# Patient Record
Sex: Female | Born: 1970
Health system: Southern US, Community
[De-identification: ages and names within clinical notes are randomized; demographics above are authoritative.]

## PROBLEM LIST (undated history)

## (undated) DIAGNOSIS — J411 Mucopurulent chronic bronchitis: Secondary | ICD-10-CM

## (undated) DIAGNOSIS — E282 Polycystic ovarian syndrome: Secondary | ICD-10-CM

## (undated) DIAGNOSIS — E785 Hyperlipidemia, unspecified: Secondary | ICD-10-CM

## (undated) DIAGNOSIS — K219 Gastro-esophageal reflux disease without esophagitis: Secondary | ICD-10-CM

## (undated) DIAGNOSIS — S82899A Other fracture of unspecified lower leg, initial encounter for closed fracture: Secondary | ICD-10-CM

## (undated) DIAGNOSIS — D649 Anemia, unspecified: Secondary | ICD-10-CM

## (undated) DIAGNOSIS — Z872 Personal history of diseases of the skin and subcutaneous tissue: Secondary | ICD-10-CM

## (undated) DIAGNOSIS — F419 Anxiety disorder, unspecified: Secondary | ICD-10-CM

## (undated) DIAGNOSIS — L299 Pruritus, unspecified: Secondary | ICD-10-CM

## (undated) DIAGNOSIS — T7840XA Allergy, unspecified, initial encounter: Secondary | ICD-10-CM

## (undated) DIAGNOSIS — K589 Irritable bowel syndrome without diarrhea: Secondary | ICD-10-CM

## (undated) DIAGNOSIS — M8580 Other specified disorders of bone density and structure, unspecified site: Secondary | ICD-10-CM

## (undated) DIAGNOSIS — N159 Renal tubulo-interstitial disease, unspecified: Secondary | ICD-10-CM

## (undated) DIAGNOSIS — M545 Low back pain: Secondary | ICD-10-CM

## (undated) DIAGNOSIS — J45909 Unspecified asthma, uncomplicated: Secondary | ICD-10-CM

## (undated) DIAGNOSIS — F32A Depression, unspecified: Secondary | ICD-10-CM

## (undated) DIAGNOSIS — Z124 Encounter for screening for malignant neoplasm of cervix: Principal | ICD-10-CM

## (undated) DIAGNOSIS — K649 Unspecified hemorrhoids: Secondary | ICD-10-CM

## (undated) DIAGNOSIS — F329 Major depressive disorder, single episode, unspecified: Secondary | ICD-10-CM

## (undated) DIAGNOSIS — J189 Pneumonia, unspecified organism: Secondary | ICD-10-CM

## (undated) DIAGNOSIS — L309 Dermatitis, unspecified: Secondary | ICD-10-CM

## (undated) HISTORY — DX: Anemia, unspecified: D64.9

## (undated) HISTORY — DX: Unspecified hemorrhoids: K64.9

## (undated) HISTORY — DX: Hyperlipidemia, unspecified: E78.5

## (undated) HISTORY — DX: Other specified disorders of bone density and structure, unspecified site: M85.80

## (undated) HISTORY — DX: Pruritus, unspecified: L29.9

## (undated) HISTORY — DX: Dermatitis, unspecified: L30.9

## (undated) HISTORY — DX: Irritable bowel syndrome, unspecified: K58.9

## (undated) HISTORY — PX: WISDOM TOOTH EXTRACTION: SHX21

## (undated) HISTORY — DX: Allergy, unspecified, initial encounter: T78.40XA

## (undated) HISTORY — DX: Renal tubulo-interstitial disease, unspecified: N15.9

## (undated) HISTORY — DX: Gastro-esophageal reflux disease without esophagitis: K21.9

## (undated) HISTORY — DX: Anxiety disorder, unspecified: F41.9

## (undated) HISTORY — DX: Other fracture of unspecified lower leg, initial encounter for closed fracture: S82.899A

## (undated) HISTORY — PX: HEMORRHOID SURGERY: SHX153

## (undated) HISTORY — DX: Low back pain: M54.5

## (undated) HISTORY — DX: Major depressive disorder, single episode, unspecified: F32.9

## (undated) HISTORY — PX: ANKLE SURGERY: SHX546

## (undated) HISTORY — DX: Polycystic ovarian syndrome: E28.2

## (undated) HISTORY — DX: Mucopurulent chronic bronchitis: J41.1

## (undated) HISTORY — DX: Unspecified asthma, uncomplicated: J45.909

## (undated) HISTORY — PX: EYE SURGERY: SHX253

## (undated) HISTORY — DX: Personal history of diseases of the skin and subcutaneous tissue: Z87.2

## (undated) HISTORY — DX: Encounter for screening for malignant neoplasm of cervix: Z12.4

## (undated) HISTORY — DX: Depression, unspecified: F32.A

---
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
## 2002-09-16 ENCOUNTER — Other Ambulatory Visit: Admission: RE | Admit: 2002-09-16 | Discharge: 2002-09-16 | Payer: Self-pay | Admitting: Gynecology

## 2003-10-05 ENCOUNTER — Other Ambulatory Visit: Admission: RE | Admit: 2003-10-05 | Discharge: 2003-10-05 | Payer: Self-pay | Admitting: Obstetrics and Gynecology

## 2004-10-10 ENCOUNTER — Other Ambulatory Visit: Admission: RE | Admit: 2004-10-10 | Discharge: 2004-10-10 | Payer: Self-pay | Admitting: Obstetrics and Gynecology

## 2004-12-13 ENCOUNTER — Encounter: Admission: RE | Admit: 2004-12-13 | Discharge: 2004-12-13 | Payer: Self-pay | Admitting: Family Medicine

## 2006-04-03 ENCOUNTER — Other Ambulatory Visit: Admission: RE | Admit: 2006-04-03 | Discharge: 2006-04-03 | Payer: Self-pay | Admitting: Obstetrics and Gynecology

## 2007-03-01 LAB — HM MAMMOGRAPHY: HM Mammogram: NORMAL

## 2007-04-25 ENCOUNTER — Other Ambulatory Visit: Admission: RE | Admit: 2007-04-25 | Discharge: 2007-04-25 | Payer: Self-pay | Admitting: Gynecology

## 2008-05-07 ENCOUNTER — Other Ambulatory Visit: Admission: RE | Admit: 2008-05-07 | Discharge: 2008-05-07 | Payer: Self-pay | Admitting: Obstetrics and Gynecology

## 2008-05-21 LAB — CONVERTED CEMR LAB: Pap Smear: NORMAL

## 2009-02-17 ENCOUNTER — Ambulatory Visit: Payer: Self-pay | Admitting: *Deleted

## 2009-02-17 DIAGNOSIS — K649 Unspecified hemorrhoids: Secondary | ICD-10-CM | POA: Insufficient documentation

## 2009-02-17 DIAGNOSIS — L732 Hidradenitis suppurativa: Secondary | ICD-10-CM

## 2009-02-17 DIAGNOSIS — J309 Allergic rhinitis, unspecified: Secondary | ICD-10-CM

## 2009-02-17 DIAGNOSIS — F419 Anxiety disorder, unspecified: Secondary | ICD-10-CM | POA: Insufficient documentation

## 2009-02-17 DIAGNOSIS — H1045 Other chronic allergic conjunctivitis: Secondary | ICD-10-CM

## 2009-02-17 DIAGNOSIS — E782 Mixed hyperlipidemia: Secondary | ICD-10-CM

## 2009-02-17 DIAGNOSIS — J3089 Other allergic rhinitis: Secondary | ICD-10-CM | POA: Insufficient documentation

## 2009-02-17 DIAGNOSIS — F329 Major depressive disorder, single episode, unspecified: Secondary | ICD-10-CM

## 2009-02-17 DIAGNOSIS — H1010 Acute atopic conjunctivitis, unspecified eye: Secondary | ICD-10-CM | POA: Insufficient documentation

## 2009-02-17 DIAGNOSIS — K589 Irritable bowel syndrome without diarrhea: Secondary | ICD-10-CM

## 2009-02-17 DIAGNOSIS — J40 Bronchitis, not specified as acute or chronic: Secondary | ICD-10-CM

## 2009-02-17 DIAGNOSIS — F32A Depression, unspecified: Secondary | ICD-10-CM

## 2009-02-17 HISTORY — DX: Anxiety disorder, unspecified: F41.9

## 2009-02-17 HISTORY — DX: Depression, unspecified: F32.A

## 2009-03-18 ENCOUNTER — Ambulatory Visit: Payer: Self-pay | Admitting: *Deleted

## 2009-03-18 LAB — CONVERTED CEMR LAB
AST: 24 units/L (ref 0–37)
Albumin: 3.9 g/dL (ref 3.5–5.2)
Alkaline Phosphatase: 55 units/L (ref 39–117)
BUN: 13 mg/dL (ref 6–23)
Basophils Relative: 0.3 % (ref 0.0–3.0)
Chloride: 107 meq/L (ref 96–112)
Cholesterol: 182 mg/dL (ref 0–200)
Creatinine, Ser: 0.6 mg/dL (ref 0.4–1.2)
Eosinophils Absolute: 0.1 10*3/uL (ref 0.0–0.7)
Eosinophils Relative: 2.6 % (ref 0.0–5.0)
GFR calc non Af Amer: 117.65 mL/min (ref >60)
Glucose, Bld: 78 mg/dL (ref 70–99)
HCT: 37.1 % (ref 36.0–46.0)
HDL: 63.8 mg/dL (ref >39.00)
Hemoglobin: 12.6 g/dL (ref 12.0–15.0)
LDL Cholesterol: 109 mg/dL — ABNORMAL HIGH (ref 0–99)
Lymphs Abs: 1.2 10*3/uL (ref 0.7–4.0)
MCHC: 33.9 g/dL (ref 30.0–36.0)
MCV: 88.7 fL (ref 78.0–100.0)
Monocytes Absolute: 0.3 10*3/uL (ref 0.1–1.0)
Neutro Abs: 3.5 10*3/uL (ref 1.4–7.7)
Neutrophils Relative %: 68.1 % (ref 43.0–77.0)
Platelets: 261 10*3/uL (ref 150.0–400.0)
Potassium: 4.2 meq/L (ref 3.5–5.1)
RBC: 4.18 M/uL (ref 3.87–5.11)
RDW: 12.7 % (ref 11.5–14.6)
Sodium: 139 meq/L (ref 135–145)
TSH: 1.77 microintl units/mL (ref 0.35–5.50)
Total Bilirubin: 0.7 mg/dL (ref 0.3–1.2)
Total CHOL/HDL Ratio: 3
Total Protein: 7.3 g/dL (ref 6.0–8.3)
Triglycerides: 47 mg/dL (ref 0.0–149.0)
VLDL: 9.4 mg/dL (ref 0.0–40.0)
WBC: 5.1 10*3/uL (ref 4.5–10.5)

## 2009-03-30 ENCOUNTER — Telehealth (INDEPENDENT_AMBULATORY_CARE_PROVIDER_SITE_OTHER): Payer: Self-pay | Admitting: *Deleted

## 2009-04-15 ENCOUNTER — Telehealth: Payer: Self-pay | Admitting: Internal Medicine

## 2009-04-15 ENCOUNTER — Ambulatory Visit: Payer: Self-pay | Admitting: Internal Medicine

## 2009-04-15 DIAGNOSIS — H1010 Acute atopic conjunctivitis, unspecified eye: Secondary | ICD-10-CM | POA: Insufficient documentation

## 2009-04-15 DIAGNOSIS — H109 Unspecified conjunctivitis: Secondary | ICD-10-CM

## 2009-04-15 DIAGNOSIS — H6590 Unspecified nonsuppurative otitis media, unspecified ear: Secondary | ICD-10-CM | POA: Insufficient documentation

## 2009-04-25 ENCOUNTER — Telehealth: Payer: Self-pay | Admitting: Internal Medicine

## 2009-04-28 ENCOUNTER — Encounter: Payer: Self-pay | Admitting: Internal Medicine

## 2009-05-20 LAB — CONVERTED CEMR LAB: Pap Smear: NORMAL

## 2009-09-19 ENCOUNTER — Ambulatory Visit: Payer: Self-pay | Admitting: Internal Medicine

## 2009-12-24 DIAGNOSIS — S82899A Other fracture of unspecified lower leg, initial encounter for closed fracture: Secondary | ICD-10-CM

## 2009-12-24 HISTORY — DX: Other fracture of unspecified lower leg, initial encounter for closed fracture: S82.899A

## 2010-03-07 ENCOUNTER — Ambulatory Visit: Payer: Self-pay | Admitting: Internal Medicine

## 2010-03-29 ENCOUNTER — Telehealth: Payer: Self-pay | Admitting: Internal Medicine

## 2010-04-03 ENCOUNTER — Telehealth: Payer: Self-pay | Admitting: Internal Medicine

## 2010-04-07 ENCOUNTER — Ambulatory Visit: Payer: Self-pay | Admitting: Internal Medicine

## 2010-04-07 DIAGNOSIS — S82899A Other fracture of unspecified lower leg, initial encounter for closed fracture: Secondary | ICD-10-CM | POA: Insufficient documentation

## 2010-04-10 ENCOUNTER — Telehealth: Payer: Self-pay | Admitting: Internal Medicine

## 2010-04-11 ENCOUNTER — Encounter: Payer: Self-pay | Admitting: Internal Medicine

## 2010-04-11 ENCOUNTER — Ambulatory Visit: Payer: Self-pay | Admitting: Internal Medicine

## 2010-04-20 ENCOUNTER — Telehealth: Payer: Self-pay | Admitting: Internal Medicine

## 2010-05-23 LAB — CONVERTED CEMR LAB: Pap Smear: NORMAL

## 2010-11-20 ENCOUNTER — Ambulatory Visit (HOSPITAL_BASED_OUTPATIENT_CLINIC_OR_DEPARTMENT_OTHER): Admission: RE | Admit: 2010-11-20 | Discharge: 2010-11-20 | Payer: Self-pay | Admitting: Internal Medicine

## 2010-11-20 ENCOUNTER — Ambulatory Visit: Payer: Self-pay | Admitting: Internal Medicine

## 2010-11-20 ENCOUNTER — Ambulatory Visit: Payer: Self-pay | Admitting: Radiology

## 2010-12-05 ENCOUNTER — Telehealth (INDEPENDENT_AMBULATORY_CARE_PROVIDER_SITE_OTHER): Payer: Self-pay | Admitting: *Deleted

## 2010-12-05 ENCOUNTER — Telehealth: Payer: Self-pay | Admitting: Internal Medicine

## 2010-12-07 ENCOUNTER — Ambulatory Visit: Payer: Self-pay | Admitting: Internal Medicine

## 2011-01-19 ENCOUNTER — Encounter: Payer: Self-pay | Admitting: Internal Medicine

## 2011-01-21 LAB — CONVERTED CEMR LAB
CO2: 24 meq/L (ref 19–32)
Calcium, Total (PTH): 9.7 mg/dL (ref 8.4–10.5)
Calcium: 9.7 mg/dL (ref 8.4–10.5)
Creatinine, Ser: 0.67 mg/dL (ref 0.40–1.20)
Glucose, Bld: 73 mg/dL (ref 70–99)
PTH: 29.6 pg/mL (ref 14.0–72.0)
Potassium: 4.2 meq/L (ref 3.5–5.3)
Sodium: 139 meq/L (ref 135–145)
TSH: 2.117 microintl units/mL (ref 0.350–4.500)
Vit D, 1,25-Dihydroxy: 28 — ABNORMAL LOW (ref 30–89)

## 2011-01-22 ENCOUNTER — Ambulatory Visit (INDEPENDENT_AMBULATORY_CARE_PROVIDER_SITE_OTHER)
Admission: RE | Admit: 2011-01-22 | Discharge: 2011-01-22 | Payer: Self-pay | Source: Home / Self Care | Attending: Internal Medicine | Admitting: Internal Medicine

## 2011-01-22 ENCOUNTER — Encounter: Payer: Self-pay | Admitting: Internal Medicine

## 2011-01-22 DIAGNOSIS — J45909 Unspecified asthma, uncomplicated: Secondary | ICD-10-CM

## 2011-01-23 NOTE — Assessment & Plan Note (Signed)
Summary: discuss broken ankle/dt   Vital Signs:  Patient profile:   40 year old female Menstrual status:  regular Height:      60.5 inches O2 Sat:      99 % on Room air Temp:     98.0 degrees F oral Pulse rate:   63 / minute Pulse rhythm:   regular Resp:     16 per minute BP sitting:   120 / 68  (left arm) Cuff size:   regular  Vitals Entered By: Mervin Kung CMA (April 07, 2010 2:41 PM)  O2 Flow:  Room air CC: room2   Follow up to discuss broken left ankle. Is Patient Diabetic? No   Primary Care Provider:  Dondra Spry DO  CC:  room2   Follow up to discuss broken left ankle.Marland Kitchen  History of Present Illness: 40 y/o white female for hosp f/u she was seen for L ankle fracture b day party - roller skating, she severely twisted her ankle she was seen by ortho.  she has surgical repair - plate and 8 screws  pt sent to PCP for w/u to rule out osteopenia / osteoporosis   Allergies (verified): No Known Drug Allergies  Past History:  Risk Factors: Exercise: no (02/17/2009)  Past Medical History: Allergic rhinitis allergic conjunctivitis IBS - fiber / diet controlled   sees gyn  hemorrhoids  hx of Hidradenitis suppurativa    recurrent bronchitis  anxiety Hyperlipidemia - diet controlled Left ankle fracture - 03/2010  Past Surgical History: hemorrhoidectomy  2 supernumerary nipples  removed  surgery for Hidradenitis suppurativa   Left ankle surgery - plate and 8 screws  Family History: Grandmother with ovarian cancer Family History of Arthritis Fam hx of Osteoporosis - no  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   Lungs:  normal respiratory effort and normal breath sounds.   Heart:  normal rate, regular rhythm, and no gallop.   Msk:  left ankle in cast   Impression &  Recommendations:  Problem # 1:  FRACTURE, ANKLE (ICD-824.8) 40 y/o with left ankle fx.  non high impact.   arrange dexa scan.  check vit d and intact parathyroid level take calcium and vit d supplement Orders: T-Basic Metabolic Panel [id number]) T-TSH [phone]) T- * Misc. Laboratory test [id number])  Complete Medication List: 1)  Flonase 50 Mcg/act Susp (Fluticasone propionate) .... One spray each nostril every other day. 2)  Multivitamins Caps (Multiple vitamin) .... Take 1 tablet by mouth once a day 3)  Calcium-magnesium 300-300 Mg Tabs (Calcium-magnesium) .... Take two tablets once daily 4)  Paroxetine Hcl 10 Mg Tabs (Paroxetine hcl) .... One tab by mouth once daily 5)  Quercetin 250 Mg Tabs (Quercetin) .... 2 tablets by mouth once daily 6)  Bee Pollen 500 Mg Chew (Bee pollen) .Marland Kitchen.. 18 granules once daily by mouth 7)  Ecotrin 325 Mg Tbec (Aspirin) .... Take 1 tablet by mouth once a day 8)  Percocet 5-325 Mg Tabs (Oxycodone-acetaminophen) .Marland Kitchen.. 1 every six hours as needed 9)  Ibuprofen 200 Mg Tabs (Ibuprofen) .... As needed 10)  Vitamin D 1000 Unit Tabs (cholecalciferol)  .... Take 1 tablet by mouth once a day  Patient Instructions: 1)  Schedule screening DEXA scan at [facility].  Current Allergies (reviewed today): No known allergies

## 2011-01-23 NOTE — Progress Notes (Signed)
Summary: Broken ankle [facility] 4/2/11ER visit  Phone Note Call from Patient Call back at Home Phone [phone]   Caller: Patient Call For: D. Thomos Lemons DO Reason for Call: Referral Summary of Call: Pt had surgery at [facility] for broken left ankle, the surgeon Dr Alben Spittle recommended she have an evaluation by Dr Kendra Opitz for osteoporsis. The pt is inquiring as to whether she needs to be seen by you first since you are her primary care physician. Pls advise if pt needs an appt with you. Initial call taken by: Lannette Donath,  March 29, 2010 3:29 PM  Follow-up for Phone Call        is Dr. Kendra Opitz rheumatologist? Follow-up by: D. Thomos Lemons DO,  March 29, 2010 10:02 PM  Additional Follow-up for Phone Call Additional follow up Details #1::        [facility]'s [facility]. She is sports medicine doc, pt has a follow up appt with Dr Charlotta Newton on Tues 04/04/10 Diane Tomerlin  March 30, 2010 8:39 AM Scheduled appt for 04/07/10 Diane Tomerlin  April 03, 2010 10:29 AM    Additional Follow-up for Phone Call Additional follow up Details #2::    yes, I suggest ov with me Follow-up by: D. Thomos Lemons DO,  April 02, 2010 8:21 PM

## 2011-01-23 NOTE — Miscellaneous (Signed)
Summary: BONE DENSITY  Clinical Lists Changes  Orders: Added new Test order of T-Bone Densitometry (77080) - Signed Added new Test order of T-Lumbar Vertebral Assessment (77082) - Signed 

## 2011-01-23 NOTE — Assessment & Plan Note (Signed)
Summary: 6 month follow up/mmhf   Vital Signs:  Patient profile:   40 year old female Menstrual status:  regular Height:      60.5 inches Weight:      127.50 pounds BMI:     24.58 O2 Sat:      99 % on Room air Temp:     97.8 degrees F oral Pulse rate:   60 / minute Pulse rhythm:   regular Resp:     16 per minute BP sitting:   98 / 60  (right arm) Cuff size:   regular  Vitals Entered By: Glendell Docker CMA (March 07, 2010 2:23 PM)  O2 Flow:  Room air CC: Rm 2- 6 Month Follow up   Primary Care Provider:  Dondra Spry DO  CC:  Rm 2- 6 Month Follow up.  History of Present Illness: 40 y/o white female for f/u since prev visit - seen by ent right ear better.  she thinks minimal hearing loss - not significant  hx of anxiety - stable.  reviewed hx of when and why meds started.  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   Lungs:  normal respiratory effort and normal breath sounds.   Heart:  normal rate, regular rhythm, and no gallop.   Psych:  normally interactive, good eye contact, not anxious appearing, and not depressed appearing.     Impression & Recommendations:  Problem # 1:  OTITIS MEDIA, SEROUS (ICD-381.4) Assessment Improved seen by ent.  received second course of abx.   no tubes needed.  minimal residual hearing loss.  Problem # 2:  ANXIETY (ICD-300.00) her symptoms started after birth of twins.  she exp irritability.  symptoms much better since restarting paxil.  no wt gain.  no change in libido.  continue meds.  Her updated medication list for this problem includes:    Paroxetine Hcl 10 Mg Tabs (Paroxetine hcl) ..... One tab by mouth once daily  Problem # 3:  ALLERGIC RHINITIS (ICD-477.9) ENT recommended allergy drops but they are cost prohibitive.  she is going to try bee pollen supplement.  she uses claritin and flonase regularly.  The following medications were removed from the medication list:    Claritin 10 Mg Tabs (Loratadine) ..... One tab by mouth once daily as needed Her updated medication list for this problem includes:    Flonase 50 Mcg/act Susp (Fluticasone propionate) ..... One spray each nostril every other day.  Complete Medication List: 1)  Flonase 50 Mcg/act Susp (Fluticasone propionate) .... One spray each nostril every other day. 2)  Multivitamins Caps (Multiple vitamin) .... Take 1 tablet by mouth once a day 3)  Calcium-magnesium 300-300 Mg Tabs (Calcium-magnesium) .... Take two tablets once daily 4)  Paroxetine Hcl 10 Mg Tabs (Paroxetine hcl) .... One tab by mouth once daily 5)  Quercetin  250 Mg Tabs (Quercetin) .... 2 tablets by mouth once daily 6)  Bee Pollen 500 Mg Chew (Bee pollen) .Marland Kitchen.. 18 granules once daily by mouth  Other Orders: Tdap => 98yrs IM (14782) Admin 1st Vaccine (95621)  Patient Instructions: 1)  Please schedule a follow-up appointment in 1 year. Prescriptions: FLONASE 50 MCG/ACT SUSP (FLUTICASONE PROPIONATE) one spray each nostril every other day.  #1 x 5   Entered and Authorized by:   D. Thomos Lemons DO   Signed by:   D. Thomos Lemons DO on 03/07/2010   Method used:   Electronically to        [facility] [phone]* (retail)       [address]       [place], [place]  [address]       Ph: [phone]       Fax: [phone]   RxID:   [id number] PAROXETINE HCL 10 MG TABS (PAROXETINE HCL) one tab by mouth once daily  #90 x 3   Entered and Authorized by:   D. Thomos Lemons DO   Signed by:   D. Thomos Lemons DO on 03/07/2010   Method used:   Electronically to        [facility] #[id number]* (retail)       [address]       [place], [place]  [address]       Ph: [phone]       Fax: [phone]   RxID:   [id number]   Current Allergies (reviewed today): No known allergies    Immunizations Administered:  Tetanus Vaccine:    Vaccine Type: Tdap    Site: left deltoid    Mfr: GlaxoSmithKline    Dose: 0.5 ml    Route: IM    Given by: Glendell Docker CMA    Exp. Date: 02/18/2012    Lot #: [id number]    VIS given: 11/11/07 version given March 07, 2010.

## 2011-01-23 NOTE — Progress Notes (Signed)
Summary: Lab Results  Phone Note Outgoing Call   Summary of Call: call pt - vit D level slightly low.  I suggest she take vit d 3 2000 units once daily (otc).  othe blood work normal - TSH,  intact parathyroid hormone Initial call taken by: D. Thomos Lemons DO,  April 10, 2010 2:12 PM  Follow-up for Phone Call        patient has been advised per Dr Artist Pais instructions Follow-up by: Glendell Docker CMA,  April 10, 2010 3:43 PM

## 2011-01-23 NOTE — Progress Notes (Signed)
Summary: BD results  Phone Note Outgoing Call   Summary of Call: call pt - bone density shows osteopenia.  continue taking calcium and vit d supplement as directed Initial call taken by: D. Thomos Lemons DO,  April 20, 2010 8:34 AM  Follow-up for Phone Call        LM w/ family member for Pt to CB. Follow-up by: Payton Spark CMA,  April 20, 2010 8:49 AM  Additional Follow-up for Phone Call Additional follow up Details #1::        patient notified. Additional Follow-up by: Lucious Groves,  April 21, 2010 11:14 AM    +

## 2011-01-23 NOTE — Assessment & Plan Note (Signed)
Summary: lung congestion cough wheezing/mhf   Vital Signs:  Patient profile:   40 year old female Menstrual status:  regular Height:      60.5 inches Weight:      131.50 pounds BMI:     25.35 O2 Sat:      97 % on Room air Temp:     98.3 degrees F oral Pulse rate:   66 / minute Resp:     20 per minute BP sitting:   110 / 70  (right arm) Cuff size:   regular  Vitals Entered By: Glendell Docker CMA (November 20, 2010 3:58 PM)  O2 Flow:  Room air CC: Cough Is Patient Diabetic? No Pain Assessment Patient in pain? no        Primary Care Cortlynn Hollinsworth:  Dondra Spry DO  CC:  Cough.  History of Present Illness: 40 y/o  c/o cough and chest congestion for the past 3 weeks, taken over the counter, Mucinex and Delsym with no relief, she is hearing crackles in her ears, main part of the day her cough is worse, denies temperature, and has not felt feverish  Preventive Screening-Counseling & Management  Alcohol-Tobacco     Smoking Status: never  Allergies (verified): No Known Drug Allergies  Past History:  Past Medical History: Allergic rhinitis allergic conjunctivitis IBS - fiber / diet controlled    sees gyn   hemorrhoids  hx of Hidradenitis suppurativa    recurrent bronchitis  anxiety Hyperlipidemia - diet controlled Left ankle fracture - 03/2010  Past Surgical History: hemorrhoidectomy  2 supernumerary nipples  removed  surgery for Hidradenitis suppurativa   Left ankle surgery - plate and 8 screws    Physical Exam  General:  alert, well-developed, and well-nourished.   Ears:  R ear normal and L ear normal.   Nose:  mucosal erythema and mucosal edema.   Mouth:  pharyngeal erythema.   Neck:  No deformities, masses, or tenderness noted. Lungs:  normal respiratory effort.  bilateral exp wheezing Heart:  normal rate, regular rhythm, and no gallop.     Impression & Recommendations:  Problem # 1:  BRONCHITIS (ICD-490)  Orders: T-2 View CXR, Same Day  (71020.5TC)  Her updated medication list for this problem includes:    Cefuroxime Axetil 500 Mg Tabs (Cefuroxime axetil) ..... One by mouth two times a day    Azithromycin 250 Mg Tabs (Azithromycin) .Marland Kitchen... 2 tabs on day one, then one by mouth once daily    Symbicort 160-4.5 Mcg/act Aero (Budesonide-formoterol fumarate) .Marland Kitchen... 2 puffs two times a day  Take antibiotics and other medications as directed. Encouraged to push clear liquids, get enough rest, and take acetaminophen as needed. To be seen in 5-7 days if no improvement, sooner if worse.  Complete Medication List: 1)  Flonase 50 Mcg/act Susp (Fluticasone propionate) .... One spray each nostril every other day. 2)  Multivitamins Caps (Multiple vitamin) .... Take 1 tablet by mouth once a day 3)  Calcium-magnesium 300-300 Mg Tabs (Calcium-magnesium) .... Take two tablets once daily 4)  Paroxetine Hcl 10 Mg Tabs (Paroxetine hcl) .... One tab by mouth once daily 5)  Quercetin 250 Mg Tabs (Quercetin) .... 2 tablets by mouth once daily 6)  Bee Pollen 500 Mg Chew (Bee pollen) .Marland Kitchen.. 18 granules once daily by mouth 7)  Cefuroxime Axetil 500 Mg Tabs (Cefuroxime axetil) .... One by mouth two times a day 8)  Azithromycin 250 Mg Tabs (Azithromycin) .... 2 tabs on day one, then one by  mouth once daily 9)  Prednisone 10 Mg Tabs (Prednisone) .... 3 tabs by mouth once daily x 3 days, 2 tabs by mouth once daily x 3 days, 1 tab by mouth once daily x 3 days 10)  Symbicort 160-4.5 Mcg/act Aero (Budesonide-formoterol fumarate) .... 2 puffs two times a day  Patient Instructions: 1)  Please schedule a follow-up appointment in 2 months. Prescriptions: PREDNISONE 10 MG TABS (PREDNISONE) 3 tabs by mouth once daily x 3 days, 2 tabs by mouth once daily x 3 days, 1 tab by mouth once daily x 3 days  #18 x 0   Entered and Authorized by:   D. Thomos Lemons DO   Signed by:   D. Thomos Lemons DO on 11/20/2010   Method used:   Electronically to        [facility]  [phone]* (retail)       [address]       [place], [place]  [address]       Ph: [phone]       Fax: [phone]   RxID:   [id number] AZITHROMYCIN 250 MG TABS (AZITHROMYCIN) 2 tabs on day one, then one by mouth once daily  #6 x 0   Entered and Authorized by:   D. Thomos Lemons DO   Signed by:   D. Thomos Lemons DO on 11/20/2010   Method used:   Electronically to        [facility] [phone]* (retail)       [address]       [place], [place]  [address]       Ph: [phone]       Fax: [phone]   RxID:   [id number] CEFUROXIME AXETIL 500 MG TABS (CEFUROXIME AXETIL) one by mouth two times a day  #20 x 0   Entered and Authorized by:   D. Thomos Lemons DO   Signed by:   D. Thomos Lemons DO on 11/20/2010   Method used:   Electronically to        [facility] #[id number]* (retail)       [address]       [place], [place]  [address]       Ph: [phone]       Fax: [phone]   RxID:   [id number]    Orders Added: 1)  T-2 View CXR, Same Day [71020.5TC] 2)  Est. Patient Level III [58527]     Current Allergies (reviewed today): No known allergies     Preventive Care Screening  Pap Smear:    Date:  05/23/2010    Results:  normal

## 2011-01-23 NOTE — Progress Notes (Signed)
  Phone Note From Other Clinic   Summary of Call: received call from Dr. Kendra Opitz (PM[facility]).  pt usually works up pt for osteoporosis with non high impact injury. will plan on DEXA and w/u for secondary osteoporosis at next OV Initial call taken by: D. Thomos Lemons DO,  April 03, 2010 3:04 PM

## 2011-01-25 NOTE — Assessment & Plan Note (Signed)
Summary: still wheezing/mhf   Vital Signs:  Patient profile:   40 year old female Menstrual status:  regular Height:      60.5 inches Weight:      133 pounds BMI:     25.64 O2 Sat:      100 % on Room air Temp:     98.4 degrees F oral Pulse rate:   70 / minute Resp:     16 per minute BP sitting:   90 / 64  (right arm) Cuff size:   regular  Vitals Entered By: Glendell Docker CMA (December 07, 2010 1:22 PM)  O2 Flow:  Room air CC: Wheezing unresolved Is Patient Diabetic? No Pain Assessment Patient in pain? no      Comments discuss alternatives to managing her asthma   Primary Care Provider:  Dondra Spry DO  CC:  Wheezing unresolved.  History of Present Illness: 40 y/o white female for f/u pt wheezing much better but not completely resolved  previous allergy testing - 27 out of 50 environmental sensitivities  allergist recommended allergy drops but she decided on using bee pollen    Preventive Screening-Counseling & Management  Alcohol-Tobacco     Smoking Status: never  Allergies (verified): No Known Drug Allergies  Past History:  Past Medical History: Allergic rhinitis allergic conjunctivitis IBS - fiber / diet controlled    sees gyn    hemorrhoids  hx of Hidradenitis suppurativa    recurrent bronchitis  anxiety Hyperlipidemia - diet controlled Left ankle fracture - 03/2010  Past Surgical History: hemorrhoidectomy  2 supernumerary nipples  removed  surgery for Hidradenitis suppurativa    Left ankle surgery - plate and 8 screws    Family History: Grandmother with ovarian cancer Family History of Arthritis Fam hx of Osteoporosis - no    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   Lungs:  normal respiratory effort and normal breath sounds.   Heart:  normal rate,  regular rhythm, and no gallop.     Impression & Recommendations:  Problem # 1:  BRONCHITIS (ICD-490) Assessment Improved I suspect pt has underlying asthma symbicort cost prohibitive start qvar - maintenance inhaler  The following medications were removed from the medication list:    Cefuroxime Axetil 500 Mg Tabs (Cefuroxime axetil) ..... One by mouth two times a day    Azithromycin 250 Mg Tabs (Azithromycin) .Marland Kitchen... 2 tabs on day one, then one by mouth once daily Her updated medication list for this problem includes:    Qvar 80 Mcg/act Aers (Beclomethasone dipropionate) .Marland Kitchen... 2 puffs two times a day  Complete Medication List: 1)  Flonase 50 Mcg/act Susp (Fluticasone propionate) .... One spray each nostril every other day. 2)  Multivitamins Caps (Multiple vitamin) .... Take 1 tablet by mouth once a day 3)  Calcium-magnesium 300-300 Mg Tabs (Calcium-magnesium) .... Take two tablets once daily 4)  Paroxetine Hcl 10 Mg Tabs (Paroxetine hcl) .... One tab by mouth once daily 5)  Quercetin 250 Mg Tabs (Quercetin) .... 2 tablets by mouth once daily 6)  Bee Pollen 500 Mg Chew (Bee pollen) .Marland Kitchen.. 18 granules once daily by mouth 7)  Qvar 80 Mcg/act Aers (Beclomethasone dipropionate) .... 2 puffs two times a day  Patient Instructions: 1)  Please schedule a follow-up appointment in 4 months. 2)  Keep  next appointment in 2 months for spirometry Prescriptions: QVAR 80 MCG/ACT AERS (BECLOMETHASONE DIPROPIONATE) 2 puffs two times a day  #3 x 1   Entered and Authorized by:   D. Thomos Lemons DO   Signed by:   D. Thomos Lemons DO on 12/07/2010   Method used:   Electronically to        [facility] #[id number]* (retail)       [address]       [place], [place]  [address]       Ph: [phone]       Fax: [phone]   RxID:   [id number] QVAR 80 MCG/ACT AERS (BECLOMETHASONE DIPROPIONATE) 2 puffs two times a day  #1 x 5   Entered and Authorized by:   D. Thomos Lemons DO   Signed by:   D. Thomos Lemons DO on 12/07/2010   Method used:   Electronically to        [facility] #[id number]* (retail)       [address]       [place], [place]  [address]       Ph: [phone]       Fax: [phone]   RxID:   [id number]    Orders Added: 1)  Est. Patient Level III [[address]]    Current Allergies (reviewed today): No known allergies

## 2011-01-25 NOTE — Progress Notes (Signed)
Summary: STILL WHEEZING AND NEEDS RX CALLED IN   Phone Note Call from Patient Call back at [facility] Phone [phone]   Caller: Patient Call For: Tricia Potts  Summary of Call: GOT A SAMPLE OF SYMBICORT 160/4.5.  THE SAMPLES IS DONE AND SHE NEEDS AN RX CALLED IN TO [facility] [address]  SHE IS STILL WHEEZING SHOULD SHE BE  Initial call taken by: Roselle Locus,  December 05, 2010 10:41 AM  Follow-up for Phone Call        see rx for symbicort.  plz have pt pick up coupon. if she is still wheezing - I suggest OV within 1 week Follow-up by: D. Thomos Lemons DO,  December 05, 2010 12:57 PM  Additional Follow-up for Phone Call Additional follow up Details #1::        call returned to patient at [phone], she has been informed per Dr Artist Pais instructions.  Coupon for Symbicort left at front desk for patient pick up Additional Follow-up by: Glendell Docker CMA,  December 05, 2010 1:44 PM    New/Updated Medications: SYMBICORT 160-4.5 MCG/ACT AERO (BUDESONIDE-FORMOTEROL FUMARATE) 2 puffs two times a day Prescriptions: SYMBICORT 160-4.5 MCG/ACT AERO (BUDESONIDE-FORMOTEROL FUMARATE) 2 puffs two times a day  #1 x 3   Entered and Authorized by:   D. Thomos Lemons DO   Signed by:   D. Thomos Lemons DO on 12/05/2010   Method used:   Electronically to        [facility] #[id number]* (retail)       [address]       [place], [place]  [address]       Ph: [phone]       Fax: [phone]   RxID:   [id number]

## 2011-01-25 NOTE — Progress Notes (Signed)
  Phone Note Call from Patient Call back at [facility] Phone [phone]   Caller: Patient Call For: D. Thomos Lemons DO Summary of Call: patient called and left voice message stating the Symbicort cost $200 to fil. Her message states she has been without taking anything for the past 41 years and she may have to continue to do so. She would like to know if there is something else that is less expensive that she could use. Her message states she took her last dose of Symbicort this morning.   Initial call taken by: Glendell Docker CMA,  December 05, 2010 4:40 PM  Follow-up for Phone Call        call returned to patient at [phone], no answer. A detailed voice message was left informing patiemt samples of Symbicort left at front desk for patient pick up. Message was left for patient to return call. Follow-up by: Glendell Docker CMA,  December 05, 2010 4:53 PM     Appended Document: Symbicort Cost    Phone Note Outgoing Call   Call placed by: Glendell Docker CMA,  December 05, 2010 4:55 PM Call placed to: Patient Summary of Call: awaiting return call from patient Initial call taken by: Glendell Docker CMA,  December 05, 2010 4:55 PM  Follow-up for Phone Call        call placed to patient at [phone], no answer.  A detailed voice message was left for patient to return call regarding status of inhaler.  Call placed to patients insurance company and [employer] regarding patients prescription. I was informed by [facility] CSR-that patient has deductible of $1,000 and out of pocket cost of $1500, which 503 has been met.  Deandra stated it did not matter what patient would currently be prescribed she would have to pay of out pocket until her deductible was met.  Follow-up by: Glendell Docker CMA,  December 06, 2010 1:03 PM  Additional Follow-up for Phone Call Additional follow up Details #1::        patient returned call and stated that she has a scheduled  a follow up appointment  with Dr Artist Pais for Thursday at 1:15  to discuss her Asthma.  Additional Follow-up by: Glendell Docker CMA,  December 06, 2010 1:34 PM

## 2011-02-14 NOTE — Assessment & Plan Note (Signed)
Summary: 2 mon f/u/hea   Vital Signs:  Patient profile:   40 year old female Menstrual status:  regular Height:      60.5 inches Weight:      131 pounds BMI:     25.25 O2 Sat:      100 % on Room air Temp:     97.8 degrees F oral Pulse rate:   68 / minute Resp:     16 per minute BP sitting:   112 / 70  (right arm) Cuff size:   regular  Vitals Entered By: Glendell Docker CMA (January 22, 2011 10:18 AM)  O2 Flow:  Room air CC: 2 Month Followup , asthma Is Patient Diabetic? No Pain Assessment Patient in pain? no      Comments no concerns, need a rx to [facility] at [facility] for Symbicort   Primary Care Provider:  Dondra Spry DO  CC:  2 Month Followup  and asthma.  History of Present Illness:       This is a 40 year old female who presents with asthma.  The patient denies shortness of breath, chest tightness, and wheezing.  Symptoms appear triggered by URI.  Previous effective treatment includes ICS + LABA.    Preventive Screening-Counseling & Management  Alcohol-Tobacco     Smoking Status: never  Allergies (verified): No Known Drug Allergies  Past History:  Past Medical History: Allergic rhinitis allergic conjunctivitis IBS - fiber / diet controlled    sees gyn    hemorrhoids  hx of Hidradenitis suppurativa     recurrent bronchitis  anxiety Hyperlipidemia - diet controlled Left ankle fracture - 03/2010  Past Surgical History: hemorrhoidectomy  2 supernumerary nipples  removed  surgery for Hidradenitis suppurativa     Left ankle surgery - plate and 8 screws    Family History: Grandmother with ovarian cancer Family History of Arthritis Fam hx of Osteoporosis - no     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  Physical Exam  General:  alert, well-developed,  and well-nourished.   Ears:  R ear normal and L ear normal.   Mouth:  pharynx pink and moist.   Lungs:  normal respiratory effort and normal breath sounds.   Heart:  normal rate, regular rhythm, and no gallop.     Impression & Recommendations:  Problem # 1:  ASTHMA (ICD-493.90) Assessment Improved  continue symbicort. trial of zafirlukast  The following medications were removed from the medication list:    Qvar 80 Mcg/act Aers (Beclomethasone dipropionate) .Marland Kitchen... 2 puffs two times a day Her updated medication list for this problem includes:    Symbicort 160-4.5 Mcg/act Aero (Budesonide-formoterol fumarate) ..... Inhale 1 puffs  by mouth two times a day    Zafirlukast 20 Mg Tabs (Zafirlukast) ..... One by mouth two times a day  Orders: Spirometry w/Graph (16109)  Pulmonary Functions Reviewed: O2 sat: 100 (01/22/2011)  Complete Medication List: 1)  Flonase 50 Mcg/act Susp (Fluticasone propionate) .... One spray each nostril every other day. 2)  Multivitamins Caps (Multiple vitamin) .... Take 1 tablet by mouth once a day 3)  Calcium-magnesium 300-300 Mg Tabs (Calcium-magnesium) .... Take two tablets once daily 4)  Paroxetine Hcl 10 Mg Tabs (Paroxetine hcl) .... One tab by  mouth once daily 5)  Quercetin 250 Mg Tabs (Quercetin) .... 2 tablets by mouth once daily 6)  Bee Pollen 500 Mg Chew (Bee pollen) .Marland Kitchen.. 18 granules once daily by mouth 7)  Symbicort 160-4.5 Mcg/act Aero (Budesonide-formoterol fumarate) .... Inhale 1 puffs  by mouth two times a day 8)  Zafirlukast 20 Mg Tabs (Zafirlukast) .... One by mouth two times a day  Patient Instructions: 1)  Please schedule a follow-up appointment in 9 months Prescriptions: ZAFIRLUKAST 20 MG TABS (ZAFIRLUKAST) one by mouth two times a day  #60 x 5   Entered and Authorized by:   D. Thomos Lemons DO   Signed by:   D. Thomos Lemons DO on 01/22/2011   Method used:   Print then Give to Patient   RxID:   [id number]    Orders Added: 1)   Spirometry w/Graph [94010] 2)  Est. Patient Level III [56213]    Current Allergies (reviewed today): No known allergies

## 2011-03-12 ENCOUNTER — Ambulatory Visit: Payer: Self-pay | Admitting: Internal Medicine

## 2011-03-14 ENCOUNTER — Telehealth: Payer: Self-pay | Admitting: Internal Medicine

## 2011-03-14 NOTE — Telephone Encounter (Signed)
Day  She would like to take flunisolide solution in place of fluticasone propionate if that is ok  If not she can get the fluticasone but she has no refills left.   She is out of flonase can she have a sample to use till this comes

## 2011-03-15 MED ORDER — ZAFIRLUKAST 20 MG PO TABS: 20.0000 mg | ORAL_TABLET | Freq: Two times a day (BID) | ORAL | Status: DC

## 2011-03-15 MED ORDER — CITALOPRAM HYDROBROMIDE 20 MG PO TABS: 20.0000 mg | ORAL_TABLET | Freq: Every day | ORAL | Status: DC

## 2011-03-15 NOTE — Telephone Encounter (Signed)
Call placed to patient at [phone], no answer. A detailed voice message was left informing patient per Dr Artist Pais instructions

## 2011-03-15 NOTE — Telephone Encounter (Signed)
Continue to use flonase.  Ok to give pt samples of veramyst See other rx request

## 2011-03-20 ENCOUNTER — Telehealth: Payer: Self-pay | Admitting: *Deleted

## 2011-03-20 MED ORDER — FLUNISOLIDE 29 MCG/ACT NA SOLN: 2.0000 | Freq: Two times a day (BID) | NASAL | Status: DC

## 2011-03-20 MED ORDER — FLUTICASONE FUROATE 27.5 MCG/SPRAY NA SUSP: 2.0000 | Freq: Every day | NASAL | Status: DC

## 2011-03-20 NOTE — Telephone Encounter (Addendum)
Left detailed message on machine re: Veramyst sample (lot [id number]  Exp 02/2013) at front desk for pick up and generic alternative has been sent to [facility] for Nasarel. Advised pt to call if she has any question.

## 2011-03-20 NOTE — Telephone Encounter (Signed)
Patient called and left voice message stating she needs and alternative to Fluticasone, her current insurance does not cover it. She would also like to know if a rx could be sent to [facility] and if samples are available, she would like to stop by the office to pick up.

## 2011-03-20 NOTE — Telephone Encounter (Signed)
See new rx.   Ok for pt to come in to pick up sample of nasonex or veramyst (2 sprays each nostril qd)

## 2011-03-28 ENCOUNTER — Telehealth: Payer: Self-pay | Admitting: *Deleted

## 2011-03-28 NOTE — Telephone Encounter (Signed)
[facility] pharmacist called and left voice message stating the flunisolide has been discontinued by the manufacturer and they would like to know if Dr Artist Pais would like to dispense an alternative. Reference number is [id number].  Spoke with Dr Artist Pais, and he advises to check with patient to see what her insurance covers and advise medication requested is no longer available

## 2011-03-29 MED ORDER — FLUTICASONE PROPIONATE 50 MCG/ACT NA SUSP: 2.0000 | Freq: Every day | NASAL | Status: DC

## 2011-03-29 NOTE — Telephone Encounter (Signed)
Call placed to patient [phone], no answer. A detailed voice message was left for patient informing her flonase generic is no longer manufactured. She was advised to contact her insurance company for an alternative medication and call back with information in order to submit new rx

## 2011-03-29 NOTE — Telephone Encounter (Signed)
Patient returned call and left voice message stating she was contacted by [facility] and they were waiting to hear back from [place] regarding her rx. Call was returned to patient at [phone], no answer. A detailed voice message was left for patient informing her that [facility] informed us the generic flonase is no longer manufactured. She would need to check with them to see what they will cover and call back with the requested information

## 2011-03-29 NOTE — Telephone Encounter (Signed)
Patient call back to state [facility] will cover Fluticasone Propionate. Patient stated that was something that she has been on in the past and would like a rx sent to [facility]

## 2011-04-02 NOTE — Telephone Encounter (Signed)
Call placed to patient at [phone], she was informed of rx change to pharmacy.

## 2011-04-27 LAB — HM MAMMOGRAPHY: HM Mammogram: NEGATIVE

## 2011-05-01 ENCOUNTER — Encounter: Payer: Self-pay | Admitting: Family

## 2011-05-17 ENCOUNTER — Encounter: Payer: Self-pay | Admitting: Internal Medicine

## 2011-08-20 ENCOUNTER — Other Ambulatory Visit: Payer: Self-pay | Admitting: Internal Medicine

## 2011-08-28 ENCOUNTER — Ambulatory Visit: Payer: Self-pay | Admitting: Internal Medicine

## 2011-08-31 ENCOUNTER — Other Ambulatory Visit: Payer: Self-pay | Admitting: *Deleted

## 2011-08-31 NOTE — Telephone Encounter (Signed)
Patient called and left voice message requesting refill for Citalopram and Accolate to [facility] mail order.

## 2011-08-31 NOTE — Telephone Encounter (Signed)
Call placed to patient at [phone], no answer. A detailed voice message was left informing patient office was needed. Message stated patient is unable to obtain a 90 day supply of requested medication. A 30 day to a local pharmacy would be authorized, but office visit is needed.

## 2011-09-03 NOTE — Telephone Encounter (Signed)
No return call from patient regarding medication refill/appointment

## 2011-09-06 ENCOUNTER — Ambulatory Visit (INDEPENDENT_AMBULATORY_CARE_PROVIDER_SITE_OTHER): Payer: 59 | Admitting: Internal Medicine

## 2011-09-06 ENCOUNTER — Encounter: Payer: Self-pay | Admitting: Internal Medicine

## 2011-09-06 DIAGNOSIS — J45909 Unspecified asthma, uncomplicated: Secondary | ICD-10-CM

## 2011-09-06 DIAGNOSIS — F411 Generalized anxiety disorder: Secondary | ICD-10-CM

## 2011-09-06 DIAGNOSIS — E785 Hyperlipidemia, unspecified: Secondary | ICD-10-CM

## 2011-09-06 MED ORDER — CITALOPRAM HYDROBROMIDE 20 MG PO TABS: 20.0000 mg | ORAL_TABLET | Freq: Every day | ORAL | Status: DC

## 2011-09-06 NOTE — Progress Notes (Signed)
  Subjective:    Patient ID: Tricia Potts, female    DOB: 1971/01/01, 40 y.o.   MRN: [id number]  HPI Pt presents to clinic for followup of multiple medical problems. H/o asthma described as intermittent bronchitis. Attempted symbicort now taking accolate. Denies wheezing or dyspnea. States celexa helps with mood swings an IBS sx's. Mammogram ut. No active complaints.   Past Medical History  Diagnosis Date  . Allergy   . IBS (irritable bowel syndrome)   . Hemorrhoid   . Anxiety   . Hyperlipemia   . History of hidradenitis suppurativa    Past Surgical History  Procedure Date  . Hemorrhoid surgery   . Ankle surgery     plate and 8 screws in left ankle    reports that she has never smoked. She has never used smokeless tobacco. She reports that she does not drink alcohol. Her drug history not on file. family history includes Arthritis in an unspecified family member and Ovarian cancer in an unspecified family member. No Known Allergies   Review of Systems see hpi     Objective:   Physical Exam  Physical Exam  Nursing note and vitals reviewed. Constitutional: Appears well-developed and well-nourished. No distress.  HENT:  Head: Normocephalic and atraumatic.  Right Ear: External ear normal.  Left Ear: External ear normal.  Eyes: Conjunctivae are normal. No scleral icterus.  Neck: Neck supple. Carotid bruit is not present.  Cardiovascular: Normal rate, regular rhythm and normal heart sounds.  Exam reveals no gallop and no friction rub.   No murmur heard. Pulmonary/Chest: Effort normal and breath sounds normal. No respiratory distress. He has no wheezes. no rales.  Lymphadenopathy:    He has no cervical adenopathy.  Neurological:Alert.  Skin: Skin is warm and dry. Not diaphoretic.  Psychiatric: Has a normal mood and affect.        Assessment & Plan:

## 2011-09-06 NOTE — Patient Instructions (Signed)
Please schedule cbc, chem7, lft v58.69 and lipid 272.4 prior to next appointment

## 2011-09-07 ENCOUNTER — Other Ambulatory Visit: Payer: Self-pay | Admitting: Internal Medicine

## 2011-09-07 DIAGNOSIS — Z79899 Other long term (current) drug therapy: Secondary | ICD-10-CM

## 2011-09-07 DIAGNOSIS — E785 Hyperlipidemia, unspecified: Secondary | ICD-10-CM

## 2011-09-08 NOTE — Assessment & Plan Note (Signed)
Obtain lipid prior to next visit

## 2011-09-08 NOTE — Assessment & Plan Note (Signed)
rf and continue celexa. Obtain chem7, lft prior to next visit

## 2011-09-08 NOTE — Assessment & Plan Note (Signed)
Stable. Continue accolate.

## 2011-09-18 ENCOUNTER — Other Ambulatory Visit: Payer: Self-pay | Admitting: *Deleted

## 2011-09-18 MED ORDER — CITALOPRAM HYDROBROMIDE 20 MG PO TABS: 20.0000 mg | ORAL_TABLET | Freq: Every day | ORAL | Status: DC

## 2011-09-18 MED ORDER — ZAFIRLUKAST 20 MG PO TABS: 20.0000 mg | ORAL_TABLET | Freq: Two times a day (BID) | ORAL | Status: DC

## 2011-09-18 NOTE — Telephone Encounter (Signed)
Patient called left voice message requesting refill of Celexa and Accolate to [facility].  Rx refills sent to pharmacy.

## 2012-02-25 ENCOUNTER — Other Ambulatory Visit: Payer: Self-pay | Admitting: Internal Medicine

## 2012-02-25 NOTE — Telephone Encounter (Signed)
Rx refill sent to pharmacy. 

## 2012-02-27 NOTE — Progress Notes (Signed)
Addended by: Mervin Kung A on: 02/27/2012 12:25 PM   Modules accepted: Orders

## 2012-02-28 LAB — CBC
HCT: 42 % (ref 36.0–46.0)
Hemoglobin: 13.7 g/dL (ref 12.0–15.0)
MCH: 30.6 pg (ref 26.0–34.0)
MCHC: 32.6 g/dL (ref 30.0–36.0)
MCV: 93.8 fL (ref 78.0–100.0)
Platelets: 298 10*3/uL (ref 150–400)
RBC: 4.48 MIL/uL (ref 3.87–5.11)
RDW: 13.1 % (ref 11.5–15.5)
WBC: 5.1 10*3/uL (ref 4.0–10.5)

## 2012-02-28 LAB — BASIC METABOLIC PANEL
BUN: 9 mg/dL (ref 6–23)
CO2: 25 mEq/L (ref 19–32)
Calcium: 9.5 mg/dL (ref 8.4–10.5)
Chloride: 105 mEq/L (ref 96–112)
Creat: 0.72 mg/dL (ref 0.50–1.10)
Glucose, Bld: 79 mg/dL (ref 70–99)
Potassium: 4.3 mEq/L (ref 3.5–5.3)
Sodium: 140 mEq/L (ref 135–145)

## 2012-03-05 ENCOUNTER — Encounter: Payer: Self-pay | Admitting: Internal Medicine

## 2012-03-05 ENCOUNTER — Telehealth: Payer: Self-pay | Admitting: Internal Medicine

## 2012-03-05 ENCOUNTER — Ambulatory Visit (INDEPENDENT_AMBULATORY_CARE_PROVIDER_SITE_OTHER): Payer: 59 | Admitting: Internal Medicine

## 2012-03-05 VITALS — BP 94/70 | HR 62 | Temp 98.2°F | Resp 16 | Ht 60.0 in | Wt 134.0 lb

## 2012-03-05 DIAGNOSIS — F411 Generalized anxiety disorder: Secondary | ICD-10-CM

## 2012-03-05 DIAGNOSIS — Z0000 Encounter for general adult medical examination without abnormal findings: Secondary | ICD-10-CM

## 2012-03-05 DIAGNOSIS — E785 Hyperlipidemia, unspecified: Secondary | ICD-10-CM

## 2012-03-05 NOTE — Telephone Encounter (Signed)
Lab orders entered for March 2013 & March 2014.

## 2012-03-05 NOTE — Assessment & Plan Note (Signed)
Obtain lipid/lft. 

## 2012-03-05 NOTE — Progress Notes (Signed)
  Subjective:    Patient ID: Tricia Potts, female    DOB: Jun 03, 1971, 41 y.o.   MRN: [id number]  HPI Pt presents to clinic for followup of multiple medical problems. Doing well with anxiety and wishes to attempt celexa weaning. H/o frequent intermittent bronchitis without recent exacerbation. H/o hyperlipidemia not currently requiring medication. No other complaints.  Past Medical History  Diagnosis Date  . Allergy   . IBS (irritable bowel syndrome)   . Hemorrhoid   . Anxiety   . Hyperlipemia   . History of hidradenitis suppurativa    Past Surgical History  Procedure Date  . Hemorrhoid surgery   . Ankle surgery     plate and 8 screws in left ankle    reports that she has never smoked. She has never used smokeless tobacco. She reports that she does not drink alcohol. Her drug history not on file. family history includes Arthritis in an unspecified family member and Ovarian cancer in an unspecified family member. No Known Allergies    Review of Systems see hpi     Objective:   Physical Exam  Physical Exam  Nursing note and vitals reviewed. Constitutional: Appears well-developed and well-nourished. No distress.  HENT:  Head: Normocephalic and atraumatic.  Right Ear: External ear normal.  Left Ear: External ear normal.  Eyes: Conjunctivae are normal. No scleral icterus.  Neck: Neck supple. Carotid bruit is not present.  Cardiovascular: Normal rate, regular rhythm and normal heart sounds.  Exam reveals no gallop and no friction rub.   No murmur heard. Pulmonary/Chest: Effort normal and breath sounds normal. No respiratory distress. He has no wheezes. no rales.  Lymphadenopathy:    He has no cervical adenopathy.  Neurological:Alert.  Skin: Skin is warm and dry. Not diaphoretic.  Psychiatric: Has a normal mood and affect.        Assessment & Plan:

## 2012-03-05 NOTE — Assessment & Plan Note (Signed)
Weaning instructions provided for celexa.

## 2012-03-05 NOTE — Patient Instructions (Signed)
Please schedule fasting lipid/lft 272.4 this week. Also schedule fasting labs prior to next visit Cbc, chem7, lipid, lft, tsh-v70.0

## 2012-05-26 ENCOUNTER — Other Ambulatory Visit: Payer: Self-pay | Admitting: Internal Medicine

## 2012-06-03 ENCOUNTER — Telehealth: Payer: Self-pay | Admitting: Internal Medicine

## 2012-06-03 NOTE — Telephone Encounter (Signed)
Patient called and left voice message requesting a return phone call. Her message states that she spoke with Dr Rodena Medin about weaning off of the Celexa and Accolate; however she has received a refill in the mail. Her message stated that she wanted to make sure that no refills were authorized or refilled without checking with her.   Call placed to [facility] regarding medication refills. I spoke with CSR Tom, he was advised to cancel remaining refills on file for Accolate and Celexa. He stated the medications refills have been removed, and patient could not obtain refund on what was shipped.   Call was returned to patient at [phone], she was informed refill request on medication came through electronically. The refills for mail order are usually generated by the patient. She was informed [facility] could not credit what was already shipped. She stated that she has spoke with [facility] and are aware she is unable to obtain a credit.  She was informed the remaining refills have been cancelled and removed from her medication list. She verified current list is correct. No further action required.

## 2012-06-23 ENCOUNTER — Telehealth: Payer: Self-pay | Admitting: Internal Medicine

## 2012-06-23 NOTE — Telephone Encounter (Signed)
Patient would like a referral to pulmonary(Dr. Delford Field) regarding her asthma.

## 2012-06-24 ENCOUNTER — Ambulatory Visit (INDEPENDENT_AMBULATORY_CARE_PROVIDER_SITE_OTHER): Payer: 59 | Admitting: Internal Medicine

## 2012-06-24 ENCOUNTER — Encounter: Payer: Self-pay | Admitting: Internal Medicine

## 2012-06-24 VITALS — BP 98/68 | HR 62 | Temp 98.4°F | Resp 18 | Wt 134.0 lb

## 2012-06-24 DIAGNOSIS — J45909 Unspecified asthma, uncomplicated: Secondary | ICD-10-CM

## 2012-06-24 MED ORDER — METHYLPREDNISOLONE 4 MG PO KIT: PACK | ORAL | Status: DC

## 2012-06-24 MED ORDER — ALBUTEROL SULFATE (2.5 MG/3ML) 0.083% IN NEBU
2.5000 mg | INHALATION_SOLUTION | Freq: Once | RESPIRATORY_TRACT | Status: AC
  Administered 2012-06-24: 2.5 mg via RESPIRATORY_TRACT

## 2012-06-24 MED ORDER — METHYLPREDNISOLONE ACETATE 80 MG/ML IJ SUSP
20.0000 mg | Freq: Once | INTRAMUSCULAR | Status: AC
  Administered 2012-06-24: 20 mg via INTRAMUSCULAR

## 2012-06-24 NOTE — Progress Notes (Signed)
  Subjective:    Patient ID: Tricia Potts, female    DOB: 07-05-1971, 41 y.o.   MRN: [id number]  HPI Pt presents to clinic for evaluation of possible asthma attack. Until recently previous dx of asthma was related to ?recurrent bronchitis and took accolate. No known PFT's. Notes 2 week h/o intermittent wheezing, dyspnea and np cough. No fever, chills or recent illness. No obvious trigger. No alleviating or exacerbating factors.   Past Medical History  Diagnosis Date  . Allergy   . IBS (irritable bowel syndrome)   . Hemorrhoid   . Anxiety   . Hyperlipemia   . History of hidradenitis suppurativa    Past Surgical History  Procedure Date  . Hemorrhoid surgery   . Ankle surgery     plate and 8 screws in left ankle    reports that she has never smoked. She has never used smokeless tobacco. She reports that she does not drink alcohol. Her drug history not on file. family history includes Arthritis in an unspecified family member and Ovarian cancer in an unspecified family member. No Known Allergies   Review of Systems see hpi     Objective:   Physical Exam  Nursing note and vitals reviewed. Constitutional: She appears well-developed and well-nourished. No distress.  HENT:  Head: Normocephalic and atraumatic.  Right Ear: External ear normal.  Left Ear: External ear normal.  Eyes: Conjunctivae are normal. No scleral icterus.  Neck: Neck supple.  Cardiovascular: Normal rate, regular rhythm and normal heart sounds.   Pulmonary/Chest: Effort normal. No respiratory distress. She has wheezes. She has no rales.       Bilateral upper lobe wheezing. S/p albuterol neb wheezing noted to have almost entirely resolved.   Neurological: She is alert.  Skin: Skin is warm and dry. She is not diaphoretic.  Psychiatric: She has a normal mood and affect.          Assessment & Plan:

## 2012-06-24 NOTE — Assessment & Plan Note (Signed)
Albuterol neb administered with subjective and exam improvement. Given depomedrol IM injection. Begin medrol dosepak. Given sample of proventil mdi 2 puffs q4-6 hours prn. Defers cxr. Schedule close follow up. Instructed to present to nearest ED if dyspnea worsens. States understanding and agreement. Has pulmonary consult pending.

## 2012-06-27 ENCOUNTER — Encounter: Payer: Self-pay | Admitting: Family

## 2012-06-27 ENCOUNTER — Ambulatory Visit (INDEPENDENT_AMBULATORY_CARE_PROVIDER_SITE_OTHER): Payer: 59 | Admitting: Family

## 2012-06-27 VITALS — BP 92/60 | HR 54 | Temp 98.2°F | Resp 16 | Ht 60.0 in | Wt 135.0 lb

## 2012-06-27 DIAGNOSIS — J45909 Unspecified asthma, uncomplicated: Secondary | ICD-10-CM

## 2012-06-27 NOTE — Patient Instructions (Addendum)
Follow up as needed

## 2012-06-27 NOTE — Progress Notes (Signed)
Subjective:    Patient ID: Tricia Potts, female    DOB: Mar 12, 1971, 41 y.o.   MRN: [id number]  HPI  [name] is a 41 yr old female who presents today for a 3 day follow up of her acute asthma exacerbation.  She was started at that time on a medrol pak and an albuterol inhaler.  She started medrol on Wednesday am. Notes that symptoms are a tiny bit better.  Laughing seems to get coughing going.  She reports some associated wheezing.  She    Review of Systems See HPI  Past Medical History  Diagnosis Date  . Allergy   . IBS (irritable bowel syndrome)   . Hemorrhoid   . Anxiety   . Hyperlipemia   . History of hidradenitis suppurativa     History   Social History  . Marital Status: Married    Spouse Name: N/A    Number of Children: N/A  . Years of Education: N/A   Occupational History  . Not on file.   Social History Main Topics  . Smoking status: Never Smoker   . Smokeless tobacco: Never Used  . Alcohol Use: No  . Drug Use: Not on file  . Sexually Active: Not on file   Other Topics Concern  . Not on file   Social History Narrative  . No narrative on file    Past Surgical History  Procedure Date  . Hemorrhoid surgery   . Ankle surgery     plate and 8 screws in left ankle    Family History  Problem Relation Age of Onset  . Ovarian cancer      Grandmother  . Arthritis      No Known Allergies  Current Outpatient Prescriptions on File Prior to Visit  Medication Sig Dispense Refill  . albuterol (PROVENTIL HFA;VENTOLIN HFA) 108 (90 BASE) MCG/ACT inhaler Inhale 2 puffs into the lungs every 4 (four) hours as needed.      Alphonsus Sias Pollen 500 MG CHEW Chew 18 mg by mouth daily.        . Cetirizine HCl (WAL-ZYR PO) Take by mouth daily.      . fexofenadine (ALLEGRA) 180 MG tablet Take 180 mg by mouth daily.      . methylPREDNISolone (MEDROL, PAK,) 4 MG tablet follow package directions  21 tablet  0  . Multiple Vitamin (MULTIVITAMINS PO) Take by mouth daily.         . zafirlukast (ACCOLATE) 20 MG tablet Take 20 mg by mouth 2 (two) times daily.      Marland Kitchen DISCONTD: fluticasone (FLONASE) 50 MCG/ACT nasal spray 2 sprays by Nasal route daily.  180 Act  3    BP 92/60  Pulse 54  Temp 98.2 F (36.8 C) (Oral)  Resp 16  Ht 5' (1.524 m)  Wt 135 lb (61.236 kg)  BMI 26.37 kg/m2  SpO2 99%  LMP 06/03/2012       Objective:   Physical Exam  Constitutional: She appears well-developed and well-nourished. No distress.  HENT:  Head: Normocephalic and atraumatic.  Right Ear: Tympanic membrane and ear canal normal.  Left Ear: Tympanic membrane and ear canal normal.  Mouth/Throat: No oropharyngeal exudate, posterior oropharyngeal edema or posterior oropharyngeal erythema.  Cardiovascular: Normal rate and regular rhythm.   No murmur heard. Pulmonary/Chest: Effort normal and breath sounds normal. No respiratory distress. She has no wheezes. She has no rales. She exhibits no tenderness.  Musculoskeletal: She exhibits no edema.  Psychiatric: She  has a normal mood and affect. Her behavior is normal. Judgment and thought content normal.          Assessment & Plan:

## 2012-06-27 NOTE — Assessment & Plan Note (Signed)
Exam is improving.  Clinical improvement lagging behind a bit, but I expect symptoms to continue to improve.  Recommended that pt continue albuterol every 6 hours for next few days then prn.  Complete medrol dose pak.  Call if symptoms worsen, or if symptoms do not continue to improve.

## 2012-06-30 ENCOUNTER — Ambulatory Visit (HOSPITAL_BASED_OUTPATIENT_CLINIC_OR_DEPARTMENT_OTHER)
Admission: RE | Admit: 2012-06-30 | Discharge: 2012-06-30 | Disposition: A | Discharge status: Home / Self Care | Payer: 59 | Source: Ambulatory Visit | Attending: Family | Admitting: Family

## 2012-06-30 ENCOUNTER — Ambulatory Visit (INDEPENDENT_AMBULATORY_CARE_PROVIDER_SITE_OTHER): Payer: 59 | Admitting: Family

## 2012-06-30 ENCOUNTER — Telehealth: Payer: Self-pay | Admitting: Internal Medicine

## 2012-06-30 ENCOUNTER — Encounter: Payer: Self-pay | Admitting: Family

## 2012-06-30 VITALS — BP 106/80 | HR 69 | Temp 97.8°F | Resp 24

## 2012-06-30 DIAGNOSIS — R062 Wheezing: Secondary | ICD-10-CM

## 2012-06-30 DIAGNOSIS — J45909 Unspecified asthma, uncomplicated: Secondary | ICD-10-CM

## 2012-06-30 DIAGNOSIS — R05 Cough: Secondary | ICD-10-CM | POA: Insufficient documentation

## 2012-06-30 DIAGNOSIS — R059 Cough, unspecified: Secondary | ICD-10-CM | POA: Insufficient documentation

## 2012-06-30 MED ORDER — ALBUTEROL SULFATE (2.5 MG/3ML) 0.083% IN NEBU
2.5000 mg | INHALATION_SOLUTION | Freq: Once | RESPIRATORY_TRACT | Status: AC
  Administered 2012-06-30: 2.5 mg via RESPIRATORY_TRACT

## 2012-06-30 MED ORDER — ALBUTEROL SULFATE (2.5 MG/3ML) 0.083% IN NEBU: 2.5000 mg | INHALATION_SOLUTION | Freq: Four times a day (QID) | RESPIRATORY_TRACT | Status: DC | PRN

## 2012-06-30 MED ORDER — PREDNISONE 10 MG PO TABS: ORAL_TABLET | ORAL | Status: DC

## 2012-06-30 MED ORDER — DEXLANSOPRAZOLE 60 MG PO CPDR: 60.0000 mg | DELAYED_RELEASE_CAPSULE | Freq: Every day | ORAL | Status: DC

## 2012-06-30 MED ORDER — AMOXICILLIN-POT CLAVULANATE 875-125 MG PO TABS: 1.0000 | ORAL_TABLET | Freq: Two times a day (BID) | ORAL | Status: AC

## 2012-06-30 NOTE — Telephone Encounter (Signed)
Pt seen and evaluated in the office this morning.

## 2012-06-30 NOTE — Patient Instructions (Addendum)
Please follow up with Dr. Rodena Medin in 3 days.  Go to the ER if you develop severe shortness of breath.

## 2012-06-30 NOTE — Telephone Encounter (Signed)
Patient states that she was seen last Friday by [facility] regarding problems with asthma. She says that her asthma is not any better and would like to know what she should do?  Patient does had an upcoming appointment with Dr. Sherene Sires on 07/09/12 for her asthma.

## 2012-06-30 NOTE — Assessment & Plan Note (Addendum)
Deteriorated.  A cxr is performed today and is negative for acute changes. Note is made of some mild bilateral pleural thickening and some  minimal interstitial thickening or scarring at the left lung base.Will place her back on steroids, plan prednisone 40mg  once daily for the next few days with close follow up.  She responded well to the albuterol neb here in the office.  I have given her rx for a nebulizer with tubing and sent prescription to her pharm for albuterol nebs.  In addition, will start empiric dexilant to cover for silent reflux which may be contributing to her symptoms.  Will also rx with augmentin empirically to cover for possible underlying sinusitis or bronchitis.  Recommended ibuprofen for her musculoskeletal pain with coughing.

## 2012-06-30 NOTE — Progress Notes (Signed)
Subjective:    Patient ID: Tricia Potts, female    DOB: December 27, 1970, 41 y.o.   MRN: [id number]  HPI  [name] is a 41 yr old female who presents today for follow up of her asthma.  She presents with chief complaint of tightness with breathing.  She was seen on 06/27/12 for follow up and her symptoms seemed to be slowly improving on the medrol dose-pak.  She reports that on Saturday, though, her symptoms worsened, and she had  "an asthma attack."  Feels like it hurts to cough under the left rib.  Tells me that she has been having her family members "hug me when I cough." This is due to the pain.  Reports ongoing cough and chest congestion.  Feels "like I can't catch my breath."  She denies associated sinus pain/pressure/drainage.  She does report occasional heartburn.     Review of Systems See HPI  Past Medical History  Diagnosis Date  . Allergy   . IBS (irritable bowel syndrome)   . Hemorrhoid   . Anxiety   . Hyperlipemia   . History of hidradenitis suppurativa     History   Social History  . Marital Status: Married    Spouse Name: N/A    Number of Children: N/A  . Years of Education: N/A   Occupational History  . Not on file.   Social History Main Topics  . Smoking status: Never Smoker   . Smokeless tobacco: Never Used  . Alcohol Use: No  . Drug Use: Not on file  . Sexually Active: Not on file   Other Topics Concern  . Not on file   Social History Narrative  . No narrative on file    Past Surgical History  Procedure Date  . Hemorrhoid surgery   . Ankle surgery     plate and 8 screws in left ankle    Family History  Problem Relation Age of Onset  . Ovarian cancer      Grandmother  . Arthritis      No Known Allergies  Current Outpatient Prescriptions on File Prior to Visit  Medication Sig Dispense Refill  . albuterol (PROVENTIL HFA;VENTOLIN HFA) 108 (90 BASE) MCG/ACT inhaler Inhale 2 puffs into the lungs every 4 (four) hours as needed.      Tricia Potts 500 MG CHEW Chew 18 mg by mouth daily.        . Cetirizine HCl (WAL-ZYR PO) Take by mouth daily.      . citalopram (CELEXA) 20 MG tablet Take 20 mg by mouth daily.      . fexofenadine (ALLEGRA) 180 MG tablet Take 180 mg by mouth daily.      . Multiple Vitamin (MULTIVITAMINS PO) Take by mouth daily.        . zafirlukast (ACCOLATE) 20 MG tablet Take 20 mg by mouth 2 (two) times daily.      . methylPREDNISolone (MEDROL, PAK,) 4 MG tablet follow package directions  21 tablet  0  . DISCONTD: fluticasone (FLONASE) 50 MCG/ACT nasal spray 2 sprays by Nasal route daily.  180 Act  3    BP 106/80  Pulse 69  Temp 97.8 F (36.6 C) (Oral)  Resp 24  SpO2 99%  LMP 06/03/2012       Objective:   Physical Exam  Constitutional: She is oriented to person, place, and time. She appears well-developed and well-nourished. No distress.  HENT:  Head: Normocephalic and atraumatic.  Right Ear: Tympanic  membrane and ear canal normal.  Left Ear: Tympanic membrane and ear canal normal.  Mouth/Throat: No posterior oropharyngeal edema or posterior oropharyngeal erythema.  Cardiovascular: Normal rate and regular rhythm.   No murmur heard. Pulmonary/Chest: Effort normal and breath sounds normal.       Loud expiratory wheeze.  L>R, mild conversational dyspnea is noted.  Wheezing and dyspnea improved after pt completed the nebulizer treatment.   Neurological: She is alert and oriented to person, place, and time.  Psychiatric: She has a normal mood and affect. Her behavior is normal. Judgment and thought content normal.          Assessment & Plan:

## 2012-06-30 NOTE — Addendum Note (Signed)
Addended by: Mervin Kung A on: 06/30/2012 03:10 PM   Modules accepted: Orders

## 2012-07-03 ENCOUNTER — Encounter: Payer: Self-pay | Admitting: Internal Medicine

## 2012-07-03 ENCOUNTER — Ambulatory Visit (INDEPENDENT_AMBULATORY_CARE_PROVIDER_SITE_OTHER): Payer: 59 | Admitting: Internal Medicine

## 2012-07-03 VITALS — BP 112/72 | HR 84 | Temp 98.4°F | Resp 20 | Ht 60.0 in | Wt 136.0 lb

## 2012-07-03 DIAGNOSIS — J45909 Unspecified asthma, uncomplicated: Secondary | ICD-10-CM

## 2012-07-03 MED ORDER — FLUTICASONE-SALMETEROL 100-50 MCG/DOSE IN AEPB: 1.0000 | INHALATION_SPRAY | Freq: Two times a day (BID) | RESPIRATORY_TRACT | Status: DC

## 2012-07-03 NOTE — Assessment & Plan Note (Signed)
Improving. Continue empiric PPI (further samples of dexilant given). Begin advair bid with mouth rinse-sample provided. Continue albuterol prn and keep pulmonary appt.

## 2012-07-03 NOTE — Progress Notes (Signed)
  Subjective:    Patient ID: Tricia Potts, female    DOB: [birth year], 41 y.o.   MRN: [id number]  HPI Pt presents to clinic for follow up of suspected asthma exacerbation. Notes mild improvement with steroids. Taking PPI samples and believe may be helpful as well. She suspects she has atypical features of GERD. Requiring albuterol nebs bid. CXR without infiltrate. Has pulmonary appointment in near future.   Past Medical History  Diagnosis Date  . Allergy   . IBS (irritable bowel syndrome)   . Hemorrhoid   . Anxiety   . Hyperlipemia   . History of hidradenitis suppurativa    Past Surgical History  Procedure Date  . Hemorrhoid surgery   . Ankle surgery     plate and 8 screws in left ankle    reports that she has never smoked. She has never used smokeless tobacco. She reports that she does not drink alcohol. Her drug history not on file. family history includes Arthritis in an unspecified family member and Ovarian cancer in an unspecified family member. No Known Allergies   Review of Systems see hpi     Objective:   Physical Exam  Nursing note and vitals reviewed. Constitutional: She appears well-developed and well-nourished. No distress.  HENT:  Head: Normocephalic and atraumatic.  Right Ear: External ear normal.  Left Ear: External ear normal.  Eyes: Conjunctivae are normal. No scleral icterus.  Neck: Neck supple.  Cardiovascular: Normal rate, regular rhythm and normal heart sounds.   Pulmonary/Chest: Effort normal and breath sounds normal. No respiratory distress. She has no wheezes. She has no rales.  Neurological: She is alert.  Skin: Skin is warm and dry. She is not diaphoretic.  Psychiatric: She has a normal mood and affect.          Assessment & Plan:

## 2012-07-09 ENCOUNTER — Encounter: Payer: Self-pay | Admitting: Internal Medicine

## 2012-07-09 ENCOUNTER — Ambulatory Visit (INDEPENDENT_AMBULATORY_CARE_PROVIDER_SITE_OTHER): Payer: 59 | Admitting: Internal Medicine

## 2012-07-09 VITALS — BP 116/86 | HR 64 | Temp 98.3°F | Ht 60.75 in | Wt 137.2 lb

## 2012-07-09 DIAGNOSIS — J45909 Unspecified asthma, uncomplicated: Secondary | ICD-10-CM

## 2012-07-09 MED ORDER — BUDESONIDE-FORMOTEROL FUMARATE 160-4.5 MCG/ACT IN AERO: INHALATION_SPRAY | RESPIRATORY_TRACT | Status: DC

## 2012-07-09 NOTE — Progress Notes (Signed)
  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  ? Dx of asthma rx with accolate x one year prior to OV  Worse breathing, persistent cough so rx   advair, prednisone, nebs and hfa and albuterol > only slightly better with main c/o "congestion",  Cough worse as day goes on > never produces mucus, some gagging.    Sleeping ok without nocturnal  or early am exacerbation  of respiratory  c/o's or need for noct saba. Also denies any obvious fluctuation of symptoms with weather or environmental changes or other aggravating or alleviating factors except as outlined above     Review of Systems  Constitutional: Negative for fever, chills and unexpected weight change.  HENT: Negative for ear pain, nosebleeds, congestion, sore throat, rhinorrhea, sneezing, trouble swallowing, dental problem, voice change, postnasal drip and sinus pressure.   Eyes: Negative for visual disturbance.  Respiratory: Positive for cough and shortness of breath. Negative for choking.   Cardiovascular: Negative for chest pain and leg swelling.  Gastrointestinal: Negative for vomiting, abdominal pain and diarrhea.  Genitourinary: Negative for difficulty urinating.  Musculoskeletal: Negative for arthralgias.  Skin: Negative for rash.  Neurological: Negative for tremors, syncope and headaches.  Hematological: Does not bruise/bleed easily.       Objective:   Physical Exam Wt Readings from Last 3 Encounters:  07/09/12 137 lb 3.2 oz (62.234 kg)    07/03/12 136 lb (61.689 kg)  06/27/12 135 lb (61.236 kg)    HEENT: nl dentition, turbinates, and orophanx. Nl external ear canals without cough reflex   NECK :  without JVD/Nodes/TM/ nl carotid upstrokes bilaterally   LUNGS: no acc muscle use, clear to A and P bilaterally without cough on insp or exp maneuvers   CV:  RRR  no s3 or murmur or increase in P2, no edema   ABD:  soft and nontender with nl excursion in the supine position. No bruits or organomegaly, bowel sounds nl  MS:  warm without deformities, calf tenderness, cyanosis or clubbing  SKIN: warm and dry without lesions    NEURO:  alert, approp, no deficits    06/30/12 cxr No acute cardiopulmonary disease.      Assessment & Plan:

## 2012-07-09 NOTE — Patient Instructions (Addendum)
Stop advair  Start symbicort 160 Take 2 puffs first thing in am and then another 2 puffs about 12 hours later. (plan A)    Only use your albuterol as a rescue medication(Plan B is ventolin, Plan C is the nebulizer) to be used if you can't catch your breath by resting or doing a relaxed purse lip breathing pattern. The less you use it, the better it will work when you need it.   Dexilant 60 mg Take 30-60 min before first meal of the day and Pepcid 20 mg one at bedtime until return  GERD (REFLUX)  is an extremely common cause of respiratory symptoms, many times with no significant heartburn at all.    It can be treated with medication, but also with lifestyle changes including avoidance of late meals, excessive alcohol, smoking cessation, and avoid fatty foods, chocolate, peppermint, colas, red wine, and acidic juices such as orange juice.  NO MINT OR MENTHOL PRODUCTS SO NO COUGH DROPS  USE SUGARLESS CANDY INSTEAD (jolley ranchers or Stover's)  NO OIL BASED VITAMINS - use powdered substitutes.    Take mucinex dm 2  every 12 hours and supplement if needed with  tramadol 50 mg up to 1- 2 every 4 hours to suppress the urge to cough. Swallowing water or using ice chips/non mint and menthol containing candies (such as lifesavers or sugarless jolly ranchers) are also effective.  You should rest your voice and avoid activities that you know make you cough.  Once you have eliminated the cough for 3 straight days try reducing the tramadol first,  then the mucinex dm  Please schedule a follow up office visit in 4 weeks, sooner if needed

## 2012-07-11 ENCOUNTER — Telehealth: Payer: Self-pay | Admitting: Internal Medicine

## 2012-07-11 MED ORDER — TRAMADOL HCL 50 MG PO TABS: ORAL_TABLET | ORAL | Status: DC

## 2012-07-11 NOTE — Telephone Encounter (Signed)
Rx was sent to pharm for tramadol  

## 2012-07-13 NOTE — Assessment & Plan Note (Signed)
Symptoms are markedly disproportionate to objective findings and not clear this is a lung problem but pt does appear to have difficult airway management issues. DDX of  difficult airways managment all start with A and  include Adherence, Ace Inhibitors, Acid Reflux, Active Sinus Disease, Alpha 1 Antitripsin deficiency, Anxiety masquerading as Airways dz,  ABPA,  allergy(esp in young), Aspiration (esp in elderly), Adverse effects of DPI,  Active smokers, plus two Bs  = Bronchiectasis and Beta blocker use..and one C= CHF  Adherence is always the initial "prime suspect" and is a multilayered concern that requires a "trust but verify" approach in every patient - starting with knowing how to use medications, especially inhalers, correctly, keeping up with refills and understanding the fundamental difference between maintenance and prns vs those medications only taken for a very short course and then stopped and not refilled.  The proper method of use, as well as anticipated side effects, of a metered-dose inhaler are discussed and demonstrated to the patient. Improved effectiveness after extensive coaching during this visit to a level of approximately  75%  ? Adverse response to advair (she improved but this did not eliminate her symptoms as it should have ) > try symbicort samples  ? Acid reflux > max gerd rx and diet until see response   ? Anxiety (note RN) > dx of exclusion.

## 2012-07-14 ENCOUNTER — Other Ambulatory Visit: Payer: Self-pay | Admitting: *Deleted

## 2012-07-14 MED ORDER — FLUTICASONE PROPIONATE 50 MCG/ACT NA SUSP: 2.0000 | Freq: Every day | NASAL | Status: DC

## 2012-07-14 NOTE — Telephone Encounter (Signed)
Rx Done/SLS 

## 2012-08-06 ENCOUNTER — Ambulatory Visit: Payer: 59 | Admitting: Internal Medicine

## 2012-08-11 ENCOUNTER — Encounter: Payer: Self-pay | Admitting: Internal Medicine

## 2012-08-11 ENCOUNTER — Ambulatory Visit (INDEPENDENT_AMBULATORY_CARE_PROVIDER_SITE_OTHER): Payer: 59 | Admitting: Internal Medicine

## 2012-08-11 VITALS — BP 112/80 | HR 74 | Temp 98.3°F | Ht 61.0 in | Wt 136.4 lb

## 2012-08-11 DIAGNOSIS — J45909 Unspecified asthma, uncomplicated: Secondary | ICD-10-CM

## 2012-08-11 MED ORDER — PANTOPRAZOLE SODIUM 40 MG PO TBEC: 40.0000 mg | DELAYED_RELEASE_TABLET | Freq: Every day | ORAL | Status: DC

## 2012-08-11 MED ORDER — FAMOTIDINE 20 MG PO TABS: ORAL_TABLET | ORAL | Status: DC

## 2012-08-11 MED ORDER — BUDESONIDE-FORMOTEROL FUMARATE 160-4.5 MCG/ACT IN AERO: INHALATION_SPRAY | RESPIRATORY_TRACT | Status: DC

## 2012-08-11 NOTE — Patient Instructions (Signed)
Generic protonix 40 mg should do just as well Take 30-60 min before first meal of the day and continue the Pepcid 20 mg one at bedtime  Work on inhaler technique:  relax and gently blow all the way out then take a nice smooth deep breath back in, triggering the inhaler at same time you start breathing in.  Hold for up to 5 seconds if you can.  Rinse and gargle with water when done   If your mouth or throat starts to bother you,   I suggest you time the inhaler to your dental care and after using the inhaler(s) brush teeth and tongue with a baking soda containing toothpaste and when you rinse this out, gargle with it first to see if this helps your mouth and throat.     Please schedule a follow up visit in 3 months but call sooner if needed

## 2012-08-11 NOTE — Assessment & Plan Note (Signed)
-   Spirometry 01/22/11: wnl   - HFA 08/11/2012  75%   The proper method of use, as well as anticipated side effects, of a metered-dose inhaler are discussed and demonstrated to the patient. Improved effectiveness after extensive coaching during this visit to a level of approximately  75% from baseline of  <25%  Marked improvement in symptoms despite poor hfa so rec continue max gerd rx and maintain on bid symbicort 160 x 3 months then consider stepdown therapy.  See instructions for specific recommendations which were reviewed directly with the patient who was given a copy with highlighter outlining the key components.

## 2012-08-11 NOTE — Progress Notes (Signed)
  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  ? Dx of asthma rx with accolate x one year prior to OV  Worse breathing, persistent cough so rx   advair, prednisone, nebs and hfa and albuterol > only slightly better with main c/o "congestion",  Cough worse as day goes on > never produces mucus, some gagging.  rec Stop advair Start symbicort 160 Take 2 puffs first thing in am and then another 2 puffs about 12 hours later. (plan A) Only use your albuterol as a rescue medication(Plan B is ventolin, Plan C is the nebulizer)  Dexilant 60 mg Take 30-60 min before first meal of the day and Pepcid 20 mg one at bedtime until return GERD diet Take mucinex dm 2  every 12 hours and supplement if needed with  tramadol 50 mg up to 1- 2 every 4 hours   08/11/2012 f/u ov/Tricia Potts ccsob better> ok with housework and walking the dog and min am cough> never coughs it excess mucus. No need for any saba at all.  Sleeping ok without nocturnal  or early am exacerbation  of respiratory  c/o's or need for noct saba. Also denies any obvious fluctuation of symptoms with weather or environmental changes or other aggravating or alleviating factors except as outlined above   ROS  The following are not active complaints unless bolded sore throat, dysphagia, dental problems, itching, sneezing,  nasal congestion or excess/ purulent secretions, ear ache,   fever, chills, sweats, unintended wt loss, pleuritic or exertional cp, hemoptysis,  orthopnea pnd or  leg swelling, presyncope, palpitations, heartburn, abdominal pain, anorexia, nausea, vomiting, diarrhea  or change in bowel or urinary habits, change in stools or urine, dysuria,hematuria,  rash, arthralgias, visual complaints, headache, numbness weakness or ataxia or problems with walking or coordination,  change in mood/affect or memory.           Objective:   Physical Exam Wt 08/11/2012   > 136 Wt Readings from Last 3 Encounters:  07/09/12 137 lb 3.2 oz (62.234 kg)  07/03/12 136 lb (61.689 kg)  06/27/12 135 lb (61.236 kg)    HEENT: nl dentition, turbinates, and orophanx. Nl external ear canals without cough reflex   NECK :  without JVD/Nodes/TM/ nl carotid upstrokes bilaterally   LUNGS: no acc muscle use, clear to A and P bilaterally without cough on insp or exp maneuvers   CV:  RRR  no s3 or murmur or increase in P2, no edema   ABD:  soft and nontender with nl excursion in the supine position. No bruits or organomegaly, bowel sounds nl  MS:  warm without deformities, calf tenderness, cyanosis or clubbing  SKIN: warm and dry without lesions    NEURO:  alert, approp, no deficits    06/30/12 cxr No acute cardiopulmonary disease.      Assessment & Plan:

## 2012-09-10 ENCOUNTER — Telehealth: Payer: Self-pay | Admitting: Internal Medicine

## 2012-09-10 MED ORDER — CITALOPRAM HYDROBROMIDE 20 MG PO TABS: 20.0000 mg | ORAL_TABLET | Freq: Every day | ORAL | Status: DC

## 2012-09-10 NOTE — Telephone Encounter (Signed)
Patient left message on voicemail requesting a refill of citalopram to be sent to [facility]

## 2012-09-10 NOTE — Telephone Encounter (Signed)
Sent!

## 2012-11-12 ENCOUNTER — Encounter: Payer: Self-pay | Admitting: Internal Medicine

## 2012-11-12 ENCOUNTER — Ambulatory Visit (INDEPENDENT_AMBULATORY_CARE_PROVIDER_SITE_OTHER): Payer: 59 | Admitting: Internal Medicine

## 2012-11-12 VITALS — BP 102/70 | HR 65 | Temp 98.3°F | Ht 61.0 in | Wt 138.0 lb

## 2012-11-12 DIAGNOSIS — J45909 Unspecified asthma, uncomplicated: Secondary | ICD-10-CM

## 2012-11-12 NOTE — Progress Notes (Signed)
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  ? Dx of asthma rx with accolate x one year prior to OV  Worse breathing, persistent cough so rx   advair, prednisone, nebs and hfa and albuterol > only slightly better with main c/o "congestion",  Cough worse as day goes on > never produces mucus, some gagging.  rec Stop advair Start symbicort 160 Take 2 puffs first thing in am and then another 2 puffs about 12 hours later. (plan A) Only use your albuterol as a rescue medication(Plan B is ventolin, Plan C is the nebulizer)  Dexilant 60 mg Take 30-60 min before first meal of the day and Pepcid 20 mg one at bedtime until return GERD diet Take mucinex dm 2  every 12 hours and supplement if needed with  tramadol 50 mg up to 1- 2 every 4 hours   08/11/2012 f/u ov/Tricia Potts ccsob better> ok with housework and walking the dog and min am cough> never coughs it excess mucus. No need for any saba at all. rec Generic protonix 40 mg should do just as well Take 30-60 min before first meal of the day and continue the Pepcid 20 mg one at bedtime Work on inhaler technique  11/12/2012 f/u ov/Tricia Potts cc completely satisfied with sob and cough, not even having any sniffles while maintained on acid suppression/ accolate and tapering off symbicort.  Sleeping ok without nocturnal  or early am exacerbation  of respiratory  c/o's or need for noct saba. Also denies any obvious fluctuation of symptoms with weather or environmental changes or other  aggravating or alleviating factors except as outlined above   ROS  The following are not active complaints unless bolded sore throat, dysphagia, dental problems, itching, sneezing,  nasal congestion or excess/ purulent secretions, ear ache,   fever, chills, sweats, unintended wt loss, pleuritic or exertional cp, hemoptysis,  orthopnea pnd or leg swelling, presyncope, palpitations, heartburn, abdominal pain, anorexia, nausea, vomiting, diarrhea  or change in bowel or urinary habits, change in stools or urine, dysuria,hematuria,  rash, arthralgias, visual complaints, headache, numbness weakness or ataxia or problems with walking or coordination,  change in mood/affect or memory.           Objective:   Physical Exam Wt 08/11/2012   > 138 11/12/2012  Wt Readings from Last 3 Encounters:  07/09/12 137 lb 3.2 oz (62.234 kg)  07/03/12 136 lb (61.689 kg)  06/27/12 135 lb (61.236 kg)    HEENT: nl dentition, turbinates, and orophanx. Nl external ear canals without cough reflex   NECK :  without JVD/Nodes/TM/ nl carotid upstrokes bilaterally   LUNGS: no acc muscle use, clear to A and P bilaterally without cough on insp or exp maneuvers   CV:  RRR  no s3 or murmur or increase in P2, no edema   ABD:  soft and nontender with nl excursion in the supine position. No bruits or organomegaly, bowel sounds nl  MS:  warm without deformities, calf tenderness, cyanosis or clubbing  SKIN: warm and dry without lesions       06/30/12 cxr No acute cardiopulmonary disease.      Assessment & Plan:

## 2012-11-12 NOTE — Assessment & Plan Note (Signed)
-   Spirometry 01/22/11: wnl   - HFA 08/11/2012  75% > 11/12/2012  90%  - Trial off accolate rec 11/12/2012   All goals of chronic asthma control met including optimal function and elimination of symptoms with minimal need for rescue therapy.  Contingencies discussed in full including contacting this office immediately if not controlling the symptoms using the rule of two's.   It could be that with rx of gerd chronically doesn't need any maint inhalers but when she does needs to optimize hfa  The proper method of use, as well as anticipated side effects, of a metered-dose inhaler are discussed and demonstrated to the patient. Improved effectiveness after extensive coaching during this visit to a level of approximately  90%  Pulmonary f/u can be prn

## 2012-11-12 NOTE — Patient Instructions (Signed)
symbicort 160 Take 2 puffs first thing in am and then another 2 puffs about 12 hours later if have any wheeze, cough, short of breath.  Ok to stop accolate but remember the effect or lack thereof takes about a week    If you are satisfied with your treatment plan let your doctor know and he/she can either refill your medications or you can return here when your prescription runs out.     If in any way you are not 100% satisfied,  please tell us.  If 100% better, tell your friends!

## 2013-03-06 ENCOUNTER — Encounter: Payer: 59 | Admitting: Internal Medicine

## 2013-03-17 ENCOUNTER — Encounter: Payer: 59 | Admitting: Family Medicine

## 2013-04-17 ENCOUNTER — Telehealth: Payer: Self-pay | Admitting: *Deleted

## 2013-04-17 DIAGNOSIS — Z0000 Encounter for general adult medical examination without abnormal findings: Secondary | ICD-10-CM

## 2013-04-17 LAB — CBC
HCT: 36.7 % (ref 36.0–46.0)
Hemoglobin: 12.1 g/dL (ref 12.0–15.0)
MCHC: 33 g/dL (ref 30.0–36.0)

## 2013-04-17 NOTE — Telephone Encounter (Signed)
Pt presented to the lab, future orders released. 

## 2013-04-18 LAB — BASIC METABOLIC PANEL
CO2: 25 mEq/L (ref 19–32)
Calcium: 9.6 mg/dL (ref 8.4–10.5)
Creat: 0.66 mg/dL (ref 0.50–1.10)
Glucose, Bld: 75 mg/dL (ref 70–99)
Sodium: 139 mEq/L (ref 135–145)

## 2013-04-18 LAB — LIPID PANEL: Total CHOL/HDL Ratio: 3.6 Ratio

## 2013-04-18 LAB — HEPATIC FUNCTION PANEL
AST: 17 U/L (ref 0–37)
Bilirubin, Direct: 0.1 mg/dL (ref 0.0–0.3)
Total Bilirubin: 0.6 mg/dL (ref 0.3–1.2)

## 2013-04-18 LAB — TSH: TSH: 1.87 u[IU]/mL (ref 0.350–4.500)

## 2013-04-21 ENCOUNTER — Ambulatory Visit (INDEPENDENT_AMBULATORY_CARE_PROVIDER_SITE_OTHER): Payer: 59 | Admitting: Family Medicine

## 2013-04-21 ENCOUNTER — Encounter: Payer: Self-pay | Admitting: Family Medicine

## 2013-04-21 VITALS — BP 110/74 | HR 62 | Temp 98.4°F | Ht 60.5 in | Wt 130.0 lb

## 2013-04-21 DIAGNOSIS — K589 Irritable bowel syndrome without diarrhea: Secondary | ICD-10-CM

## 2013-04-21 DIAGNOSIS — L732 Hidradenitis suppurativa: Secondary | ICD-10-CM

## 2013-04-21 DIAGNOSIS — Z0000 Encounter for general adult medical examination without abnormal findings: Secondary | ICD-10-CM

## 2013-04-21 DIAGNOSIS — E785 Hyperlipidemia, unspecified: Secondary | ICD-10-CM

## 2013-04-21 DIAGNOSIS — L709 Acne, unspecified: Secondary | ICD-10-CM | POA: Insufficient documentation

## 2013-04-21 DIAGNOSIS — J45909 Unspecified asthma, uncomplicated: Secondary | ICD-10-CM

## 2013-04-21 DIAGNOSIS — J411 Mucopurulent chronic bronchitis: Secondary | ICD-10-CM

## 2013-04-21 DIAGNOSIS — L708 Other acne: Secondary | ICD-10-CM

## 2013-04-21 HISTORY — DX: Mucopurulent chronic bronchitis: J41.1

## 2013-04-21 NOTE — Assessment & Plan Note (Signed)
Increased with recent blood, draw, avoid trans fats, add a krill oil caps

## 2013-04-21 NOTE — Patient Instructions (Addendum)
Krill oil caps, MegaRed daily Probiotics such as Digestive Advantage daily all by [employer]  Preventive Care for Adults, Female A healthy lifestyle and preventive care can promote health and wellness. Preventive health guidelines for women include the following key practices.  A routine yearly physical is a good way to check with your caregiver about your health and preventive screening. It is a chance to share any concerns and updates on your health, and to receive a thorough exam.  Visit your dentist for a routine exam and preventive care every 6 months. Brush your teeth twice a day and floss once a day. Good oral hygiene prevents tooth decay and gum disease.  The frequency of eye exams is based on your age, health, family medical history, use of contact lenses, and other factors. Follow your caregiver's recommendations for frequency of eye exams.  Eat a healthy diet. Foods like vegetables, fruits, whole grains, low-fat dairy products, and lean protein foods contain the nutrients you need without too many calories. Decrease your intake of foods high in solid fats, added sugars, and salt. Eat the right amount of calories for you.Get information about a proper diet from your caregiver, if necessary.  Regular physical exercise is one of the most important things you can do for your health. Most adults should get at least 150 minutes of moderate-intensity exercise (any activity that increases your heart rate and causes you to sweat) each week. In addition, most adults need muscle-strengthening exercises on 2 or more days a week.  Maintain a healthy weight. The body mass index (BMI) is a screening tool to identify possible weight problems. It provides an estimate of body fat based on height and weight. Your caregiver can help determine your BMI, and can help you achieve or maintain a healthy weight.For adults 20 years and older:  A BMI below 18.5 is considered underweight.  A BMI of 18.5 to 24.9 is  normal.  A BMI of 25 to 29.9 is considered overweight.  A BMI of 30 and above is considered obese.  Maintain normal blood lipids and cholesterol levels by exercising and minimizing your intake of saturated fat. Eat a balanced diet with plenty of fruit and vegetables. Blood tests for lipids and cholesterol should begin at age 7 and be repeated every 5 years. If your lipid or cholesterol levels are high, you are over 50, or you are at high risk for heart disease, you may need your cholesterol levels checked more frequently.Ongoing high lipid and cholesterol levels should be treated with medicines if diet and exercise are not effective.  If you smoke, find out from your caregiver how to quit. If you do not use tobacco, do not start.  If you are pregnant, do not drink alcohol. If you are breastfeeding, be very cautious about drinking alcohol. If you are not pregnant and choose to drink alcohol, do not exceed 1 drink per day. One drink is considered to be 12 ounces (355 mL) of beer, 5 ounces (148 mL) of wine, or 1.5 ounces (44 mL) of liquor.  Avoid use of street drugs. Do not share needles with anyone. Ask for help if you need support or instructions about stopping the use of drugs.  High blood pressure causes heart disease and increases the risk of stroke. Your blood pressure should be checked at least every 1 to 2 years. Ongoing high blood pressure should be treated with medicines if weight loss and exercise are not effective.  If you are 55 to 44  years old, ask your caregiver if you should take aspirin to prevent strokes.  Diabetes screening involves taking a blood sample to check your fasting blood sugar level. This should be done once every 3 years, after age 34, if you are within normal weight and without risk factors for diabetes. Testing should be considered at a younger age or be carried out more frequently if you are overweight and have at least 1 risk factor for diabetes.  Breast cancer  screening is essential preventive care for women. You should practice "breast self-awareness." This means understanding the normal appearance and feel of your breasts and may include breast self-examination. Any changes detected, no matter how small, should be reported to a caregiver. Women in their 12s and 30s should have a clinical breast exam (CBE) by a caregiver as part of a regular health exam every 1 to 3 years. After age 59, women should have a CBE every year. Starting at age 21, women should consider having a mammography (breast X-ray test) every year. Women who have a family history of breast cancer should talk to their caregiver about genetic screening. Women at a high risk of breast cancer should talk to their caregivers about having magnetic resonance imaging (MRI) and a mammography every year.  The Pap test is a screening test for cervical cancer. A Pap test can show cell changes on the cervix that might become cervical cancer if left untreated. A Pap test is a procedure in which cells are obtained and examined from the lower end of the uterus (cervix).  Women should have a Pap test starting at age 60.  Between ages 75 and 30, Pap tests should be repeated every 2 years.  Beginning at age 39, you should have a Pap test every 3 years as long as the past 3 Pap tests have been normal.  Some women have medical problems that increase the chance of getting cervical cancer. Talk to your caregiver about these problems. It is especially important to talk to your caregiver if a new problem develops soon after your last Pap test. In these cases, your caregiver may recommend more frequent screening and Pap tests.  The above recommendations are the same for women who have or have not gotten the vaccine for human papillomavirus (HPV).  If you had a hysterectomy for a problem that was not cancer or a condition that could lead to cancer, then you no longer need Pap tests. Even if you no longer need a Pap  test, a regular exam is a good idea to make sure no other problems are starting.  If you are between ages 23 and 78, and you have had normal Pap tests going back 10 years, you no longer need Pap tests. Even if you no longer need a Pap test, a regular exam is a good idea to make sure no other problems are starting.  If you have had past treatment for cervical cancer or a condition that could lead to cancer, you need Pap tests and screening for cancer for at least 20 years after your treatment.  If Pap tests have been discontinued, risk factors (such as a new sexual partner) need to be reassessed to determine if screening should be resumed.  The HPV test is an additional test that may be used for cervical cancer screening. The HPV test looks for the virus that can cause the cell changes on the cervix. The cells collected during the Pap test can be tested for HPV. The  HPV test could be used to screen women aged 41 years and older, and should be used in women of any age who have unclear Pap test results. After the age of 47, women should have HPV testing at the same frequency as a Pap test.  Colorectal cancer can be detected and often prevented. Most routine colorectal cancer screening begins at the age of 23 and continues through age 80. However, your caregiver may recommend screening at an earlier age if you have risk factors for colon cancer. On a yearly basis, your caregiver may provide home test kits to check for hidden blood in the stool. Use of a small camera at the end of a tube, to directly examine the colon (sigmoidoscopy or colonoscopy), can detect the earliest forms of colorectal cancer. Talk to your caregiver about this at age 30, when routine screening begins. Direct examination of the colon should be repeated every 5 to 10 years through age 53, unless early forms of pre-cancerous polyps or small growths are found.  Hepatitis C blood testing is recommended for all people born from [birth year] through  1965 and any individual with known risks for hepatitis C.  Practice safe sex. Use condoms and avoid high-risk sexual practices to reduce the spread of sexually transmitted infections (STIs). STIs include gonorrhea, chlamydia, syphilis, trichomonas, herpes, HPV, and human immunodeficiency virus (HIV). Herpes, HIV, and HPV are viral illnesses that have no cure. They can result in disability, cancer, and death. Sexually active women aged 59 and younger should be checked for chlamydia. Older women with new or multiple partners should also be tested for chlamydia. Testing for other STIs is recommended if you are sexually active and at increased risk.  Osteoporosis is a disease in which the bones lose minerals and strength with aging. This can result in serious bone fractures. The risk of osteoporosis can be identified using a bone density scan. Women ages 68 and over and women at risk for fractures or osteoporosis should discuss screening with their caregivers. Ask your caregiver whether you should take a calcium supplement or vitamin D to reduce the rate of osteoporosis.  Menopause can be associated with physical symptoms and risks. Hormone replacement therapy is available to decrease symptoms and risks. You should talk to your caregiver about whether hormone replacement therapy is right for you.  Use sunscreen with sun protection factor (SPF) of 30 or more. Apply sunscreen liberally and repeatedly throughout the day. You should seek shade when your shadow is shorter than you. Protect yourself by wearing long sleeves, pants, a wide-brimmed hat, and sunglasses year round, whenever you are outdoors.  Once a month, do a whole body skin exam, using a mirror to look at the skin on your back. Notify your caregiver of new moles, moles that have irregular borders, moles that are larger than a pencil eraser, or moles that have changed in shape or color.  Stay current with required immunizations.  Influenza. You  need a dose every fall (or winter). The composition of the flu vaccine changes each year, so being vaccinated once is not enough.  Pneumococcal polysaccharide. You need 1 to 2 doses if you smoke cigarettes or if you have certain chronic medical conditions. You need 1 dose at age 22 (or older) if you have never been vaccinated.  Tetanus, diphtheria, pertussis (Tdap, Td). Get 1 dose of Tdap vaccine if you are younger than age 24, are over 43 and have contact with an infant, are a Dietitian, are pregnant,  or simply want to be protected from whooping cough. After that, you need a Td booster dose every 10 years. Consult your caregiver if you have not had at least 3 tetanus and diphtheria-containing shots sometime in your life or have a deep or dirty wound.  HPV. You need this vaccine if you are a woman age 104 or younger. The vaccine is given in 3 doses over 6 months.  Measles, mumps, rubella (MMR). You need at least 1 dose of MMR if you were born in 1957 or later. You may also need a second dose.  Meningococcal. If you are age 85 to 50 and a first-year college student living in a residence hall, or have one of several medical conditions, you need to get vaccinated against meningococcal disease. You may also need additional booster doses.  Zoster (shingles). If you are age 16 or older, you should get this vaccine.  Varicella (chickenpox). If you have never had chickenpox or you were vaccinated but received only 1 dose, talk to your caregiver to find out if you need this vaccine.  Hepatitis A. You need this vaccine if you have a specific risk factor for hepatitis A virus infection or you simply wish to be protected from this disease. The vaccine is usually given as 2 doses, 6 to 18 months apart.  Hepatitis B. You need this vaccine if you have a specific risk factor for hepatitis B virus infection or you simply wish to be protected from this disease. The vaccine is given in 3 doses, usually over 6  months. Preventive Services / Frequency Ages 59 to 9  Blood pressure check.** / Every 1 to 2 years.  Lipid and cholesterol check.** / Every 5 years beginning at age 85.  Clinical breast exam.** / Every 3 years for women in their 62s and 72s.  Pap test.** / Every 2 years from ages 66 through 21. Every 3 years starting at age 1 through age 29 or 47 with a history of 3 consecutive normal Pap tests.  HPV screening.** / Every 3 years from ages 86 through ages 56 to 40 with a history of 3 consecutive normal Pap tests.  Hepatitis C blood test.** / For any individual with known risks for hepatitis C.  Skin self-exam. / Monthly.  Influenza immunization.** / Every year.  Pneumococcal polysaccharide immunization.** / 1 to 2 doses if you smoke cigarettes or if you have certain chronic medical conditions.  Tetanus, diphtheria, pertussis (Tdap, Td) immunization. / A one-time dose of Tdap vaccine. After that, you need a Td booster dose every 10 years.  HPV immunization. / 3 doses over 6 months, if you are 59 and younger.  Measles, mumps, rubella (MMR) immunization. / You need at least 1 dose of MMR if you were born in 1957 or later. You may also need a second dose.  Meningococcal immunization. / 1 dose if you are age 40 to 62 and a first-year college student living in a residence hall, or have one of several medical conditions, you need to get vaccinated against meningococcal disease. You may also need additional booster doses.  Varicella immunization.** / Consult your caregiver.  Hepatitis A immunization.** / Consult your caregiver. 2 doses, 6 to 18 months apart.  Hepatitis B immunization.** / Consult your caregiver. 3 doses usually over 6 months. Ages 51 to 57  Blood pressure check.** / Every 1 to 2 years.  Lipid and cholesterol check.** / Every 5 years beginning at age 55.  Clinical breast exam.** /  Every year after age 41.  Mammogram.** / Every year beginning at age 66 and  continuing for as long as you are in good health. Consult with your caregiver.  Pap test.** / Every 3 years starting at age 22 through age 57 or 24 with a history of 3 consecutive normal Pap tests.  HPV screening.** / Every 3 years from ages 32 through ages 52 to 31 with a history of 3 consecutive normal Pap tests.  Fecal occult blood test (FOBT) of stool. / Every year beginning at age 9 and continuing until age 12. You may not need to do this test if you get a colonoscopy every 10 years.  Flexible sigmoidoscopy or colonoscopy.** / Every 5 years for a flexible sigmoidoscopy or every 10 years for a colonoscopy beginning at age 1 and continuing until age 86.  Hepatitis C blood test.** / For all people born from [birth year] through 1965 and any individual with known risks for hepatitis C.  Skin self-exam. / Monthly.  Influenza immunization.** / Every year.  Pneumococcal polysaccharide immunization.** / 1 to 2 doses if you smoke cigarettes or if you have certain chronic medical conditions.  Tetanus, diphtheria, pertussis (Tdap, Td) immunization.** / A one-time dose of Tdap vaccine. After that, you need a Td booster dose every 10 years.  Measles, mumps, rubella (MMR) immunization. / You need at least 1 dose of MMR if you were born in 1957 or later. You may also need a second dose.  Varicella immunization.** / Consult your caregiver.  Meningococcal immunization.** / Consult your caregiver.  Hepatitis A immunization.** / Consult your caregiver. 2 doses, 6 to 18 months apart.  Hepatitis B immunization.** / Consult your caregiver. 3 doses, usually over 6 months. Ages 63 and over  Blood pressure check.** / Every 1 to 2 years.  Lipid and cholesterol check.** / Every 5 years beginning at age 29.  Clinical breast exam.** / Every year after age 1.  Mammogram.** / Every year beginning at age 64 and continuing for as long as you are in good health. Consult with your caregiver.  Pap test.** / Every  3 years starting at age 1 through age 38 or 25 with a 3 consecutive normal Pap tests. Testing can be stopped between 65 and 70 with 3 consecutive normal Pap tests and no abnormal Pap or HPV tests in the past 10 years.  HPV screening.** / Every 3 years from ages 34 through ages 20 or 61 with a history of 3 consecutive normal Pap tests. Testing can be stopped between 65 and 70 with 3 consecutive normal Pap tests and no abnormal Pap or HPV tests in the past 10 years.  Fecal occult blood test (FOBT) of stool. / Every year beginning at age 37 and continuing until age 28. You may not need to do this test if you get a colonoscopy every 10 years.  Flexible sigmoidoscopy or colonoscopy.** / Every 5 years for a flexible sigmoidoscopy or every 10 years for a colonoscopy beginning at age 1 and continuing until age 79.  Hepatitis C blood test.** / For all people born from [birth year] through 1967 and any individual with known risks for hepatitis C.  Osteoporosis screening.** / A one-time screening for women ages 89 and over and women at risk for fractures or osteoporosis.  Skin self-exam. / Monthly.  Influenza immunization.** / Every year.  Pneumococcal polysaccharide immunization.** / 1 dose at age 77 (or older) if you have never been vaccinated.  Tetanus, diphtheria, pertussis (  Tdap, Td) immunization. / A one-time dose of Tdap vaccine if you are over 65 and have contact with an infant, are a Research scientist (physical sciences), or simply want to be protected from whooping cough. After that, you need a Td booster dose every 10 years.  Varicella immunization.** / Consult your caregiver.  Meningococcal immunization.** / Consult your caregiver.  Hepatitis A immunization.** / Consult your caregiver. 2 doses, 6 to 18 months apart.  Hepatitis B immunization.** / Check with your caregiver. 3 doses, usually over 6 months. ** Family history and personal history of risk and conditions may change your caregiver's  recommendations. Document Released: 02/05/2002 Document Revised: 03/03/2012 Document Reviewed: 05/07/2011 [facility] Patient Information 2013 [place], [place].

## 2013-04-21 NOTE — Assessment & Plan Note (Addendum)
Symptoms flared with recent URI resolved now that illness resolved. Used some acid suppression andantihistamines. Off of inhaled steroids at this time and doing well

## 2013-04-23 DIAGNOSIS — Z0000 Encounter for general adult medical examination without abnormal findings: Secondary | ICD-10-CM | POA: Insufficient documentation

## 2013-04-23 NOTE — Assessment & Plan Note (Signed)
Reviewed labs with patient, encouraged heart healthy diet and regular exercise.

## 2013-04-23 NOTE — Assessment & Plan Note (Signed)
Encouraged probiotics, fiber supplements and increased hydration

## 2013-04-23 NOTE — Progress Notes (Signed)
Patient ID: Tricia Potts, female   DOB: 1971-04-06, 42 y.o.   MRN: [id number] Tricia Potts [id number] Oct 21, 1971 04/23/2013      Progress Note-Follow Up  Subjective  Chief Complaint  Chief Complaint  Patient presents with  . Annual Exam    physical    HPI  Patient is a 42 year old Caucasian female who is in today for annual exam. She was recently started with a bad upper respiratory infection and resultant bronchitic/asthmatic symptoms. Since being treated with antihistamines and anti-acid medications in treating her URI she feels completely well. She denies any coughing or wheezing. Congestion is largely resolved. No chest pain, palpitations, shortness of breath, GI or GU concerns are noted today.  Past Medical History  Diagnosis Date  . Allergy   . IBS (irritable bowel syndrome)   . Hemorrhoid   . Anxiety   . Hyperlipemia   . History of hidradenitis suppurativa   . Bronchitis, mucopurulent recurrent 04/21/2013    Past Surgical History  Procedure Laterality Date  . Hemorrhoid surgery    . Ankle surgery      plate and 8 screws in left ankle    Family History  Problem Relation Age of Onset  . Ovarian cancer      Grandmother  . Arthritis    . Allergies Mother   . COPD Mother   . Heart disease Father     mitral valve disease/rupture  . GER disease Father   . Irritable bowel syndrome Father   . Cancer Brother 17    ALL  . Hepatitis C Brother   . Heart disease Daughter     asd s/p repair at age 52  . Anxiety disorder Daughter   . Cancer Maternal Grandmother     ovarian cancer  . Heart disease Maternal Grandfather     MI at 47  . Kidney disease Paternal Grandfather     possible kidney cancer  . Allergies Sister   . Eczema Sister   . Cancer Sister 81    breast, DCIS  . Allergies Daughter     History   Social History  . Marital Status: Married    Spouse Name: N/A    Number of Children: N/A  . Years of Education: N/A   Occupational History  . Not  on file.   Social History Main Topics  . Smoking status: Never Smoker   . Smokeless tobacco: Never Used  . Alcohol Use: No     Comment: vegetarian  . Drug Use: Not on file  . Sexually Active: Yes   Other Topics Concern  . Not on file   Social History Narrative  . No narrative on file    No current outpatient prescriptions on file prior to visit.   No current facility-administered medications on file prior to visit.    No Known Allergies  Review of Systems  Review of Systems  Constitutional: Negative for fever and malaise/fatigue.  HENT: Positive for congestion.   Eyes: Negative for discharge.  Respiratory: Negative for shortness of breath.   Cardiovascular: Negative for chest pain, palpitations and leg swelling.  Gastrointestinal: Negative for nausea, abdominal pain and diarrhea.  Genitourinary: Negative for dysuria, urgency and frequency.  Musculoskeletal: Negative for falls.  Skin: Negative for rash.  Neurological: Negative for loss of consciousness and headaches.  Endo/Heme/Allergies: Negative for polydipsia.  Psychiatric/Behavioral: Negative for depression and suicidal ideas. The patient is not nervous/anxious and does not have insomnia.     Objective  BP  110/74  Pulse 62  Temp(Src) 98.4 F (36.9 C) (Oral)  Ht 5' 0.5" (1.537 m)  Wt 130 lb 0.6 oz (58.986 kg)  BMI 24.97 kg/m2  SpO2 97%  LMP 04/12/2013  Physical Exam  Physical Exam  Constitutional: She is oriented to person, place, and time and well-developed, well-nourished, and in no distress. No distress.  HENT:  Head: Normocephalic and atraumatic.  Right Ear: External ear normal.  Left Ear: External ear normal.  Nose: Nose normal.  Mouth/Throat: Oropharynx is clear and moist. No oropharyngeal exudate.  Eyes: Conjunctivae are normal. Pupils are equal, round, and reactive to light. Right eye exhibits no discharge. Left eye exhibits no discharge. No scleral icterus.  Neck: Normal range of motion. Neck  supple. No thyromegaly present.  Cardiovascular: Normal rate, regular rhythm, normal heart sounds and intact distal pulses.   No murmur heard. Pulmonary/Chest: Effort normal and breath sounds normal. No respiratory distress. She has no wheezes. She has no rales.  Abdominal: Soft. Bowel sounds are normal. She exhibits no distension and no mass. There is no tenderness.  Musculoskeletal: Normal range of motion. She exhibits no edema and no tenderness.  Lymphadenopathy:    She has no cervical adenopathy.  Neurological: She is alert and oriented to person, place, and time. She has normal reflexes. No cranial nerve deficit. Coordination normal.  Skin: Skin is warm and dry. No rash noted. She is not diaphoretic.  Psychiatric: Mood, memory and affect normal.    Lab Results  Component Value Date   TSH 1.870 04/17/2013   Lab Results  Component Value Date   WBC 4.1 04/17/2013   HGB 12.1 04/17/2013   HCT 36.7 04/17/2013   MCV 84.2 04/17/2013   PLT 375 04/17/2013   Lab Results  Component Value Date   CREATININE 0.66 04/17/2013   BUN 14 04/17/2013   NA 139 04/17/2013   K 4.3 04/17/2013   CL 104 04/17/2013   CO2 25 04/17/2013   Lab Results  Component Value Date   ALT 11 04/17/2013   AST 17 04/17/2013   ALKPHOS 57 04/17/2013   BILITOT 0.6 04/17/2013   Lab Results  Component Value Date   CHOL 232* 04/17/2013   Lab Results  Component Value Date   HDL 65 04/17/2013   Lab Results  Component Value Date   LDLCALC 148* 04/17/2013   Lab Results  Component Value Date   TRIG 95 04/17/2013   Lab Results  Component Value Date   CHOLHDL 3.6 04/17/2013     Assessment & Plan  ASTHMA Symptoms flared with recent URI resolved now that illness resolved. Used some acid suppression andantihistamines. Off of inhaled steroids at this time and doing well  HYPERLIPIDEMIA Increased with recent blood, draw, avoid trans fats, add a krill oil caps  IRRITABLE BOWEL SYNDROME Encouraged probiotics, fiber  supplements and increased hydration  Preventative health care Reviewed labs with patient, encouraged heart healthy diet and regular exercise.

## 2013-04-27 ENCOUNTER — Encounter: Payer: Self-pay | Admitting: Obstetrics and Gynecology

## 2013-06-19 ENCOUNTER — Ambulatory Visit: Payer: Self-pay | Admitting: Obstetrics and Gynecology

## 2013-07-16 ENCOUNTER — Encounter: Payer: Self-pay | Admitting: Obstetrics and Gynecology

## 2013-07-17 ENCOUNTER — Ambulatory Visit (INDEPENDENT_AMBULATORY_CARE_PROVIDER_SITE_OTHER): Payer: 59 | Admitting: Obstetrics and Gynecology

## 2013-07-17 ENCOUNTER — Encounter: Payer: Self-pay | Admitting: Obstetrics and Gynecology

## 2013-07-17 VITALS — BP 100/60 | HR 64 | Resp 18 | Ht 60.75 in | Wt 130.0 lb

## 2013-07-17 DIAGNOSIS — Z01419 Encounter for gynecological examination (general) (routine) without abnormal findings: Secondary | ICD-10-CM

## 2013-07-17 NOTE — Progress Notes (Addendum)
42 y.o.   Married    Caucasian   female   G2P2003   here for annual exam.    Patient's last menstrual period was 07/06/2013.          Sexually active: yes  The current method of family planning is vasectomy.    Exercising: walking 5-6 days a week Last mammogram:  07/13/13 normal Last pap smear: 06/01/10 neg History of abnormal pap: no Smoking: no Alcohol: no Last colonoscopy:never Last Bone Density: 03/2010 osteopenia   Last tetanus shot: less than 10 years Last cholesterol check: 03/2013 elevated (diet, exercise)  Hgb:  pcp              Urine: pcp   Family History  Problem Relation Age of Onset  . Ovarian cancer      Grandmother  . Arthritis    . Allergies Mother   . COPD Mother   . Heart disease Father     mitral valve disease/rupture  . GER disease Father   . Irritable bowel syndrome Father   . Heart failure Father   . Hepatitis C Brother   . Cancer Brother 17    ALL  . Heart disease Daughter     asd s/p repair at age 48  . Anxiety disorder Daughter   . Cancer Maternal Grandmother     ovarian cancer  . Heart disease Maternal Grandfather     MI at 74  . Kidney disease Paternal Grandfather     possible kidney cancer  . Allergies Sister   . Eczema Sister   . Cancer Sister 36    breast, DCIS  . Allergies Daughter     Patient Active Problem List   Diagnosis Date Noted  . Preventative health care 04/23/2013  . Bronchitis, mucopurulent recurrent 04/21/2013  . Acne 04/21/2013  . ASTHMA 01/22/2011  . HYPERLIPIDEMIA 02/17/2009  . ANXIETY 02/17/2009  . HEMORRHOIDS 02/17/2009  . ALLERGIC RHINITIS 02/17/2009  . IRRITABLE BOWEL SYNDROME 02/17/2009    Past Medical History  Diagnosis Date  . Allergy   . IBS (irritable bowel syndrome)   . Hemorrhoid   . Anxiety   . Hyperlipemia   . History of hidradenitis suppurativa   . Bronchitis, mucopurulent recurrent 04/21/2013  . PCO (polycystic ovaries)   . Asthma   . Broken ankle 2011    (left) roller skating     Past Surgical History  Procedure Laterality Date  . Hemorrhoid surgery    . Ankle surgery      plate and 8 screws in left ankle    Allergies: Review of patient's allergies indicates no known allergies.  Current Outpatient Prescriptions  Medication Sig Dispense Refill  . cetirizine (ZYRTEC) 10 MG tablet Take 10 mg by mouth as needed.       . Cyanocobalamin (VITAMIN B-12) 5000 MCG SUBL Place under the tongue.      . fluticasone (FLONASE) 50 MCG/ACT nasal spray Place 2 sprays into the nose daily.      . Multiple Vitamin (MULTI-VITAMIN DAILY PO) Take by mouth daily.      . Omega-3 Krill Oil 500 MG CAPS Take by mouth daily.      Marland Kitchen OVER THE COUNTER MEDICATION OTC eye drops      . Probiotic Product (PRO-BIOTIC BLEND PO) Take by mouth daily.       No current facility-administered medications for this visit.    ROS: Pertinent items are noted in HPI.  Social ZO:XWRUEAV, three children with one  set of twin girls and a younger daughter too (twins 91, other daughter 79)    Exam:   Ht stable and wt down 4 pounds from last year BP 100/60  Pulse 64  Resp 18  Ht 5' 0.75" (1.543 m)  Wt 130 lb (58.968 kg)  BMI 24.77 kg/m2  LMP 07/06/2013   Wt Readings from Last 3 Encounters:  07/17/13 130 lb (58.968 kg)  04/21/13 130 lb 0.6 oz (58.986 kg)  11/12/12 138 lb (62.596 kg)     Ht Readings from Last 3 Encounters:  07/17/13 5' 0.75" (1.543 m)  04/21/13 5' 0.5" (1.537 m)  11/12/12 5\' 1"  (1.549 m)    General appearance: alert, cooperative and appears stated age Head: Normocephalic, without obvious abnormality, atraumatic Neck: no adenopathy, supple, symmetrical, trachea midline and thyroid not enlarged, symmetric, no tenderness/mass/nodules Lungs: clear to auscultation bilaterally Breasts: Inspection negative, No nipple retraction or dimpling, No nipple discharge or bleeding, No axillary or supraclavicular adenopathy, Normal to palpation without dominant masses Heart: regular rate and  rhythm Abdomen: soft, non-tender; bowel sounds normal; no masses,  no organomegaly Extremities: extremities normal, atraumatic, no cyanosis or edema Skin: Skin color, texture, turgor normal. No rashes or lesions Lymph nodes: Cervical, supraclavicular, and axillary nodes normal. No abnormal inguinal nodes palpated Neurologic: Grossly normal   Pelvic: External genitalia:  no lesions              Urethra:  normal appearing urethra with no masses, tenderness or lesions              Bartholins and Skenes: normal                 Vagina: normal appearing vagina with normal color and discharge, no lesions              Cervix: normal appearance              Pap taken: yes        Bimanual Exam:  Uterus:  uterus is normal size, shape, consistency and nontender, RF, mobile                                      Adnexa: normal adnexa in size, nontender and no masses                                      Rectovaginal: Confirms                                      Anus:  normal sphincter tone, no lesions  A: normal gyn exam, vasectomy     Anxiety, on tx     Asthma, IBS     P:     mammogram pap smear counseled on breast self exam, mammography screening, adequate intake of calcium and vitamin D, diet and exercise return annually or prn     An After Visit Summary was printed and given to the patient.  07/24/2013 Pap with nl cytology and + HPV.  Rec:  Repeat cotesting in 1 year.  CPRomine MD

## 2013-07-17 NOTE — Patient Instructions (Signed)

## 2013-08-25 ENCOUNTER — Other Ambulatory Visit: Payer: Self-pay

## 2013-08-25 MED ORDER — FLUTICASONE PROPIONATE 50 MCG/ACT NA SUSP: 2.0000 | Freq: Every day | NASAL | Status: DC

## 2013-08-25 NOTE — Telephone Encounter (Signed)
Patient left a message stating that she needs a refill on her Flonase sent to [facility]

## 2013-10-05 ENCOUNTER — Telehealth: Payer: Self-pay

## 2013-10-05 NOTE — Telephone Encounter (Signed)
I am willing to take over writing this but will typically need a follow up visit 8 to 12 weeks after restarting to assess how they are doing. Paroxetine 10 mg po 1 tab po qd disp #30 with 2 rf

## 2013-10-05 NOTE — Telephone Encounter (Signed)
Patient left a message stating that she used to take generic paxil on and off and would like a refill of this medication?  Please advise?

## 2013-10-06 MED ORDER — PAROXETINE HCL 10 MG PO TABS: 10.0000 mg | ORAL_TABLET | ORAL | Status: DC

## 2013-10-06 NOTE — Telephone Encounter (Signed)
Left a message for patient to return my call. RX sent

## 2013-10-12 NOTE — Telephone Encounter (Signed)
Called and spoke with pt and pt verbalized understanding.  Pt will make a follow up appt.  Pt has picked up rx.

## 2013-11-24 ENCOUNTER — Encounter: Payer: Self-pay | Admitting: Family Medicine

## 2013-11-24 ENCOUNTER — Ambulatory Visit (INDEPENDENT_AMBULATORY_CARE_PROVIDER_SITE_OTHER): Payer: 59 | Admitting: Family Medicine

## 2013-11-24 ENCOUNTER — Telehealth: Payer: Self-pay | Admitting: Family Medicine

## 2013-11-24 VITALS — BP 118/72 | HR 67 | Temp 98.4°F | Ht 60.75 in | Wt 130.1 lb

## 2013-11-24 DIAGNOSIS — F411 Generalized anxiety disorder: Secondary | ICD-10-CM

## 2013-11-24 DIAGNOSIS — E785 Hyperlipidemia, unspecified: Secondary | ICD-10-CM

## 2013-11-24 DIAGNOSIS — K589 Irritable bowel syndrome without diarrhea: Secondary | ICD-10-CM

## 2013-11-24 DIAGNOSIS — Z0000 Encounter for general adult medical examination without abnormal findings: Secondary | ICD-10-CM

## 2013-11-24 MED ORDER — PAROXETINE HCL 10 MG PO TABS: 10.0000 mg | ORAL_TABLET | ORAL | Status: DC

## 2013-11-24 NOTE — Telephone Encounter (Signed)
Lab order week of 06-20-2014 Annual labs prior lipid, renal, cbc, tsh, hepatic

## 2013-11-24 NOTE — Telephone Encounter (Signed)
Lab order placed.

## 2013-11-24 NOTE — Progress Notes (Signed)
Pre visit review using our clinic review tool, if applicable. No additional management support is needed unless otherwise documented below in the visit note. 

## 2013-11-24 NOTE — Patient Instructions (Signed)
Irritable Bowel Syndrome °Irritable Bowel Syndrome (IBS) is caused by a disturbance of normal bowel function. Other terms used are spastic colon, mucous colitis, and irritable colon. It does not require surgery, nor does it lead to cancer. There is no cure for IBS. But with proper diet, stress reduction, and medication, you will find that your problems (symptoms) will gradually disappear or improve. IBS is a common digestive disorder. It usually appears in late adolescence or early adulthood. Women develop it twice as often as men. °CAUSES  °After food has been digested and absorbed in the small intestine, waste material is moved into the colon (large intestine). In the colon, water and salts are absorbed from the undigested products coming from the small intestine. The remaining residue, or fecal material, is held for elimination. Under normal circumstances, gentle, rhythmic contractions on the bowel walls push the fecal material along the colon towards the rectum. In IBS, however, these contractions are irregular and poorly coordinated. The fecal material is either retained too long, resulting in constipation, or expelled too soon, producing diarrhea. °SYMPTOMS  °The most common symptom of IBS is pain. It is typically in the lower left side of the belly (abdomen). But it may occur anywhere in the abdomen. It can be felt as heartburn, backache, or even as a dull pain in the arms or shoulders. The pain comes from excessive bowel-muscle spasms and from the buildup of gas and fecal material in the colon. This pain: °· Can range from sharp belly (abdominal) cramps to a dull, continuous ache. °· Usually worsens soon after eating. °· Is typically relieved by having a bowel movement or passing gas. °Abdominal pain is usually accompanied by constipation. But it may also produce diarrhea. The diarrhea typically occurs right after a meal or upon arising in the morning. The stools are typically soft and watery. They are often  flecked with secretions (mucus). °Other symptoms of IBS include: °· Bloating. °· Loss of appetite. °· Heartburn. °· Feeling sick to your stomach (nausea). °· Belching °· Vomiting °· Gas. °IBS may also cause a number of symptoms that are unrelated to the digestive system: °· Fatigue. °· Headaches. °· Anxiety °· Shortness of breath °· Difficulty in concentrating. °· Dizziness. °These symptoms tend to come and go. °DIAGNOSIS  °The symptoms of IBS closely mimic the symptoms of other, more serious digestive disorders. So your caregiver may wish to perform a variety of additional tests to exclude these disorders. He/she wants to be certain of learning what is wrong (diagnosis). The nature and purpose of each test will be explained to you. °TREATMENT °A number of medications are available to help correct bowel function and/or relieve bowel spasms and abdominal pain. Among the drugs available are: °· Mild, non-irritating laxatives for severe constipation and to help restore normal bowel habits. °· Specific anti-diarrheal medications to treat severe or prolonged diarrhea. °· Anti-spasmodic agents to relieve intestinal cramps. °· Your caregiver may also decide to treat you with a mild tranquilizer or sedative during unusually stressful periods in your life. °The important thing to remember is that if any drug is prescribed for you, make sure that you take it exactly as directed. Make sure that your caregiver knows how well it worked for you. °HOME CARE INSTRUCTIONS  °· Avoid foods that are high in fat or oils. Some examples are:heavy cream, butter, frankfurters, sausage, and other fatty meats. °· Avoid foods that have a laxative effect, such as fruit, fruit juice, and dairy products. °· Cut out   carbonated drinks, chewing gum, and "gassy" foods, such as beans and cabbage. This may help relieve bloating and belching. °· Bran taken with plenty of liquids may help relieve constipation. °· Keep track of what foods seem to trigger  your symptoms. °· Avoid emotionally charged situations or circumstances that produce anxiety. °· Start or continue exercising. °· Get plenty of rest and sleep. °MAKE SURE YOU:  °· Understand these instructions. °· Will watch your condition. °· Will get help right away if you are not doing well or get worse. °Document Released: 12/10/2005 Document Revised: 03/03/2012 Document Reviewed: 07/30/2008 °ExitCare® Patient Information ©2014 [employer]. ° °

## 2013-11-25 NOTE — Assessment & Plan Note (Signed)
Stress persists but Paroxetine is helping

## 2013-11-25 NOTE — Progress Notes (Signed)
Patient ID: Tricia Potts, female   DOB: August 06, 1971, 42 y.o.   MRN: [id number] Tricia Potts [id number] 1971-02-20 11/25/2013      Progress Note-Follow Up  Subjective  Chief Complaint  Chief Complaint  Patient presents with  . Follow-up    on medications    HPI  Patient is a 42 year old Caucasian female who is in today for followup. She is feeling much better. She was started back on paroxetine in her last visit and while stress continues her anxiety and her irritable bowel symptoms have improved. She denies any other new concerns. Allergies are responding fairly well to treatments. No fevers or chills. No flaring asthma. No chest pain, palpitations or shortness of breath.  Past Medical History  Diagnosis Date  . Allergy   . IBS (irritable bowel syndrome)   . Hemorrhoid   . Anxiety   . Hyperlipemia   . History of hidradenitis suppurativa   . Bronchitis, mucopurulent recurrent 04/21/2013  . PCO (polycystic ovaries)   . Asthma   . Broken ankle 2011    (left) roller skating    Past Surgical History  Procedure Laterality Date  . Hemorrhoid surgery    . Ankle surgery      plate and 8 screws in left ankle    Family History  Problem Relation Age of Onset  . Ovarian cancer      Grandmother  . Arthritis    . Allergies Mother   . COPD Mother   . Heart disease Father     mitral valve disease/rupture  . GER disease Father   . Irritable bowel syndrome Father   . Heart failure Father   . Hepatitis C Brother   . Cancer Brother 17    ALL  . Heart disease Daughter     asd s/p repair at age 62  . Anxiety disorder Daughter   . Cancer Maternal Grandmother     ovarian cancer  . Heart disease Maternal Grandfather     MI at 47  . Kidney disease Paternal Grandfather     possible kidney cancer  . Allergies Sister   . Eczema Sister   . Cancer Sister 31    breast, DCIS  . Allergies Daughter     History   Social History  . Marital Status: Married    Spouse Name: N/A     Number of Children: N/A  . Years of Education: N/A   Occupational History  . Not on file.   Social History Main Topics  . Smoking status: Never Smoker   . Smokeless tobacco: Never Used  . Alcohol Use: No     Comment: vegetarian  . Drug Use: Not on file  . Sexual Activity: Yes    Partners: Male    Birth Control/ Protection: Other-see comments     Comment: vasectomy   Other Topics Concern  . Not on file   Social History Narrative  . No narrative on file    Current Outpatient Prescriptions on File Prior to Visit  Medication Sig Dispense Refill  . cetirizine (ZYRTEC) 10 MG tablet Take 10 mg by mouth as needed.       . Cyanocobalamin (VITAMIN B-12) 5000 MCG SUBL Place under the tongue.      . fluticasone (FLONASE) 50 MCG/ACT nasal spray Place 2 sprays into the nose daily.  16 g  3  . Multiple Vitamin (MULTI-VITAMIN DAILY PO) Take by mouth daily.      . Omega-3 Krill Oil 500  MG CAPS Take by mouth daily.      Marland Kitchen OVER THE COUNTER MEDICATION OTC eye drops      . Probiotic Product (PRO-BIOTIC BLEND PO) Take by mouth daily.       No current facility-administered medications on file prior to visit.    No Known Allergies  Review of Systems  Review of Systems  Constitutional: Negative for fever and malaise/fatigue.  HENT: Negative for congestion.   Eyes: Negative for discharge.  Respiratory: Negative for shortness of breath.   Cardiovascular: Negative for chest pain, palpitations and leg swelling.  Gastrointestinal: Negative for nausea, abdominal pain and diarrhea.  Genitourinary: Negative for dysuria.  Musculoskeletal: Negative for falls.  Skin: Negative for rash.  Neurological: Negative for loss of consciousness and headaches.  Endo/Heme/Allergies: Negative for polydipsia.  Psychiatric/Behavioral: Negative for depression and suicidal ideas. The patient is not nervous/anxious and does not have insomnia.     Objective  BP 118/72  Pulse 67  Temp(Src) 98.4 F (36.9 C)  (Oral)  Ht 5' 0.75" (1.543 m)  Wt 130 lb 1.3 oz (59.004 kg)  BMI 24.78 kg/m2  SpO2 97%  LMP 11/13/2013  Physical Exam  Physical Exam  Constitutional: She is oriented to person, place, and time and well-developed, well-nourished, and in no distress. No distress.  HENT:  Head: Normocephalic and atraumatic.  Eyes: Conjunctivae are normal.  Neck: Neck supple. No thyromegaly present.  Cardiovascular: Normal rate, regular rhythm and normal heart sounds.   No murmur heard. Pulmonary/Chest: Effort normal and breath sounds normal. She has no wheezes.  Abdominal: She exhibits no distension and no mass.  Musculoskeletal: She exhibits no edema.  Lymphadenopathy:    She has no cervical adenopathy.  Neurological: She is alert and oriented to person, place, and time.  Skin: Skin is warm and dry. No rash noted. She is not diaphoretic.  Psychiatric: Memory, affect and judgment normal.    Lab Results  Component Value Date   TSH 1.870 04/17/2013   Lab Results  Component Value Date   WBC 4.1 04/17/2013   HGB 12.1 04/17/2013   HCT 36.7 04/17/2013   MCV 84.2 04/17/2013   PLT 375 04/17/2013   Lab Results  Component Value Date   CREATININE 0.66 04/17/2013   BUN 14 04/17/2013   NA 139 04/17/2013   K 4.3 04/17/2013   CL 104 04/17/2013   CO2 25 04/17/2013   Lab Results  Component Value Date   ALT 11 04/17/2013   AST 17 04/17/2013   ALKPHOS 57 04/17/2013   BILITOT 0.6 04/17/2013   Lab Results  Component Value Date   CHOL 232* 04/17/2013   Lab Results  Component Value Date   HDL 65 04/17/2013   Lab Results  Component Value Date   LDLCALC 148* 04/17/2013   Lab Results  Component Value Date   TRIG 95 04/17/2013   Lab Results  Component Value Date   CHOLHDL 3.6 04/17/2013     Assessment & Plan  ANXIETY Stress persists but Paroxetine is helping  HYPERLIPIDEMIA Mild, continue krill oil and avoid trans fats  IRRITABLE BOWEL SYNDROME Start a probiotic and fiber powder, Paroxetine has  been helpful

## 2013-11-25 NOTE — Assessment & Plan Note (Signed)
Mild, continue krill oil and avoid trans fats

## 2013-11-25 NOTE — Assessment & Plan Note (Addendum)
Start a probiotic and fiber powder, Paroxetine has been helpful

## 2014-04-23 ENCOUNTER — Encounter: Payer: 59 | Admitting: Family Medicine

## 2014-05-31 LAB — CBC
HCT: 34.3 % — ABNORMAL LOW (ref 36.0–46.0)
Hemoglobin: 11.4 g/dL — ABNORMAL LOW (ref 12.0–15.0)
MCH: 27.9 pg (ref 26.0–34.0)
MCHC: 33.2 g/dL (ref 30.0–36.0)
MCV: 83.9 fL (ref 78.0–100.0)
Platelets: 371 K/uL (ref 150–400)
RBC: 4.09 MIL/uL (ref 3.87–5.11)
RDW: 14.7 % (ref 11.5–15.5)
WBC: 7.4 K/uL (ref 4.0–10.5)

## 2014-05-31 LAB — TSH: TSH: 2.222 u[IU]/mL (ref 0.350–4.500)

## 2014-05-31 NOTE — Telephone Encounter (Signed)
Lab order replaced for [facility]

## 2014-05-31 NOTE — Addendum Note (Signed)
Addended by: Varney Daily on: 05/31/2014 09:13 AM   Modules accepted: Orders

## 2014-06-01 LAB — RENAL FUNCTION PANEL
ALBUMIN: 4 g/dL (ref 3.5–5.2)
BUN: 9 mg/dL (ref 6–23)
CHLORIDE: 105 meq/L (ref 96–112)
CO2: 25 meq/L (ref 19–32)
Calcium: 9 mg/dL (ref 8.4–10.5)
Creat: 0.63 mg/dL (ref 0.50–1.10)
Glucose, Bld: 80 mg/dL (ref 70–99)
Phosphorus: 3.2 mg/dL (ref 2.3–4.6)
Potassium: 4.5 mEq/L (ref 3.5–5.3)
SODIUM: 136 meq/L (ref 135–145)

## 2014-06-01 LAB — HEPATIC FUNCTION PANEL
ALT: 13 U/L (ref 0–35)
AST: 17 U/L (ref 0–37)
Albumin: 4 g/dL (ref 3.5–5.2)
Alkaline Phosphatase: 60 U/L (ref 39–117)
BILIRUBIN INDIRECT: 0.4 mg/dL (ref 0.2–1.2)
Bilirubin, Direct: 0.1 mg/dL (ref 0.0–0.3)
TOTAL PROTEIN: 6.7 g/dL (ref 6.0–8.3)
Total Bilirubin: 0.5 mg/dL (ref 0.2–1.2)

## 2014-06-01 LAB — LIPID PANEL
CHOL/HDL RATIO: 3.3 ratio
CHOLESTEROL: 226 mg/dL — AB (ref 0–200)
HDL: 68 mg/dL (ref >39)
LDL Cholesterol: 130 mg/dL — ABNORMAL HIGH (ref 0–99)
TRIGLYCERIDES: 138 mg/dL (ref <150)
VLDL: 28 mg/dL (ref 0–40)

## 2014-06-03 ENCOUNTER — Encounter: Payer: Self-pay | Admitting: Family Medicine

## 2014-06-09 ENCOUNTER — Encounter: Payer: Self-pay | Admitting: Family Medicine

## 2014-06-09 ENCOUNTER — Ambulatory Visit (INDEPENDENT_AMBULATORY_CARE_PROVIDER_SITE_OTHER): Payer: 59 | Admitting: Family Medicine

## 2014-06-09 VITALS — BP 106/74 | HR 58 | Temp 98.7°F | Ht 60.75 in | Wt 142.0 lb

## 2014-06-09 DIAGNOSIS — E785 Hyperlipidemia, unspecified: Secondary | ICD-10-CM

## 2014-06-09 DIAGNOSIS — J309 Allergic rhinitis, unspecified: Secondary | ICD-10-CM

## 2014-06-09 DIAGNOSIS — F411 Generalized anxiety disorder: Secondary | ICD-10-CM

## 2014-06-09 DIAGNOSIS — D509 Iron deficiency anemia, unspecified: Secondary | ICD-10-CM

## 2014-06-09 DIAGNOSIS — K589 Irritable bowel syndrome without diarrhea: Secondary | ICD-10-CM

## 2014-06-09 DIAGNOSIS — Z0000 Encounter for general adult medical examination without abnormal findings: Secondary | ICD-10-CM

## 2014-06-09 NOTE — Assessment & Plan Note (Signed)
>>  ASSESSMENT AND PLAN FOR OTHER ALLERGIC RHINITIS WRITTEN ON 06/09/2014 11:24 AM BY BLYTH, STACEY A, MD  Stable on current meds

## 2014-06-09 NOTE — Assessment & Plan Note (Addendum)
Encouraged heart healthy diet, increase exercise, avoid trans fats, consider a flaxseeds.

## 2014-06-09 NOTE — Patient Instructions (Signed)
Cholesterol  Cholesterol is a white, waxy, fat-like protein needed by your body in small amounts. The liver makes all the cholesterol you need. Cholesterol is carried from the liver by the blood through the blood vessels. Deposits of cholesterol (plaque) may build up on blood vessel walls. These make the arteries narrower and stiffer. Cholesterol plaques increase the risk for heart attack and stroke.   You cannot feel your cholesterol level even if it is very high. The only way to know it is high is with a blood test. Once you know your cholesterol levels, you should keep a record of the test results. Work with your health care provider to keep your levels in the desired range.   WHAT DO THE RESULTS MEAN?  · Total cholesterol is a rough measure of all the cholesterol in your blood.    · LDL is the so-called bad cholesterol. This is the type that deposits cholesterol in the walls of the arteries. You want this level to be low.    · HDL is the good cholesterol because it cleans the arteries and carries the LDL away. You want this level to be high.  · Triglycerides are fat that the body can either burn for energy or store. High levels are closely linked to heart disease.    WHAT ARE THE DESIRED LEVELS OF CHOLESTEROL?  · Total cholesterol below 200.    · LDL below 100 for people at risk, below 70 for those at very high risk.    · HDL above 50 is good, above 60 is best.    · Triglycerides below 150.    HOW CAN I LOWER MY CHOLESTEROL?  · Diet. Follow your diet programs as directed by your health care provider.    ¨ Choose fish or white meat chicken and turkey, roasted or baked. Limit fatty cuts of red meat, fried foods, and processed meats, such as sausage and lunch meats.    ¨ Eat lots of fresh fruits and vegetables.  ¨ Choose whole grains, beans, pasta, potatoes, and cereals.    ¨ Use only small amounts of olive, corn, or canola oils.    ¨ Avoid butter, mayonnaise, shortening, or palm kernel oils.  ¨ Avoid foods with  trans fats.    ¨ Drink skim or nonfat milk and eat low-fat or nonfat yogurt and cheeses. Avoid whole milk, cream, ice cream, egg yolks, and full-fat cheeses.    ¨ Healthy desserts include angel food cake, ginger snaps, animal crackers, hard candy, popsicles, and low-fat or nonfat frozen yogurt. Avoid pastries, cakes, pies, and cookies.    · Exercise. Follow your exercise programs as directed by your health care provider.    ¨ A regular program helps decrease LDL and raise HDL.    ¨ A regular program helps with weight control.    ¨ Do things that increase your activity level like gardening, walking, or taking the stairs. Ask your health care provider about how you can be more active in your daily life.    · Medicine. Take medicine as directed by your health care provider.    ¨ Medicine may be prescribed by your health care provider to help lower cholesterol and decrease the risk for heart disease.    ¨ If you have several risk factors, you may need medicine even if your levels are normal.  Document Released: 09/04/2001 Document Revised: 12/15/2013 Document Reviewed: 09/23/2013  ExitCare® Patient Information ©2015 [employer]. This information is not intended to replace advice given to you by your health care provider. Make sure you discuss any questions you have with your health   care provider.

## 2014-06-09 NOTE — Assessment & Plan Note (Signed)
Stable on current meds 

## 2014-06-09 NOTE — Assessment & Plan Note (Signed)
Patient encouraged to maintain heart healthy diet, regular exercise, adequate sleep. Consider daily probiotics. Take medications as prescribed 

## 2014-06-09 NOTE — Progress Notes (Signed)
Patient ID: Tricia Potts, female   DOB: [birth year], 43 y.o.   MRN: [id number] Tricia Potts [id number] July 20, 1971 06/09/2014      Progress Note-Follow Up  Subjective  Chief Complaint  Chief Complaint  Patient presents with  . Annual Exam    physical    HPI  Patient is a 43 year old female in today for routine medical care. She is here today for annual exam. Feels well. Allergies well controlled. Anxiety well controlled. She notes that she is a frequent blood donor and had given blood right before her most recent blood draw. Otherwise she reports feeling well. No recent illness. Denies CP/palp/SOB/HA/congestion/fevers/GI or GU c/o. Taking meds as prescribed  Past Medical History  Diagnosis Date  . Allergy   . IBS (irritable bowel syndrome)   . Hemorrhoid   . Anxiety   . Hyperlipemia   . History of hidradenitis suppurativa   . Bronchitis, mucopurulent recurrent 04/21/2013  . PCO (polycystic ovaries)   . Asthma   . Broken ankle 2011    (left) roller skating    Past Surgical History  Procedure Laterality Date  . Hemorrhoid surgery    . Ankle surgery      plate and 8 screws in left ankle    Family History  Problem Relation Age of Onset  . Ovarian cancer      Grandmother  . Arthritis    . Allergies Mother   . COPD Mother   . Heart disease Father     mitral valve disease/rupture during physical stress  . GER disease Father   . Irritable bowel syndrome Father   . Heart failure Father   . Hepatitis C Brother   . Cancer Brother 17    ALL  . Heart disease Daughter     asd s/p repair at age 54  . Anxiety disorder Daughter   . Cancer Maternal Grandmother     ovarian cancer  . Heart disease Maternal Grandfather     MI at 42  . Kidney disease Paternal Grandfather     possible kidney cancer  . Allergies Sister   . Eczema Sister   . Cancer Sister 100    breast, DCIS  . Allergies Daughter     History   Social History  . Marital Status: Married    Spouse  Name: N/A    Number of Children: N/A  . Years of Education: N/A   Occupational History  . Not on file.   Social History Main Topics  . Smoking status: Never Smoker   . Smokeless tobacco: Never Used  . Alcohol Use: No     Comment: vegetarian  . Drug Use: Not on file  . Sexual Activity: Yes    Partners: Male    Birth Control/ Protection: Other-see comments     Comment: vasectomy   Other Topics Concern  . Not on file   Social History Narrative  . No narrative on file    Current Outpatient Prescriptions on File Prior to Visit  Medication Sig Dispense Refill  . cetirizine (ZYRTEC) 10 MG tablet Take 10 mg by mouth as needed.       . Cyanocobalamin (VITAMIN B-12) 5000 MCG SUBL Place under the tongue.      . fluticasone (FLONASE) 50 MCG/ACT nasal spray Place 2 sprays into the nose daily.  16 g  3  . Multiple Vitamin (MULTI-VITAMIN DAILY PO) Take by mouth daily.      . Omega-3 Krill Oil 500  MG CAPS Take by mouth daily.      Marland Kitchen OVER THE COUNTER MEDICATION OTC eye drops      . PARoxetine (PAXIL) 10 MG tablet Take 1 tablet (10 mg total) by mouth every morning.  30 tablet  11  . Probiotic Product (PRO-BIOTIC BLEND PO) Take by mouth daily.       No current facility-administered medications on file prior to visit.    No Known Allergies  Review of Systems  Review of Systems  Constitutional: Negative for fever, chills and malaise/fatigue.  HENT: Negative for congestion, hearing loss and nosebleeds.   Eyes: Negative for discharge.  Respiratory: Negative for cough, sputum production, shortness of breath and wheezing.   Cardiovascular: Negative for chest pain, palpitations and leg swelling.  Gastrointestinal: Negative for heartburn, nausea, vomiting, abdominal pain, diarrhea, constipation and blood in stool.  Genitourinary: Negative for dysuria, urgency, frequency and hematuria.  Musculoskeletal: Negative for back pain, falls and myalgias.  Skin: Negative for rash.  Neurological:  Negative for dizziness, tremors, sensory change, focal weakness, loss of consciousness, weakness and headaches.  Endo/Heme/Allergies: Negative for polydipsia. Does not bruise/bleed easily.  Psychiatric/Behavioral: Negative for depression and suicidal ideas. The patient is not nervous/anxious and does not have insomnia.     Objective  BP 106/74  Pulse 58  Temp(Src) 98.7 F (37.1 C) (Oral)  Ht 5' 0.75" (1.543 m)  Wt 142 lb (64.411 kg)  BMI 27.05 kg/m2  SpO2 98%  LMP 05/30/2014  Physical Exam  Physical Exam  Constitutional: She is oriented to person, place, and time and well-developed, well-nourished, and in no distress. No distress.  HENT:  Head: Normocephalic and atraumatic.  Right Ear: External ear normal.  Left Ear: External ear normal.  Nose: Nose normal.  Mouth/Throat: Oropharynx is clear and moist. No oropharyngeal exudate.  Eyes: Conjunctivae are normal. Pupils are equal, round, and reactive to light. Right eye exhibits no discharge. Left eye exhibits no discharge. No scleral icterus.  Neck: Normal range of motion. Neck supple. No thyromegaly present.  Cardiovascular: Normal rate, regular rhythm, normal heart sounds and intact distal pulses.   No murmur heard. Pulmonary/Chest: Effort normal and breath sounds normal. No respiratory distress. She has no wheezes. She has no rales.  Abdominal: Soft. Bowel sounds are normal. She exhibits no distension and no mass. There is no tenderness.  Musculoskeletal: Normal range of motion. She exhibits no edema and no tenderness.  Lymphadenopathy:    She has no cervical adenopathy.  Neurological: She is alert and oriented to person, place, and time. She has normal reflexes. No cranial nerve deficit. Coordination normal.  Skin: Skin is warm and dry. No rash noted. She is not diaphoretic.  Psychiatric: Mood, memory and affect normal.    Lab Results  Component Value Date   TSH 2.222 05/31/2014   Lab Results  Component Value Date   WBC  7.4 05/31/2014   HGB 11.4* 05/31/2014   HCT 34.3* 05/31/2014   MCV 83.9 05/31/2014   PLT 371 05/31/2014   Lab Results  Component Value Date   CREATININE 0.63 05/31/2014   BUN 9 05/31/2014   NA 136 05/31/2014   K 4.5 05/31/2014   CL 105 05/31/2014   CO2 25 05/31/2014   Lab Results  Component Value Date   ALT 13 05/31/2014   AST 17 05/31/2014   ALKPHOS 60 05/31/2014   BILITOT 0.5 05/31/2014   Lab Results  Component Value Date   CHOL 226* 05/31/2014   Lab Results  Component Value Date   HDL 68 05/31/2014   Lab Results  Component Value Date   LDLCALC 130* 05/31/2014   Lab Results  Component Value Date   TRIG 138 05/31/2014   Lab Results  Component Value Date   CHOLHDL 3.3 05/31/2014     Assessment & Plan  ANXIETY Doing well on  Paxil no changes  HYPERLIPIDEMIA Encouraged heart healthy diet, increase exercise, avoid trans fats, consider a flaxseeds.  Preventative health care Patient encouraged to maintain heart healthy diet, regular exercise, adequate sleep. Consider daily probiotics. Take medications as prescribed  IRRITABLE BOWEL SYNDROME Encouraged to restart probiotics, increase fiber and fluids  ALLERGIC RHINITIS Stable on current meds  Anemia Likely due to donating blood frequently. Increase leafy greens, consider increased lean red meat and using cast iron cookware. Continue to monitor, report any concerns

## 2014-06-09 NOTE — Assessment & Plan Note (Signed)
Encouraged to restart probiotics, increase fiber and fluids

## 2014-06-09 NOTE — Assessment & Plan Note (Signed)
Doing well on  Paxil no changes

## 2014-06-09 NOTE — Progress Notes (Signed)
Pre visit review using our clinic review tool, if applicable. No additional management support is needed unless otherwise documented below in the visit note. 

## 2014-06-11 ENCOUNTER — Encounter: Payer: Self-pay | Admitting: Gynecology

## 2014-06-16 ENCOUNTER — Encounter: Payer: Self-pay | Admitting: Family Medicine

## 2014-06-17 ENCOUNTER — Telehealth: Payer: Self-pay | Admitting: Family Medicine

## 2014-06-17 NOTE — Telephone Encounter (Signed)
Patient left message wanting to know if she has had a TDAP or TB in the last 10 years, if not she would like to schedule an appointment, if she has she needs the records faxed, she did not leave the fax number

## 2014-06-18 ENCOUNTER — Encounter: Payer: Self-pay | Admitting: Family Medicine

## 2014-06-18 DIAGNOSIS — D649 Anemia, unspecified: Secondary | ICD-10-CM

## 2014-06-18 HISTORY — DX: Anemia, unspecified: D64.9

## 2014-06-18 NOTE — Telephone Encounter (Signed)
Pt informed that TD was given in 2011 and that we don't have record of TB.  Pt voiced understanding

## 2014-06-18 NOTE — Assessment & Plan Note (Signed)
Likely due to donating blood frequently. Increase leafy greens, consider increased lean red meat and using cast iron cookware. Continue to monitor, report any concerns

## 2014-06-21 ENCOUNTER — Encounter: Payer: Self-pay | Admitting: Gynecology

## 2014-06-21 ENCOUNTER — Ambulatory Visit (INDEPENDENT_AMBULATORY_CARE_PROVIDER_SITE_OTHER): Payer: 59

## 2014-06-21 DIAGNOSIS — Z23 Encounter for immunization: Secondary | ICD-10-CM

## 2014-06-21 NOTE — Progress Notes (Signed)
   Subjective:    Patient ID: Tricia Potts, female    DOB: 24-Aug-1971, 43 y.o.   MRN: [id number]  HPI    Review of Systems     Objective:   Physical Exam        Assessment & Plan:  Patient came in today for her tdap injection. Pt tolerated injection well

## 2014-06-28 ENCOUNTER — Telehealth: Payer: Self-pay

## 2014-06-28 ENCOUNTER — Encounter: Payer: 59 | Admitting: Family Medicine

## 2014-06-28 NOTE — Telephone Encounter (Signed)
Pt left a message stating she needed her immunization report faxed to occupational health.   I left a message for patient to return my call to inform her that we can't fax this information. Pt can go onto mychart to print this information or can come in and pick up.  Pt returned my call and stated that she told Marj to tell me to disregard this phonecall because she had come by to get this already

## 2014-07-19 ENCOUNTER — Ambulatory Visit: Payer: 59 | Admitting: Gynecology

## 2014-07-26 ENCOUNTER — Ambulatory Visit: Payer: 59 | Admitting: Gynecology

## 2014-07-27 ENCOUNTER — Ambulatory Visit (INDEPENDENT_AMBULATORY_CARE_PROVIDER_SITE_OTHER): Payer: 59 | Admitting: Gynecology

## 2014-07-27 ENCOUNTER — Encounter: Payer: Self-pay | Admitting: Gynecology

## 2014-07-27 VITALS — BP 98/60 | HR 66 | Resp 14 | Ht 61.0 in | Wt 141.0 lb

## 2014-07-27 DIAGNOSIS — Z124 Encounter for screening for malignant neoplasm of cervix: Secondary | ICD-10-CM

## 2014-07-27 DIAGNOSIS — Z01419 Encounter for gynecological examination (general) (routine) without abnormal findings: Secondary | ICD-10-CM

## 2014-07-27 NOTE — Progress Notes (Signed)
43 y.o. Married Caucasian female   G2P2003 here for annual exam. Pt is currently sexually active.  Pt is without complaints.    Patient's last menstrual period was 07/20/2014.          Sexually active: Yes.    The current method of family planning is Husband has Vasectomy.    Exercising: No.  The patient does not participate in regular exercise at present. Last pap: 07/17/13 NEG + HPV Alcohol: no Tobacco: no BSE: no  Mammogram: 07/16/14 Normal  Labs: Gwyneth Revels, MD    Health Maintenance  Topic Date Due  . Mammogram  07/13/2014  . Influenza Vaccine  07/24/2014  . Pap Smear  07/17/2016  . Tetanus/tdap  06/21/2024    Family History  Problem Relation Age of Onset  . Ovarian cancer      Grandmother  . Arthritis    . Allergies Mother   . COPD Mother   . Heart disease Father     mitral valve disease/rupture during physical stress  . GER disease Father   . Irritable bowel syndrome Father   . Heart failure Father   . Hepatitis C Brother   . Cancer Brother 17    ALL  . Heart disease Daughter     asd s/p repair at age 18  . Anxiety disorder Daughter   . Cancer Maternal Grandmother     ovarian cancer  . Heart disease Maternal Grandfather     MI at 40  . Kidney disease Paternal Grandfather     possible kidney cancer  . Allergies Sister   . Eczema Sister   . Cancer Sister 55    breast, DCIS  . Allergies Daughter     Patient Active Problem List   Diagnosis Date Noted  . Anemia 06/18/2014  . Preventative health care 04/23/2013  . Bronchitis, mucopurulent recurrent 04/21/2013  . Acne 04/21/2013  . ASTHMA 01/22/2011  . HYPERLIPIDEMIA 02/17/2009  . ANXIETY 02/17/2009  . HEMORRHOIDS 02/17/2009  . ALLERGIC RHINITIS 02/17/2009  . IRRITABLE BOWEL SYNDROME 02/17/2009    Past Medical History  Diagnosis Date  . Allergy   . IBS (irritable bowel syndrome)   . Hemorrhoid   . Anxiety   . Hyperlipemia   . History of hidradenitis suppurativa   . Bronchitis, mucopurulent  recurrent 04/21/2013  . PCO (polycystic ovaries)   . Asthma   . Broken ankle 2011    (left) roller skating  . Anemia 06/18/2014    Past Surgical History  Procedure Laterality Date  . Hemorrhoid surgery    . Ankle surgery      plate and 8 screws in left ankle    Allergies: Review of patient's allergies indicates no known allergies.  Current Outpatient Prescriptions  Medication Sig Dispense Refill  . cetirizine (ZYRTEC) 10 MG tablet Take 10 mg by mouth as needed.       . Cyanocobalamin (VITAMIN B-12) 5000 MCG SUBL Place under the tongue.      . fluticasone (FLONASE) 50 MCG/ACT nasal spray Place 2 sprays into the nose daily.  16 g  3  . Multiple Vitamin (MULTI-VITAMIN DAILY PO) Take by mouth daily.      . Omega-3 Krill Oil 500 MG CAPS Take by mouth daily.      Marland Kitchen OVER THE COUNTER MEDICATION OTC eye drops      . PARoxetine (PAXIL) 10 MG tablet Take 1 tablet (10 mg total) by mouth every morning.  30 tablet  11  . Probiotic Product (  PRO-BIOTIC BLEND PO) Take by mouth daily.       No current facility-administered medications for this visit.    ROS: Pertinent items are noted in HPI.  Exam:    BP 98/60  Pulse 66  Resp 14  Ht 5\' 1"  (1.549 m)  Wt 141 lb (63.957 kg)  BMI 26.66 kg/m2  LMP 07/20/2014 Weight change: @WEIGHTCHANGE @ Last 3 height recordings:  Ht Readings from Last 3 Encounters:  07/27/14 5\' 1"  (1.549 m)  06/09/14 5' 0.75" (1.543 m)  11/24/13 5' 0.75" (1.543 m)   General appearance: alert, cooperative and appears stated age Head: Normocephalic, without obvious abnormality, atraumatic Neck: no adenopathy, no carotid bruit, no JVD, supple, symmetrical, trachea midline and thyroid not enlarged, symmetric, no tenderness/mass/nodules Lungs: clear to auscultation bilaterally Breasts: normal appearance, no masses or tenderness Heart: regular rate and rhythm, S1, S2 normal, no murmur, click, rub or gallop Abdomen: soft, non-tender; bowel sounds normal; no masses,  no  organomegaly Extremities: extremities normal, atraumatic, no cyanosis or edema Skin: Skin color, texture, turgor normal. No rashes or lesions Lymph nodes: Cervical, supraclavicular, and axillary nodes normal. no inguinal nodes palpated Neurologic: Grossly normal   Pelvic: External genitalia:  no lesions              Urethra: normal appearing urethra with no masses, tenderness or lesions              Bartholins and Skenes: Bartholin's, Urethra, Skene's normal                 Vagina: normal appearing vagina with normal color and discharge, no lesions              Cervix: normal appearance              Pap taken: Yes.          Bimanual Exam:  Uterus:  uterus is normal size, shape, consistency and nontender, enlarged to 10 week's size                                      Adnexa:    no masses                                      Rectovaginal: Confirms                                      Anus:  normal sphincter tone, no lesions    1. Routine gynecological examination Mammogram, BSE stressed Enlarged uterus no fibroids, watch cycles counseled on mammography screening, adequate intake of calcium and vitamin D, diet and exercise return annually or prn   2. Screening for cervical cancer +HPV last pap, repeat  - Pap Test with HP (IPS)  An After Visit Summary was printed and given to the patient.

## 2014-07-29 ENCOUNTER — Encounter: Payer: Self-pay | Admitting: Family Medicine

## 2014-07-29 LAB — IPS PAP TEST WITH HPV

## 2014-07-29 NOTE — Telephone Encounter (Signed)
Please adivse? I pulled my call log and the person that left the message stated her name was [place] and her callback number was [phone]

## 2014-08-03 ENCOUNTER — Other Ambulatory Visit: Payer: Self-pay | Admitting: Family Medicine

## 2014-09-21 ENCOUNTER — Other Ambulatory Visit: Payer: Self-pay

## 2014-09-21 DIAGNOSIS — F411 Generalized anxiety disorder: Secondary | ICD-10-CM

## 2014-09-21 DIAGNOSIS — K589 Irritable bowel syndrome without diarrhea: Secondary | ICD-10-CM

## 2014-09-21 MED ORDER — PAROXETINE HCL 10 MG PO TABS
10.0000 mg | ORAL_TABLET | ORAL | Status: DC
Start: 2014-09-21 — End: 2014-11-22

## 2014-10-03 ENCOUNTER — Encounter: Payer: Self-pay | Admitting: Family Medicine

## 2014-10-13 ENCOUNTER — Encounter: Payer: Self-pay | Admitting: Family Medicine

## 2014-10-14 NOTE — Telephone Encounter (Signed)
Please advise? It looks like you signed off the other mychart message that was sent. I don't see a response though?

## 2014-10-25 ENCOUNTER — Encounter: Payer: Self-pay | Admitting: Gynecology

## 2014-10-25 ENCOUNTER — Ambulatory Visit (INDEPENDENT_AMBULATORY_CARE_PROVIDER_SITE_OTHER): Payer: 59

## 2014-10-25 ENCOUNTER — Ambulatory Visit: Payer: 59

## 2014-10-25 DIAGNOSIS — Z23 Encounter for immunization: Secondary | ICD-10-CM

## 2014-10-25 NOTE — Progress Notes (Signed)
Pre visit review using our clinic review tool, if applicable. No additional management support is needed unless otherwise documented below in the visit note. 

## 2014-10-25 NOTE — Progress Notes (Signed)
Pt tolerated injection well.  No signs of reaction upon leaving the clinic.

## 2014-11-11 ENCOUNTER — Encounter: Payer: Self-pay | Admitting: Family Medicine

## 2014-11-22 ENCOUNTER — Other Ambulatory Visit: Payer: Self-pay | Admitting: Family Medicine

## 2014-11-22 ENCOUNTER — Encounter: Payer: Self-pay | Admitting: Family Medicine

## 2014-11-22 MED ORDER — PAROXETINE HCL 20 MG PO TABS: 20.0000 mg | ORAL_TABLET | Freq: Every day | ORAL | Status: DC

## 2014-11-22 NOTE — Telephone Encounter (Signed)
Please advise the paxil 20 mg? Pt states she is taking 10 mg (2 tabs) i don't see that the instructions are 2 tabs daily?

## 2014-11-22 NOTE — Progress Notes (Signed)
Pt was made aware.  Pt stated she would call back to schedule the follow up appointment.

## 2014-11-24 ENCOUNTER — Telehealth: Payer: Self-pay | Admitting: Gynecology

## 2014-11-24 NOTE — Telephone Encounter (Signed)
Left message regarding upcoming appointment has been canceled and needs to be rescheduled. °

## 2015-01-04 ENCOUNTER — Ambulatory Visit (INDEPENDENT_AMBULATORY_CARE_PROVIDER_SITE_OTHER): Payer: 59 | Admitting: Family Medicine

## 2015-01-04 ENCOUNTER — Encounter: Payer: Self-pay | Admitting: Family Medicine

## 2015-01-04 VITALS — BP 113/76 | HR 63 | Temp 98.3°F | Ht 61.0 in | Wt 148.4 lb

## 2015-01-04 DIAGNOSIS — E782 Mixed hyperlipidemia: Secondary | ICD-10-CM

## 2015-01-04 DIAGNOSIS — L709 Acne, unspecified: Secondary | ICD-10-CM

## 2015-01-04 DIAGNOSIS — F411 Generalized anxiety disorder: Secondary | ICD-10-CM

## 2015-01-04 MED ORDER — CLINDAMYCIN PHOS-BENZOYL PEROX 1-5 % EX GEL: Freq: Two times a day (BID) | CUTANEOUS | Status: DC

## 2015-01-04 MED ORDER — PAROXETINE HCL 20 MG PO TABS: 20.0000 mg | ORAL_TABLET | Freq: Every day | ORAL | Status: DC

## 2015-01-04 NOTE — Progress Notes (Signed)
Pre visit review using our clinic review tool, if applicable. No additional management support is needed unless otherwise documented below in the visit note. 

## 2015-01-04 NOTE — Patient Instructions (Signed)
differin gel is another option for the acne

## 2015-01-09 ENCOUNTER — Encounter: Payer: Self-pay | Admitting: Family Medicine

## 2015-01-09 NOTE — Assessment & Plan Note (Signed)
Encouraged heart healthy diet, increase exercise, avoid trans fats, consider a krill oil cap daily 

## 2015-01-09 NOTE — Progress Notes (Signed)
Tricia Potts  [id number] 06/22/1971 01/09/2015      Progress Note-Follow Up  Subjective  Chief Complaint  Chief Complaint  Patient presents with  . Follow-up    HPI  Patient is a 44 y.o. female in today for routine medical care. Patient in today for routine follow-up doing fairly well. Her greatest complaint is of persistent acne. She is to used doxycycline in the past with marginal results. Has tried numerous over-the-counter medications without success. No recent illness. Typical flu shot with work in October 2015. Denies CP/palp/SOB/HA/congestion/fevers/GI or GU c/o. Taking meds as prescribed  Past Medical History  Diagnosis Date  . Allergy   . IBS (irritable bowel syndrome)   . Hemorrhoid   . Anxiety   . Hyperlipemia   . History of hidradenitis suppurativa   . Bronchitis, mucopurulent recurrent 04/21/2013  . PCO (polycystic ovaries)   . Asthma   . Broken ankle 2011    (left) roller skating  . Anemia 06/18/2014    Past Surgical History  Procedure Laterality Date  . Hemorrhoid surgery    . Ankle surgery      plate and 8 screws in left ankle    Family History  Problem Relation Age of Onset  . Ovarian cancer      Grandmother  . Arthritis    . Allergies Mother   . COPD Mother   . Heart disease Father     mitral valve disease/rupture during physical stress  . GER disease Father   . Irritable bowel syndrome Father   . Heart failure Father   . Hepatitis C Brother   . Cancer Brother 17    ALL  . Heart disease Daughter     asd s/p repair at age 58  . Anxiety disorder Daughter   . Cancer Maternal Grandmother     ovarian cancer  . Heart disease Maternal Grandfather     MI at 68  . Kidney disease Paternal Grandfather     possible kidney cancer  . Allergies Sister   . Eczema Sister   . Cancer Sister 23    breast, DCIS  . Allergies Daughter     History   Social History  . Marital Status: Married    Spouse Name: N/A    Number of Children: N/A  .  Years of Education: N/A   Occupational History  . Not on file.   Social History Main Topics  . Smoking status: Never Smoker   . Smokeless tobacco: Never Used  . Alcohol Use: No     Comment: vegetarian  . Drug Use: No  . Sexual Activity:    Partners: Male    Birth Control/ Protection: Other-see comments     Comment: vasectomy   Other Topics Concern  . Not on file   Social History Narrative    Current Outpatient Prescriptions on File Prior to Visit  Medication Sig Dispense Refill  . Calcium Carbonate-Vitamin D (CALCIUM + D PO) Take by mouth.    . cetirizine (ZYRTEC) 10 MG tablet Take 10 mg by mouth as needed.     . Cyanocobalamin (VITAMIN B-12) 5000 MCG SUBL Place under the tongue.    . Flaxseed, Linseed, (FLAXSEED OIL PO) Take by mouth.    . fluticasone (FLONASE) 50 MCG/ACT nasal spray PLACE 2 SPRAYS INTO THE NOSE DAILY 16 g 5  . Multiple Vitamin (MULTI-VITAMIN DAILY PO) Take by mouth daily.    Marland Kitchen OVER THE COUNTER MEDICATION OTC eye drops    .  Probiotic Product (PRO-BIOTIC BLEND PO) Take by mouth daily.     No current facility-administered medications on file prior to visit.    No Known Allergies  Review of Systems  Review of Systems  Constitutional: Negative for fever and malaise/fatigue.  HENT: Negative for congestion.   Eyes: Negative for discharge.  Respiratory: Negative for shortness of breath.   Cardiovascular: Negative for chest pain, palpitations and leg swelling.  Gastrointestinal: Negative for nausea, abdominal pain and diarrhea.  Genitourinary: Negative for dysuria.  Musculoskeletal: Negative for falls.  Skin: Negative for rash.  Neurological: Negative for loss of consciousness and headaches.  Endo/Heme/Allergies: Negative for polydipsia.  Psychiatric/Behavioral: Negative for depression and suicidal ideas. The patient is not nervous/anxious and does not have insomnia.     Objective  BP 113/76 mmHg  Pulse 63  Temp(Src) 98.3 F (36.8 C) (Oral)  Ht  5\' 1"  (1.549 m)  Wt 148 lb 6.4 oz (67.314 kg)  BMI 28.05 kg/m2  SpO2 99%  LMP 12/31/2014  Physical Exam  Physical Exam  Constitutional: She is oriented to person, place, and time and well-developed, well-nourished, and in no distress. No distress.  HENT:  Head: Normocephalic and atraumatic.  Eyes: Conjunctivae are normal.  Neck: Neck supple. No thyromegaly present.  Cardiovascular: Normal rate, regular rhythm and normal heart sounds.   No murmur heard. Pulmonary/Chest: Effort normal and breath sounds normal. She has no wheezes.  Abdominal: She exhibits no distension and no mass.  Musculoskeletal: She exhibits no edema.  Lymphadenopathy:    She has no cervical adenopathy.  Neurological: She is alert and oriented to person, place, and time.  Skin: Skin is warm and dry. No rash noted. She is not diaphoretic.  Psychiatric: Memory, affect and judgment normal.    Lab Results  Component Value Date   TSH 2.222 05/31/2014   Lab Results  Component Value Date   WBC 7.4 05/31/2014   HGB 11.4* 05/31/2014   HCT 34.3* 05/31/2014   MCV 83.9 05/31/2014   PLT 371 05/31/2014   Lab Results  Component Value Date   CREATININE 0.63 05/31/2014   BUN 9 05/31/2014   NA 136 05/31/2014   K 4.5 05/31/2014   CL 105 05/31/2014   CO2 25 05/31/2014   Lab Results  Component Value Date   ALT 13 05/31/2014   AST 17 05/31/2014   ALKPHOS 60 05/31/2014   BILITOT 0.5 05/31/2014   Lab Results  Component Value Date   CHOL 226* 05/31/2014   Lab Results  Component Value Date   HDL 68 05/31/2014   Lab Results  Component Value Date   LDLCALC 130* 05/31/2014   Lab Results  Component Value Date   TRIG 138 05/31/2014   Lab Results  Component Value Date   CHOLHDL 3.3 05/31/2014     Assessment & Plan  Acne Patient frustrated with failure of numerous over the counter acne treatments. Try Benzaclin bid   Anxiety state  Good response to Paroxetine. Continue same   Hyperlipidemia,  mixed Encouraged heart healthy diet, increase exercise, avoid trans fats, consider a krill oil cap daily

## 2015-01-09 NOTE — Assessment & Plan Note (Signed)
Patient frustrated with failure of numerous over the counter acne treatments. Try Benzaclin bid

## 2015-01-09 NOTE — Assessment & Plan Note (Signed)
Good response to Paroxetine. Continue same

## 2015-05-25 ENCOUNTER — Telehealth: Payer: Self-pay | Admitting: Family Medicine

## 2015-05-25 NOTE — Telephone Encounter (Signed)
Pre Visit letter sent  °

## 2015-06-06 ENCOUNTER — Telehealth: Payer: Self-pay | Admitting: Family Medicine

## 2015-06-06 DIAGNOSIS — E782 Mixed hyperlipidemia: Secondary | ICD-10-CM

## 2015-06-06 DIAGNOSIS — D6489 Other specified anemias: Secondary | ICD-10-CM

## 2015-06-06 DIAGNOSIS — Z0000 Encounter for general adult medical examination without abnormal findings: Secondary | ICD-10-CM

## 2015-06-06 NOTE — Telephone Encounter (Signed)
Labs ordered and patient informed

## 2015-06-06 NOTE — Telephone Encounter (Signed)
Advise and I will order

## 2015-06-06 NOTE — Telephone Encounter (Signed)
Lipid, cmp, tsh, cbc for anemia, mixed hyperlipidemia and preventative exam. Tricia Potts

## 2015-06-06 NOTE — Telephone Encounter (Signed)
Pt scheduled  lab appointment for 06/08/15 prior to physical 06/13/15. Requesting orders.

## 2015-06-08 ENCOUNTER — Other Ambulatory Visit (INDEPENDENT_AMBULATORY_CARE_PROVIDER_SITE_OTHER): Payer: 59

## 2015-06-08 DIAGNOSIS — D6489 Other specified anemias: Secondary | ICD-10-CM | POA: Diagnosis not present

## 2015-06-08 DIAGNOSIS — Z0000 Encounter for general adult medical examination without abnormal findings: Secondary | ICD-10-CM | POA: Diagnosis not present

## 2015-06-08 DIAGNOSIS — E782 Mixed hyperlipidemia: Secondary | ICD-10-CM | POA: Diagnosis not present

## 2015-06-08 LAB — COMPREHENSIVE METABOLIC PANEL
ALBUMIN: 4.1 g/dL (ref 3.5–5.2)
ALT: 17 U/L (ref 0–35)
AST: 17 U/L (ref 0–37)
Alkaline Phosphatase: 63 U/L (ref 39–117)
BUN: 10 mg/dL (ref 6–23)
CALCIUM: 9.1 mg/dL (ref 8.4–10.5)
CHLORIDE: 103 meq/L (ref 96–112)
CO2: 27 meq/L (ref 19–32)
Creatinine, Ser: 0.71 mg/dL (ref 0.40–1.20)
GFR: 94.08 mL/min (ref >60.00)
GLUCOSE: 90 mg/dL (ref 70–99)
POTASSIUM: 4.2 meq/L (ref 3.5–5.1)
SODIUM: 136 meq/L (ref 135–145)
TOTAL PROTEIN: 7 g/dL (ref 6.0–8.3)
Total Bilirubin: 0.3 mg/dL (ref 0.2–1.2)

## 2015-06-08 LAB — CBC WITH DIFFERENTIAL/PLATELET
Basophils Absolute: 0 10*3/uL (ref 0.0–0.1)
Basophils Relative: 0.6 % (ref 0.0–3.0)
Eosinophils Absolute: 0.3 10*3/uL (ref 0.0–0.7)
Eosinophils Relative: 5.1 % — ABNORMAL HIGH (ref 0.0–5.0)
HCT: 37 % (ref 36.0–46.0)
Hemoglobin: 12.4 g/dL (ref 12.0–15.0)
LYMPHS ABS: 1.3 10*3/uL (ref 0.7–4.0)
Lymphocytes Relative: 24.4 % (ref 12.0–46.0)
MCHC: 33.5 g/dL (ref 30.0–36.0)
MCV: 86.2 fl (ref 78.0–100.0)
Monocytes Absolute: 0.4 10*3/uL (ref 0.1–1.0)
Monocytes Relative: 7.9 % (ref 3.0–12.0)
NEUTROS ABS: 3.4 10*3/uL (ref 1.4–7.7)
Neutrophils Relative %: 62 % (ref 43.0–77.0)
PLATELETS: 350 10*3/uL (ref 150.0–400.0)
RBC: 4.3 Mil/uL (ref 3.87–5.11)
RDW: 15.2 % (ref 11.5–15.5)
WBC: 5.5 10*3/uL (ref 4.0–10.5)

## 2015-06-08 LAB — LIPID PANEL
Cholesterol: 259 mg/dL — ABNORMAL HIGH (ref 0–200)
HDL: 69.4 mg/dL (ref >39.00)
LDL Cholesterol: 162 mg/dL — ABNORMAL HIGH (ref 0–99)
NONHDL: 189.6
Total CHOL/HDL Ratio: 4
Triglycerides: 140 mg/dL (ref 0.0–149.0)
VLDL: 28 mg/dL (ref 0.0–40.0)

## 2015-06-08 LAB — TSH: TSH: 2.7 u[IU]/mL (ref 0.35–4.50)

## 2015-06-10 ENCOUNTER — Telehealth: Payer: Self-pay | Admitting: Behavioral Health

## 2015-06-10 NOTE — Telephone Encounter (Signed)
Unable to reach patient at time of Pre-Visit Call.  Left message for patient to return call when available.    

## 2015-06-13 ENCOUNTER — Encounter: Payer: 59 | Admitting: Family Medicine

## 2015-06-13 ENCOUNTER — Ambulatory Visit (INDEPENDENT_AMBULATORY_CARE_PROVIDER_SITE_OTHER): Payer: 59 | Admitting: Family Medicine

## 2015-06-13 ENCOUNTER — Encounter: Payer: Self-pay | Admitting: Family Medicine

## 2015-06-13 VITALS — BP 108/68 | HR 68 | Temp 98.7°F | Ht 61.0 in | Wt 152.1 lb

## 2015-06-13 DIAGNOSIS — D509 Iron deficiency anemia, unspecified: Secondary | ICD-10-CM

## 2015-06-13 DIAGNOSIS — E782 Mixed hyperlipidemia: Secondary | ICD-10-CM

## 2015-06-13 DIAGNOSIS — Z0000 Encounter for general adult medical examination without abnormal findings: Secondary | ICD-10-CM

## 2015-06-13 DIAGNOSIS — F411 Generalized anxiety disorder: Secondary | ICD-10-CM

## 2015-06-13 MED ORDER — PAROXETINE HCL 20 MG PO TABS: 20.0000 mg | ORAL_TABLET | Freq: Every day | ORAL | Status: DC

## 2015-06-13 NOTE — Progress Notes (Signed)
Tricia Potts  [id number] [birth year] 06/13/2015      Progress Note-Follow Up  Subjective  Chief Complaint  Chief Complaint  Patient presents with  . Annual Exam    HPI  Patient is a 44 y.o. female in today for routine medical care.  Patient is in today for  Annual exam. Overall is doing well. No recent illness.  Denies any acute concerns. Reports Paxil is working well and she requests a refill. Denies CP/palp/SOB/HA/congestion/fevers/GI or GU c/o. Taking meds as prescribed  Past Medical History  Diagnosis Date  . Allergy   . IBS (irritable bowel syndrome)   . Hemorrhoid   . Anxiety   . Hyperlipemia   . History of hidradenitis suppurativa   . Bronchitis, mucopurulent recurrent 04/21/2013  . PCO (polycystic ovaries)   . Asthma   . Broken ankle 2011    (left) roller skating  . Anemia 06/18/2014    Past Surgical History  Procedure Laterality Date  . Hemorrhoid surgery    . Ankle surgery      plate and 8 screws in left ankle    Family History  Problem Relation Age of Onset  . Ovarian cancer      Grandmother  . Arthritis    . Allergies Mother   . COPD Mother   . Heart disease Father     mitral valve disease/rupture during physical stress  . GER disease Father   . Irritable bowel syndrome Father   . Heart failure Father   . Hepatitis C Brother   . Cancer Brother 17    ALL  . Heart disease Daughter     asd s/p repair at age 55  . Anxiety disorder Daughter   . Cancer Maternal Grandmother     ovarian cancer  . Heart disease Maternal Grandfather     MI at 66  . Kidney disease Paternal Grandfather     possible kidney cancer  . Allergies Sister   . Eczema Sister   . Cancer Sister 52    breast, DCIS  . Allergies Daughter     History   Social History  . Marital Status: Married    Spouse Name: N/A  . Number of Children: N/A  . Years of Education: N/A   Occupational History  . Not on file.   Social History Main Topics  . Smoking status: Never  Smoker   . Smokeless tobacco: Never Used  . Alcohol Use: No     Comment: vegetarian  . Drug Use: No  . Sexual Activity:    Partners: Male    Birth Control/ Protection: Other-see comments     Comment: vasectomy   Other Topics Concern  . Not on file   Social History Narrative    Current Outpatient Prescriptions on File Prior to Visit  Medication Sig Dispense Refill  . cetirizine (ZYRTEC) 10 MG tablet Take 10 mg by mouth as needed.     . Flaxseed, Linseed, (FLAXSEED OIL PO) Take by mouth.    . fluticasone (FLONASE) 50 MCG/ACT nasal spray PLACE 2 SPRAYS INTO THE NOSE DAILY 16 g 5  . Multiple Vitamin (MULTI-VITAMIN DAILY PO) Take by mouth daily.    Marland Kitchen OVER THE COUNTER MEDICATION OTC eye drops    . PARoxetine (PAXIL) 20 MG tablet Take 1 tablet (20 mg total) by mouth daily. 30 tablet 5  . Probiotic Product (PRO-BIOTIC BLEND PO) Take by mouth daily.     No current facility-administered medications on file prior to  visit.    No Known Allergies  Review of Systems  Review of Systems  Constitutional: Negative for fever, chills and malaise/fatigue.  HENT: Negative for congestion, hearing loss and nosebleeds.   Eyes: Negative for discharge.  Respiratory: Negative for cough, sputum production, shortness of breath and wheezing.   Cardiovascular: Negative for chest pain, palpitations and leg swelling.  Gastrointestinal: Negative for heartburn, nausea, vomiting, abdominal pain, diarrhea, constipation and blood in stool.  Genitourinary: Negative for dysuria, urgency, frequency and hematuria.  Musculoskeletal: Negative for myalgias, back pain and falls.  Skin: Negative for rash.  Neurological: Negative for dizziness, tremors, sensory change, focal weakness, loss of consciousness, weakness and headaches.  Endo/Heme/Allergies: Negative for polydipsia. Does not bruise/bleed easily.  Psychiatric/Behavioral: Negative for depression and suicidal ideas. The patient is not nervous/anxious and does  not have insomnia.     Objective  BP 108/68 mmHg  Pulse 68  Temp(Src) 98.7 F (37.1 C) (Oral)  Ht 5\' 1"  (1.549 m)  Wt 152 lb 2 oz (69.003 kg)  BMI 28.76 kg/m2  SpO2 97%  LMP 06/11/2015  Physical Exam  Physical Exam  Constitutional: She is oriented to person, place, and time and well-developed, well-nourished, and in no distress. No distress.  HENT:  Head: Normocephalic and atraumatic.  Right Ear: External ear normal.  Left Ear: External ear normal.  Nose: Nose normal.  Mouth/Throat: Oropharynx is clear and moist. No oropharyngeal exudate.  Eyes: Conjunctivae are normal. Pupils are equal, round, and reactive to light. Right eye exhibits no discharge. Left eye exhibits no discharge. No scleral icterus.  Neck: Normal range of motion. Neck supple. No thyromegaly present.  Cardiovascular: Normal rate, regular rhythm, normal heart sounds and intact distal pulses.   No murmur heard. Pulmonary/Chest: Effort normal and breath sounds normal. No respiratory distress. She has no wheezes. She has no rales.  Abdominal: Soft. Bowel sounds are normal. She exhibits no distension and no mass. There is no tenderness.  Musculoskeletal: Normal range of motion. She exhibits no edema or tenderness.  Lymphadenopathy:    She has no cervical adenopathy.  Neurological: She is alert and oriented to person, place, and time. She has normal reflexes. No cranial nerve deficit. Coordination normal.  Skin: Skin is warm and dry. No rash noted. She is not diaphoretic.  Psychiatric: Mood, memory and affect normal.    Lab Results  Component Value Date   TSH 2.70 06/08/2015   Lab Results  Component Value Date   WBC 5.5 06/08/2015   HGB 12.4 06/08/2015   HCT 37.0 06/08/2015   MCV 86.2 06/08/2015   PLT 350.0 06/08/2015   Lab Results  Component Value Date   CREATININE 0.71 06/08/2015   BUN 10 06/08/2015   NA 136 06/08/2015   K 4.2 06/08/2015   CL 103 06/08/2015   CO2 27 06/08/2015   Lab Results    Component Value Date   ALT 17 06/08/2015   AST 17 06/08/2015   ALKPHOS 63 06/08/2015   BILITOT 0.3 06/08/2015   Lab Results  Component Value Date   CHOL 259* 06/08/2015   Lab Results  Component Value Date   HDL 69.40 06/08/2015   Lab Results  Component Value Date   LDLCALC 162* 06/08/2015   Lab Results  Component Value Date   TRIG 140.0 06/08/2015   Lab Results  Component Value Date   CHOLHDL 4 06/08/2015     Assessment & Plan  Hyperlipidemia, mixed encouraged heart healthy diet, avoid trans fats, minimize simple carbs  and saturated fats. Increase exercise as tolerated. Continue Flaxseed oil.  Anxiety state Doing well on Paxil, continue the same  Anemia Increase leafy greens, consider increased lean red meat and using cast iron cookware. Continue to monitor, report any concerns  Preventative health care Patient encouraged to maintain heart healthy diet, regular exercise, adequate sleep. Consider daily probiotics. Take medications as prescribed. Given and reviewed copy of ACP documents from [employer] and encouraged to complete and return

## 2015-06-13 NOTE — Assessment & Plan Note (Signed)
Doing well on Paxil, continue the same

## 2015-06-13 NOTE — Patient Instructions (Signed)
Preventive Care for Adults A healthy lifestyle and preventive care can promote health and wellness. Preventive health guidelines for women include the following key practices.  A routine yearly physical is a good way to check with your health care provider about your health and preventive screening. It is a chance to share any concerns and updates on your health and to receive a thorough exam.  Visit your dentist for a routine exam and preventive care every 6 months. Brush your teeth twice a day and floss once a day. Good oral hygiene prevents tooth decay and gum disease.  The frequency of eye exams is based on your age, health, family medical history, use of contact lenses, and other factors. Follow your health care provider's recommendations for frequency of eye exams.  Eat a healthy diet. Foods like vegetables, fruits, whole grains, low-fat dairy products, and lean protein foods contain the nutrients you need without too many calories. Decrease your intake of foods high in solid fats, added sugars, and salt. Eat the right amount of calories for you.Get information about a proper diet from your health care provider, if necessary.  Regular physical exercise is one of the most important things you can do for your health. Most adults should get at least 150 minutes of moderate-intensity exercise (any activity that increases your heart rate and causes you to sweat) each week. In addition, most adults need muscle-strengthening exercises on 2 or more days a week.  Maintain a healthy weight. The body mass index (BMI) is a screening tool to identify possible weight problems. It provides an estimate of body fat based on height and weight. Your health care provider can find your BMI and can help you achieve or maintain a healthy weight.For adults 20 years and older:  A BMI below 18.5 is considered underweight.  A BMI of 18.5 to 24.9 is normal.  A BMI of 25 to 29.9 is considered overweight.  A BMI of  30 and above is considered obese.  Maintain normal blood lipids and cholesterol levels by exercising and minimizing your intake of saturated fat. Eat a balanced diet with plenty of fruit and vegetables. Blood tests for lipids and cholesterol should begin at age 76 and be repeated every 5 years. If your lipid or cholesterol levels are high, you are over 50, or you are at high risk for heart disease, you may need your cholesterol levels checked more frequently.Ongoing high lipid and cholesterol levels should be treated with medicines if diet and exercise are not working.  If you smoke, find out from your health care provider how to quit. If you do not use tobacco, do not start.  Lung cancer screening is recommended for adults aged 22-80 years who are at high risk for developing lung cancer because of a history of smoking. A yearly low-dose CT scan of the lungs is recommended for people who have at least a 30-pack-year history of smoking and are a current smoker or have quit within the past 15 years. A pack year of smoking is smoking an average of 1 pack of cigarettes a day for 1 year (for example: 1 pack a day for 30 years or 2 packs a day for 15 years). Yearly screening should continue until the smoker has stopped smoking for at least 15 years. Yearly screening should be stopped for people who develop a health problem that would prevent them from having lung cancer treatment.  If you are pregnant, do not drink alcohol. If you are breastfeeding,  be very cautious about drinking alcohol. If you are not pregnant and choose to drink alcohol, do not have more than 1 drink per day. One drink is considered to be 12 ounces (355 mL) of beer, 5 ounces (148 mL) of wine, or 1.5 ounces (44 mL) of liquor.  Avoid use of street drugs. Do not share needles with anyone. Ask for help if you need support or instructions about stopping the use of drugs.  High blood pressure causes heart disease and increases the risk of  stroke. Your blood pressure should be checked at least every 1 to 2 years. Ongoing high blood pressure should be treated with medicines if weight loss and exercise do not work.  If you are 75-52 years old, ask your health care provider if you should take aspirin to prevent strokes.  Diabetes screening involves taking a blood sample to check your fasting blood sugar level. This should be done once every 3 years, after age 15, if you are within normal weight and without risk factors for diabetes. Testing should be considered at a younger age or be carried out more frequently if you are overweight and have at least 1 risk factor for diabetes.  Breast cancer screening is essential preventive care for women. You should practice "breast self-awareness." This means understanding the normal appearance and feel of your breasts and may include breast self-examination. Any changes detected, no matter how small, should be reported to a health care provider. Women in their 58s and 30s should have a clinical breast exam (CBE) by a health care provider as part of a regular health exam every 1 to 3 years. After age 16, women should have a CBE every year. Starting at age 53, women should consider having a mammogram (breast X-ray test) every year. Women who have a family history of breast cancer should talk to their health care provider about genetic screening. Women at a high risk of breast cancer should talk to their health care providers about having an MRI and a mammogram every year.  Breast cancer gene (BRCA)-related cancer risk assessment is recommended for women who have family members with BRCA-related cancers. BRCA-related cancers include breast, ovarian, tubal, and peritoneal cancers. Having family members with these cancers may be associated with an increased risk for harmful changes (mutations) in the breast cancer genes BRCA1 and BRCA2. Results of the assessment will determine the need for genetic counseling and  BRCA1 and BRCA2 testing.  Routine pelvic exams to screen for cancer are no longer recommended for nonpregnant women who are considered low risk for cancer of the pelvic organs (ovaries, uterus, and vagina) and who do not have symptoms. Ask your health care provider if a screening pelvic exam is right for you.  If you have had past treatment for cervical cancer or a condition that could lead to cancer, you need Pap tests and screening for cancer for at least 20 years after your treatment. If Pap tests have been discontinued, your risk factors (such as having a new sexual partner) need to be reassessed to determine if screening should be resumed. Some women have medical problems that increase the chance of getting cervical cancer. In these cases, your health care provider may recommend more frequent screening and Pap tests.  The HPV test is an additional test that may be used for cervical cancer screening. The HPV test looks for the virus that can cause the cell changes on the cervix. The cells collected during the Pap test can be  tested for HPV. The HPV test could be used to screen women aged 30 years and older, and should be used in women of any age who have unclear Pap test results. After the age of 30, women should have HPV testing at the same frequency as a Pap test.  Colorectal cancer can be detected and often prevented. Most routine colorectal cancer screening begins at the age of 50 years and continues through age 75 years. However, your health care provider may recommend screening at an earlier age if you have risk factors for colon cancer. On a yearly basis, your health care provider may provide home test kits to check for hidden blood in the stool. Use of a small camera at the end of a tube, to directly examine the colon (sigmoidoscopy or colonoscopy), can detect the earliest forms of colorectal cancer. Talk to your health care provider about this at age 50, when routine screening begins. Direct  exam of the colon should be repeated every 5-10 years through age 75 years, unless early forms of pre-cancerous polyps or small growths are found.  People who are at an increased risk for hepatitis B should be screened for this virus. You are considered at high risk for hepatitis B if:  You were born in a country where hepatitis B occurs often. Talk with your health care provider about which countries are considered high risk.  Your parents were born in a high-risk country and you have not received a shot to protect against hepatitis B (hepatitis B vaccine).  You have HIV or AIDS.  You use needles to inject street drugs.  You live with, or have sex with, someone who has hepatitis B.  You get hemodialysis treatment.  You take certain medicines for conditions like cancer, organ transplantation, and autoimmune conditions.  Hepatitis C blood testing is recommended for all people born from 1945 through 1965 and any individual with known risks for hepatitis C.  Practice safe sex. Use condoms and avoid high-risk sexual practices to reduce the spread of sexually transmitted infections (STIs). STIs include gonorrhea, chlamydia, syphilis, trichomonas, herpes, HPV, and human immunodeficiency virus (HIV). Herpes, HIV, and HPV are viral illnesses that have no cure. They can result in disability, cancer, and death.  You should be screened for sexually transmitted illnesses (STIs) including gonorrhea and chlamydia if:  You are sexually active and are younger than 24 years.  You are older than 24 years and your health care provider tells you that you are at risk for this type of infection.  Your sexual activity has changed since you were last screened and you are at an increased risk for chlamydia or gonorrhea. Ask your health care provider if you are at risk.  If you are at risk of being infected with HIV, it is recommended that you take a prescription medicine daily to prevent HIV infection. This is  called preexposure prophylaxis (PrEP). You are considered at risk if:  You are a heterosexual woman, are sexually active, and are at increased risk for HIV infection.  You take drugs by injection.  You are sexually active with a partner who has HIV.  Talk with your health care provider about whether you are at high risk of being infected with HIV. If you choose to begin PrEP, you should first be tested for HIV. You should then be tested every 3 months for as long as you are taking PrEP.  Osteoporosis is a disease in which the bones lose minerals and strength   with aging. This can result in serious bone fractures or breaks. The risk of osteoporosis can be identified using a bone density scan. Women ages 65 years and over and women at risk for fractures or osteoporosis should discuss screening with their health care providers. Ask your health care provider whether you should take a calcium supplement or vitamin D to reduce the rate of osteoporosis.  Menopause can be associated with physical symptoms and risks. Hormone replacement therapy is available to decrease symptoms and risks. You should talk to your health care provider about whether hormone replacement therapy is right for you.  Use sunscreen. Apply sunscreen liberally and repeatedly throughout the day. You should seek shade when your shadow is shorter than you. Protect yourself by wearing long sleeves, pants, a wide-brimmed hat, and sunglasses year round, whenever you are outdoors.  Once a month, do a whole body skin exam, using a mirror to look at the skin on your back. Tell your health care provider of new moles, moles that have irregular borders, moles that are larger than a pencil eraser, or moles that have changed in shape or color.  Stay current with required vaccines (immunizations).  Influenza vaccine. All adults should be immunized every year.  Tetanus, diphtheria, and acellular pertussis (Td, Tdap) vaccine. Pregnant women should  receive 1 dose of Tdap vaccine during each pregnancy. The dose should be obtained regardless of the length of time since the last dose. Immunization is preferred during the 27th-36th week of gestation. An adult who has not previously received Tdap or who does not know her vaccine status should receive 1 dose of Tdap. This initial dose should be followed by tetanus and diphtheria toxoids (Td) booster doses every 10 years. Adults with an unknown or incomplete history of completing a 3-dose immunization series with Td-containing vaccines should begin or complete a primary immunization series including a Tdap dose. Adults should receive a Td booster every 10 years.  Varicella vaccine. An adult without evidence of immunity to varicella should receive 2 doses or a second dose if she has previously received 1 dose. Pregnant females who do not have evidence of immunity should receive the first dose after pregnancy. This first dose should be obtained before leaving the health care facility. The second dose should be obtained 4-8 weeks after the first dose.  Human papillomavirus (HPV) vaccine. Females aged 13-26 years who have not received the vaccine previously should obtain the 3-dose series. The vaccine is not recommended for use in pregnant females. However, pregnancy testing is not needed before receiving a dose. If a female is found to be pregnant after receiving a dose, no treatment is needed. In that case, the remaining doses should be delayed until after the pregnancy. Immunization is recommended for any person with an immunocompromised condition through the age of 26 years if she did not get any or all doses earlier. During the 3-dose series, the second dose should be obtained 4-8 weeks after the first dose. The third dose should be obtained 24 weeks after the first dose and 16 weeks after the second dose.  Zoster vaccine. One dose is recommended for adults aged 60 years or older unless certain conditions are  present.  Measles, mumps, and rubella (MMR) vaccine. Adults born before 1957 generally are considered immune to measles and mumps. Adults born in 1957 or later should have 1 or more doses of MMR vaccine unless there is a contraindication to the vaccine or there is laboratory evidence of immunity to   each of the three diseases. A routine second dose of MMR vaccine should be obtained at least 28 days after the first dose for students attending postsecondary schools, health care workers, or international travelers. People who received inactivated measles vaccine or an unknown type of measles vaccine during 1963-1967 should receive 2 doses of MMR vaccine. People who received inactivated mumps vaccine or an unknown type of mumps vaccine before 1979 and are at high risk for mumps infection should consider immunization with 2 doses of MMR vaccine. For females of childbearing age, rubella immunity should be determined. If there is no evidence of immunity, females who are not pregnant should be vaccinated. If there is no evidence of immunity, females who are pregnant should delay immunization until after pregnancy. Unvaccinated health care workers born before 1957 who lack laboratory evidence of measles, mumps, or rubella immunity or laboratory confirmation of disease should consider measles and mumps immunization with 2 doses of MMR vaccine or rubella immunization with 1 dose of MMR vaccine.  Pneumococcal 13-valent conjugate (PCV13) vaccine. When indicated, a person who is uncertain of her immunization history and has no record of immunization should receive the PCV13 vaccine. An adult aged 19 years or older who has certain medical conditions and has not been previously immunized should receive 1 dose of PCV13 vaccine. This PCV13 should be followed with a dose of pneumococcal polysaccharide (PPSV23) vaccine. The PPSV23 vaccine dose should be obtained at least 8 weeks after the dose of PCV13 vaccine. An adult aged 19  years or older who has certain medical conditions and previously received 1 or more doses of PPSV23 vaccine should receive 1 dose of PCV13. The PCV13 vaccine dose should be obtained 1 or more years after the last PPSV23 vaccine dose.  Pneumococcal polysaccharide (PPSV23) vaccine. When PCV13 is also indicated, PCV13 should be obtained first. All adults aged 65 years and older should be immunized. An adult younger than age 65 years who has certain medical conditions should be immunized. Any person who resides in a nursing home or long-term care facility should be immunized. An adult smoker should be immunized. People with an immunocompromised condition and certain other conditions should receive both PCV13 and PPSV23 vaccines. People with human immunodeficiency virus (HIV) infection should be immunized as soon as possible after diagnosis. Immunization during chemotherapy or radiation therapy should be avoided. Routine use of PPSV23 vaccine is not recommended for American Indians, [place] Natives, or people younger than 65 years unless there are medical conditions that require PPSV23 vaccine. When indicated, people who have unknown immunization and have no record of immunization should receive PPSV23 vaccine. One-time revaccination 5 years after the first dose of PPSV23 is recommended for people aged 19-64 years who have chronic kidney failure, nephrotic syndrome, asplenia, or immunocompromised conditions. People who received 1-2 doses of PPSV23 before age 65 years should receive another dose of PPSV23 vaccine at age 65 years or later if at least 5 years have passed since the previous dose. Doses of PPSV23 are not needed for people immunized with PPSV23 at or after age 65 years.  Meningococcal vaccine. Adults with asplenia or persistent complement component deficiencies should receive 2 doses of quadrivalent meningococcal conjugate (MenACWY-D) vaccine. The doses should be obtained at least 2 months apart.  Microbiologists working with certain meningococcal bacteria, military recruits, people at risk during an outbreak, and people who travel to or live in countries with a high rate of meningitis should be immunized. A first-year college student up through age   21 years who is living in a residence hall should receive a dose if she did not receive a dose on or after her 16th birthday. Adults who have certain high-risk conditions should receive one or more doses of vaccine.  Hepatitis A vaccine. Adults who wish to be protected from this disease, have certain high-risk conditions, work with hepatitis A-infected animals, work in hepatitis A research labs, or travel to or work in countries with a high rate of hepatitis A should be immunized. Adults who were previously unvaccinated and who anticipate close contact with an international adoptee during the first 60 days after arrival in the [place] from a country with a high rate of hepatitis A should be immunized.  Hepatitis B vaccine. Adults who wish to be protected from this disease, have certain high-risk conditions, may be exposed to blood or other infectious body fluids, are household contacts or sex partners of hepatitis B positive people, are clients or workers in certain care facilities, or travel to or work in countries with a high rate of hepatitis B should be immunized.  Haemophilus influenzae type b (Hib) vaccine. A previously unvaccinated person with asplenia or sickle cell disease or having a scheduled splenectomy should receive 1 dose of Hib vaccine. Regardless of previous immunization, a recipient of a hematopoietic stem cell transplant should receive a 3-dose series 6-12 months after her successful transplant. Hib vaccine is not recommended for adults with HIV infection. Preventive Services / Frequency Ages 64 to 68 years  Blood pressure check.** / Every 1 to 2 years.  Lipid and cholesterol check.** / Every 5 years beginning at age  22.  Clinical breast exam.** / Every 3 years for women in their 88s and 53s.  BRCA-related cancer risk assessment.** / For women who have family members with a BRCA-related cancer (breast, ovarian, tubal, or peritoneal cancers).  Pap test.** / Every 2 years from ages 90 through 51. Every 3 years starting at age 21 through age 56 or 3 with a history of 3 consecutive normal Pap tests.  HPV screening.** / Every 3 years from ages 24 through ages 1 to 46 with a history of 3 consecutive normal Pap tests.  Hepatitis C blood test.** / For any individual with known risks for hepatitis C.  Skin self-exam. / Monthly.  Influenza vaccine. / Every year.  Tetanus, diphtheria, and acellular pertussis (Tdap, Td) vaccine.** / Consult your health care provider. Pregnant women should receive 1 dose of Tdap vaccine during each pregnancy. 1 dose of Td every 10 years.  Varicella vaccine.** / Consult your health care provider. Pregnant females who do not have evidence of immunity should receive the first dose after pregnancy.  HPV vaccine. / 3 doses over 6 months, if 72 and younger. The vaccine is not recommended for use in pregnant females. However, pregnancy testing is not needed before receiving a dose.  Measles, mumps, rubella (MMR) vaccine.** / You need at least 1 dose of MMR if you were born in 1957 or later. You may also need a 2nd dose. For females of childbearing age, rubella immunity should be determined. If there is no evidence of immunity, females who are not pregnant should be vaccinated. If there is no evidence of immunity, females who are pregnant should delay immunization until after pregnancy.  Pneumococcal 13-valent conjugate (PCV13) vaccine.** / Consult your health care provider.  Pneumococcal polysaccharide (PPSV23) vaccine.** / 1 to 2 doses if you smoke cigarettes or if you have certain conditions.  Meningococcal vaccine.** /  1 dose if you are age 19 to 21 years and a first-year college  student living in a residence hall, or have one of several medical conditions, you need to get vaccinated against meningococcal disease. You may also need additional booster doses.  Hepatitis A vaccine.** / Consult your health care provider.  Hepatitis B vaccine.** / Consult your health care provider.  Haemophilus influenzae type b (Hib) vaccine.** / Consult your health care provider. Ages 40 to 64 years  Blood pressure check.** / Every 1 to 2 years.  Lipid and cholesterol check.** / Every 5 years beginning at age 20 years.  Lung cancer screening. / Every year if you are aged 55-80 years and have a 30-pack-year history of smoking and currently smoke or have quit within the past 15 years. Yearly screening is stopped once you have quit smoking for at least 15 years or develop a health problem that would prevent you from having lung cancer treatment.  Clinical breast exam.** / Every year after age 40 years.  BRCA-related cancer risk assessment.** / For women who have family members with a BRCA-related cancer (breast, ovarian, tubal, or peritoneal cancers).  Mammogram.** / Every year beginning at age 40 years and continuing for as long as you are in good health. Consult with your health care provider.  Pap test.** / Every 3 years starting at age 30 years through age 65 or 70 years with a history of 3 consecutive normal Pap tests.  HPV screening.** / Every 3 years from ages 30 years through ages 65 to 70 years with a history of 3 consecutive normal Pap tests.  Fecal occult blood test (FOBT) of stool. / Every year beginning at age 50 years and continuing until age 75 years. You may not need to do this test if you get a colonoscopy every 10 years.  Flexible sigmoidoscopy or colonoscopy.** / Every 5 years for a flexible sigmoidoscopy or every 10 years for a colonoscopy beginning at age 50 years and continuing until age 75 years.  Hepatitis C blood test.** / For all people born from 1945 through  1965 and any individual with known risks for hepatitis C.  Skin self-exam. / Monthly.  Influenza vaccine. / Every year.  Tetanus, diphtheria, and acellular pertussis (Tdap/Td) vaccine.** / Consult your health care provider. Pregnant women should receive 1 dose of Tdap vaccine during each pregnancy. 1 dose of Td every 10 years.  Varicella vaccine.** / Consult your health care provider. Pregnant females who do not have evidence of immunity should receive the first dose after pregnancy.  Zoster vaccine.** / 1 dose for adults aged 60 years or older.  Measles, mumps, rubella (MMR) vaccine.** / You need at least 1 dose of MMR if you were born in 1957 or later. You may also need a 2nd dose. For females of childbearing age, rubella immunity should be determined. If there is no evidence of immunity, females who are not pregnant should be vaccinated. If there is no evidence of immunity, females who are pregnant should delay immunization until after pregnancy.  Pneumococcal 13-valent conjugate (PCV13) vaccine.** / Consult your health care provider.  Pneumococcal polysaccharide (PPSV23) vaccine.** / 1 to 2 doses if you smoke cigarettes or if you have certain conditions.  Meningococcal vaccine.** / Consult your health care provider.  Hepatitis A vaccine.** / Consult your health care provider.  Hepatitis B vaccine.** / Consult your health care provider.  Haemophilus influenzae type b (Hib) vaccine.** / Consult your health care provider. Ages 65   years and over  Blood pressure check.** / Every 1 to 2 years.  Lipid and cholesterol check.** / Every 5 years beginning at age 22 years.  Lung cancer screening. / Every year if you are aged 73-80 years and have a 30-pack-year history of smoking and currently smoke or have quit within the past 15 years. Yearly screening is stopped once you have quit smoking for at least 15 years or develop a health problem that would prevent you from having lung cancer  treatment.  Clinical breast exam.** / Every year after age 4 years.  BRCA-related cancer risk assessment.** / For women who have family members with a BRCA-related cancer (breast, ovarian, tubal, or peritoneal cancers).  Mammogram.** / Every year beginning at age 40 years and continuing for as long as you are in good health. Consult with your health care provider.  Pap test.** / Every 3 years starting at age 9 years through age 34 or 91 years with 3 consecutive normal Pap tests. Testing can be stopped between 65 and 70 years with 3 consecutive normal Pap tests and no abnormal Pap or HPV tests in the past 10 years.  HPV screening.** / Every 3 years from ages 57 years through ages 64 or 45 years with a history of 3 consecutive normal Pap tests. Testing can be stopped between 65 and 70 years with 3 consecutive normal Pap tests and no abnormal Pap or HPV tests in the past 10 years.  Fecal occult blood test (FOBT) of stool. / Every year beginning at age 15 years and continuing until age 17 years. You may not need to do this test if you get a colonoscopy every 10 years.  Flexible sigmoidoscopy or colonoscopy.** / Every 5 years for a flexible sigmoidoscopy or every 10 years for a colonoscopy beginning at age 86 years and continuing until age 71 years.  Hepatitis C blood test.** / For all people born from [birth year] through 1967 and any individual with known risks for hepatitis C.  Osteoporosis screening.** / A one-time screening for women ages 83 years and over and women at risk for fractures or osteoporosis.  Skin self-exam. / Monthly.  Influenza vaccine. / Every year.  Tetanus, diphtheria, and acellular pertussis (Tdap/Td) vaccine.** / 1 dose of Td every 10 years.  Varicella vaccine.** / Consult your health care provider.  Zoster vaccine.** / 1 dose for adults aged 61 years or older.  Pneumococcal 13-valent conjugate (PCV13) vaccine.** / Consult your health care provider.  Pneumococcal  polysaccharide (PPSV23) vaccine.** / 1 dose for all adults aged 28 years and older.  Meningococcal vaccine.** / Consult your health care provider.  Hepatitis A vaccine.** / Consult your health care provider.  Hepatitis B vaccine.** / Consult your health care provider.  Haemophilus influenzae type b (Hib) vaccine.** / Consult your health care provider. ** Family history and personal history of risk and conditions may change your health care provider's recommendations. Document Released: 02/05/2002 Document Revised: 04/26/2014 Document Reviewed: 05/07/2011 [facility] Patient Information 2015 [place], [place]. This information is not intended to replace advice given to you by your health care provider. Make sure you discuss any questions you have with your health care provider.

## 2015-06-13 NOTE — Assessment & Plan Note (Signed)
encouraged heart healthy diet, avoid trans fats, minimize simple carbs and saturated fats. Increase exercise as tolerated. Continue Flaxseed oil.

## 2015-06-13 NOTE — Progress Notes (Signed)
Pre visit review using our clinic review tool, if applicable. No additional management support is needed unless otherwise documented below in the visit note. 

## 2015-06-13 NOTE — Assessment & Plan Note (Signed)
Patient encouraged to maintain heart healthy diet, regular exercise, adequate sleep. Consider daily probiotics. Take medications as prescribed. Given and reviewed copy of ACP documents from [place] Secretary of State and encouraged to complete and return 

## 2015-06-13 NOTE — Assessment & Plan Note (Signed)
Increase leafy greens, consider increased lean red meat and using cast iron cookware. Continue to monitor, report any concerns 

## 2015-06-30 ENCOUNTER — Other Ambulatory Visit: Payer: Self-pay

## 2015-06-30 DIAGNOSIS — Z1231 Encounter for screening mammogram for malignant neoplasm of breast: Secondary | ICD-10-CM

## 2015-07-29 ENCOUNTER — Other Ambulatory Visit: Payer: Self-pay | Admitting: Family Medicine

## 2015-08-02 ENCOUNTER — Ambulatory Visit
Admission: RE | Admit: 2015-08-02 | Discharge: 2015-08-02 | Disposition: A | Discharge status: Home / Self Care | Payer: 59 | Source: Ambulatory Visit

## 2015-08-02 DIAGNOSIS — Z1231 Encounter for screening mammogram for malignant neoplasm of breast: Secondary | ICD-10-CM

## 2015-08-08 ENCOUNTER — Ambulatory Visit: Payer: 59 | Admitting: Gynecology

## 2015-08-15 ENCOUNTER — Other Ambulatory Visit: Payer: Self-pay | Admitting: Family Medicine

## 2015-08-15 MED ORDER — FLUTICASONE PROPIONATE 50 MCG/ACT NA SUSP: NASAL | Status: DC

## 2015-10-28 ENCOUNTER — Other Ambulatory Visit: Payer: Self-pay | Admitting: Family Medicine

## 2015-10-28 ENCOUNTER — Encounter: Payer: Self-pay | Admitting: Family Medicine

## 2015-10-28 MED ORDER — ALPRAZOLAM 0.25 MG PO TABS: 0.2500 mg | ORAL_TABLET | Freq: Two times a day (BID) | ORAL | Status: DC | PRN

## 2015-10-28 NOTE — Telephone Encounter (Signed)
The patient was last seen on 06/13/15, Xanax not on her medication list and may not have been filled here.  Please advise.      KP

## 2015-10-28 NOTE — Telephone Encounter (Signed)
Printed and faxed alprazolam to [facility] in [place] per [place] request.

## 2015-12-22 ENCOUNTER — Ambulatory Visit: Payer: 59 | Admitting: Family Medicine

## 2016-01-11 ENCOUNTER — Ambulatory Visit: Payer: 59 | Admitting: Family Medicine

## 2016-02-20 ENCOUNTER — Encounter: Payer: Self-pay | Admitting: Family Medicine

## 2016-02-20 ENCOUNTER — Ambulatory Visit (INDEPENDENT_AMBULATORY_CARE_PROVIDER_SITE_OTHER): Payer: 59 | Admitting: Family Medicine

## 2016-02-20 ENCOUNTER — Other Ambulatory Visit (HOSPITAL_COMMUNITY)
Admission: RE | Admit: 2016-02-20 | Discharge: 2016-02-20 | Disposition: A | Discharge status: Home / Self Care | Payer: 59 | Source: Ambulatory Visit | Attending: Family Medicine | Admitting: Family Medicine

## 2016-02-20 VITALS — BP 112/78 | HR 71 | Temp 98.2°F | Ht 61.0 in | Wt 151.2 lb

## 2016-02-20 DIAGNOSIS — N760 Acute vaginitis: Secondary | ICD-10-CM | POA: Insufficient documentation

## 2016-02-20 DIAGNOSIS — E782 Mixed hyperlipidemia: Secondary | ICD-10-CM | POA: Diagnosis not present

## 2016-02-20 DIAGNOSIS — F419 Anxiety disorder, unspecified: Secondary | ICD-10-CM

## 2016-02-20 DIAGNOSIS — Z124 Encounter for screening for malignant neoplasm of cervix: Secondary | ICD-10-CM

## 2016-02-20 DIAGNOSIS — Z0000 Encounter for general adult medical examination without abnormal findings: Secondary | ICD-10-CM

## 2016-02-20 DIAGNOSIS — K580 Irritable bowel syndrome with diarrhea: Secondary | ICD-10-CM | POA: Diagnosis not present

## 2016-02-20 DIAGNOSIS — Z01411 Encounter for gynecological examination (general) (routine) with abnormal findings: Secondary | ICD-10-CM | POA: Diagnosis not present

## 2016-02-20 DIAGNOSIS — F32A Depression, unspecified: Secondary | ICD-10-CM

## 2016-02-20 DIAGNOSIS — F418 Other specified anxiety disorders: Secondary | ICD-10-CM | POA: Diagnosis not present

## 2016-02-20 DIAGNOSIS — F329 Major depressive disorder, single episode, unspecified: Secondary | ICD-10-CM

## 2016-02-20 HISTORY — DX: Encounter for screening for malignant neoplasm of cervix: Z12.4

## 2016-02-20 MED ORDER — PAROXETINE HCL 20 MG PO TABS: 30.0000 mg | ORAL_TABLET | Freq: Every day | ORAL | Status: DC

## 2016-02-20 NOTE — Patient Instructions (Signed)
NOW probioitic daily  Irritable Bowel Syndrome, Adult Irritable bowel syndrome (IBS) is not one specific disease. It is a group of symptoms that affects the organs responsible for digestion (gastrointestinal or GI tract).  To regulate how your GI tract works, your body sends signals back and forth between your intestines and your brain. If you have IBS, there may be a problem with these signals. As a result, your GI tract does not function normally. Your intestines may become more sensitive and overreact to certain things. This is especially true when you eat certain foods or when you are under stress.  There are four types of IBS. These may be determined based on the consistency of your stool:   IBS with diarrhea.   IBS with constipation.   Mixed IBS.   Unsubtyped IBS.  It is important to know which type of IBS you have. Some treatments are more likely to be helpful for certain types of IBS.  CAUSES  The exact cause of IBS is not known. RISK FACTORS You may have a higher risk of IBS if:  You are a woman.  You are younger than 47 years old.  You have a family history of IBS.  You have mental health problems.  You have had bacterial infection of your GI tract. SIGNS AND SYMPTOMS  Symptoms of IBS vary from person to person. The main symptom is abdominal pain or discomfort. Additional symptoms usually include one or more of the following:   Diarrhea, constipation, or both.   Abdominal swelling or bloating.   Feeling full or sick after eating a small or regular-size meal.   Frequent gas.   Mucus in the stool.   A feeling of having more stool left after a bowel movement.  Symptoms tend to come and go. They may be associated with stress, psychiatric conditions, or nothing at all.  DIAGNOSIS  There is no specific test to diagnose IBS. Your health care provider will make a diagnosis based on a physical exam, medical history, and your symptoms. You may have other tests  to rule out other conditions that may be causing your symptoms. These may include:   Blood tests.   X-rays.   CT scan.  Endoscopy and colonoscopy. This is a test in which your GI tract is viewed with a long, thin, flexible tube. TREATMENT There is no cure for IBS, but treatment can help relieve symptoms. IBS treatment often includes:   Changes to your diet, such as:  Eating more fiber.  Avoiding foods that cause symptoms.  Drinking more water.  Eating regular, medium-sized portioned meals.  Medicines. These may include:  Fiber supplements if you have constipation.  Medicine to control diarrhea (antidiarrheal medicines).  Medicine to help control muscle spasms in your GI tract (antispasmodic medicines).  Medicines to help with any mental health issues, such as antidepressants or tranquilizers.  Therapy.  Talk therapy may help with anxiety, depression, or other mental health issues that can make IBS symptoms worse.  Stress reduction.  Managing your stress can help keep symptoms under control. HOME CARE INSTRUCTIONS   Take medicines only as directed by your health care provider.  Eat a healthy diet.  Avoid foods and drinks with added sugar.  Include more whole grains, fruits, and vegetables gradually into your diet. This may be especially helpful if you have IBS with constipation.  Avoid any foods and drinks that make your symptoms worse. These may include dairy products and caffeinated or carbonated drinks.  Do  not eat large meals.  Drink enough fluid to keep your urine clear or pale yellow.  Exercise regularly. Ask your health care provider for recommendations of good activities for you.  Keep all follow-up visits as directed by your health care provider. This is important. SEEK MEDICAL CARE IF:   You have constant pain.  You have trouble or pain with swallowing.  You have worsening diarrhea. SEEK IMMEDIATE MEDICAL CARE IF:   You have severe and  worsening abdominal pain.   You have diarrhea and:   You have a rash, stiff neck, or severe headache.   You are irritable, sleepy, or difficult to awaken.   You are weak, dizzy, or extremely thirsty.   You have bright red blood in your stool or you have black tarry stools.   You have unusual abdominal swelling that is painful.   You vomit continuously.   You vomit blood (hematemesis).   You have both abdominal pain and a fever.    This information is not intended to replace advice given to you by your health care provider. Make sure you discuss any questions you have with your health care provider.   Document Released: 12/10/2005 Document Revised: 12/31/2014 Document Reviewed: 08/27/2014 Elsevier Interactive Patient Education [employer].

## 2016-02-20 NOTE — Assessment & Plan Note (Signed)
Pap today, no concerns on exam.  

## 2016-02-20 NOTE — Assessment & Plan Note (Signed)
Encouraged heart healthy diet, increase exercise, avoid trans fats, consider a krill oil cap daily. Declines labs today

## 2016-02-20 NOTE — Assessment & Plan Note (Signed)
Struggling with a divorce new job and has just moved. Will increase Paxil 30 mg po daily and reassess

## 2016-02-20 NOTE — Progress Notes (Signed)
Pre visit review using our clinic review tool, if applicable. No additional management support is needed unless otherwise documented below in the visit note. 

## 2016-02-20 NOTE — Assessment & Plan Note (Signed)
Avoid offending foods, start probiotics. Do not eat large meals in late evening and consider raising head of bed. Consider NOW company probiotic. Increase the Paxil to 30 mg daily

## 2016-02-20 NOTE — Progress Notes (Signed)
Patient ID: Tricia Potts, female   DOB: November 09, 1971, 45 y.o.   MRN: [id number]   Subjective:    Patient ID: Tricia Potts, female    DOB: 10/19/1971, 45 y.o.   MRN: [id number]  Chief Complaint  Patient presents with  . Follow-up    pap smear    HPI Patient is in today for follow up and GYN exam. Is under a great deal of stress. Is undergoing a divorce, training numerous nurses at work and managing her kids reactions to the divorce. As a result her IBS has flared and she has frequent loose stool each day. No bloody or tarry stool. Denies CP/palp/SOB/HA/congestion/fevers/GI or GU c/o. Taking meds as prescribed  Past Medical History  Diagnosis Date  . Allergy   . IBS (irritable bowel syndrome)   . Hemorrhoid   . Anxiety   . Hyperlipemia   . History of hidradenitis suppurativa   . Bronchitis, mucopurulent recurrent ([place]) 04/21/2013  . PCO (polycystic ovaries)   . Asthma   . Broken ankle 2011    (left) roller skating  . Anemia 06/18/2014  . Cervical cancer screening 02/20/2016  . Anxiety and depression 02/17/2009    Qualifier: Diagnosis of  By: Redmond Pulling MD, Frann Rider      Past Surgical History  Procedure Laterality Date  . Hemorrhoid surgery    . Ankle surgery      plate and 8 screws in left ankle    Family History  Problem Relation Age of Onset  . Ovarian cancer      Grandmother  . Arthritis    . Allergies Mother   . COPD Mother   . Heart disease Father     mitral valve disease/rupture during physical stress  . GER disease Father   . Irritable bowel syndrome Father   . Heart failure Father   . Hepatitis C Brother   . Cancer Brother 17    ALL  . Heart disease Daughter     asd s/p repair at age 86  . Anxiety disorder Daughter   . Cancer Maternal Grandmother     ovarian cancer  . Heart disease Maternal Grandfather     MI at 6  . Kidney disease Paternal Grandfather     possible kidney cancer  . Allergies Sister   . Eczema Sister   . Cancer Sister 60    breast,  DCIS  . Allergies Daughter     Social History   Social History  . Marital Status: Married    Spouse Name: N/A  . Number of Children: N/A  . Years of Education: N/A   Occupational History  . Not on file.   Social History Main Topics  . Smoking status: Never Smoker   . Smokeless tobacco: Never Used  . Alcohol Use: No     Comment: vegetarian  . Drug Use: No  . Sexual Activity:    Partners: Male    Birth Control/ Protection: Other-see comments     Comment: vasectomy   Other Topics Concern  . Not on file   Social History Narrative    Outpatient Prescriptions Prior to Visit  Medication Sig Dispense Refill  . ALPRAZolam (XANAX) 0.25 MG tablet Take 1 tablet (0.25 mg total) by mouth 2 (two) times daily as needed for anxiety. 40 tablet 1  . cetirizine (ZYRTEC) 10 MG tablet Take 10 mg by mouth as needed.     . Flaxseed, Linseed, (FLAXSEED OIL PO) Take by mouth.    Marland Kitchen  fluticasone (FLONASE) 50 MCG/ACT nasal spray PLACE 2 SPRAYS INTO THE NOSE DAILY 16 g 8  . Methylcobalamin 1 MG CHEW Chew by mouth once a week.    . Multiple Vitamin (MULTI-VITAMIN DAILY PO) Take by mouth daily.    Marland Kitchen OVER THE COUNTER MEDICATION OTC eye drops    . Probiotic Product (PRO-BIOTIC BLEND PO) Take by mouth daily.    Marland Kitchen PARoxetine (PAXIL) 20 MG tablet Take 1 tablet (20 mg total) by mouth daily. 30 tablet 5  . PARoxetine (PAXIL) 20 MG tablet TAKE 1 TABLET BY MOUTH DAILY 30 tablet 0   No facility-administered medications prior to visit.    No Known Allergies  Review of Systems  Constitutional: Negative for fever and malaise/fatigue.  HENT: Negative for congestion.   Eyes: Negative for discharge.  Respiratory: Negative for shortness of breath.   Cardiovascular: Negative for chest pain, palpitations and leg swelling.  Gastrointestinal: Positive for diarrhea. Negative for nausea, abdominal pain, blood in stool and melena.  Genitourinary: Negative for dysuria.  Musculoskeletal: Negative for falls.  Skin:  Negative for rash.  Neurological: Negative for loss of consciousness and headaches.  Endo/Heme/Allergies: Negative for environmental allergies.  Psychiatric/Behavioral: Negative for depression. The patient is nervous/anxious.        Objective:    Physical Exam  Constitutional: She is oriented to person, place, and time. She appears well-developed and well-nourished. No distress.  HENT:  Head: Normocephalic and atraumatic.  Nose: Nose normal.  Eyes: Right eye exhibits no discharge. Left eye exhibits no discharge.  Neck: Normal range of motion. Neck supple.  Cardiovascular: Normal rate and regular rhythm.   No murmur heard. Pulmonary/Chest: Effort normal and breath sounds normal.  Abdominal: Soft. Bowel sounds are normal. There is no tenderness.  Genitourinary: Uterus normal. Vaginal discharge found.  Breast exam no masses, dishcharge or lesions b/l  Musculoskeletal: She exhibits no edema.  Neurological: She is alert and oriented to person, place, and time.  Skin: Skin is warm and dry.  Psychiatric: She has a normal mood and affect.  Nursing note and vitals reviewed.   BP 112/78 mmHg  Pulse 71  Temp(Src) 98.2 F (36.8 C) (Oral)  Ht 5\' 1"  (1.549 m)  Wt 151 lb 4 oz (68.607 kg)  BMI 28.59 kg/m2  SpO2 97% Wt Readings from Last 3 Encounters:  02/20/16 151 lb 4 oz (68.607 kg)  06/13/15 152 lb 2 oz (69.003 kg)  01/04/15 148 lb 6.4 oz (67.314 kg)     Lab Results  Component Value Date   WBC 5.5 06/08/2015   HGB 12.4 06/08/2015   HCT 37.0 06/08/2015   PLT 350.0 06/08/2015   GLUCOSE 90 06/08/2015   CHOL 259* 06/08/2015   TRIG 140.0 06/08/2015   HDL 69.40 06/08/2015   LDLCALC 162* 06/08/2015   ALT 17 06/08/2015   AST 17 06/08/2015   NA 136 06/08/2015   K 4.2 06/08/2015   CL 103 06/08/2015   CREATININE 0.71 06/08/2015   BUN 10 06/08/2015   CO2 27 06/08/2015   TSH 2.70 06/08/2015    Lab Results  Component Value Date   TSH 2.70 06/08/2015   Lab Results  Component  Value Date   WBC 5.5 06/08/2015   HGB 12.4 06/08/2015   HCT 37.0 06/08/2015   MCV 86.2 06/08/2015   PLT 350.0 06/08/2015   Lab Results  Component Value Date   NA 136 06/08/2015   K 4.2 06/08/2015   CO2 27 06/08/2015   GLUCOSE 90 06/08/2015  BUN 10 06/08/2015   CREATININE 0.71 06/08/2015   BILITOT 0.3 06/08/2015   ALKPHOS 63 06/08/2015   AST 17 06/08/2015   ALT 17 06/08/2015   PROT 7.0 06/08/2015   ALBUMIN 4.1 06/08/2015   CALCIUM 9.1 06/08/2015   GFR 94.08 06/08/2015   Lab Results  Component Value Date   CHOL 259* 06/08/2015   Lab Results  Component Value Date   HDL 69.40 06/08/2015   Lab Results  Component Value Date   LDLCALC 162* 06/08/2015   Lab Results  Component Value Date   TRIG 140.0 06/08/2015   Lab Results  Component Value Date   CHOLHDL 4 06/08/2015   No results found for: HGBA1C     Assessment & Plan:   Problem List Items Addressed This Visit    Anxiety and depression    Struggling with a divorce new job and has just moved. Will increase Paxil 30 mg po daily and reassess      Cervical cancer screening - Primary    Pap today, no concerns on exam.       Relevant Orders   Cytology - PAP   Hyperlipidemia, mixed    Encouraged heart healthy diet, increase exercise, avoid trans fats, consider a krill oil cap daily. Declines labs today      Relevant Orders   Lipid panel   IRRITABLE BOWEL SYNDROME    Avoid offending foods, start probiotics. Do not eat large meals in late evening and consider raising head of bed. Consider NOW company probiotic. Increase the Paxil to 30 mg daily      Preventative health care   Relevant Orders   TSH   CBC   Comprehensive metabolic panel      I have discontinued Ms. Dorothyann Gibbs PARoxetine. I have also changed her PARoxetine. Additionally, I am having her maintain her cetirizine, OVER THE COUNTER MEDICATION, Multiple Vitamin (MULTI-VITAMIN DAILY PO), Probiotic Product (PRO-BIOTIC BLEND PO), (Flaxseed,  Linseed, (FLAXSEED OIL PO)), Methylcobalamin, fluticasone, and ALPRAZolam.  Meds ordered this encounter  Medications  . PARoxetine (PAXIL) 20 MG tablet    Sig: Take 1.5 tablets (30 mg total) by mouth daily.    Dispense:  45 tablet    Refill:  5     Penni Homans, MD

## 2016-02-21 LAB — CYTOLOGY - PAP

## 2016-02-23 LAB — CERVICOVAGINAL ANCILLARY ONLY
Bacterial vaginitis: NEGATIVE
Candida vaginitis: NEGATIVE

## 2016-05-25 ENCOUNTER — Ambulatory Visit (INDEPENDENT_AMBULATORY_CARE_PROVIDER_SITE_OTHER): Payer: 59 | Admitting: Family Medicine

## 2016-05-25 ENCOUNTER — Encounter: Payer: Self-pay | Admitting: Family Medicine

## 2016-05-25 VITALS — BP 112/86 | HR 80 | Temp 98.9°F | Ht 61.0 in | Wt 156.2 lb

## 2016-05-25 DIAGNOSIS — M545 Low back pain, unspecified: Secondary | ICD-10-CM

## 2016-05-25 DIAGNOSIS — J309 Allergic rhinitis, unspecified: Secondary | ICD-10-CM

## 2016-05-25 DIAGNOSIS — Z0000 Encounter for general adult medical examination without abnormal findings: Secondary | ICD-10-CM | POA: Diagnosis not present

## 2016-05-25 DIAGNOSIS — E782 Mixed hyperlipidemia: Secondary | ICD-10-CM

## 2016-05-25 DIAGNOSIS — F418 Other specified anxiety disorders: Secondary | ICD-10-CM | POA: Diagnosis not present

## 2016-05-25 DIAGNOSIS — F329 Major depressive disorder, single episode, unspecified: Secondary | ICD-10-CM

## 2016-05-25 DIAGNOSIS — F419 Anxiety disorder, unspecified: Principal | ICD-10-CM

## 2016-05-25 HISTORY — DX: Low back pain, unspecified: M54.50

## 2016-05-25 MED ORDER — FLUTICASONE PROPIONATE 50 MCG/ACT NA SUSP: NASAL | Status: DC

## 2016-05-25 MED ORDER — PAROXETINE HCL 20 MG PO TABS: 30.0000 mg | ORAL_TABLET | Freq: Every day | ORAL | Status: DC

## 2016-05-25 NOTE — Patient Instructions (Signed)

## 2016-05-25 NOTE — Progress Notes (Signed)
Pre visit review using our clinic review tool, if applicable. No additional management support is needed unless otherwise documented below in the visit note. 

## 2016-05-25 NOTE — Assessment & Plan Note (Signed)
Doing better with the increase of Paxil to 30 mg daily

## 2016-05-27 NOTE — Progress Notes (Signed)
Patient ID: Tricia Potts, female   DOB: March 31, 1971, 45 y.o.   MRN: [id number]   Subjective:    Patient ID: Tricia Potts, female    DOB: 06/01/1971, 45 y.o.   MRN: [id number]  Chief Complaint  Patient presents with  . Follow-up    HPI Patient is in today for follow up. She is feeling well. No recent illness. She has been struggling with some congestion and allergic symptoms. Her increase in Paxil has been helpful and her mood is improved. Denies CP/palp/SOB/HA/congestion/fevers/GI or GU c/o. Taking meds as prescribed  Past Medical History  Diagnosis Date  . Allergy   . IBS (irritable bowel syndrome)   . Hemorrhoid   . Anxiety   . Hyperlipemia   . History of hidradenitis suppurativa   . Bronchitis, mucopurulent recurrent ([place]) 04/21/2013  . PCO (polycystic ovaries)   . Asthma   . Broken ankle 2011    (left) roller skating  . Anemia 06/18/2014  . Cervical cancer screening 02/20/2016  . Anxiety and depression 02/17/2009    Qualifier: Diagnosis of  By: Redmond Pulling MD, Frann Rider    . Low back pain 05/25/2016    Past Surgical History  Procedure Laterality Date  . Hemorrhoid surgery    . Ankle surgery      plate and 8 screws in left ankle    Family History  Problem Relation Age of Onset  . Ovarian cancer      Grandmother  . Arthritis    . Allergies Mother   . COPD Mother   . Heart disease Father     mitral valve disease/rupture during physical stress  . GER disease Father   . Irritable bowel syndrome Father   . Heart failure Father   . Hepatitis C Brother   . Cancer Brother 17    ALL  . Heart disease Daughter     asd s/p repair at age 10  . Anxiety disorder Daughter   . Cancer Maternal Grandmother     ovarian cancer  . Heart disease Maternal Grandfather     MI at 87  . Kidney disease Paternal Grandfather     possible kidney cancer  . Allergies Sister   . Eczema Sister   . Cancer Sister 28    breast, DCIS  . Allergies Daughter     Social History   Social  History  . Marital Status: Married    Spouse Name: N/A  . Number of Children: N/A  . Years of Education: N/A   Occupational History  . Not on file.   Social History Main Topics  . Smoking status: Never Smoker   . Smokeless tobacco: Never Used  . Alcohol Use: No     Comment: vegetarian  . Drug Use: No  . Sexual Activity:    Partners: Male    Birth Control/ Protection: Other-see comments     Comment: vasectomy   Other Topics Concern  . Not on file   Social History Narrative    Outpatient Prescriptions Prior to Visit  Medication Sig Dispense Refill  . ALPRAZolam (XANAX) 0.25 MG tablet Take 1 tablet (0.25 mg total) by mouth 2 (two) times daily as needed for anxiety. 40 tablet 1  . cetirizine (ZYRTEC) 10 MG tablet Take 10 mg by mouth as needed.     . Flaxseed, Linseed, (FLAXSEED OIL PO) Take by mouth.    . Methylcobalamin 1 MG CHEW Chew by mouth once a week.    . Multiple Vitamin (MULTI-VITAMIN  DAILY PO) Take by mouth daily.    Marland Kitchen OVER THE COUNTER MEDICATION OTC eye drops    . Probiotic Product (PRO-BIOTIC BLEND PO) Take by mouth daily.    . fluticasone (FLONASE) 50 MCG/ACT nasal spray PLACE 2 SPRAYS INTO THE NOSE DAILY 16 g 8  . PARoxetine (PAXIL) 20 MG tablet Take 1.5 tablets (30 mg total) by mouth daily. 45 tablet 5   No facility-administered medications prior to visit.    No Known Allergies  Review of Systems  Constitutional: Negative for fever and malaise/fatigue.  HENT: Negative for congestion.   Eyes: Negative for blurred vision.  Respiratory: Negative for shortness of breath.   Cardiovascular: Negative for chest pain, palpitations and leg swelling.  Gastrointestinal: Negative for nausea, abdominal pain and blood in stool.  Genitourinary: Negative for dysuria and frequency.  Musculoskeletal: Negative for falls.  Skin: Negative for rash.  Neurological: Negative for dizziness, loss of consciousness and headaches.  Endo/Heme/Allergies: Negative for environmental  allergies.  Psychiatric/Behavioral: Negative for depression. The patient is not nervous/anxious.        Objective:    Physical Exam  Constitutional: She is oriented to person, place, and time. She appears well-developed and well-nourished. No distress.  HENT:  Head: Normocephalic and atraumatic.  Nose: Nose normal.  Eyes: Right eye exhibits no discharge. Left eye exhibits no discharge.  Neck: Normal range of motion. Neck supple.  Cardiovascular: Normal rate and regular rhythm.   No murmur heard. Pulmonary/Chest: Effort normal and breath sounds normal.  Abdominal: Soft. Bowel sounds are normal. There is no tenderness.  Musculoskeletal: She exhibits no edema.  Neurological: She is alert and oriented to person, place, and time.  Skin: Skin is warm and dry.  Psychiatric: She has a normal mood and affect.  Nursing note and vitals reviewed.   BP 112/86 mmHg  Pulse 80  Temp(Src) 98.9 F (37.2 C) (Oral)  Ht 5\' 1"  (1.549 m)  Wt 156 lb 4 oz (70.875 kg)  BMI 29.54 kg/m2  SpO2 97% Wt Readings from Last 3 Encounters:  05/25/16 156 lb 4 oz (70.875 kg)  02/20/16 151 lb 4 oz (68.607 kg)  06/13/15 152 lb 2 oz (69.003 kg)     Lab Results  Component Value Date   WBC 5.5 06/08/2015   HGB 12.4 06/08/2015   HCT 37.0 06/08/2015   PLT 350.0 06/08/2015   GLUCOSE 90 06/08/2015   CHOL 259* 06/08/2015   TRIG 140.0 06/08/2015   HDL 69.40 06/08/2015   LDLCALC 162* 06/08/2015   ALT 17 06/08/2015   AST 17 06/08/2015   NA 136 06/08/2015   K 4.2 06/08/2015   CL 103 06/08/2015   CREATININE 0.71 06/08/2015   BUN 10 06/08/2015   CO2 27 06/08/2015   TSH 2.70 06/08/2015    Lab Results  Component Value Date   TSH 2.70 06/08/2015   Lab Results  Component Value Date   WBC 5.5 06/08/2015   HGB 12.4 06/08/2015   HCT 37.0 06/08/2015   MCV 86.2 06/08/2015   PLT 350.0 06/08/2015   Lab Results  Component Value Date   NA 136 06/08/2015   K 4.2 06/08/2015   CO2 27 06/08/2015   GLUCOSE 90  06/08/2015   BUN 10 06/08/2015   CREATININE 0.71 06/08/2015   BILITOT 0.3 06/08/2015   ALKPHOS 63 06/08/2015   AST 17 06/08/2015   ALT 17 06/08/2015   PROT 7.0 06/08/2015   ALBUMIN 4.1 06/08/2015   CALCIUM 9.1 06/08/2015   GFR  94.08 06/08/2015   Lab Results  Component Value Date   CHOL 259* 06/08/2015   Lab Results  Component Value Date   HDL 69.40 06/08/2015   Lab Results  Component Value Date   LDLCALC 162* 06/08/2015   Lab Results  Component Value Date   TRIG 140.0 06/08/2015   Lab Results  Component Value Date   CHOLHDL 4 06/08/2015   No results found for: HGBA1C     Assessment & Plan:   Problem List Items Addressed This Visit    Preventative health care   Relevant Orders   TSH   CBC   Lipid panel   Comprehensive metabolic panel   Low back pain    Encouraged moist heat and gentle stretching as tolerated. May try NSAIDs and prescription meds as directed and report if symptoms worsen or seek immediate care      Relevant Orders   TSH   CBC   Lipid panel   Comprehensive metabolic panel   Hyperlipidemia, mixed    Encouraged heart healthy diet, increase exercise, avoid trans fats, consider a krill oil cap daily      Relevant Orders   TSH   CBC   Lipid panel   Comprehensive metabolic panel   Anxiety and depression - Primary    Doing better with the increase of Paxil to 30 mg daily      Relevant Orders   TSH   CBC   Lipid panel   Comprehensive metabolic panel   Allergic rhinitis    May use antihistamines bid, flonase and nasal saline daily, consider singulair prescription if no improvement      Relevant Orders   TSH   CBC   Lipid panel   Comprehensive metabolic panel      I am having Ms. Gagliano maintain her cetirizine, OVER THE COUNTER MEDICATION, Multiple Vitamin (MULTI-VITAMIN DAILY PO), Probiotic Product (PRO-BIOTIC BLEND PO), (Flaxseed, Linseed, (FLAXSEED OIL PO)), Methylcobalamin, ALPRAZolam, PARoxetine, and fluticasone.  Meds  ordered this encounter  Medications  . PARoxetine (PAXIL) 20 MG tablet    Sig: Take 1.5 tablets (30 mg total) by mouth daily.    Dispense:  45 tablet    Refill:  5  . fluticasone (FLONASE) 50 MCG/ACT nasal spray    Sig: PLACE 2 SPRAYS INTO THE NOSE DAILY    Dispense:  16 g    Refill:  8     BLYTH, STACEY, MD

## 2016-05-27 NOTE — Assessment & Plan Note (Signed)
Encouraged heart healthy diet, increase exercise, avoid trans fats, consider a krill oil cap daily 

## 2016-05-27 NOTE — Assessment & Plan Note (Signed)
May use antihistamines bid, flonase and nasal saline daily, consider singulair prescription if no improvement

## 2016-05-27 NOTE — Assessment & Plan Note (Signed)
Encouraged moist heat and gentle stretching as tolerated. May try NSAIDs and prescription meds as directed and report if symptoms worsen or seek immediate care 

## 2016-05-27 NOTE — Assessment & Plan Note (Signed)
>>  ASSESSMENT AND PLAN FOR OTHER ALLERGIC RHINITIS WRITTEN ON 05/27/2016 11:41 PM BY Penni Homans A, MD  May use antihistamines bid, flonase and nasal saline daily, consider singulair prescription if no improvement

## 2016-08-14 ENCOUNTER — Other Ambulatory Visit (INDEPENDENT_AMBULATORY_CARE_PROVIDER_SITE_OTHER): Payer: 59

## 2016-08-14 DIAGNOSIS — M545 Low back pain: Secondary | ICD-10-CM

## 2016-08-14 DIAGNOSIS — Z0000 Encounter for general adult medical examination without abnormal findings: Secondary | ICD-10-CM | POA: Diagnosis not present

## 2016-08-14 DIAGNOSIS — F329 Major depressive disorder, single episode, unspecified: Secondary | ICD-10-CM

## 2016-08-14 DIAGNOSIS — R7989 Other specified abnormal findings of blood chemistry: Secondary | ICD-10-CM | POA: Diagnosis not present

## 2016-08-14 DIAGNOSIS — J309 Allergic rhinitis, unspecified: Secondary | ICD-10-CM | POA: Diagnosis not present

## 2016-08-14 DIAGNOSIS — E782 Mixed hyperlipidemia: Secondary | ICD-10-CM

## 2016-08-14 DIAGNOSIS — F418 Other specified anxiety disorders: Secondary | ICD-10-CM

## 2016-08-14 DIAGNOSIS — F419 Anxiety disorder, unspecified: Principal | ICD-10-CM

## 2016-08-14 DIAGNOSIS — F32A Depression, unspecified: Secondary | ICD-10-CM

## 2016-08-14 LAB — COMPREHENSIVE METABOLIC PANEL
ALBUMIN: 4.1 g/dL (ref 3.5–5.2)
ALK PHOS: 62 U/L (ref 39–117)
ALT: 14 U/L (ref 0–35)
AST: 15 U/L (ref 0–37)
BUN: 10 mg/dL (ref 6–23)
CO2: 24 meq/L (ref 19–32)
Calcium: 8.7 mg/dL (ref 8.4–10.5)
Chloride: 104 mEq/L (ref 96–112)
Creatinine, Ser: 0.68 mg/dL (ref 0.40–1.20)
GFR: 98.38 mL/min (ref >60.00)
Glucose, Bld: 91 mg/dL (ref 70–99)
Potassium: 3.8 mEq/L (ref 3.5–5.1)
Sodium: 137 mEq/L (ref 135–145)
Total Bilirubin: 0.3 mg/dL (ref 0.2–1.2)
Total Protein: 7.1 g/dL (ref 6.0–8.3)

## 2016-08-14 LAB — LDL CHOLESTEROL, DIRECT: Direct LDL: 186 mg/dL

## 2016-08-14 LAB — LIPID PANEL
CHOL/HDL RATIO: 4
Cholesterol: 271 mg/dL — ABNORMAL HIGH (ref 0–200)
HDL: 61.3 mg/dL (ref >39.00)
NonHDL: 209.45
TRIGLYCERIDES: 204 mg/dL — AB (ref 0.0–149.0)
VLDL: 40.8 mg/dL — AB (ref 0.0–40.0)

## 2016-08-14 LAB — CBC
HCT: 39.7 % (ref 36.0–46.0)
Hemoglobin: 13.5 g/dL (ref 12.0–15.0)
MCHC: 34 g/dL (ref 30.0–36.0)
MCV: 91 fl (ref 78.0–100.0)
Platelets: 348 10*3/uL (ref 150.0–400.0)
RBC: 4.36 Mil/uL (ref 3.87–5.11)
RDW: 13.4 % (ref 11.5–15.5)
WBC: 6.3 10*3/uL (ref 4.0–10.5)

## 2016-08-14 LAB — TSH: TSH: 2.94 u[IU]/mL (ref 0.35–4.50)

## 2016-08-20 ENCOUNTER — Ambulatory Visit (INDEPENDENT_AMBULATORY_CARE_PROVIDER_SITE_OTHER): Payer: 59 | Admitting: Family Medicine

## 2016-08-20 ENCOUNTER — Encounter: Payer: Self-pay | Admitting: Family Medicine

## 2016-08-20 VITALS — BP 120/80 | HR 64 | Temp 98.9°F | Ht 61.0 in | Wt 156.0 lb

## 2016-08-20 DIAGNOSIS — F418 Other specified anxiety disorders: Secondary | ICD-10-CM

## 2016-08-20 DIAGNOSIS — E782 Mixed hyperlipidemia: Secondary | ICD-10-CM | POA: Diagnosis not present

## 2016-08-20 DIAGNOSIS — Z0000 Encounter for general adult medical examination without abnormal findings: Secondary | ICD-10-CM

## 2016-08-20 DIAGNOSIS — F419 Anxiety disorder, unspecified: Secondary | ICD-10-CM

## 2016-08-20 DIAGNOSIS — F32A Depression, unspecified: Secondary | ICD-10-CM

## 2016-08-20 DIAGNOSIS — Z1239 Encounter for other screening for malignant neoplasm of breast: Secondary | ICD-10-CM

## 2016-08-20 DIAGNOSIS — F329 Major depressive disorder, single episode, unspecified: Secondary | ICD-10-CM

## 2016-08-20 NOTE — Assessment & Plan Note (Addendum)
Patient encouraged to maintain heart healthy diet, regular exercise, adequate sleep. Consider daily probiotics. Take medications as prescribed. Given and reviewed copy of ACP documents from [employer] and encouraged to complete and return, mgm ordered

## 2016-08-20 NOTE — Patient Instructions (Signed)
Preventive Care for Adults, Female A healthy lifestyle and preventive care can promote health and wellness. Preventive health guidelines for women include the following key practices.  A routine yearly physical is a good way to check with your health care provider about your health and preventive screening. It is a chance to share any concerns and updates on your health and to receive a thorough exam.  Visit your dentist for a routine exam and preventive care every 6 months. Brush your teeth twice a day and floss once a day. Good oral hygiene prevents tooth decay and gum disease.  The frequency of eye exams is based on your age, health, family medical history, use of contact lenses, and other factors. Follow your health care provider's recommendations for frequency of eye exams.  Eat a healthy diet. Foods like vegetables, fruits, whole grains, low-fat dairy products, and lean protein foods contain the nutrients you need without too many calories. Decrease your intake of foods high in solid fats, added sugars, and salt. Eat the right amount of calories for you.Get information about a proper diet from your health care provider, if necessary.  Regular physical exercise is one of the most important things you can do for your health. Most adults should get at least 150 minutes of moderate-intensity exercise (any activity that increases your heart rate and causes you to sweat) each week. In addition, most adults need muscle-strengthening exercises on 2 or more days a week.  Maintain a healthy weight. The body mass index (BMI) is a screening tool to identify possible weight problems. It provides an estimate of body fat based on height and weight. Your health care provider can find your BMI and can help you achieve or maintain a healthy weight.For adults 20 years and older:  A BMI below 18.5 is considered underweight.  A BMI of 18.5 to 24.9 is normal.  A BMI of 25 to 29.9 is considered overweight.  A  BMI of 30 and above is considered obese.  Maintain normal blood lipids and cholesterol levels by exercising and minimizing your intake of saturated fat. Eat a balanced diet with plenty of fruit and vegetables. Blood tests for lipids and cholesterol should begin at age 45 and be repeated every 5 years. If your lipid or cholesterol levels are high, you are over 50, or you are at high risk for heart disease, you may need your cholesterol levels checked more frequently.Ongoing high lipid and cholesterol levels should be treated with medicines if diet and exercise are not working.  If you smoke, find out from your health care provider how to quit. If you do not use tobacco, do not start.  Lung cancer screening is recommended for adults aged 45-80 years who are at high risk for developing lung cancer because of a history of smoking. A yearly low-dose CT scan of the lungs is recommended for people who have at least a 30-pack-year history of smoking and are a current smoker or have quit within the past 15 years. A pack year of smoking is smoking an average of 1 pack of cigarettes a day for 1 year (for example: 1 pack a day for 30 years or 2 packs a day for 15 years). Yearly screening should continue until the smoker has stopped smoking for at least 15 years. Yearly screening should be stopped for people who develop a health problem that would prevent them from having lung cancer treatment.  If you are pregnant, do not drink alcohol. If you are  breastfeeding, be very cautious about drinking alcohol. If you are not pregnant and choose to drink alcohol, do not have more than 1 drink per day. One drink is considered to be 12 ounces (355 mL) of beer, 5 ounces (148 mL) of wine, or 1.5 ounces (44 mL) of liquor.  Avoid use of street drugs. Do not share needles with anyone. Ask for help if you need support or instructions about stopping the use of drugs.  High blood pressure causes heart disease and increases the risk  of stroke. Your blood pressure should be checked at least every 1 to 2 years. Ongoing high blood pressure should be treated with medicines if weight loss and exercise do not work.  If you are 55-79 years old, ask your health care provider if you should take aspirin to prevent strokes.  Diabetes screening is done by taking a blood sample to check your blood glucose level after you have not eaten for a certain period of time (fasting). If you are not overweight and you do not have risk factors for diabetes, you should be screened once every 3 years starting at age 45. If you are overweight or obese and you are 40-70 years of age, you should be screened for diabetes every year as part of your cardiovascular risk assessment.  Breast cancer screening is essential preventive care for women. You should practice "breast self-awareness." This means understanding the normal appearance and feel of your breasts and may include breast self-examination. Any changes detected, no matter how small, should be reported to a health care provider. Women in their 20s and 30s should have a clinical breast exam (CBE) by a health care provider as part of a regular health exam every 1 to 3 years. After age 40, women should have a CBE every year. Starting at age 40, women should consider having a mammogram (breast X-ray test) every year. Women who have a family history of breast cancer should talk to their health care provider about genetic screening. Women at a high risk of breast cancer should talk to their health care providers about having an MRI and a mammogram every year.  Breast cancer gene (BRCA)-related cancer risk assessment is recommended for women who have family members with BRCA-related cancers. BRCA-related cancers include breast, ovarian, tubal, and peritoneal cancers. Having family members with these cancers may be associated with an increased risk for harmful changes (mutations) in the breast cancer genes BRCA1 and  BRCA2. Results of the assessment will determine the need for genetic counseling and BRCA1 and BRCA2 testing.  Your health care provider may recommend that you be screened regularly for cancer of the pelvic organs (ovaries, uterus, and vagina). This screening involves a pelvic examination, including checking for microscopic changes to the surface of your cervix (Pap test). You may be encouraged to have this screening done every 3 years, beginning at age 21.  For women ages 30-65, health care providers may recommend pelvic exams and Pap testing every 3 years, or they may recommend the Pap and pelvic exam, combined with testing for human papilloma virus (HPV), every 5 years. Some types of HPV increase your risk of cervical cancer. Testing for HPV may also be done on women of any age with unclear Pap test results.  Other health care providers may not recommend any screening for nonpregnant women who are considered low risk for pelvic cancer and who do not have symptoms. Ask your health care provider if a screening pelvic exam is right for   you.  If you have had past treatment for cervical cancer or a condition that could lead to cancer, you need Pap tests and screening for cancer for at least 20 years after your treatment. If Pap tests have been discontinued, your risk factors (such as having a new sexual partner) need to be reassessed to determine if screening should resume. Some women have medical problems that increase the chance of getting cervical cancer. In these cases, your health care provider may recommend more frequent screening and Pap tests.  Colorectal cancer can be detected and often prevented. Most routine colorectal cancer screening begins at the age of 50 years and continues through age 75 years. However, your health care provider may recommend screening at an earlier age if you have risk factors for colon cancer. On a yearly basis, your health care provider may provide home test kits to check  for hidden blood in the stool. Use of a small camera at the end of a tube, to directly examine the colon (sigmoidoscopy or colonoscopy), can detect the earliest forms of colorectal cancer. Talk to your health care provider about this at age 50, when routine screening begins. Direct exam of the colon should be repeated every 5-10 years through age 75 years, unless early forms of precancerous polyps or small growths are found.  People who are at an increased risk for hepatitis B should be screened for this virus. You are considered at high risk for hepatitis B if:  You were born in a country where hepatitis B occurs often. Talk with your health care provider about which countries are considered high risk.  Your parents were born in a high-risk country and you have not received a shot to protect against hepatitis B (hepatitis B vaccine).  You have HIV or AIDS.  You use needles to inject street drugs.  You live with, or have sex with, someone who has hepatitis B.  You get hemodialysis treatment.  You take certain medicines for conditions like cancer, organ transplantation, and autoimmune conditions.  Hepatitis C blood testing is recommended for all people born from 1945 through 1965 and any individual with known risks for hepatitis C.  Practice safe sex. Use condoms and avoid high-risk sexual practices to reduce the spread of sexually transmitted infections (STIs). STIs include gonorrhea, chlamydia, syphilis, trichomonas, herpes, HPV, and human immunodeficiency virus (HIV). Herpes, HIV, and HPV are viral illnesses that have no cure. They can result in disability, cancer, and death.  You should be screened for sexually transmitted illnesses (STIs) including gonorrhea and chlamydia if:  You are sexually active and are younger than 24 years.  You are older than 24 years and your health care provider tells you that you are at risk for this type of infection.  Your sexual activity has changed  since you were last screened and you are at an increased risk for chlamydia or gonorrhea. Ask your health care provider if you are at risk.  If you are at risk of being infected with HIV, it is recommended that you take a prescription medicine daily to prevent HIV infection. This is called preexposure prophylaxis (PrEP). You are considered at risk if:  You are sexually active and do not regularly use condoms or know the HIV status of your partner(s).  You take drugs by injection.  You are sexually active with a partner who has HIV.  Talk with your health care provider about whether you are at high risk of being infected with HIV. If   you choose to begin PrEP, you should first be tested for HIV. You should then be tested every 3 months for as long as you are taking PrEP.  Osteoporosis is a disease in which the bones lose minerals and strength with aging. This can result in serious bone fractures or breaks. The risk of osteoporosis can be identified using a bone density scan. Women ages 67 years and over and women at risk for fractures or osteoporosis should discuss screening with their health care providers. Ask your health care provider whether you should take a calcium supplement or vitamin D to reduce the rate of osteoporosis.  Menopause can be associated with physical symptoms and risks. Hormone replacement therapy is available to decrease symptoms and risks. You should talk to your health care provider about whether hormone replacement therapy is right for you.  Use sunscreen. Apply sunscreen liberally and repeatedly throughout the day. You should seek shade when your shadow is shorter than you. Protect yourself by wearing long sleeves, pants, a wide-brimmed hat, and sunglasses year round, whenever you are outdoors.  Once a month, do a whole body skin exam, using a mirror to look at the skin on your back. Tell your health care provider of new moles, moles that have irregular borders, moles that  are larger than a pencil eraser, or moles that have changed in shape or color.  Stay current with required vaccines (immunizations).  Influenza vaccine. All adults should be immunized every year.  Tetanus, diphtheria, and acellular pertussis (Td, Tdap) vaccine. Pregnant women should receive 1 dose of Tdap vaccine during each pregnancy. The dose should be obtained regardless of the length of time since the last dose. Immunization is preferred during the 27th-36th week of gestation. An adult who has not previously received Tdap or who does not know her vaccine status should receive 1 dose of Tdap. This initial dose should be followed by tetanus and diphtheria toxoids (Td) booster doses every 10 years. Adults with an unknown or incomplete history of completing a 3-dose immunization series with Td-containing vaccines should begin or complete a primary immunization series including a Tdap dose. Adults should receive a Td booster every 10 years.  Varicella vaccine. An adult without evidence of immunity to varicella should receive 2 doses or a second dose if she has previously received 1 dose. Pregnant females who do not have evidence of immunity should receive the first dose after pregnancy. This first dose should be obtained before leaving the health care facility. The second dose should be obtained 4-8 weeks after the first dose.  Human papillomavirus (HPV) vaccine. Females aged 13-26 years who have not received the vaccine previously should obtain the 3-dose series. The vaccine is not recommended for use in pregnant females. However, pregnancy testing is not needed before receiving a dose. If a female is found to be pregnant after receiving a dose, no treatment is needed. In that case, the remaining doses should be delayed until after the pregnancy. Immunization is recommended for any person with an immunocompromised condition through the age of 61 years if she did not get any or all doses earlier. During the  3-dose series, the second dose should be obtained 4-8 weeks after the first dose. The third dose should be obtained 24 weeks after the first dose and 16 weeks after the second dose.  Zoster vaccine. One dose is recommended for adults aged 30 years or older unless certain conditions are present.  Measles, mumps, and rubella (MMR) vaccine. Adults born  before 1957 generally are considered immune to measles and mumps. Adults born in 1957 or later should have 1 or more doses of MMR vaccine unless there is a contraindication to the vaccine or there is laboratory evidence of immunity to each of the three diseases. A routine second dose of MMR vaccine should be obtained at least 28 days after the first dose for students attending postsecondary schools, health care workers, or international travelers. People who received inactivated measles vaccine or an unknown type of measles vaccine during 1963-1967 should receive 2 doses of MMR vaccine. People who received inactivated mumps vaccine or an unknown type of mumps vaccine before 1979 and are at high risk for mumps infection should consider immunization with 2 doses of MMR vaccine. For females of childbearing age, rubella immunity should be determined. If there is no evidence of immunity, females who are not pregnant should be vaccinated. If there is no evidence of immunity, females who are pregnant should delay immunization until after pregnancy. Unvaccinated health care workers born before 1957 who lack laboratory evidence of measles, mumps, or rubella immunity or laboratory confirmation of disease should consider measles and mumps immunization with 2 doses of MMR vaccine or rubella immunization with 1 dose of MMR vaccine.  Pneumococcal 13-valent conjugate (PCV13) vaccine. When indicated, a person who is uncertain of his immunization history and has no record of immunization should receive the PCV13 vaccine. All adults 65 years of age and older should receive this  vaccine. An adult aged 19 years or older who has certain medical conditions and has not been previously immunized should receive 1 dose of PCV13 vaccine. This PCV13 should be followed with a dose of pneumococcal polysaccharide (PPSV23) vaccine. Adults who are at high risk for pneumococcal disease should obtain the PPSV23 vaccine at least 8 weeks after the dose of PCV13 vaccine. Adults older than 47 years of age who have normal immune system function should obtain the PPSV23 vaccine dose at least 1 year after the dose of PCV13 vaccine.  Pneumococcal polysaccharide (PPSV23) vaccine. When PCV13 is also indicated, PCV13 should be obtained first. All adults aged 65 years and older should be immunized. An adult younger than age 65 years who has certain medical conditions should be immunized. Any person who resides in a nursing home or long-term care facility should be immunized. An adult smoker should be immunized. People with an immunocompromised condition and certain other conditions should receive both PCV13 and PPSV23 vaccines. People with human immunodeficiency virus (HIV) infection should be immunized as soon as possible after diagnosis. Immunization during chemotherapy or radiation therapy should be avoided. Routine use of PPSV23 vaccine is not recommended for American Indians, [place] Natives, or people younger than 65 years unless there are medical conditions that require PPSV23 vaccine. When indicated, people who have unknown immunization and have no record of immunization should receive PPSV23 vaccine. One-time revaccination 5 years after the first dose of PPSV23 is recommended for people aged 19-64 years who have chronic kidney failure, nephrotic syndrome, asplenia, or immunocompromised conditions. People who received 1-2 doses of PPSV23 before age 65 years should receive another dose of PPSV23 vaccine at age 65 years or later if at least 5 years have passed since the previous dose. Doses of PPSV23 are not  needed for people immunized with PPSV23 at or after age 65 years.  Meningococcal vaccine. Adults with asplenia or persistent complement component deficiencies should receive 2 doses of quadrivalent meningococcal conjugate (MenACWY-D) vaccine. The doses should be obtained   at least 2 months apart. Microbiologists working with certain meningococcal bacteria, [place] recruits, people at risk during an outbreak, and people who travel to or live in countries with a high rate of meningitis should be immunized. A first-year college student up through age 34 years who is living in a residence hall should receive a dose if she did not receive a dose on or after her 16th birthday. Adults who have certain high-risk conditions should receive one or more doses of vaccine.  Hepatitis A vaccine. Adults who wish to be protected from this disease, have certain high-risk conditions, work with hepatitis A-infected animals, work in hepatitis A research labs, or travel to or work in countries with a high rate of hepatitis A should be immunized. Adults who were previously unvaccinated and who anticipate close contact with an international adoptee during the first 60 days after arrival in the [place] from a country with a high rate of hepatitis A should be immunized.  Hepatitis B vaccine. Adults who wish to be protected from this disease, have certain high-risk conditions, may be exposed to blood or other infectious body fluids, are household contacts or sex partners of hepatitis B positive people, are clients or workers in certain care facilities, or travel to or work in countries with a high rate of hepatitis B should be immunized.  Haemophilus influenzae type b (Hib) vaccine. A previously unvaccinated person with asplenia or sickle cell disease or having a scheduled splenectomy should receive 1 dose of Hib vaccine. Regardless of previous immunization, a recipient of a hematopoietic stem cell transplant should receive a  3-dose series 6-12 months after her successful transplant. Hib vaccine is not recommended for adults with HIV infection. Preventive Services / Frequency Ages 35 to 4 years  Blood pressure check.** / Every 3-5 years.  Lipid and cholesterol check.** / Every 5 years beginning at age 60.  Clinical breast exam.** / Every 3 years for women in their 71s and 10s.  BRCA-related cancer risk assessment.** / For women who have family members with a BRCA-related cancer (breast, ovarian, tubal, or peritoneal cancers).  Pap test.** / Every 2 years from ages 76 through 26. Every 3 years starting at age 61 through age 76 or 93 with a history of 3 consecutive normal Pap tests.  HPV screening.** / Every 3 years from ages 37 through ages 60 to 51 with a history of 3 consecutive normal Pap tests.  Hepatitis C blood test.** / For any individual with known risks for hepatitis C.  Skin self-exam. / Monthly.  Influenza vaccine. / Every year.  Tetanus, diphtheria, and acellular pertussis (Tdap, Td) vaccine.** / Consult your health care provider. Pregnant women should receive 1 dose of Tdap vaccine during each pregnancy. 1 dose of Td every 10 years.  Varicella vaccine.** / Consult your health care provider. Pregnant females who do not have evidence of immunity should receive the first dose after pregnancy.  HPV vaccine. / 3 doses over 6 months, if 93 and younger. The vaccine is not recommended for use in pregnant females. However, pregnancy testing is not needed before receiving a dose.  Measles, mumps, rubella (MMR) vaccine.** / You need at least 1 dose of MMR if you were born in 1957 or later. You may also need a 2nd dose. For females of childbearing age, rubella immunity should be determined. If there is no evidence of immunity, females who are not pregnant should be vaccinated. If there is no evidence of immunity, females who are  pregnant should delay immunization until after pregnancy.  Pneumococcal  13-valent conjugate (PCV13) vaccine.** / Consult your health care provider.  Pneumococcal polysaccharide (PPSV23) vaccine.** / 1 to 2 doses if you smoke cigarettes or if you have certain conditions.  Meningococcal vaccine.** / 1 dose if you are age 68 to 8 years and a Market researcher living in a residence hall, or have one of several medical conditions, you need to get vaccinated against meningococcal disease. You may also need additional booster doses.  Hepatitis A vaccine.** / Consult your health care provider.  Hepatitis B vaccine.** / Consult your health care provider.  Haemophilus influenzae type b (Hib) vaccine.** / Consult your health care provider. Ages 7 to 53 years  Blood pressure check.** / Every year.  Lipid and cholesterol check.** / Every 5 years beginning at age 25 years.  Lung cancer screening. / Every year if you are aged 11-80 years and have a 30-pack-year history of smoking and currently smoke or have quit within the past 15 years. Yearly screening is stopped once you have quit smoking for at least 15 years or develop a health problem that would prevent you from having lung cancer treatment.  Clinical breast exam.** / Every year after age 48 years.  BRCA-related cancer risk assessment.** / For women who have family members with a BRCA-related cancer (breast, ovarian, tubal, or peritoneal cancers).  Mammogram.** / Every year beginning at age 41 years and continuing for as long as you are in good health. Consult with your health care provider.  Pap test.** / Every 3 years starting at age 65 years through age 37 or 70 years with a history of 3 consecutive normal Pap tests.  HPV screening.** / Every 3 years from ages 72 years through ages 60 to 40 years with a history of 3 consecutive normal Pap tests.  Fecal occult blood test (FOBT) of stool. / Every year beginning at age 21 years and continuing until age 5 years. You may not need to do this test if you get  a colonoscopy every 10 years.  Flexible sigmoidoscopy or colonoscopy.** / Every 5 years for a flexible sigmoidoscopy or every 10 years for a colonoscopy beginning at age 35 years and continuing until age 48 years.  Hepatitis C blood test.** / For all people born from [birth year] through 1965 and any individual with known risks for hepatitis C.  Skin self-exam. / Monthly.  Influenza vaccine. / Every year.  Tetanus, diphtheria, and acellular pertussis (Tdap/Td) vaccine.** / Consult your health care provider. Pregnant women should receive 1 dose of Tdap vaccine during each pregnancy. 1 dose of Td every 10 years.  Varicella vaccine.** / Consult your health care provider. Pregnant females who do not have evidence of immunity should receive the first dose after pregnancy.  Zoster vaccine.** / 1 dose for adults aged 30 years or older.  Measles, mumps, rubella (MMR) vaccine.** / You need at least 1 dose of MMR if you were born in 1957 or later. You may also need a second dose. For females of childbearing age, rubella immunity should be determined. If there is no evidence of immunity, females who are not pregnant should be vaccinated. If there is no evidence of immunity, females who are pregnant should delay immunization until after pregnancy.  Pneumococcal 13-valent conjugate (PCV13) vaccine.** / Consult your health care provider.  Pneumococcal polysaccharide (PPSV23) vaccine.** / 1 to 2 doses if you smoke cigarettes or if you have certain conditions.  Meningococcal vaccine.** /  Consult your health care provider.  Hepatitis A vaccine.** / Consult your health care provider.  Hepatitis B vaccine.** / Consult your health care provider.  Haemophilus influenzae type b (Hib) vaccine.** / Consult your health care provider. Ages 64 years and over  Blood pressure check.** / Every year.  Lipid and cholesterol check.** / Every 5 years beginning at age 23 years.  Lung cancer screening. / Every year if you  are aged 16-80 years and have a 30-pack-year history of smoking and currently smoke or have quit within the past 15 years. Yearly screening is stopped once you have quit smoking for at least 15 years or develop a health problem that would prevent you from having lung cancer treatment.  Clinical breast exam.** / Every year after age 74 years.  BRCA-related cancer risk assessment.** / For women who have family members with a BRCA-related cancer (breast, ovarian, tubal, or peritoneal cancers).  Mammogram.** / Every year beginning at age 44 years and continuing for as long as you are in good health. Consult with your health care provider.  Pap test.** / Every 3 years starting at age 58 years through age 22 or 39 years with 3 consecutive normal Pap tests. Testing can be stopped between 65 and 70 years with 3 consecutive normal Pap tests and no abnormal Pap or HPV tests in the past 10 years.  HPV screening.** / Every 3 years from ages 64 years through ages 70 or 61 years with a history of 3 consecutive normal Pap tests. Testing can be stopped between 65 and 70 years with 3 consecutive normal Pap tests and no abnormal Pap or HPV tests in the past 10 years.  Fecal occult blood test (FOBT) of stool. / Every year beginning at age 40 years and continuing until age 27 years. You may not need to do this test if you get a colonoscopy every 10 years.  Flexible sigmoidoscopy or colonoscopy.** / Every 5 years for a flexible sigmoidoscopy or every 10 years for a colonoscopy beginning at age 7 years and continuing until age 32 years.  Hepatitis C blood test.** / For all people born from [birth year] through 1965 and any individual with known risks for hepatitis C.  Osteoporosis screening.** / A one-time screening for women ages 30 years and over and women at risk for fractures or osteoporosis.  Skin self-exam. / Monthly.  Influenza vaccine. / Every year.  Tetanus, diphtheria, and acellular pertussis (Tdap/Td)  vaccine.** / 1 dose of Td every 10 years.  Varicella vaccine.** / Consult your health care provider.  Zoster vaccine.** / 1 dose for adults aged 35 years or older.  Pneumococcal 13-valent conjugate (PCV13) vaccine.** / Consult your health care provider.  Pneumococcal polysaccharide (PPSV23) vaccine.** / 1 dose for all adults aged 46 years and older.  Meningococcal vaccine.** / Consult your health care provider.  Hepatitis A vaccine.** / Consult your health care provider.  Hepatitis B vaccine.** / Consult your health care provider.  Haemophilus influenzae type b (Hib) vaccine.** / Consult your health care provider. ** Family history and personal history of risk and conditions may change your health care provider's recommendations.   This information is not intended to replace advice given to you by your health care provider. Make sure you discuss any questions you have with your health care provider.   Document Released: 02/05/2002 Document Revised: 12/31/2014 Document Reviewed: 05/07/2011 Elsevier Interactive Patient Education [employer].

## 2016-08-20 NOTE — Progress Notes (Signed)
Pre visit review using our clinic review tool, if applicable. No additional management support is needed unless otherwise documented below in the visit note. 

## 2016-08-20 NOTE — Assessment & Plan Note (Addendum)
Encouraged heart healthy diet, increase exercise, avoid trans fats, take flaxseed daily

## 2016-08-21 ENCOUNTER — Ambulatory Visit (HOSPITAL_BASED_OUTPATIENT_CLINIC_OR_DEPARTMENT_OTHER)
Admission: RE | Admit: 2016-08-21 | Discharge: 2016-08-21 | Disposition: A | Discharge status: Home / Self Care | Payer: 59 | Source: Ambulatory Visit | Attending: Family Medicine | Admitting: Family Medicine

## 2016-08-21 DIAGNOSIS — Z1231 Encounter for screening mammogram for malignant neoplasm of breast: Secondary | ICD-10-CM | POA: Insufficient documentation

## 2016-08-21 DIAGNOSIS — Z1239 Encounter for other screening for malignant neoplasm of breast: Secondary | ICD-10-CM

## 2016-09-02 NOTE — Assessment & Plan Note (Signed)
Doing well on increased Paroxetine.no new concerns.

## 2016-09-02 NOTE — Progress Notes (Signed)
Patient ID: Tricia Potts, female   DOB: 03/01/1971, 45 y.o.   MRN: [id number]   Subjective:    Patient ID: Tricia Potts, female    DOB: 19-Mar-1971, 45 y.o.   MRN: [id number]  Chief Complaint  Patient presents with  . Annual Exam    HPI Patient is in today for annual preventative exam. She feels well today. No recent illness or acute concerns. She feels better with increased Paroxetine. No concerning side effects. Is trying to eat well and maintain some exercise although she acknowledges the exercis is not always consistent. Denies CP/palp/SOB/HA/congestion/fevers/GI or GU c/o. Taking meds as prescribed  Past Medical History:  Diagnosis Date  . Allergy   . Anemia 06/18/2014  . Anxiety   . Anxiety and depression 02/17/2009   Qualifier: Diagnosis of  By: Redmond Pulling MD, Frann Rider    . Asthma   . Broken ankle 2011   (left) roller skating  . Bronchitis, mucopurulent recurrent ([place]) 04/21/2013  . Cervical cancer screening 02/20/2016  . Hemorrhoid   . History of hidradenitis suppurativa   . Hyperlipemia   . IBS (irritable bowel syndrome)   . Low back pain 05/25/2016  . PCO (polycystic ovaries)     Past Surgical History:  Procedure Laterality Date  . ANKLE SURGERY     plate and 8 screws in left ankle  . HEMORRHOID SURGERY      Family History  Problem Relation Age of Onset  . Ovarian cancer      Grandmother  . Arthritis    . Allergies Mother   . COPD Mother   . Heart disease Father     mitral valve disease/rupture during physical stress  . GER disease Father   . Irritable bowel syndrome Father   . Heart failure Father   . Hepatitis C Brother   . Cancer Brother 17    ALL  . Heart disease Daughter     asd s/p repair at age 65  . Anxiety disorder Daughter   . Cancer Maternal Grandmother     ovarian cancer  . Heart disease Maternal Grandfather     MI at 32  . Kidney disease Paternal Grandfather     possible kidney cancer  . Allergies Sister   . Eczema Sister   . Cancer  Sister 91    breast, DCIS  . Allergies Daughter     Social History   Social History  . Marital status: Married    Spouse name: N/A  . Number of children: N/A  . Years of education: N/A   Occupational History  . Not on file.   Social History Main Topics  . Smoking status: Never Smoker  . Smokeless tobacco: Never Used  . Alcohol use No     Comment: vegetarian  . Drug use: No  . Sexual activity: Yes    Partners: Male    Birth control/ protection: Other-see comments     Comment: vasectomy   Other Topics Concern  . Not on file   Social History Narrative  . No narrative on file    Outpatient Medications Prior to Visit  Medication Sig Dispense Refill  . ALPRAZolam (XANAX) 0.25 MG tablet Take 1 tablet (0.25 mg total) by mouth 2 (two) times daily as needed for anxiety. 40 tablet 1  . cetirizine (ZYRTEC) 10 MG tablet Take 10 mg by mouth as needed.     . Flaxseed, Linseed, (FLAXSEED OIL PO) Take by mouth.    . fluticasone (FLONASE) 50  MCG/ACT nasal spray PLACE 2 SPRAYS INTO THE NOSE DAILY 16 g 8  . Multiple Vitamin (MULTI-VITAMIN DAILY PO) Take by mouth daily.    Marland Kitchen OVER THE COUNTER MEDICATION OTC eye drops    . PARoxetine (PAXIL) 20 MG tablet Take 1.5 tablets (30 mg total) by mouth daily. 45 tablet 5  . Probiotic Product (PRO-BIOTIC BLEND PO) Take by mouth daily.    . Methylcobalamin 1 MG CHEW Chew by mouth once a week.     No facility-administered medications prior to visit.     No Known Allergies  Review of Systems  Constitutional: Negative for chills, fever and malaise/fatigue.  HENT: Negative for congestion and hearing loss.   Eyes: Negative for discharge.  Respiratory: Negative for cough, sputum production and shortness of breath.   Cardiovascular: Negative for chest pain, palpitations and leg swelling.  Gastrointestinal: Negative for abdominal pain, blood in stool, constipation, diarrhea, heartburn, nausea and vomiting.  Genitourinary: Negative for dysuria,  frequency, hematuria and urgency.  Musculoskeletal: Negative for back pain, falls and myalgias.  Skin: Negative for rash.  Neurological: Negative for dizziness, sensory change, loss of consciousness, weakness and headaches.  Endo/Heme/Allergies: Negative for environmental allergies. Does not bruise/bleed easily.  Psychiatric/Behavioral: Negative for depression and suicidal ideas. The patient is not nervous/anxious and does not have insomnia.        Objective:    Physical Exam  Constitutional: She is oriented to person, place, and time. She appears well-developed and well-nourished. No distress.  HENT:  Head: Normocephalic and atraumatic.  Eyes: Conjunctivae are normal.  Neck: Neck supple. No thyromegaly present.  Cardiovascular: Normal rate, regular rhythm and normal heart sounds.   No murmur heard. Pulmonary/Chest: Effort normal and breath sounds normal. No respiratory distress.  Abdominal: Soft. Bowel sounds are normal. She exhibits no distension and no mass. There is no tenderness.  Musculoskeletal: She exhibits no edema.  Lymphadenopathy:    She has no cervical adenopathy.  Neurological: She is alert and oriented to person, place, and time.  Skin: Skin is warm and dry.  Psychiatric: She has a normal mood and affect. Her behavior is normal.    BP 120/80 (BP Location: Right Arm, Patient Position: Sitting, Cuff Size: Normal)   Pulse 64   Temp 98.9 F (37.2 C) (Oral)   Ht 5\' 1"  (1.549 m)   Wt 156 lb (70.8 kg)   SpO2 99%   BMI 29.48 kg/m  Wt Readings from Last 3 Encounters:  08/20/16 156 lb (70.8 kg)  05/25/16 156 lb 4 oz (70.9 kg)  02/20/16 151 lb 4 oz (68.6 kg)     Lab Results  Component Value Date   WBC 6.3 08/14/2016   HGB 13.5 08/14/2016   HCT 39.7 08/14/2016   PLT 348.0 08/14/2016   GLUCOSE 91 08/14/2016   CHOL 271 (H) 08/14/2016   TRIG 204.0 (H) 08/14/2016   HDL 61.30 08/14/2016   LDLDIRECT 186.0 08/14/2016   LDLCALC 162 (H) 06/08/2015   ALT 14  08/14/2016   AST 15 08/14/2016   NA 137 08/14/2016   K 3.8 08/14/2016   CL 104 08/14/2016   CREATININE 0.68 08/14/2016   BUN 10 08/14/2016   CO2 24 08/14/2016   TSH 2.94 08/14/2016    Lab Results  Component Value Date   TSH 2.94 08/14/2016   Lab Results  Component Value Date   WBC 6.3 08/14/2016   HGB 13.5 08/14/2016   HCT 39.7 08/14/2016   MCV 91.0 08/14/2016   PLT 348.0  08/14/2016   Lab Results  Component Value Date   NA 137 08/14/2016   K 3.8 08/14/2016   CO2 24 08/14/2016   GLUCOSE 91 08/14/2016   BUN 10 08/14/2016   CREATININE 0.68 08/14/2016   BILITOT 0.3 08/14/2016   ALKPHOS 62 08/14/2016   AST 15 08/14/2016   ALT 14 08/14/2016   PROT 7.1 08/14/2016   ALBUMIN 4.1 08/14/2016   CALCIUM 8.7 08/14/2016   GFR 98.38 08/14/2016   Lab Results  Component Value Date   CHOL 271 (H) 08/14/2016   Lab Results  Component Value Date   HDL 61.30 08/14/2016   Lab Results  Component Value Date   LDLCALC 162 (H) 06/08/2015   Lab Results  Component Value Date   TRIG 204.0 (H) 08/14/2016   Lab Results  Component Value Date   CHOLHDL 4 08/14/2016   No results found for: HGBA1C     Assessment & Plan:   Problem List Items Addressed This Visit    Hyperlipidemia, mixed    Encouraged heart healthy diet, increase exercise, avoid trans fats, take flaxseed daily      Relevant Orders   Lipid panel   Comprehensive metabolic panel   Anxiety and depression    Doing well on increased Paroxetine.no new concerns.       Preventative health care    Patient encouraged to maintain heart healthy diet, regular exercise, adequate sleep. Consider daily probiotics. Take medications as prescribed. Given and reviewed copy of ACP documents from [place] and encouraged to complete and return, mgm ordered       Other Visit Diagnoses    Breast cancer screening    -  Primary   Relevant Orders   MM Digital Screening (Completed)      I have discontinued Ms.  Dorothyann Gibbs Methylcobalamin. I am also having her maintain her cetirizine, OVER THE COUNTER MEDICATION, Multiple Vitamin (MULTI-VITAMIN DAILY PO), Probiotic Product (PRO-BIOTIC BLEND PO), (Flaxseed, Linseed, (FLAXSEED OIL PO)), ALPRAZolam, PARoxetine, and fluticasone.  No orders of the defined types were placed in this encounter.    Penni Homans, MD

## 2016-12-04 ENCOUNTER — Other Ambulatory Visit: Payer: Self-pay | Admitting: Family Medicine

## 2017-01-20 ENCOUNTER — Other Ambulatory Visit: Payer: Self-pay | Admitting: Family Medicine

## 2017-03-13 ENCOUNTER — Other Ambulatory Visit: Payer: Self-pay | Admitting: Family Medicine

## 2017-03-21 ENCOUNTER — Telehealth: Payer: Self-pay | Admitting: Family Medicine

## 2017-03-21 MED ORDER — PAROXETINE HCL 20 MG PO TABS: ORAL_TABLET | ORAL | 0 refills | Status: DC

## 2017-03-21 NOTE — Telephone Encounter (Signed)
Refill sent in

## 2017-03-21 NOTE — Telephone Encounter (Signed)
Caller name: Relationship to patient: Self Can be reached: [phone]  Pharmacy:  [facility] [place], [address] AT [address] [phone] (Phone) [phone] (Fax     Reason for call: Refill Paxil

## 2017-03-22 ENCOUNTER — Encounter: Payer: Self-pay | Admitting: Family Medicine

## 2017-03-25 ENCOUNTER — Other Ambulatory Visit: Payer: Self-pay | Admitting: Family Medicine

## 2017-03-25 MED ORDER — PAROXETINE HCL 20 MG PO TABS: ORAL_TABLET | ORAL | 1 refills | Status: DC

## 2017-05-10 ENCOUNTER — Other Ambulatory Visit (INDEPENDENT_AMBULATORY_CARE_PROVIDER_SITE_OTHER): Payer: Self-pay

## 2017-05-10 DIAGNOSIS — E782 Mixed hyperlipidemia: Secondary | ICD-10-CM

## 2017-05-10 LAB — LIPID PANEL
Cholesterol: 232 mg/dL — ABNORMAL HIGH (ref 0–200)
HDL: 60.2 mg/dL (ref >39.00)
LDL Cholesterol: 142 mg/dL — ABNORMAL HIGH (ref 0–99)
NonHDL: 171.33
TRIGLYCERIDES: 147 mg/dL (ref 0.0–149.0)
Total CHOL/HDL Ratio: 4
VLDL: 29.4 mg/dL (ref 0.0–40.0)

## 2017-05-10 LAB — COMPREHENSIVE METABOLIC PANEL
ALT: 14 U/L (ref 0–35)
AST: 17 U/L (ref 0–37)
Albumin: 4.2 g/dL (ref 3.5–5.2)
Alkaline Phosphatase: 62 U/L (ref 39–117)
BUN: 16 mg/dL (ref 6–23)
CO2: 27 mEq/L (ref 19–32)
Calcium: 9.4 mg/dL (ref 8.4–10.5)
Chloride: 104 mEq/L (ref 96–112)
Creatinine, Ser: 0.78 mg/dL (ref 0.40–1.20)
GFR: 83.71 mL/min (ref >60.00)
Glucose, Bld: 90 mg/dL (ref 70–99)
Potassium: 4 mEq/L (ref 3.5–5.1)
Sodium: 139 mEq/L (ref 135–145)
Total Bilirubin: 0.6 mg/dL (ref 0.2–1.2)
Total Protein: 7.1 g/dL (ref 6.0–8.3)

## 2017-05-14 ENCOUNTER — Encounter: Payer: Self-pay | Admitting: Family Medicine

## 2017-05-14 ENCOUNTER — Ambulatory Visit (INDEPENDENT_AMBULATORY_CARE_PROVIDER_SITE_OTHER): Payer: 59 | Admitting: Family Medicine

## 2017-05-14 VITALS — BP 110/68 | HR 74 | Temp 98.4°F | Resp 18 | Wt 156.4 lb

## 2017-05-14 DIAGNOSIS — E782 Mixed hyperlipidemia: Secondary | ICD-10-CM | POA: Diagnosis not present

## 2017-05-14 DIAGNOSIS — F419 Anxiety disorder, unspecified: Secondary | ICD-10-CM

## 2017-05-14 DIAGNOSIS — F329 Major depressive disorder, single episode, unspecified: Secondary | ICD-10-CM | POA: Diagnosis not present

## 2017-05-14 DIAGNOSIS — F32A Depression, unspecified: Secondary | ICD-10-CM

## 2017-05-14 MED ORDER — FLUTICASONE PROPIONATE 50 MCG/ACT NA SUSP: NASAL | 8 refills | Status: DC

## 2017-05-14 MED ORDER — PAROXETINE HCL 20 MG PO TABS: ORAL_TABLET | ORAL | 1 refills | Status: DC

## 2017-05-14 MED ORDER — ALPRAZOLAM 0.25 MG PO TABS: 0.2500 mg | ORAL_TABLET | Freq: Two times a day (BID) | ORAL | 1 refills | Status: DC | PRN

## 2017-05-14 NOTE — Progress Notes (Signed)
Subjective:  I acted as a Education administrator for Dr. Charlett Blake. Tricia Potts, [place]  Patient ID: Tricia Potts, female    DOB: 04/26/1971, 46 y.o.   MRN: [id number]  No chief complaint on file.   HPI  Patient is in today for a follow up. She is following up on hyperlipidemia. She has no acute concerns. She feels the Paroxetine increase to 30 mg daily has been good and she feels well. Denies any anhedonia or suicidal ideation. Denies CP/palp/SOB/HA/congestion/fevers/GI or GU c/o. Taking meds as prescribed  Patient Care Team: Mosie Lukes, MD as PCP - General (Family Medicine) Elizebeth Koller Mora Appl, MD (Inactive) as Referring Physician (Internal Medicine)   Past Medical History:  Diagnosis Date  . Allergy   . Anemia 06/18/2014  . Anxiety   . Anxiety and depression 02/17/2009   Qualifier: Diagnosis of  By: Redmond Pulling MD, Frann Rider    . Asthma   . Broken ankle 2011   (left) roller skating  . Bronchitis, mucopurulent recurrent ([place]) 04/21/2013  . Cervical cancer screening 02/20/2016  . Hemorrhoid   . History of hidradenitis suppurativa   . Hyperlipemia   . IBS (irritable bowel syndrome)   . Low back pain 05/25/2016  . PCO (polycystic ovaries)     Past Surgical History:  Procedure Laterality Date  . ANKLE SURGERY     plate and 8 screws in left ankle  . HEMORRHOID SURGERY      Family History  Problem Relation Age of Onset  . Ovarian cancer Unknown        Grandmother  . Arthritis Unknown   . Allergies Mother   . COPD Mother   . Heart disease Father        mitral valve disease/rupture during physical stress  . GER disease Father   . Irritable bowel syndrome Father   . Heart failure Father   . Hepatitis C Brother   . Cancer Brother 17       ALL  . Heart disease Daughter        asd s/p repair at age 28  . Anxiety disorder Daughter   . Cancer Maternal Grandmother        ovarian cancer  . Heart disease Maternal Grandfather        MI at 35  . Kidney disease Paternal Grandfather        possible  kidney cancer  . Allergies Sister   . Eczema Sister   . Cancer Sister 79       breast, DCIS  . Allergies Daughter     Social History   Social History  . Marital status: Married    Spouse name: N/A  . Number of children: N/A  . Years of education: N/A   Occupational History  . Not on file.   Social History Main Topics  . Smoking status: Never Smoker  . Smokeless tobacco: Never Used  . Alcohol use No     Comment: vegetarian  . Drug use: No  . Sexual activity: Yes    Partners: Male    Birth control/ protection: Other-see comments     Comment: vasectomy   Other Topics Concern  . Not on file   Social History Narrative  . No narrative on file    Outpatient Medications Prior to Visit  Medication Sig Dispense Refill  . Flaxseed, Linseed, (FLAXSEED OIL PO) Take by mouth.    . Multiple Vitamin (MULTI-VITAMIN DAILY PO) Take by mouth daily.    Marland Kitchen OVER THE  COUNTER MEDICATION OTC eye drops    . Probiotic Product (PRO-BIOTIC BLEND PO) Take by mouth daily.    Marland Kitchen ALPRAZolam (XANAX) 0.25 MG tablet Take 1 tablet (0.25 mg total) by mouth 2 (two) times daily as needed for anxiety. 40 tablet 1  . cetirizine (ZYRTEC) 10 MG tablet Take 10 mg by mouth as needed.     . fluticasone (FLONASE) 50 MCG/ACT nasal spray PLACE 2 SPRAYS INTO THE NOSE DAILY 16 g 8  . PARoxetine (PAXIL) 20 MG tablet Take 1 and 1/2 tablet (30 mg) by mouth daily 45 tablet 1  . fluticasone (FLONASE) 50 MCG/ACT nasal spray INSTILL 2 SPRAY IN EACH NOSTRIL EVERY DAY 16 g 1   No facility-administered medications prior to visit.     No Known Allergies  Review of Systems  Constitutional: Negative for fever and malaise/fatigue.  HENT: Negative for congestion.   Eyes: Negative for blurred vision.  Respiratory: Negative for shortness of breath.   Cardiovascular: Negative for chest pain, palpitations and leg swelling.  Gastrointestinal: Negative for abdominal pain, blood in stool and nausea.  Genitourinary: Negative for  dysuria and frequency.  Musculoskeletal: Negative for falls.  Skin: Negative for rash.  Neurological: Negative for dizziness, loss of consciousness and headaches.  Endo/Heme/Allergies: Negative for environmental allergies.  Psychiatric/Behavioral: Negative for depression. The patient is not nervous/anxious.        Objective:    Physical Exam  Constitutional: She is oriented to person, place, and time. She appears well-developed and well-nourished. No distress.  HENT:  Head: Normocephalic and atraumatic.  Nose: Nose normal.  Eyes: Right eye exhibits no discharge. Left eye exhibits no discharge.  Neck: Normal range of motion. Neck supple.  Cardiovascular: Normal rate and regular rhythm.   No murmur heard. Pulmonary/Chest: Effort normal and breath sounds normal.  Abdominal: Soft. Bowel sounds are normal. There is no tenderness.  Musculoskeletal: She exhibits no edema.  Neurological: She is alert and oriented to person, place, and time.  Skin: Skin is warm and dry.  Psychiatric: She has a normal mood and affect.  Nursing note and vitals reviewed.   BP 110/68 (BP Location: Left Arm, Patient Position: Sitting, Cuff Size: Normal)   Pulse 74   Temp 98.4 F (36.9 C) (Oral)   Resp 18   Wt 156 lb 6.4 oz (70.9 kg)   SpO2 98%   BMI 29.55 kg/m  Wt Readings from Last 3 Encounters:  05/14/17 156 lb 6.4 oz (70.9 kg)  08/20/16 156 lb (70.8 kg)  05/25/16 156 lb 4 oz (70.9 kg)   BP Readings from Last 3 Encounters:  05/14/17 110/68  08/20/16 120/80  05/25/16 112/86     Immunization History  Administered Date(s) Administered  . Influenza Whole 09/23/2012, 09/30/2013  . Influenza-Unspecified 09/23/2014, 09/26/2015  . Pneumococcal Conjugate-13 10/25/2014  . Td 03/07/2010  . Tdap 06/21/2014    Health Maintenance  Topic Date Due  . HIV Screening  02/27/1984  . INFLUENZA VACCINE  07/24/2017  . MAMMOGRAM  08/21/2017  . PAP SMEAR  02/19/2019  . TETANUS/TDAP  06/21/2024    Lab  Results  Component Value Date   WBC 6.3 08/14/2016   HGB 13.5 08/14/2016   HCT 39.7 08/14/2016   PLT 348.0 08/14/2016   GLUCOSE 90 05/10/2017   CHOL 232 (H) 05/10/2017   TRIG 147.0 05/10/2017   HDL 60.20 05/10/2017   LDLDIRECT 186.0 08/14/2016   LDLCALC 142 (H) 05/10/2017   ALT 14 05/10/2017   AST 17 05/10/2017  NA 139 05/10/2017   K 4.0 05/10/2017   CL 104 05/10/2017   CREATININE 0.78 05/10/2017   BUN 16 05/10/2017   CO2 27 05/10/2017   TSH 2.94 08/14/2016    Lab Results  Component Value Date   TSH 2.94 08/14/2016   Lab Results  Component Value Date   WBC 6.3 08/14/2016   HGB 13.5 08/14/2016   HCT 39.7 08/14/2016   MCV 91.0 08/14/2016   PLT 348.0 08/14/2016   Lab Results  Component Value Date   NA 139 05/10/2017   K 4.0 05/10/2017   CO2 27 05/10/2017   GLUCOSE 90 05/10/2017   BUN 16 05/10/2017   CREATININE 0.78 05/10/2017   BILITOT 0.6 05/10/2017   ALKPHOS 62 05/10/2017   AST 17 05/10/2017   ALT 14 05/10/2017   PROT 7.1 05/10/2017   ALBUMIN 4.2 05/10/2017   CALCIUM 9.4 05/10/2017   GFR 83.71 05/10/2017   Lab Results  Component Value Date   CHOL 232 (H) 05/10/2017   Lab Results  Component Value Date   HDL 60.20 05/10/2017   Lab Results  Component Value Date   LDLCALC 142 (H) 05/10/2017   Lab Results  Component Value Date   TRIG 147.0 05/10/2017   Lab Results  Component Value Date   CHOLHDL 4 05/10/2017   No results found for: HGBA1C       Assessment & Plan:   Problem List Items Addressed This Visit    Hyperlipidemia, mixed - Primary    Encouraged heart healthy diet, increase exercise, avoid trans fats, consider a krill oil cap daily. Has started [employer] and increased exercise at home.,      Relevant Orders   CBC   Comprehensive metabolic panel   Lipid panel   TSH   Anxiety and depression    Doing well with Paroxetine and uses Alprazolam very rarely but when she needs it it is helpful. Given refill today         I  have discontinued Ms. Dorothyann Gibbs cetirizine. I am also having her maintain her OVER THE COUNTER MEDICATION, Multiple Vitamin (MULTI-VITAMIN DAILY PO), Probiotic Product (PRO-BIOTIC BLEND PO), (Flaxseed, Linseed, (FLAXSEED OIL PO)), loratadine, fluticasone, ALPRAZolam, and PARoxetine.  Meds ordered this encounter  Medications  . loratadine (CLARITIN) 10 MG tablet    Sig: Take 10 mg by mouth daily.  . fluticasone (FLONASE) 50 MCG/ACT nasal spray    Sig: PLACE 2 SPRAYS INTO THE NOSE DAILY    Dispense:  16 g    Refill:  8  . DISCONTD: PARoxetine (PAXIL) 20 MG tablet    Sig: Take 1 and 1/2 tablet (30 mg) by mouth daily    Dispense:  45 tablet    Refill:  1  . ALPRAZolam (XANAX) 0.25 MG tablet    Sig: Take 1 tablet (0.25 mg total) by mouth 2 (two) times daily as needed for anxiety.    Dispense:  40 tablet    Refill:  1  . PARoxetine (PAXIL) 20 MG tablet    Sig: Take 1 and 1/2 tablet (30 mg) by mouth daily    Dispense:  135 tablet    Refill:  1    CMA served as scribe during this visit. History, Physical and Plan performed by medical provider. Documentation and orders reviewed and attested to.  Penni Homans, MD

## 2017-05-14 NOTE — Assessment & Plan Note (Signed)
Doing well with Paroxetine and uses Alprazolam very rarely but when she needs it it is helpful. Given refill today

## 2017-05-14 NOTE — Assessment & Plan Note (Signed)
Encouraged heart healthy diet, increase exercise, avoid trans fats, consider a krill oil cap daily. Has started [employer] and increased exercise at home.,

## 2017-05-14 NOTE — Patient Instructions (Addendum)
Cholesterol Cholesterol is a white, waxy, fat-like substance that is needed by the human body in small amounts. The liver makes all the cholesterol we need. Cholesterol is carried from the liver by the blood through the blood vessels. Deposits of cholesterol (plaques) may build up on blood vessel (artery) walls. Plaques make the arteries narrower and stiffer. Cholesterol plaques increase the risk for heart attack and stroke. You cannot feel your cholesterol level even if it is very high. The only way to know that it is high is to have a blood test. Once you know your cholesterol levels, you should keep a record of the test results. Work with your health care provider to keep your levels in the desired range. What do the results mean?  Total cholesterol is a rough measure of all the cholesterol in your blood.  LDL (low-density lipoprotein) is the "bad" cholesterol. This is the type that causes plaque to build up on the artery walls. You want this level to be low.  HDL (high-density lipoprotein) is the "good" cholesterol because it cleans the arteries and carries the LDL away. You want this level to be high.  Triglycerides are fat that the body can either burn for energy or store. High levels are closely linked to heart disease. What are the desired levels of cholesterol?  Total cholesterol below 200.  LDL below 100 for people who are at risk, below 70 for people at very high risk.  HDL above 40 is good. A level of 60 or higher is considered to be protective against heart disease.  Triglycerides below 150. How can I lower my cholesterol? Diet  Follow your diet program as told by your health care provider.  Choose fish or white meat chicken and [place], roasted or baked. Limit fatty cuts of red meat, fried foods, and processed meats, such as sausage and lunch meats.  Eat lots of fresh fruits and vegetables.  Choose whole grains, beans, pasta, potatoes, and cereals.  Choose olive oil,  corn oil, or canola oil, and use only small amounts.  Avoid butter, mayonnaise, shortening, or palm kernel oils.  Avoid foods with trans fats.  Drink skim or nonfat milk and eat low-fat or nonfat yogurt and cheeses. Avoid whole milk, cream, ice cream, egg yolks, and full-fat cheeses.  Healthier desserts include angel food cake, ginger snaps, animal crackers, hard candy, popsicles, and low-fat or nonfat frozen yogurt. Avoid pastries, cakes, pies, and cookies. Exercise  Follow your exercise program as told by your health care provider. A regular program:  Helps to decrease LDL and raise HDL.  Helps with weight control. Do things that increase your activity level, such as gardening, walking, and taking the stairs. Ganglion Cyst A ganglion cyst is a noncancerous, fluid-filled lump that occurs near joints or tendons. The ganglion cyst grows out of a joint or the lining of a tendon. It most often develops in the hand or wrist, but it can also develop in the shoulder, elbow, hip, knee, ankle, or foot. The round or oval ganglion cyst can be the size of a pea or larger than a grape. Increased activity may enlarge the size of the cyst because more fluid starts to build up. What are the causes? It is not known what causes a ganglion cyst to grow. However, it may be related to: Inflammation or irritation around the joint. An injury. Repetitive movements or overuse. Arthritis. What increases the risk? Risk factors include: Being a woman. Being age 48-48. What are  the signs or symptoms? Symptoms may include: A lump. This most often appears on the hand or wrist, but it can occur in other areas of the body. Tingling. Pain. Numbness. Muscle weakness. Weak grip. Less movement in a joint. How is this diagnosed? Ganglion cysts are most often diagnosed based on a physical exam. Your health care provider will feel the lump and may shine a light alongside it. If it is a ganglion cyst, a light often  shines through it. Your health care provider may order an X-ray, ultrasound, or MRI to rule out other conditions. How is this treated? Ganglion cysts usually go away on their own without treatment. If pain or other symptoms are involved, treatment may be needed. Treatment is also needed if the ganglion cyst limits your movement or if it gets infected. Treatment may include: Wearing a brace or splint on your wrist or finger. Taking anti-inflammatory medicine. Draining fluid from the lump with a needle (aspiration). Injecting a steroid into the joint. Surgery to remove the ganglion cyst. Follow these instructions at home: Do not press on the ganglion cyst, poke it with a needle, or hit it. Take medicines only as directed by your health care provider. Wear your brace or splint as directed by your health care provider. Watch your ganglion cyst for any changes. Keep all follow-up visits as directed by your health care provider. This is important. Contact a health care provider if: Your ganglion cyst becomes larger or more painful. You have increased redness, red streaks, or swelling. You have pus coming from the lump. You have weakness or numbness in the affected area. You have a fever or chills. This information is not intended to replace advice given to you by your health care provider. Make sure you discuss any questions you have with your health care provider. Document Released: 12/07/2000 Document Revised: 05/17/2016 Document Reviewed: 05/25/2014 Elsevier Interactive Patient Education  2017 [place] your health care provider about ways that you can be more active in your daily life. Medicine  Take over-the-counter and prescription medicines only as told by your health care provider.  Medicine may be prescribed by your health care provider to help lower cholesterol and decrease the risk for heart disease. This is usually done if diet and exercise have failed to bring down  cholesterol levels.  If you have several risk factors, you may need medicine even if your levels are normal. This information is not intended to replace advice given to you by your health care provider. Make sure you discuss any questions you have with your health care provider. Document Released: 09/04/2001 Document Revised: 07/07/2016 Document Reviewed: 06/09/2016 Elsevier Interactive Patient Education  2017 [employer].

## 2017-05-15 MED FILL — PARoxetine HCL 20 MG TABS: 20 | 90 days supply | Qty: 135 | Fill #0

## 2017-05-16 ENCOUNTER — Encounter: Payer: Self-pay | Admitting: Family Medicine

## 2017-05-16 DIAGNOSIS — Z79899 Other long term (current) drug therapy: Secondary | ICD-10-CM | POA: Diagnosis not present

## 2017-08-09 MED FILL — FLUTICASONE PROP 50 MCG SPR: 50 | 30 days supply | Qty: 16 | Fill #0

## 2017-08-10 ENCOUNTER — Encounter: Payer: Self-pay | Admitting: Family Medicine

## 2017-08-15 ENCOUNTER — Other Ambulatory Visit: Payer: Self-pay | Admitting: Family Medicine

## 2017-08-15 MED ORDER — VENLAFAXINE HCL 37.5 MG PO TABS: 37.5000 mg | ORAL_TABLET | Freq: Every day | ORAL | 0 refills | Status: DC

## 2017-08-15 MED ORDER — VENLAFAXINE HCL ER 75 MG PO CP24: 75.0000 mg | ORAL_CAPSULE | Freq: Every day | ORAL | 1 refills | Status: DC

## 2017-08-15 MED FILL — VENLAFAXINE HCL 37.5 MG TAB: 37.5 | 7 days supply | Qty: 7 | Fill #0

## 2017-08-15 MED FILL — VENLAFAXINE HCL ER 75 MG CA: 75 | 30 days supply | Qty: 30 | Fill #0

## 2017-08-20 ENCOUNTER — Other Ambulatory Visit: Payer: Self-pay | Admitting: Family Medicine

## 2017-08-20 DIAGNOSIS — Z1231 Encounter for screening mammogram for malignant neoplasm of breast: Secondary | ICD-10-CM

## 2017-08-22 ENCOUNTER — Encounter: Payer: Self-pay | Admitting: Family Medicine

## 2017-08-22 ENCOUNTER — Ambulatory Visit (INDEPENDENT_AMBULATORY_CARE_PROVIDER_SITE_OTHER): Payer: 59 | Admitting: Family Medicine

## 2017-08-22 DIAGNOSIS — D649 Anemia, unspecified: Secondary | ICD-10-CM | POA: Diagnosis not present

## 2017-08-22 DIAGNOSIS — F329 Major depressive disorder, single episode, unspecified: Secondary | ICD-10-CM | POA: Diagnosis not present

## 2017-08-22 DIAGNOSIS — F32A Depression, unspecified: Secondary | ICD-10-CM

## 2017-08-22 DIAGNOSIS — E782 Mixed hyperlipidemia: Secondary | ICD-10-CM

## 2017-08-22 DIAGNOSIS — L299 Pruritus, unspecified: Secondary | ICD-10-CM | POA: Diagnosis not present

## 2017-08-22 DIAGNOSIS — F419 Anxiety disorder, unspecified: Secondary | ICD-10-CM | POA: Diagnosis not present

## 2017-08-22 DIAGNOSIS — Z0000 Encounter for general adult medical examination without abnormal findings: Secondary | ICD-10-CM

## 2017-08-22 DIAGNOSIS — Z124 Encounter for screening for malignant neoplasm of cervix: Secondary | ICD-10-CM

## 2017-08-22 HISTORY — DX: Pruritus, unspecified: L29.9

## 2017-08-22 LAB — TSH: TSH: 1.98 u[IU]/mL (ref 0.35–4.50)

## 2017-08-22 LAB — CBC
HCT: 42.7 % (ref 36.0–46.0)
Hemoglobin: 14.4 g/dL (ref 12.0–15.0)
MCHC: 33.8 g/dL (ref 30.0–36.0)
MCV: 94.1 fl (ref 78.0–100.0)
Platelets: 334 10*3/uL (ref 150.0–400.0)
RBC: 4.54 Mil/uL (ref 3.87–5.11)
RDW: 12.9 % (ref 11.5–15.5)
WBC: 6.1 10*3/uL (ref 4.0–10.5)

## 2017-08-22 NOTE — Assessment & Plan Note (Signed)
Encouraged heart healthy diet, increase exercise, avoid trans fats, consider a krill oil cap daily 

## 2017-08-22 NOTE — Assessment & Plan Note (Signed)
Eyelids, add Claritin 10 mg qhs and avoid harsh chemicals

## 2017-08-22 NOTE — Patient Instructions (Addendum)
[email or web address]  Preventive Care 40-64 Years, Female Preventive care refers to lifestyle choices and visits with your health care provider that can promote health and wellness. What does preventive care include?  A yearly physical exam. This is also called an annual well check.  Dental exams once or twice a year.  Routine eye exams. Ask your health care provider how often you should have your eyes checked.  Personal lifestyle choices, including: ? Daily care of your teeth and gums. ? Regular physical activity. ? Eating a healthy diet. ? Avoiding tobacco and drug use. ? Limiting alcohol use. ? Practicing safe sex. ? Taking low-dose aspirin daily starting at age 71. ? Taking vitamin and mineral supplements as recommended by your health care provider. What happens during an annual well check? The services and screenings done by your health care provider during your annual well check will depend on your age, overall health, lifestyle risk factors, and family history of disease. Counseling Your health care provider may ask you questions about your:  Alcohol use.  Tobacco use.  Drug use.  Emotional well-being.  Home and relationship well-being.  Sexual activity.  Eating habits.  Work and work Statistician.  Method of birth control.  Menstrual cycle.  Pregnancy history.  Screening You may have the following tests or measurements:  Height, weight, and BMI.  Blood pressure.  Lipid and cholesterol levels. These may be checked every 5 years, or more frequently if you are over 24 years old.  Skin check.  Lung cancer screening. You may have this screening every year starting at age 34 if you have a 30-pack-year history of smoking and currently smoke or have quit within the past 15 years.  Fecal occult blood test (FOBT) of the stool. You may have this test every year starting at age 4.  Flexible sigmoidoscopy or colonoscopy. You may have a sigmoidoscopy every 5  years or a colonoscopy every 10 years starting at age 78.  Hepatitis C blood test.  Hepatitis B blood test.  Sexually transmitted disease (STD) testing.  Diabetes screening. This is done by checking your blood sugar (glucose) after you have not eaten for a while (fasting). You may have this done every 1-3 years.  Mammogram. This may be done every 1-2 years. Talk to your health care provider about when you should start having regular mammograms. This may depend on whether you have a family history of breast cancer.  BRCA-related cancer screening. This may be done if you have a family history of breast, ovarian, tubal, or peritoneal cancers.  Pelvic exam and Pap test. This may be done every 3 years starting at age 50. Starting at age 18, this may be done every 5 years if you have a Pap test in combination with an HPV test.  Bone density scan. This is done to screen for osteoporosis. You may have this scan if you are at high risk for osteoporosis.  Discuss your test results, treatment options, and if necessary, the need for more tests with your health care provider. Vaccines Your health care provider may recommend certain vaccines, such as:  Influenza vaccine. This is recommended every year.  Tetanus, diphtheria, and acellular pertussis (Tdap, Td) vaccine. You may need a Td booster every 10 years.  Varicella vaccine. You may need this if you have not been vaccinated.  Zoster vaccine. You may need this after age 57.  Measles, mumps, and rubella (MMR) vaccine. You may need at least one dose of MMR if you were  born in [birth year] or later. You may also need a second dose.  Pneumococcal 13-valent conjugate (PCV13) vaccine. You may need this if you have certain conditions and were not previously vaccinated.  Pneumococcal polysaccharide (PPSV23) vaccine. You may need one or two doses if you smoke cigarettes or if you have certain conditions.  Meningococcal vaccine. You may need this if you have  certain conditions.  Hepatitis A vaccine. You may need this if you have certain conditions or if you travel or work in places where you may be exposed to hepatitis A.  Hepatitis B vaccine. You may need this if you have certain conditions or if you travel or work in places where you may be exposed to hepatitis B.  Haemophilus influenzae type b (Hib) vaccine. You may need this if you have certain conditions.  Talk to your health care provider about which screenings and vaccines you need and how often you need them. This information is not intended to replace advice given to you by your health care provider. Make sure you discuss any questions you have with your health care provider. Document Released: 01/06/2016 Document Revised: 08/29/2016 Document Reviewed: 10/11/2015 Elsevier Interactive Patient Education  2017 [employer].

## 2017-08-22 NOTE — Assessment & Plan Note (Signed)
Increase leafy greens, consider increased lean red meat and using cast iron cookware. Continue to monitor, report any concerns. Check cbc  

## 2017-08-26 NOTE — Assessment & Plan Note (Signed)
Doing well and starting a new job soon.

## 2017-08-26 NOTE — Progress Notes (Signed)
Subjective:    Patient ID: Tricia Potts, female    DOB: [birth year], 46 y.o.   MRN: [id number]  Chief Complaint  Patient presents with  . Annual Exam    Patient is here today for a CPE. Mammogram is scheduled for next week.    HPI Patient is in today for annual preventative exam. She feels well. She has her mammogram is next week. She has no GYN concerns today. She starts a new job soon and is looking forward to it. She denies any recent febrile illness or hospitalizations. Denies CP/palp/SOB/HA/congestion/fevers/GI or GU c/o. Taking meds as prescribed  Past Medical History:  Diagnosis Date  . Allergy   . Anemia 06/18/2014  . Anxiety   . Anxiety and depression 02/17/2009   Qualifier: Diagnosis of  By: Redmond Pulling MD, Frann Rider    . Asthma   . Broken ankle 2011   (left) roller skating  . Bronchitis, mucopurulent recurrent ([place]) 04/21/2013  . Cervical cancer screening 02/20/2016  . Hemorrhoid   . History of hidradenitis suppurativa   . Hyperlipemia   . IBS (irritable bowel syndrome)   . Low back pain 05/25/2016  . PCO (polycystic ovaries)   . Pruritus 08/22/2017    Past Surgical History:  Procedure Laterality Date  . ANKLE SURGERY     plate and 8 screws in left ankle  . HEMORRHOID SURGERY      Family History  Problem Relation Age of Onset  . Ovarian cancer Unknown        Grandmother  . Arthritis Unknown   . Allergies Mother   . COPD Mother   . Heart disease Father        mitral valve disease/rupture during physical stress  . GER disease Father   . Irritable bowel syndrome Father   . Heart failure Father   . Hepatitis C Brother   . Cancer Brother 17       ALL  . Heart disease Daughter        asd s/p repair at age 61  . Anxiety disorder Daughter   . Cancer Maternal Grandmother        ovarian cancer  . Heart disease Maternal Grandfather        MI at 42  . Kidney disease Paternal Grandfather        possible kidney cancer  . Allergies Sister   . Eczema Sister   .  Cancer Sister 82       breast, DCIS  . Allergies Daughter     Social History   Social History  . Marital status: Married    Spouse name: N/A  . Number of children: N/A  . Years of education: N/A   Occupational History  . Not on file.   Social History Main Topics  . Smoking status: Never Smoker  . Smokeless tobacco: Never Used  . Alcohol use No     Comment: vegetarian  . Drug use: No  . Sexual activity: Yes    Partners: Male    Birth control/ protection: Other-see comments     Comment: vasectomy   Other Topics Concern  . Not on file   Social History Narrative  . No narrative on file    Outpatient Medications Prior to Visit  Medication Sig Dispense Refill  . ALPRAZolam (XANAX) 0.25 MG tablet Take 1 tablet (0.25 mg total) by mouth 2 (two) times daily as needed for anxiety. 40 tablet 1  . Flaxseed, Linseed, (FLAXSEED OIL PO) Take by  mouth.    . fluticasone (FLONASE) 50 MCG/ACT nasal spray PLACE 2 SPRAYS INTO THE NOSE DAILY 16 g 8  . Multiple Vitamin (MULTI-VITAMIN DAILY PO) Take by mouth daily.    Marland Kitchen OVER THE COUNTER MEDICATION OTC eye drops    . Probiotic Product (PRO-BIOTIC BLEND PO) Take by mouth daily.    Marland Kitchen venlafaxine (EFFEXOR) 37.5 MG tablet Take 1 tablet (37.5 mg total) by mouth daily. Take  for (7) seven days then stop 7 tablet 0  . venlafaxine XR (EFFEXOR XR) 75 MG 24 hr capsule Take 1 capsule (75 mg total) by mouth daily with breakfast. 30 capsule 1  . loratadine (CLARITIN) 10 MG tablet Take 10 mg by mouth daily.     No facility-administered medications prior to visit.     No Known Allergies  Review of Systems  Constitutional: Negative for fever and malaise/fatigue.  HENT: Negative for congestion.   Eyes: Negative for blurred vision.  Respiratory: Negative for shortness of breath.   Cardiovascular: Negative for chest pain, palpitations and leg swelling.  Gastrointestinal: Negative for abdominal pain, blood in stool and nausea.  Genitourinary: Negative  for dysuria and frequency.  Musculoskeletal: Negative for falls.  Skin: Negative for rash.  Neurological: Negative for dizziness, loss of consciousness and headaches.  Endo/Heme/Allergies: Negative for environmental allergies.  Psychiatric/Behavioral: Negative for depression. The patient is not nervous/anxious.        Objective:    Physical Exam  Constitutional: She is oriented to person, place, and time. She appears well-developed and well-nourished. No distress.  HENT:  Head: Normocephalic and atraumatic.  Nose: Nose normal.  Eyes: Right eye exhibits no discharge. Left eye exhibits no discharge.  Neck: Normal range of motion. Neck supple.  Cardiovascular: Normal rate and regular rhythm.   No murmur heard. Pulmonary/Chest: Effort normal and breath sounds normal.  Abdominal: Soft. Bowel sounds are normal. There is no tenderness.  Musculoskeletal: She exhibits no edema.  Neurological: She is alert and oriented to person, place, and time.  Skin: Skin is warm and dry.  Psychiatric: She has a normal mood and affect.  Nursing note and vitals reviewed.   BP 130/84 (BP Location: Right Arm, Patient Position: Sitting, Cuff Size: Normal)   Pulse 62   Temp 98.5 F (36.9 C) (Oral)   Ht 5' 1.5" (1.562 m)   Wt 161 lb 9.6 oz (73.3 kg)   LMP 07/25/2017   SpO2 98%   BMI 30.04 kg/m  Wt Readings from Last 3 Encounters:  08/22/17 161 lb 9.6 oz (73.3 kg)  05/14/17 156 lb 6.4 oz (70.9 kg)  08/20/16 156 lb (70.8 kg)     Lab Results  Component Value Date   WBC 6.1 08/22/2017   HGB 14.4 08/22/2017   HCT 42.7 08/22/2017   PLT 334.0 08/22/2017   GLUCOSE 90 05/10/2017   CHOL 232 (H) 05/10/2017   TRIG 147.0 05/10/2017   HDL 60.20 05/10/2017   LDLDIRECT 186.0 08/14/2016   LDLCALC 142 (H) 05/10/2017   ALT 14 05/10/2017   AST 17 05/10/2017   NA 139 05/10/2017   K 4.0 05/10/2017   CL 104 05/10/2017   CREATININE 0.78 05/10/2017   BUN 16 05/10/2017   CO2 27 05/10/2017   TSH 1.98  08/22/2017    Lab Results  Component Value Date   TSH 1.98 08/22/2017   Lab Results  Component Value Date   WBC 6.1 08/22/2017   HGB 14.4 08/22/2017   HCT 42.7 08/22/2017   MCV 94.1  08/22/2017   PLT 334.0 08/22/2017   Lab Results  Component Value Date   NA 139 05/10/2017   K 4.0 05/10/2017   CO2 27 05/10/2017   GLUCOSE 90 05/10/2017   BUN 16 05/10/2017   CREATININE 0.78 05/10/2017   BILITOT 0.6 05/10/2017   ALKPHOS 62 05/10/2017   AST 17 05/10/2017   ALT 14 05/10/2017   PROT 7.1 05/10/2017   ALBUMIN 4.2 05/10/2017   CALCIUM 9.4 05/10/2017   GFR 83.71 05/10/2017   Lab Results  Component Value Date   CHOL 232 (H) 05/10/2017   Lab Results  Component Value Date   HDL 60.20 05/10/2017   Lab Results  Component Value Date   LDLCALC 142 (H) 05/10/2017   Lab Results  Component Value Date   TRIG 147.0 05/10/2017   Lab Results  Component Value Date   CHOLHDL 4 05/10/2017   No results found for: HGBA1C     Assessment & Plan:   Problem List Items Addressed This Visit    Hyperlipidemia, mixed    Encouraged heart healthy diet, increase exercise, avoid trans fats, consider a krill oil cap daily      Relevant Orders   TSH (Completed)   Anxiety and depression    Doing well and starting a new job soon.       Preventative health care    Patient encouraged to maintain heart healthy diet, regular exercise, adequate sleep. Consider daily probiotics. Take medications as prescribed      Anemia    Increase leafy greens, consider increased lean red meat and using cast iron cookware. Continue to monitor, report any concerns. Check cbc      Relevant Orders   CBC (Completed)   RESOLVED: Pruritus    Eyelids, add Claritin 10 mg qhs and avoid harsh chemicals      Relevant Orders   TSH (Completed)      I have discontinued Ms. Dorothyann Gibbs loratadine. I am also having her maintain her OVER THE COUNTER MEDICATION, Multiple Vitamin (MULTI-VITAMIN DAILY PO), Probiotic  Product (PRO-BIOTIC BLEND PO), (Flaxseed, Linseed, (FLAXSEED OIL PO)), fluticasone, ALPRAZolam, venlafaxine, venlafaxine XR, and fexofenadine.  Meds ordered this encounter  Medications  . fexofenadine (ALLEGRA ALLERGY) 180 MG tablet    Sig: Take 1 tablet by mouth daily.     Penni Homans, MD

## 2017-08-26 NOTE — Assessment & Plan Note (Signed)
Patient encouraged to maintain heart healthy diet, regular exercise, adequate sleep. Consider daily probiotics. Take medications as prescribed 

## 2017-08-27 ENCOUNTER — Encounter (HOSPITAL_BASED_OUTPATIENT_CLINIC_OR_DEPARTMENT_OTHER): Payer: Self-pay

## 2017-08-27 ENCOUNTER — Ambulatory Visit (HOSPITAL_BASED_OUTPATIENT_CLINIC_OR_DEPARTMENT_OTHER)
Admission: RE | Admit: 2017-08-27 | Discharge: 2017-08-27 | Disposition: A | Discharge status: Home / Self Care | Payer: 59 | Source: Ambulatory Visit | Attending: Family Medicine | Admitting: Family Medicine

## 2017-08-27 DIAGNOSIS — Z1231 Encounter for screening mammogram for malignant neoplasm of breast: Secondary | ICD-10-CM | POA: Diagnosis not present

## 2017-09-17 ENCOUNTER — Ambulatory Visit: Payer: 59 | Admitting: Family Medicine

## 2017-09-23 MED FILL — VENLAFAXINE HCL ER 75 MG CA: 75 | 30 days supply | Qty: 30 | Fill #1

## 2017-10-23 MED FILL — FLUTICASONE PROP 50 MCG SPR: 50 | 30 days supply | Qty: 16 | Fill #1

## 2017-10-30 ENCOUNTER — Telehealth: Payer: Self-pay | Admitting: Family Medicine

## 2017-10-30 NOTE — Telephone Encounter (Signed)
Pt request refill VENLAFAXINE XR 75 MG sent to [facility].

## 2017-10-31 MED ORDER — VENLAFAXINE HCL ER 75 MG PO CP24: 75.0000 mg | ORAL_CAPSULE | Freq: Every day | ORAL | 1 refills | Status: DC

## 2017-10-31 MED FILL — VENLAFAXINE HCL ER 75 MG CA: 75 | 30 days supply | Qty: 30 | Fill #0

## 2017-10-31 NOTE — Telephone Encounter (Signed)
rx sent to pharmacy

## 2017-11-22 ENCOUNTER — Encounter: Payer: Self-pay | Admitting: Family Medicine

## 2017-11-22 ENCOUNTER — Ambulatory Visit (INDEPENDENT_AMBULATORY_CARE_PROVIDER_SITE_OTHER): Payer: 59 | Admitting: Family Medicine

## 2017-11-22 DIAGNOSIS — F329 Major depressive disorder, single episode, unspecified: Secondary | ICD-10-CM

## 2017-11-22 DIAGNOSIS — F419 Anxiety disorder, unspecified: Secondary | ICD-10-CM

## 2017-11-22 DIAGNOSIS — E782 Mixed hyperlipidemia: Secondary | ICD-10-CM

## 2017-11-22 DIAGNOSIS — J309 Allergic rhinitis, unspecified: Secondary | ICD-10-CM

## 2017-11-22 MED ORDER — VENLAFAXINE HCL ER 75 MG PO CP24: 75.0000 mg | ORAL_CAPSULE | Freq: Every day | ORAL | 1 refills | Status: DC

## 2017-11-22 MED ORDER — MONTELUKAST SODIUM 10 MG PO TABS: 10.0000 mg | ORAL_TABLET | Freq: Every evening | ORAL | 3 refills | Status: DC | PRN

## 2017-11-22 MED ORDER — AZELASTINE HCL 0.1 % NA SOLN: 1.0000 | Freq: Two times a day (BID) | NASAL | 3 refills | Status: DC | PRN

## 2017-11-22 NOTE — Patient Instructions (Signed)
[place] eye drops at [email or web address] Allergic Conjunctivitis A clear membrane (conjunctiva) covers the white part of your eye and the inner surface of your eyelid. Allergic conjunctivitis happens when this membrane has inflammation. This is caused by allergies. Common causes of allergic reactions (allergens)include:  Outdoor allergens, such as: ? Pollen. ? Grass and weeds. ? Mold spores.  Indoor allergens, such as: ? Dust. ? Smoke. ? Mold. ? Pet dander. ? Animal hair.  This condition can make your eye red or pink. It can also make your eye feel itchy. This condition cannot be spread from one person to another person (is not contagious). Follow these instructions at home:  Try not to be around things that you are allergic to.  Take or apply over-the-counter and prescription medicines only as told by your doctor. These include any eye drops.  Place a cool, clean washcloth on your eye for 10-20 minutes. Do this 3-4 times a day.  Do not touch or rub your eyes.  Do not wear contact lenses until the inflammation is gone. Wear glasses instead.  Do not wear eye makeup until the inflammation is gone.  Keep all follow-up visits as told by your doctor. This is important. Contact a doctor if:  Your symptoms get worse.  Your symptoms do not get better with treatment.  You have mild eye pain.  You are sensitive to light,  You have spots or blisters on your eyes.  You have pus coming from your eye.  You have a fever. Get help right away if:  You have redness, swelling, or other symptoms in only one eye.  Your vision is blurry.  You have vision changes.  You have very bad eye pain. Summary  Allergic conjunctivitis is caused by allergies. It can make your eye red or pink, and it can make your eye feel itchy.  This condition cannot be spread from one person to another person (is not contagious).  Try not to be around things that you are allergic to.  Take or apply  over-the-counter and prescription medicines only as told by your doctor. These include any eye drops.  Contact your doctor if your symptoms get worse or they do not get better with treatment. This information is not intended to replace advice given to you by your health care provider. Make sure you discuss any questions you have with your health care provider. Document Released: 05/30/2010 Document Revised: 08/03/2016 Document Reviewed: 08/03/2016 Elsevier Interactive Patient Education  2017 [employer].

## 2017-11-22 NOTE — Progress Notes (Signed)
Subjective:  I acted as a Education administrator for [employer]. Tricia Potts, Tricia Potts   Patient ID: Tricia Potts, female    DOB: Nov 07, 1971, 46 y.o.   MRN: [id number]  Chief Complaint  Patient presents with  . Follow-up    HPI  Patient is in today for 3 month follow up and overall she is doing well. She has recently started a new job with [employer]. She believes she will enjoy it but a new job is stressful. No recent febrile illness or hospitalizations. Her meds are helping her anxiety. Denies CP/palp/SOB/HAfevers/GI or GU c/o. Taking meds as prescribed. Is noting some head congestion and itchy, watery eyes.   Patient Care Team: Tricia Lukes, MD as PCP - General (Family Medicine) Tricia Koller Mora Appl, MD (Inactive) as Referring Physician (Internal Medicine)   Past Medical History:  Diagnosis Date  . Allergy   . Anemia 06/18/2014  . Anxiety   . Anxiety and depression 02/17/2009   Qualifier: Diagnosis of  By: Tricia Pulling MD, Frann Rider    . Asthma   . Broken ankle 2011   (left) roller skating  . Bronchitis, mucopurulent recurrent ([place]) 04/21/2013  . Cervical cancer screening 02/20/2016  . Hemorrhoid   . History of hidradenitis suppurativa   . Hyperlipemia   . IBS (irritable bowel syndrome)   . Low back pain 05/25/2016  . PCO (polycystic ovaries)   . Pruritus 08/22/2017    Past Surgical History:  Procedure Laterality Date  . ANKLE SURGERY     plate and 8 screws in left ankle  . HEMORRHOID SURGERY      Family History  Problem Relation Age of Onset  . Allergies Mother   . COPD Mother   . Heart disease Father        mitral valve disease/rupture during physical stress  . GER disease Father   . Irritable bowel syndrome Father   . Heart failure Father   . Hepatitis C Brother   . Cancer Brother 17       ALL  . Heart disease Daughter        asd s/p repair at age 24  . Anxiety disorder Daughter   . Cancer Maternal Grandmother        ovarian cancer  . Heart disease Maternal Grandfather    MI at 27  . Kidney disease Paternal Grandfather        possible kidney cancer  . Allergies Sister   . Eczema Sister   . Cancer Sister 8       breast, DCIS  . Allergies Daughter   . Ovarian cancer Unknown        Grandmother  . Arthritis Unknown     Social History   Socioeconomic History  . Marital status: Married    Spouse name: Not on file  . Number of children: Not on file  . Years of education: Not on file  . Highest education level: Not on file  Social Needs  . Financial resource strain: Not on file  . Food insecurity - worry: Not on file  . Food insecurity - inability: Not on file  . Transportation needs - medical: Not on file  . Transportation needs - non-medical: Not on file  Occupational History  . Not on file  Tobacco Use  . Smoking status: Never Smoker  . Smokeless tobacco: Never Used  Substance and Sexual Activity  . Alcohol use: No    Comment: vegetarian  . Drug use: No  . Sexual  activity: Yes    Partners: Male    Birth control/protection: Other-see comments    Comment: vasectomy  Other Topics Concern  . Not on file  Social History Narrative  . Not on file    Outpatient Medications Prior to Visit  Medication Sig Dispense Refill  . ALPRAZolam (XANAX) 0.25 MG tablet Take 1 tablet (0.25 mg total) by mouth 2 (two) times daily as needed for anxiety. 40 tablet 1  . fexofenadine (ALLEGRA ALLERGY) 180 MG tablet Take 1 tablet by mouth daily.    . Flaxseed, Linseed, (FLAXSEED OIL PO) Take by mouth.    . fluticasone (FLONASE) 50 MCG/ACT nasal spray PLACE 2 SPRAYS INTO THE NOSE DAILY 16 g 8  . Multiple Vitamin (MULTI-VITAMIN DAILY PO) Take by mouth daily.    Marland Kitchen OVER THE COUNTER MEDICATION OTC eye drops    . Probiotic Product (PRO-BIOTIC BLEND PO) Take by mouth daily.    Marland Kitchen venlafaxine (EFFEXOR) 37.5 MG tablet Take 1 tablet (37.5 mg total) by mouth daily. Take  for (7) seven days then stop 7 tablet 0  . venlafaxine XR (EFFEXOR XR) 75 MG 24 hr capsule Take 1  capsule (75 mg total) daily with breakfast by mouth. 30 capsule 1   No facility-administered medications prior to visit.     No Known Allergies  Review of Systems  Constitutional: Negative for fever and malaise/fatigue.  HENT: Positive for congestion.   Eyes: Positive for discharge. Negative for blurred vision.  Respiratory: Negative for shortness of breath.   Cardiovascular: Negative for chest pain, palpitations and leg swelling.  Gastrointestinal: Negative for abdominal pain, blood in stool and nausea.  Genitourinary: Negative for dysuria and frequency.  Musculoskeletal: Negative for falls.  Skin: Negative for rash.  Neurological: Negative for dizziness, loss of consciousness and headaches.  Endo/Heme/Allergies: Negative for environmental allergies.  Psychiatric/Behavioral: Negative for depression. The patient is not nervous/anxious.        Objective:    Physical Exam  Constitutional: She is oriented to person, place, and time. She appears well-developed and well-nourished. No distress.  HENT:  Head: Normocephalic and atraumatic.  Nose: Nose normal.  Nasal mucosa boggy and erythematous  Eyes: Right eye exhibits no discharge. Left eye exhibits no discharge.  Neck: Normal range of motion. Neck supple.  Cardiovascular: Normal rate and regular rhythm.  No murmur heard. Pulmonary/Chest: Effort normal and breath sounds normal.  Abdominal: Soft. Bowel sounds are normal. There is no tenderness.  Musculoskeletal: She exhibits no edema.  Neurological: She is alert and oriented to person, place, and time.  Skin: Skin is warm and dry.  Psychiatric: She has a normal mood and affect.  Nursing note and vitals reviewed.   BP 138/60 (BP Location: Left Arm, Patient Position: Sitting, Cuff Size: Normal)   Pulse 67   Temp (!) 97.4 F (36.3 C) (Oral)   Resp 16   Ht 5' 1.42" (1.56 m)   Wt 159 lb 6.4 oz (72.3 kg)   SpO2 98%   BMI 29.71 kg/m  Wt Readings from Last 3 Encounters:    11/22/17 159 lb 6.4 oz (72.3 kg)  08/22/17 161 lb 9.6 oz (73.3 kg)  05/14/17 156 lb 6.4 oz (70.9 kg)   BP Readings from Last 3 Encounters:  11/22/17 138/60  08/22/17 130/84  05/14/17 110/68     Immunization History  Administered Date(s) Administered  . Influenza Whole 09/23/2012, 09/30/2013  . Influenza-Unspecified 09/23/2014, 09/26/2015  . Pneumococcal Conjugate-13 10/25/2014  . Td 03/07/2010  . Tdap  06/21/2014    Health Maintenance  Topic Date Due  . HIV Screening  02/27/1984  . MAMMOGRAM  08/27/2018  . PAP SMEAR  02/19/2019  . TETANUS/TDAP  06/21/2024  . INFLUENZA VACCINE  Completed    Lab Results  Component Value Date   WBC 6.1 08/22/2017   HGB 14.4 08/22/2017   HCT 42.7 08/22/2017   PLT 334.0 08/22/2017   GLUCOSE 90 05/10/2017   CHOL 232 (H) 05/10/2017   TRIG 147.0 05/10/2017   HDL 60.20 05/10/2017   LDLDIRECT 186.0 08/14/2016   LDLCALC 142 (H) 05/10/2017   ALT 14 05/10/2017   AST 17 05/10/2017   NA 139 05/10/2017   K 4.0 05/10/2017   CL 104 05/10/2017   CREATININE 0.78 05/10/2017   BUN 16 05/10/2017   CO2 27 05/10/2017   TSH 1.98 08/22/2017    Lab Results  Component Value Date   TSH 1.98 08/22/2017   Lab Results  Component Value Date   WBC 6.1 08/22/2017   HGB 14.4 08/22/2017   HCT 42.7 08/22/2017   MCV 94.1 08/22/2017   PLT 334.0 08/22/2017   Lab Results  Component Value Date   NA 139 05/10/2017   K 4.0 05/10/2017   CO2 27 05/10/2017   GLUCOSE 90 05/10/2017   BUN 16 05/10/2017   CREATININE 0.78 05/10/2017   BILITOT 0.6 05/10/2017   ALKPHOS 62 05/10/2017   AST 17 05/10/2017   ALT 14 05/10/2017   PROT 7.1 05/10/2017   ALBUMIN 4.2 05/10/2017   CALCIUM 9.4 05/10/2017   GFR 83.71 05/10/2017   Lab Results  Component Value Date   CHOL 232 (H) 05/10/2017   Lab Results  Component Value Date   HDL 60.20 05/10/2017   Lab Results  Component Value Date   LDLCALC 142 (H) 05/10/2017   Lab Results  Component Value Date   TRIG 147.0  05/10/2017   Lab Results  Component Value Date   CHOLHDL 4 05/10/2017   No results found for: HGBA1C       Assessment & Plan:   Problem List Items Addressed This Visit    Hyperlipidemia, mixed    Encouraged heart healthy diet, increase exercise, avoid trans fats, consider a krill oil cap daily. Has improved in this past year. Will continue to monitor      Anxiety and depression    Has recently started a new job so is stressed but believes her med is helping.       Relevant Medications   venlafaxine XR (EFFEXOR XR) 75 MG 24 hr capsule   Allergic rhinitis    And conjunctivitis, add Astelin and Singulair as needed. Continue other meds.         I have discontinued [place] venlafaxine. I have also changed her venlafaxine XR. Additionally, I am having her start on montelukast and azelastine. Lastly, I am having her maintain her OVER THE COUNTER MEDICATION, Multiple Vitamin (MULTI-VITAMIN DAILY PO), Probiotic Product (PRO-BIOTIC BLEND PO), (Flaxseed, Linseed, (FLAXSEED OIL PO)), fluticasone, ALPRAZolam, and fexofenadine.  Meds ordered this encounter  Medications  . venlafaxine XR (EFFEXOR XR) 75 MG 24 hr capsule    Sig: Take 1 capsule (75 mg total) by mouth daily with breakfast.    Dispense:  90 capsule    Refill:  1  . montelukast (SINGULAIR) 10 MG tablet    Sig: Take 1 tablet (10 mg total) by mouth at bedtime as needed.    Dispense:  30 tablet    Refill:  3  . azelastine (ASTELIN) 0.1 % nasal spray    Sig: Place 1-2 sprays into both nostrils 2 (two) times daily as needed for rhinitis. Use in each nostril as directed    Dispense:  30 mL    Refill:  3    CMA served as scribe during this visit. History, Physical and Plan performed by medical provider. Documentation and orders reviewed and attested to.  Penni Homans, MD

## 2017-11-22 NOTE — Assessment & Plan Note (Signed)
Encouraged heart healthy diet, increase exercise, avoid trans fats, consider a krill oil cap daily. Has improved in this past year. Will continue to monitor

## 2017-11-24 NOTE — Assessment & Plan Note (Signed)
>>  ASSESSMENT AND PLAN FOR OTHER ALLERGIC RHINITIS WRITTEN ON 11/24/2017  6:48 PM BY BLYTH, STACEY A, MD  And conjunctivitis, add Astelin and Singulair as needed. Continue other meds.

## 2017-11-24 NOTE — Assessment & Plan Note (Signed)
And conjunctivitis, add Astelin and Singulair as needed. Continue other meds.

## 2017-11-24 NOTE — Assessment & Plan Note (Signed)
Has recently started a new job so is stressed but believes her med is helping.

## 2017-11-27 MED FILL — VENLAFAXINE HCL ER 75 MG CA: 75 | 90 days supply | Qty: 90 | Fill #0

## 2017-11-27 MED FILL — MONTELUKAST SOD 10 MG TAB: 10 | 30 days supply | Qty: 30 | Fill #0

## 2017-12-11 ENCOUNTER — Telehealth: Payer: Self-pay | Admitting: Family Medicine

## 2017-12-11 NOTE — Telephone Encounter (Signed)
Copied from [place] [id number]. Topic: Quick Communication - See Telephone Encounter >> Dec 11, 2017 10:06 AM Conception Chancy, NT wrote: CRM for notification. See Telephone encounter for:  12/11/17.  Patient is calling to get a refill on Xanax. Pharmacy is the [facility] that is on file

## 2017-12-11 NOTE — Telephone Encounter (Signed)
She can have a refill on the xanax with same sig, same numbers with 1 rf.

## 2017-12-11 NOTE — Telephone Encounter (Signed)
Last RX: 05/14/17, #40 x 1 refill Last OV: 11/22/17 Next OV: 05/27/18 UDS: 05/16/17 low risk CSC: 05/14/17 CSR: Reviewed.  Last rx in database dated 10/28/15 but it looks like pt was given written rx at [facility] on 05/14/17.  Please advise?

## 2017-12-12 ENCOUNTER — Other Ambulatory Visit: Payer: Self-pay

## 2017-12-12 MED ORDER — ALPRAZOLAM 0.25 MG PO TABS: 0.2500 mg | ORAL_TABLET | Freq: Two times a day (BID) | ORAL | 1 refills | Status: DC | PRN

## 2017-12-12 NOTE — Telephone Encounter (Signed)
rx printed and faxed

## 2017-12-20 MED FILL — ALPRAZolam 0.25 MG TABS: 0.25 | 20 days supply | Qty: 40 | Fill #0

## 2017-12-25 MED FILL — FLUTICASONE PROP 50 MCG SPR: 50 | 30 days supply | Qty: 16 | Fill #2

## 2017-12-30 MED FILL — MONTELUKAST SOD 10 MG TAB: 10 | 30 days supply | Qty: 30 | Fill #1

## 2018-02-08 ENCOUNTER — Encounter: Payer: Self-pay | Admitting: Family Medicine

## 2018-02-17 MED FILL — VENLAFAXINE HCL ER 75 MG CA: 75 | 30 days supply | Qty: 30 | Fill #1

## 2018-02-17 MED FILL — MONTELUKAST SOD 10 MG TAB: 10 | 30 days supply | Qty: 30 | Fill #2

## 2018-02-17 MED FILL — FLUTICASONE PROP 50 MCG SPR: 50 | 30 days supply | Qty: 16 | Fill #3

## 2018-04-03 MED FILL — VENLAFAXINE HCL ER 75 MG CA: 75 | 30 days supply | Qty: 30 | Fill #2

## 2018-05-05 MED FILL — VENLAFAXINE HCL ER 75 MG CA: 75 | 30 days supply | Qty: 30 | Fill #3

## 2018-05-21 ENCOUNTER — Telehealth: Payer: Self-pay | Admitting: Family Medicine

## 2018-05-21 NOTE — Telephone Encounter (Signed)
Copied from [place] [id number]. Topic: Quick Communication - See Telephone Encounter >> May 21, 2018 10:30 AM Genella Rife H wrote: CRM for notification. See Telephone encounter for: 05/21/18.  Left message appt needs to be rescheduled per pcp.

## 2018-05-27 ENCOUNTER — Encounter: Payer: 59 | Admitting: Family Medicine

## 2018-06-09 MED FILL — VENLAFAXINE HCL ER 75 MG CA: 75 | 30 days supply | Qty: 30 | Fill #1

## 2018-06-24 ENCOUNTER — Encounter: Payer: Self-pay | Admitting: Family Medicine

## 2018-06-25 MED ORDER — FLUTICASONE PROPIONATE 50 MCG/ACT NA SUSP: 2.0000 | Freq: Every day | NASAL | 5 refills | Status: DC

## 2018-06-25 MED FILL — FLUTICASONE PROP 50 MCG SPR: 50 | 30 days supply | Qty: 16 | Fill #0 | Status: TO

## 2018-07-01 ENCOUNTER — Other Ambulatory Visit: Payer: Self-pay | Admitting: Family Medicine

## 2018-07-09 ENCOUNTER — Encounter: Payer: Self-pay | Admitting: Family Medicine

## 2018-07-10 ENCOUNTER — Other Ambulatory Visit: Payer: Self-pay | Admitting: Family Medicine

## 2018-07-10 MED ORDER — VENLAFAXINE HCL ER 75 MG PO CP24: 75.0000 mg | ORAL_CAPSULE | Freq: Every day | ORAL | 0 refills | Status: DC

## 2018-09-04 MED FILL — FLUTICASONE PROP 50 MCG SPR: 50 | 30 days supply | Qty: 16 | Fill #0

## 2018-09-19 ENCOUNTER — Ambulatory Visit (INDEPENDENT_AMBULATORY_CARE_PROVIDER_SITE_OTHER): Payer: No Typology Code available for payment source | Admitting: Family Medicine

## 2018-09-19 ENCOUNTER — Encounter: Payer: Self-pay | Admitting: Family Medicine

## 2018-09-19 VITALS — BP 118/70 | HR 68 | Temp 98.4°F | Resp 18 | Ht 61.5 in | Wt 156.0 lb

## 2018-09-19 DIAGNOSIS — F329 Major depressive disorder, single episode, unspecified: Secondary | ICD-10-CM | POA: Diagnosis not present

## 2018-09-19 DIAGNOSIS — Z0000 Encounter for general adult medical examination without abnormal findings: Secondary | ICD-10-CM

## 2018-09-19 DIAGNOSIS — Z23 Encounter for immunization: Secondary | ICD-10-CM

## 2018-09-19 DIAGNOSIS — F419 Anxiety disorder, unspecified: Secondary | ICD-10-CM | POA: Diagnosis not present

## 2018-09-19 DIAGNOSIS — F32A Depression, unspecified: Secondary | ICD-10-CM

## 2018-09-19 DIAGNOSIS — E782 Mixed hyperlipidemia: Secondary | ICD-10-CM | POA: Diagnosis not present

## 2018-09-19 LAB — COMPREHENSIVE METABOLIC PANEL
ALT: 13 U/L (ref 0–35)
AST: 14 U/L (ref 0–37)
Albumin: 4.2 g/dL (ref 3.5–5.2)
Alkaline Phosphatase: 61 U/L (ref 39–117)
BUN: 12 mg/dL (ref 6–23)
CALCIUM: 9.1 mg/dL (ref 8.4–10.5)
CO2: 26 meq/L (ref 19–32)
Chloride: 104 mEq/L (ref 96–112)
Creatinine, Ser: 0.67 mg/dL (ref 0.40–1.20)
GFR: 99.2 mL/min (ref >60.00)
Glucose, Bld: 80 mg/dL (ref 70–99)
Potassium: 4.1 mEq/L (ref 3.5–5.1)
Sodium: 138 mEq/L (ref 135–145)
Total Bilirubin: 0.4 mg/dL (ref 0.2–1.2)
Total Protein: 7.1 g/dL (ref 6.0–8.3)

## 2018-09-19 LAB — LIPID PANEL
CHOL/HDL RATIO: 4
Cholesterol: 247 mg/dL — ABNORMAL HIGH (ref 0–200)
HDL: 60.9 mg/dL (ref >39.00)
LDL CALC: 154 mg/dL — AB (ref 0–99)
NonHDL: 186.55
TRIGLYCERIDES: 165 mg/dL — AB (ref 0.0–149.0)
VLDL: 33 mg/dL (ref 0.0–40.0)

## 2018-09-19 LAB — CBC
HCT: 40.5 % (ref 36.0–46.0)
HEMOGLOBIN: 13.9 g/dL (ref 12.0–15.0)
MCHC: 34.2 g/dL (ref 30.0–36.0)
MCV: 91.7 fl (ref 78.0–100.0)
PLATELETS: 318 10*3/uL (ref 150.0–400.0)
RBC: 4.42 Mil/uL (ref 3.87–5.11)
RDW: 12.7 % (ref 11.5–15.5)
WBC: 4.8 10*3/uL (ref 4.0–10.5)

## 2018-09-19 LAB — TSH: TSH: 2.98 u[IU]/mL (ref 0.35–4.50)

## 2018-09-19 NOTE — Progress Notes (Signed)
Subjective:    Patient ID: Tricia Potts, female    DOB: 15-Aug-1971, 47 y.o.   MRN: [id number]  No chief complaint on file.   HPI Patient is in today for annual preventative exam and chronic medical concerns including hyperlipidemia, anxiety/depression and overall she is doing well. The Effexor is working well and her job is much better. She thinks she has only had to use Alprazolam once since she was here last. She stays active and is doing a good job of keeping her stress in check. Is doing well with activities of daily living. Denies CP/palp/SOB/HA/congestion/fevers/GI or GU c/o. Taking meds as prescribed  Past Medical History:  Diagnosis Date  . Allergy   . Anemia 06/18/2014  . Anxiety   . Anxiety and depression 02/17/2009   Qualifier: Diagnosis of  By: Redmond Pulling MD, Frann Rider    . Asthma   . Broken ankle 2011   (left) roller skating  . Bronchitis, mucopurulent recurrent ([place]) 04/21/2013  . Cervical cancer screening 02/20/2016  . Hemorrhoid   . History of hidradenitis suppurativa   . Hyperlipemia   . IBS (irritable bowel syndrome)   . Low back pain 05/25/2016  . PCO (polycystic ovaries)   . Pruritus 08/22/2017    Past Surgical History:  Procedure Laterality Date  . ANKLE SURGERY     plate and 8 screws in left ankle  . HEMORRHOID SURGERY      Family History  Problem Relation Age of Onset  . Allergies Mother   . COPD Mother   . Heart disease Father        mitral valve disease/rupture during physical stress  . GER disease Father   . Irritable bowel syndrome Father   . Heart failure Father   . Hepatitis C Brother   . Cancer Brother 17       ALL  . Heart disease Daughter        asd s/p repair at age 64  . Anxiety disorder Daughter   . Cancer Maternal Grandmother        ovarian cancer  . Heart disease Maternal Grandfather        MI at 81  . Kidney disease Paternal Grandfather        possible kidney cancer  . Allergies Sister   . Eczema Sister   . Cancer Sister 16     breast, DCIS  . Allergies Daughter   . Ovarian cancer Unknown        Grandmother  . Arthritis Unknown     Social History   Socioeconomic History  . Marital status: Married    Spouse name: Not on file  . Number of children: Not on file  . Years of education: Not on file  . Highest education level: Not on file  Occupational History  . Not on file  Social Needs  . Financial resource strain: Not on file  . Food insecurity:    Worry: Not on file    Inability: Not on file  . Transportation needs:    Medical: Not on file    Non-medical: Not on file  Tobacco Use  . Smoking status: Never Smoker  . Smokeless tobacco: Never Used  Substance and Sexual Activity  . Alcohol use: No    Comment: vegetarian  . Drug use: No  . Sexual activity: Yes    Partners: Male    Birth control/protection: Other-see comments    Comment: vasectomy  Lifestyle  . Physical activity:  Days per week: Not on file    Minutes per session: Not on file  . Stress: Not on file  Relationships  . Social connections:    Talks on phone: Not on file    Gets together: Not on file    Attends religious service: Not on file    Active member of club or organization: Not on file    Attends meetings of clubs or organizations: Not on file    Relationship status: Not on file  . Intimate partner violence:    Fear of current or ex partner: Not on file    Emotionally abused: Not on file    Physically abused: Not on file    Forced sexual activity: Not on file  Other Topics Concern  . Not on file  Social History Narrative  . Not on file    Outpatient Medications Prior to Visit  Medication Sig Dispense Refill  . ALPRAZolam (XANAX) 0.25 MG tablet Take 1 tablet (0.25 mg total) by mouth 2 (two) times daily as needed for anxiety. 40 tablet 1  . fluticasone (FLONASE) 50 MCG/ACT nasal spray PLACE 2 SPRAYS INTO THE NOSE DAILY 16 g 8  . montelukast (SINGULAIR) 10 MG tablet Take 1 tablet (10 mg total) by mouth at bedtime  as needed. 30 tablet 3  . Multiple Vitamin (MULTI-VITAMIN DAILY PO) Take by mouth daily.    Marland Kitchen OVER THE COUNTER MEDICATION OTC eye drops    . Probiotic Product (PRO-BIOTIC BLEND PO) Take by mouth daily.    Marland Kitchen venlafaxine XR (EFFEXOR XR) 75 MG 24 hr capsule Take 1 capsule (75 mg total) by mouth daily with breakfast. 90 capsule 0  . fexofenadine (ALLEGRA ALLERGY) 180 MG tablet Take 1 tablet by mouth daily.    Marland Kitchen azelastine (ASTELIN) 0.1 % nasal spray Place 1-2 sprays into both nostrils 2 (two) times daily as needed for rhinitis. Use in each nostril as directed 30 mL 3  . Flaxseed, Linseed, (FLAXSEED OIL PO) Take by mouth.     No facility-administered medications prior to visit.     No Known Allergies  Review of Systems  Constitutional: Negative for chills, fever and malaise/fatigue.  HENT: Negative for congestion and hearing loss.   Eyes: Negative for discharge.  Respiratory: Negative for cough, sputum production and shortness of breath.   Cardiovascular: Negative for chest pain, palpitations and leg swelling.  Gastrointestinal: Negative for abdominal pain, blood in stool, constipation, diarrhea, heartburn, nausea and vomiting.  Genitourinary: Negative for dysuria, frequency, hematuria and urgency.  Musculoskeletal: Negative for back pain, falls and myalgias.  Skin: Negative for rash.  Neurological: Negative for dizziness, sensory change, loss of consciousness, weakness and headaches.  Endo/Heme/Allergies: Negative for environmental allergies. Does not bruise/bleed easily.  Psychiatric/Behavioral: Negative for depression and suicidal ideas. The patient is not nervous/anxious and does not have insomnia.        Objective:    Physical Exam  Constitutional: She is oriented to person, place, and time. She appears well-developed and well-nourished. No distress.  HENT:  Head: Normocephalic and atraumatic.  Eyes: Conjunctivae are normal.  Neck: Neck supple. No thyromegaly present.    Cardiovascular: Normal rate, regular rhythm and normal heart sounds.  No murmur heard. Pulmonary/Chest: Effort normal and breath sounds normal. No respiratory distress.  Abdominal: Soft. Bowel sounds are normal. She exhibits no distension and no mass. There is no tenderness.  Musculoskeletal: She exhibits no edema.  Lymphadenopathy:    She has no cervical adenopathy.  Neurological: She  is alert and oriented to person, place, and time.  Skin: Skin is warm and dry.  Psychiatric: She has a normal mood and affect. Her behavior is normal.    BP 118/70 (BP Location: Left Arm, Patient Position: Sitting, Cuff Size: Normal)   Pulse 68   Temp 98.4 F (36.9 C) (Oral)   Resp 18   Ht 5' 1.5" (1.562 m)   Wt 156 lb (70.8 kg)   LMP 09/13/2018   SpO2 98%   BMI 29.00 kg/m  Wt Readings from Last 3 Encounters:  09/19/18 156 lb (70.8 kg)  11/22/17 159 lb 6.4 oz (72.3 kg)  08/22/17 161 lb 9.6 oz (73.3 kg)     Lab Results  Component Value Date   WBC 6.1 08/22/2017   HGB 14.4 08/22/2017   HCT 42.7 08/22/2017   PLT 334.0 08/22/2017   GLUCOSE 90 05/10/2017   CHOL 232 (H) 05/10/2017   TRIG 147.0 05/10/2017   HDL 60.20 05/10/2017   LDLDIRECT 186.0 08/14/2016   LDLCALC 142 (H) 05/10/2017   ALT 14 05/10/2017   AST 17 05/10/2017   NA 139 05/10/2017   K 4.0 05/10/2017   CL 104 05/10/2017   CREATININE 0.78 05/10/2017   BUN 16 05/10/2017   CO2 27 05/10/2017   TSH 1.98 08/22/2017    Lab Results  Component Value Date   TSH 1.98 08/22/2017   Lab Results  Component Value Date   WBC 6.1 08/22/2017   HGB 14.4 08/22/2017   HCT 42.7 08/22/2017   MCV 94.1 08/22/2017   PLT 334.0 08/22/2017   Lab Results  Component Value Date   NA 139 05/10/2017   K 4.0 05/10/2017   CO2 27 05/10/2017   GLUCOSE 90 05/10/2017   BUN 16 05/10/2017   CREATININE 0.78 05/10/2017   BILITOT 0.6 05/10/2017   ALKPHOS 62 05/10/2017   AST 17 05/10/2017   ALT 14 05/10/2017   PROT 7.1 05/10/2017   ALBUMIN 4.2  05/10/2017   CALCIUM 9.4 05/10/2017   GFR 83.71 05/10/2017   Lab Results  Component Value Date   CHOL 232 (H) 05/10/2017   Lab Results  Component Value Date   HDL 60.20 05/10/2017   Lab Results  Component Value Date   LDLCALC 142 (H) 05/10/2017   Lab Results  Component Value Date   TRIG 147.0 05/10/2017   Lab Results  Component Value Date   CHOLHDL 4 05/10/2017   No results found for: HGBA1C     Assessment & Plan:   Problem List Items Addressed This Visit    Hyperlipidemia, mixed    Encouraged heart healthy diet, increase exercise, avoid trans fats, consider a krill oil cap daily      Relevant Orders   Lipid panel   Anxiety and depression    Doing well with Effexor no changes needed.       Preventative health care    Patient encouraged to maintain heart healthy diet, regular exercise, adequate sleep. Consider daily probiotics. Take medications as prescribed      Relevant Orders   CBC   Comprehensive metabolic panel   TSH      I have discontinued Dior Dominik. Kontos's (Flaxseed, Linseed, (FLAXSEED OIL PO)), fexofenadine, and azelastine. I am also having her maintain her OVER THE COUNTER MEDICATION, Multiple Vitamin (MULTI-VITAMIN DAILY PO), Probiotic Product (PRO-BIOTIC BLEND PO), montelukast, ALPRAZolam, fluticasone, and venlafaxine XR.  No orders of the defined types were placed in this encounter.    Penni Homans, MD

## 2018-09-19 NOTE — Assessment & Plan Note (Signed)
Doing well with Effexor no changes needed.

## 2018-09-19 NOTE — Patient Instructions (Signed)
Shingrix is the new shingles shot, 2 shots over 2 to 6 month at office or at [place] [facility], Female Preventive care refers to lifestyle choices and visits with your health care provider that can promote health and wellness. What does preventive care include?  A yearly physical exam. This is also called an annual well check.  Dental exams once or twice a year.  Routine eye exams. Ask your health care provider how often you should have your eyes checked.  Personal lifestyle choices, including: ? Daily care of your teeth and gums. ? Regular physical activity. ? Eating a healthy diet. ? Avoiding tobacco and drug use. ? Limiting alcohol use. ? Practicing safe sex. ? Taking low-dose aspirin daily starting at age 52. ? Taking vitamin and mineral supplements as recommended by your health care provider. What happens during an annual well check? The services and screenings done by your health care provider during your annual well check will depend on your age, overall health, lifestyle risk factors, and family history of disease. Counseling Your health care provider may ask you questions about your:  Alcohol use.  Tobacco use.  Drug use.  Emotional well-being.  Home and relationship well-being.  Sexual activity.  Eating habits.  Work and work Statistician.  Method of birth control.  Menstrual cycle.  Pregnancy history.  Screening You may have the following tests or measurements:  Height, weight, and BMI.  Blood pressure.  Lipid and cholesterol levels. These may be checked every 5 years, or more frequently if you are over 17 years old.  Skin check.  Lung cancer screening. You may have this screening every year starting at age 52 if you have a 30-pack-year history of smoking and currently smoke or have quit within the past 15 years.  Fecal occult blood test (FOBT) of the stool. You may have this test every year starting at age 15.  Flexible  sigmoidoscopy or colonoscopy. You may have a sigmoidoscopy every 5 years or a colonoscopy every 10 years starting at age 48.  Hepatitis C blood test.  Hepatitis B blood test.  Sexually transmitted disease (STD) testing.  Diabetes screening. This is done by checking your blood sugar (glucose) after you have not eaten for a while (fasting). You may have this done every 1-3 years.  Mammogram. This may be done every 1-2 years. Talk to your health care provider about when you should start having regular mammograms. This may depend on whether you have a family history of breast cancer.  BRCA-related cancer screening. This may be done if you have a family history of breast, ovarian, tubal, or peritoneal cancers.  Pelvic exam and Pap test. This may be done every 3 years starting at age 27. Starting at age 22, this may be done every 5 years if you have a Pap test in combination with an HPV test.  Bone density scan. This is done to screen for osteoporosis. You may have this scan if you are at high risk for osteoporosis.  Discuss your test results, treatment options, and if necessary, the need for more tests with your health care provider. Vaccines Your health care provider may recommend certain vaccines, such as:  Influenza vaccine. This is recommended every year.  Tetanus, diphtheria, and acellular pertussis (Tdap, Td) vaccine. You may need a Td booster every 10 years.  Varicella vaccine. You may need this if you have not been vaccinated.  Zoster vaccine. You may need this after age 37.  Measles, mumps,  and rubella (MMR) vaccine. You may need at least one dose of MMR if you were born in 1957 or later. You may also need a second dose.  Pneumococcal 13-valent conjugate (PCV13) vaccine. You may need this if you have certain conditions and were not previously vaccinated.  Pneumococcal polysaccharide (PPSV23) vaccine. You may need one or two doses if you smoke cigarettes or if you have certain  conditions.  Meningococcal vaccine. You may need this if you have certain conditions.  Hepatitis A vaccine. You may need this if you have certain conditions or if you travel or work in places where you may be exposed to hepatitis A.  Hepatitis B vaccine. You may need this if you have certain conditions or if you travel or work in places where you may be exposed to hepatitis B.  Haemophilus influenzae type b (Hib) vaccine. You may need this if you have certain conditions.  Talk to your health care provider about which screenings and vaccines you need and how often you need them. This information is not intended to replace advice given to you by your health care provider. Make sure you discuss any questions you have with your health care provider. Document Released: 01/06/2016 Document Revised: 08/29/2016 Document Reviewed: 10/11/2015 Elsevier Interactive Patient Education  [employer].

## 2018-09-19 NOTE — Assessment & Plan Note (Signed)
Patient encouraged to maintain heart healthy diet, regular exercise, adequate sleep. Consider daily probiotics. Take medications as prescribed 

## 2018-09-19 NOTE — Progress Notes (Deleted)
Subjective:  I acted as a Education administrator for Dr. Charlett Blake. Tricia Potts, [place]  Patient ID: Tricia Potts, female    DOB: 10/10/1971, 47 y.o.   MRN: [id number]  No chief complaint on file.   HPI  Patient is in today for an annual exam.   Patient Care Team: Mosie Lukes, MD as PCP - General (Family Medicine) Elizebeth Koller Mora Appl, MD (Inactive) as Referring Physician (Internal Medicine)   Past Medical History:  Diagnosis Date  . Allergy   . Anemia 06/18/2014  . Anxiety   . Anxiety and depression 02/17/2009   Qualifier: Diagnosis of  By: Redmond Pulling MD, Frann Rider    . Asthma   . Broken ankle 2011   (left) roller skating  . Bronchitis, mucopurulent recurrent ([place]) 04/21/2013  . Cervical cancer screening 02/20/2016  . Hemorrhoid   . History of hidradenitis suppurativa   . Hyperlipemia   . IBS (irritable bowel syndrome)   . Low back pain 05/25/2016  . PCO (polycystic ovaries)   . Pruritus 08/22/2017    Past Surgical History:  Procedure Laterality Date  . ANKLE SURGERY     plate and 8 screws in left ankle  . HEMORRHOID SURGERY      Family History  Problem Relation Age of Onset  . Allergies Mother   . COPD Mother   . Heart disease Father        mitral valve disease/rupture during physical stress  . GER disease Father   . Irritable bowel syndrome Father   . Heart failure Father   . Hepatitis C Brother   . Cancer Brother 17       ALL  . Heart disease Daughter        asd s/p repair at age 27  . Anxiety disorder Daughter   . Cancer Maternal Grandmother        ovarian cancer  . Heart disease Maternal Grandfather        MI at 41  . Kidney disease Paternal Grandfather        possible kidney cancer  . Allergies Sister   . Eczema Sister   . Cancer Sister 84       breast, DCIS  . Allergies Daughter   . Ovarian cancer Unknown        Grandmother  . Arthritis Unknown     Social History   Socioeconomic History  . Marital status: Married    Spouse name: Not on file  . Number of children:  Not on file  . Years of education: Not on file  . Highest education level: Not on file  Occupational History  . Not on file  Social Needs  . Financial resource strain: Not on file  . Food insecurity:    Worry: Not on file    Inability: Not on file  . Transportation needs:    Medical: Not on file    Non-medical: Not on file  Tobacco Use  . Smoking status: Never Smoker  . Smokeless tobacco: Never Used  Substance and Sexual Activity  . Alcohol use: No    Comment: vegetarian  . Drug use: No  . Sexual activity: Yes    Partners: Male    Birth control/protection: Other-see comments    Comment: vasectomy  Lifestyle  . Physical activity:    Days per week: Not on file    Minutes per session: Not on file  . Stress: Not on file  Relationships  . Social connections:    Talks on  phone: Not on file    Gets together: Not on file    Attends religious service: Not on file    Active member of club or organization: Not on file    Attends meetings of clubs or organizations: Not on file    Relationship status: Not on file  . Intimate partner violence:    Fear of current or ex partner: Not on file    Emotionally abused: Not on file    Physically abused: Not on file    Forced sexual activity: Not on file  Other Topics Concern  . Not on file  Social History Narrative  . Not on file    Outpatient Medications Prior to Visit  Medication Sig Dispense Refill  . ALPRAZolam (XANAX) 0.25 MG tablet Take 1 tablet (0.25 mg total) by mouth 2 (two) times daily as needed for anxiety. 40 tablet 1  . fluticasone (FLONASE) 50 MCG/ACT nasal spray PLACE 2 SPRAYS INTO THE NOSE DAILY 16 g 8  . montelukast (SINGULAIR) 10 MG tablet Take 1 tablet (10 mg total) by mouth at bedtime as needed. 30 tablet 3  . Multiple Vitamin (MULTI-VITAMIN DAILY PO) Take by mouth daily.    Marland Kitchen OVER THE COUNTER MEDICATION OTC eye drops    . Probiotic Product (PRO-BIOTIC BLEND PO) Take by mouth daily.    Marland Kitchen venlafaxine XR (EFFEXOR  XR) 75 MG 24 hr capsule Take 1 capsule (75 mg total) by mouth daily with breakfast. 90 capsule 0  . fexofenadine (ALLEGRA ALLERGY) 180 MG tablet Take 1 tablet by mouth daily.    Marland Kitchen azelastine (ASTELIN) 0.1 % nasal spray Place 1-2 sprays into both nostrils 2 (two) times daily as needed for rhinitis. Use in each nostril as directed 30 mL 3  . Flaxseed, Linseed, (FLAXSEED OIL PO) Take by mouth.     No facility-administered medications prior to visit.     No Known Allergies  ROS     Objective:    Physical Exam  BP 118/70 (BP Location: Left Arm, Patient Position: Sitting, Cuff Size: Normal)   Pulse 68   Temp 98.4 F (36.9 C) (Oral)   Resp 18   Ht 5' 1.5" (1.562 m)   Wt 156 lb (70.8 kg)   LMP 09/13/2018   SpO2 98%   BMI 29.00 kg/m  Wt Readings from Last 3 Encounters:  09/19/18 156 lb (70.8 kg)  11/22/17 159 lb 6.4 oz (72.3 kg)  08/22/17 161 lb 9.6 oz (73.3 kg)   BP Readings from Last 3 Encounters:  09/19/18 118/70  11/22/17 138/60  08/22/17 130/84     Immunization History  Administered Date(s) Administered  . Influenza Whole 09/23/2012, 09/30/2013  . Influenza-Unspecified 09/23/2014, 09/26/2015  . Pneumococcal Conjugate-13 10/25/2014  . Td 03/07/2010  . Tdap 06/21/2014    Health Maintenance  Topic Date Due  . HIV Screening  02/27/1984  . INFLUENZA VACCINE  07/24/2018  . MAMMOGRAM  08/27/2018  . PAP SMEAR  02/19/2019  . TETANUS/TDAP  06/21/2024    Lab Results  Component Value Date   WBC 6.1 08/22/2017   HGB 14.4 08/22/2017   HCT 42.7 08/22/2017   PLT 334.0 08/22/2017   GLUCOSE 90 05/10/2017   CHOL 232 (H) 05/10/2017   TRIG 147.0 05/10/2017   HDL 60.20 05/10/2017   LDLDIRECT 186.0 08/14/2016   LDLCALC 142 (H) 05/10/2017   ALT 14 05/10/2017   AST 17 05/10/2017   NA 139 05/10/2017   K 4.0 05/10/2017   CL 104 05/10/2017  CREATININE 0.78 05/10/2017   BUN 16 05/10/2017   CO2 27 05/10/2017   TSH 1.98 08/22/2017    Lab Results  Component Value Date    TSH 1.98 08/22/2017   Lab Results  Component Value Date   WBC 6.1 08/22/2017   HGB 14.4 08/22/2017   HCT 42.7 08/22/2017   MCV 94.1 08/22/2017   PLT 334.0 08/22/2017   Lab Results  Component Value Date   NA 139 05/10/2017   K 4.0 05/10/2017   CO2 27 05/10/2017   GLUCOSE 90 05/10/2017   BUN 16 05/10/2017   CREATININE 0.78 05/10/2017   BILITOT 0.6 05/10/2017   ALKPHOS 62 05/10/2017   AST 17 05/10/2017   ALT 14 05/10/2017   PROT 7.1 05/10/2017   ALBUMIN 4.2 05/10/2017   CALCIUM 9.4 05/10/2017   GFR 83.71 05/10/2017   Lab Results  Component Value Date   CHOL 232 (H) 05/10/2017   Lab Results  Component Value Date   HDL 60.20 05/10/2017   Lab Results  Component Value Date   LDLCALC 142 (H) 05/10/2017   Lab Results  Component Value Date   TRIG 147.0 05/10/2017   Lab Results  Component Value Date   CHOLHDL 4 05/10/2017   No results found for: HGBA1C       Assessment & Plan:   Problem List Items Addressed This Visit    None      I have discontinued Katharine Look E. Picha's (Flaxseed, Linseed, (FLAXSEED OIL PO)), fexofenadine, and azelastine. I am also having her maintain her OVER THE COUNTER MEDICATION, Multiple Vitamin (MULTI-VITAMIN DAILY PO), Probiotic Product (PRO-BIOTIC BLEND PO), montelukast, ALPRAZolam, fluticasone, and venlafaxine XR.  No orders of the defined types were placed in this encounter.   {PROVIDER TO DELETE} [name], [place]

## 2018-09-19 NOTE — Assessment & Plan Note (Signed)
Encouraged heart healthy diet, increase exercise, avoid trans fats, consider a krill oil cap daily 

## 2018-10-12 ENCOUNTER — Other Ambulatory Visit: Payer: Self-pay | Admitting: Family Medicine

## 2018-10-13 MED ORDER — VENLAFAXINE HCL ER 75 MG PO CP24: 75.0000 mg | ORAL_CAPSULE | Freq: Every day | ORAL | 2 refills | Status: DC

## 2018-10-13 MED FILL — VENLAFAXINE HCL ER 75 MG CA: 75 | 90 days supply | Qty: 90 | Fill #0

## 2018-11-10 MED FILL — FLUTICASONE PROP 50 MCG SPR: 50 | 30 days supply | Qty: 16 | Fill #1

## 2018-12-26 ENCOUNTER — Telehealth: Payer: Self-pay

## 2018-12-26 ENCOUNTER — Ambulatory Visit (INDEPENDENT_AMBULATORY_CARE_PROVIDER_SITE_OTHER): Payer: No Typology Code available for payment source | Admitting: Ophthalmology

## 2018-12-26 ENCOUNTER — Encounter (HOSPITAL_COMMUNITY): Payer: Self-pay

## 2018-12-26 ENCOUNTER — Encounter (INDEPENDENT_AMBULATORY_CARE_PROVIDER_SITE_OTHER): Payer: Self-pay | Admitting: Ophthalmology

## 2018-12-26 ENCOUNTER — Other Ambulatory Visit: Payer: Self-pay

## 2018-12-26 ENCOUNTER — Emergency Department (HOSPITAL_COMMUNITY)
Admission: EM | Admit: 2018-12-26 | Discharge: 2018-12-26 | Disposition: A | Discharge status: Home / Self Care | Payer: No Typology Code available for payment source | Attending: Emergency Medicine | Admitting: Emergency Medicine

## 2018-12-26 ENCOUNTER — Telehealth: Payer: Self-pay | Admitting: *Deleted

## 2018-12-26 DIAGNOSIS — H35372 Puckering of macula, left eye: Secondary | ICD-10-CM

## 2018-12-26 DIAGNOSIS — H538 Other visual disturbances: Secondary | ICD-10-CM | POA: Diagnosis present

## 2018-12-26 DIAGNOSIS — H3581 Retinal edema: Secondary | ICD-10-CM | POA: Diagnosis not present

## 2018-12-26 DIAGNOSIS — Z9889 Other specified postprocedural states: Secondary | ICD-10-CM

## 2018-12-26 DIAGNOSIS — H3562 Retinal hemorrhage, left eye: Secondary | ICD-10-CM | POA: Insufficient documentation

## 2018-12-26 DIAGNOSIS — H04123 Dry eye syndrome of bilateral lacrimal glands: Secondary | ICD-10-CM

## 2018-12-26 DIAGNOSIS — J45909 Unspecified asthma, uncomplicated: Secondary | ICD-10-CM | POA: Insufficient documentation

## 2018-12-26 DIAGNOSIS — Z79899 Other long term (current) drug therapy: Secondary | ICD-10-CM | POA: Insufficient documentation

## 2018-12-26 DIAGNOSIS — H25813 Combined forms of age-related cataract, bilateral: Secondary | ICD-10-CM | POA: Diagnosis not present

## 2018-12-26 NOTE — ED Provider Notes (Signed)
[facility] DEPT Provider Note   CSN: [id number] Arrival date & time: 12/26/18  0902     History   Chief Complaint Chief Complaint  Patient presents with  . Blurred Vision    HPI Tricia Potts is a 48 y.o. female.  HPI   She states that she has some blurred vision in her left eye, it was noted when she awoke this morning.  She went to work but decided to come here because of persistent blurriness in the lateral side of her left eye, only.  She has a "slight" headache at the vertex today.  She denies double vision, paresthesia, weakness, difficulty walking, nausea or vomiting.  She states she had a similar brief episode several months ago.  She has not seen an eye doctor for these problems, yet.  There are no other known modifying factors.  Past Medical History:  Diagnosis Date  . Allergy   . Anemia 06/18/2014  . Anxiety   . Anxiety and depression 02/17/2009   Qualifier: Diagnosis of  By: Redmond Pulling MD, Frann Rider    . Asthma   . Broken ankle 2011   (left) roller skating  . Bronchitis, mucopurulent recurrent ([place]) 04/21/2013  . Cervical cancer screening 02/20/2016  . Hemorrhoid   . History of hidradenitis suppurativa   . Hyperlipemia   . IBS (irritable bowel syndrome)   . Low back pain 05/25/2016  . PCO (polycystic ovaries)   . Pruritus 08/22/2017    Patient Active Problem List   Diagnosis Date Noted  . Low back pain 05/25/2016  . Cervical cancer screening 02/20/2016  . Anemia 06/18/2014  . Preventative health care 04/23/2013  . ASTHMA 01/22/2011  . Hyperlipidemia, mixed 02/17/2009  . Anxiety and depression 02/17/2009  . HEMORRHOIDS 02/17/2009  . Allergic rhinitis 02/17/2009  . IRRITABLE BOWEL SYNDROME 02/17/2009    Past Surgical History:  Procedure Laterality Date  . ANKLE SURGERY     plate and 8 screws in left ankle  . HEMORRHOID SURGERY       OB History    Gravida  2   Para  2   Term  2   Preterm      AB      Living  3     SAB      TAB      Ectopic      Multiple  1   Live Births               Home Medications    Prior to Admission medications   Medication Sig Start Date End Date Taking? Authorizing Provider  ALPRAZolam (XANAX) 0.25 MG tablet Take 1 tablet (0.25 mg total) by mouth 2 (two) times daily as needed for anxiety. 12/12/17  Yes Mosie Lukes, MD  CALCIUM CITRATE PO Take 2 tablets by mouth daily.   Yes [provider]  fluticasone (FLONASE) 50 MCG/ACT nasal spray PLACE 2 SPRAYS INTO THE NOSE DAILY Patient taking differently: Place 2 sprays into both nostrils daily.  07/01/18  Yes Mosie Lukes, MD  hydrocortisone cream 1 % Apply 1 application topically as needed for itching.   Yes [provider]  ibuprofen (ADVIL,MOTRIN) 200 MG tablet Take 400-800 mg by mouth every 6 (six) hours as needed for headache or mild pain.   Yes [provider]  montelukast (SINGULAIR) 10 MG tablet Take 1 tablet (10 mg total) by mouth at bedtime as needed. 11/22/17  Yes Mosie Lukes, MD  Multiple  Vitamin (MULTI-VITAMIN DAILY PO) Take by mouth daily.   Yes [provider]  OVER THE COUNTER MEDICATION Place 1 drop into both eyes as needed (allergy symptoms). OTC eye drops  Systane/Artificial Tears/Homeopathic eye drops   Yes [provider]  venlafaxine XR (EFFEXOR XR) 75 MG 24 hr capsule Take 1 capsule (75 mg total) by mouth daily with breakfast. 10/13/18  Yes Mosie Lukes, MD    Family History Family History  Problem Relation Age of Onset  . Allergies Mother   . COPD Mother   . Heart disease Father        mitral valve disease/rupture during physical stress  . GER disease Father   . Irritable bowel syndrome Father   . Heart failure Father   . Hepatitis C Brother   . Cancer Brother 17       ALL  . Heart disease Daughter        asd s/p repair at age 1  . Anxiety disorder Daughter   . Cancer Maternal Grandmother        ovarian cancer  . Heart  disease Maternal Grandfather        MI at 69  . Kidney disease Paternal Grandfather        possible kidney cancer  . Allergies Sister   . Eczema Sister   . Cancer Sister 75       breast, DCIS  . Allergies Daughter   . Ovarian cancer Other        Grandmother  . Arthritis Other     Social History Social History   Tobacco Use  . Smoking status: Never Smoker  . Smokeless tobacco: Never Used  Substance Use Topics  . Alcohol use: No    Comment: vegetarian  . Drug use: No     Allergies   Patient has no known allergies.   Review of Systems Review of Systems  All other systems reviewed and are negative.    Physical Exam Updated Vital Signs BP (!) 133/99 (BP Location: Left Arm)   Pulse 62   Temp 98.6 F (37 C) (Oral)   Resp 18   Ht 5\' 1"  (1.549 m)   Wt 70.3 kg   LMP 01/17/2018   SpO2 98%   BMI 29.29 kg/m   Physical Exam Vitals signs and nursing note reviewed.  Constitutional:      Appearance: She is well-developed.  HENT:     Head: Normocephalic and atraumatic.     Right Ear: External ear normal.     Left Ear: External ear normal.     Mouth/Throat:     Mouth: Mucous membranes are moist.  Eyes:     General:        Right eye: No discharge.        Left eye: No discharge.     Extraocular Movements: Extraocular movements intact.     Conjunctiva/sclera: Conjunctivae normal.     Pupils: Pupils are equal, round, and reactive to light.     Comments: Funduscopy, by me indicates retinal hemorrhage left eye, lateral superior aspect.  Neck:     Musculoskeletal: Normal range of motion and neck supple.     Trachea: Phonation normal.  Cardiovascular:     Rate and Rhythm: Normal rate.  Pulmonary:     Effort: Pulmonary effort is normal.  Musculoskeletal: Normal range of motion.  Skin:    General: Skin is warm and dry.  Neurological:     Mental Status: She is alert  and oriented to person, place, and time.     Cranial Nerves: No cranial nerve deficit.     Sensory:  No sensory deficit.     Motor: No abnormal muscle tone.     Coordination: Coordination normal.     Comments: No dysarthria, aphasia or nystagmus.  Psychiatric:        Mood and Affect: Mood normal.        Behavior: Behavior normal.        Thought Content: Thought content normal.        Judgment: Judgment normal.      ED Treatments / Results  Labs (all labs ordered are listed, but only abnormal results are displayed) Labs Reviewed - No data to display  EKG None  Radiology No results found.  Procedures Procedures (including critical care time)  Medications Ordered in ED Medications - No data to display   Initial Impression / Assessment and Plan / ED Course  I have reviewed the triage vital signs and the nursing notes.  Pertinent labs & imaging results that were available during my care of the patient were reviewed by me and considered in my medical decision making (see chart for details).  Clinical Course as of Dec 27 1111  Fri Dec 26, 2018  1112 Case was discussed with Dr. Coralyn Pear, retinal surgeon, who will see the patient at 12:45 PM today.   [EW]    Clinical Course User Index [EW] Daleen Bo, MD     Patient Vitals for the past 24 hrs:  BP Temp Temp src Pulse Resp SpO2 Height Weight  12/26/18 0912 - - - - - - 5\' 1"  (1.549 m) 70.3 kg  12/26/18 0911 (!) 133/99 98.6 F (37 C) Oral 62 18 98 % - -    10:37 AM Reevaluation with update and discussion. After initial assessment and treatment, an updated evaluation reveals no change in clinical status.  Findings discussed with the patient all questions were answered. Daleen Bo   Medical Decision Making: Blurred vision secondary to an apparent retinal hemorrhage.  Etiology of hemorrhage is not clear.  Follow-up evaluation and treatment schedule urgently for 12:45 PM today.  CRITICAL CARE-no Performed by: Daleen Bo  Nursing Notes Reviewed/ Care Coordinated Applicable Imaging Reviewed Interpretation of  Laboratory Data incorporated into ED treatment  The patient appears reasonably screened and/or stabilized for discharge and I doubt any other medical condition or other [facility] requiring further screening, evaluation, or treatment in the ED at this time prior to discharge.  Plan: Home Medications-continue usual medications, avoid eyedrops for now; Home Treatments-rest; return here if the recommended treatment, does not improve the symptoms; Recommended follow up-ophthalmology follow-up today   Final Clinical Impressions(s) / ED Diagnoses   Final diagnoses:  Retinal hemorrhage of left eye    ED Discharge Orders    None       Daleen Bo, MD 12/26/18 1113

## 2018-12-26 NOTE — Telephone Encounter (Signed)
Spoke with Tricia Potts at [facility] about patient's need to see Ophthalmology today that ED had made her appointment for last night d/t retinal Hemorrhage of Left Eye. Patient has received approval from [place] at [facility] to be seen at Dr. Shirley Friar office, but they are requesting a referral by the PCP to be placed in the system. Referral complete and provider informed/SLS 01/03

## 2018-12-26 NOTE — Telephone Encounter (Signed)
Copied from [place] [id number]. Topic: General - Other >> Dec 26, 2018 12:09 PM Percell Belt A wrote: Reason for CRM: Pt called in stated that she went to Er last night and they are sending her to Dr Lubertha South for her blurred vision.  She has cone [facility] ins and just needs to ok from Dr Charlett Blake for the referral.  Her appt is 12:45 today.  She has contacted [facility] and let them know.

## 2018-12-26 NOTE — Telephone Encounter (Signed)
Referral placed.

## 2018-12-26 NOTE — ED Triage Notes (Signed)
Patient c/o left eye blurred vision and a slight headache. Since waking this AM. Patient states she has been using different eye drops for allergies and states she went to bed really late last night.

## 2018-12-26 NOTE — ED Notes (Signed)
ED Provider at bedside. 

## 2018-12-26 NOTE — Progress Notes (Addendum)
[facility] Note  12/26/2018     CHIEF COMPLAINT Patient presents for Retina Evaluation   HISTORY OF PRESENT ILLNESS: Tricia Potts is a 48 y.o. female who presents to the clinic today for:   HPI    Retina Evaluation    In left eye.  This started 4 hours ago.  Duration of 4 hours.  Associated Symptoms Floaters.  Context:  distance vision, mid-range vision and near vision.  Treatments tried include no treatments.  I, the attending physician,  performed the HPI with the patient and updated documentation appropriately.          Comments    48 y/o female pt began noticing dark areas in roughly half of her field of vision OS this morning.  Sudden onset.  Not accompanied by any pain or flashes.  No change in [place] OU.  Was seen in the ED this morning, and referred to Dr. Coralyn Pear for eval of possible retinal heme OS.  Symptoms have been constant since this morning.  Uses otc AT and allergy gtts prn OU.       Last edited by Bernarda Caffey, MD on 12/27/2018 12:26 AM. (History)    pt is a nurse at [employer], she states she went to [facility] ED this morning because when she woke up her left eye from center to temple seemed blurry, almost like it was under water, she states the nurses and radiologist she works with told her that she should be seen in the ED, pt states she had lasik sx 13 years ago performed by Dr. Gershon Crane, pt states her vision was great until about a year ago when it started to get blurry, pt states she wear glasses now and goes to [facility] for routine vision examines, pt uses homeopathic drops, systane and visine drops for dry eyes, pt states she has no history of eye trauma, other than getting hot oil into her eyes when she was a child  Referring physician:   HISTORICAL INFORMATION:   Selected notes from the [place] Referred by [facility] for concern of blurry vision / possible heme LEE:  Ocular Hx- PMH-    CURRENT  MEDICATIONS: No current outpatient medications on file. (Ophthalmic Drugs)   No current facility-administered medications for this visit.  (Ophthalmic Drugs)   Current Outpatient Medications (Other)  Medication Sig  . ALPRAZolam (XANAX) 0.25 MG tablet Take 1 tablet (0.25 mg total) by mouth 2 (two) times daily as needed for anxiety.  Marland Kitchen CALCIUM CITRATE PO Take 2 tablets by mouth daily.  . fluticasone (FLONASE) 50 MCG/ACT nasal spray PLACE 2 SPRAYS INTO THE NOSE DAILY (Patient taking differently: Place 2 sprays into both nostrils daily. )  . hydrocortisone cream 1 % Apply 1 application topically as needed for itching.  Marland Kitchen ibuprofen (ADVIL,MOTRIN) 200 MG tablet Take 400-800 mg by mouth every 6 (six) hours as needed for headache or mild pain.  . montelukast (SINGULAIR) 10 MG tablet Take 1 tablet (10 mg total) by mouth at bedtime as needed.  . Multiple Vitamin (MULTI-VITAMIN DAILY PO) Take by mouth daily.  Marland Kitchen OVER THE COUNTER MEDICATION Place 1 drop into both eyes as needed (allergy symptoms). OTC eye drops  Systane/Artificial Tears/Homeopathic eye drops  . venlafaxine XR (EFFEXOR XR) 75 MG 24 hr capsule Take 1 capsule (75 mg total) by mouth daily with breakfast.   No current facility-administered medications for this visit.  (Other)  REVIEW OF SYSTEMS: ROS    Positive for: Respiratory, Psychiatric   Negative for: Constitutional, Gastrointestinal, Neurological, Skin, Genitourinary, Musculoskeletal, HENT, Endocrine, Cardiovascular, Eyes, Allergic/Imm, Heme/Lymph   Last edited by Matthew Folks, COA on 12/26/2018  1:36 PM. (History)       ALLERGIES No Known Allergies  PAST MEDICAL HISTORY Past Medical History:  Diagnosis Date  . Allergy   . Anemia 06/18/2014  . Anxiety   . Anxiety and depression 02/17/2009   Qualifier: Diagnosis of  By: Redmond Pulling MD, Frann Rider    . Asthma   . Broken ankle 2011   (left) roller skating  . Bronchitis, mucopurulent recurrent ([place]) 04/21/2013  . Cervical  cancer screening 02/20/2016  . Hemorrhoid   . History of hidradenitis suppurativa   . Hyperlipemia   . IBS (irritable bowel syndrome)   . Low back pain 05/25/2016  . PCO (polycystic ovaries)   . Pruritus 08/22/2017   Past Surgical History:  Procedure Laterality Date  . ANKLE SURGERY     plate and 8 screws in left ankle  . HEMORRHOID SURGERY      FAMILY HISTORY Family History  Problem Relation Age of Onset  . Allergies Mother   . COPD Mother   . Heart disease Father        mitral valve disease/rupture during physical stress  . GER disease Father   . Irritable bowel syndrome Father   . Heart failure Father   . Hepatitis C Brother   . Cancer Brother 17       ALL  . Heart disease Daughter        asd s/p repair at age 4  . Anxiety disorder Daughter   . Cancer Maternal Grandmother        ovarian cancer  . Heart disease Maternal Grandfather        MI at 79  . Kidney disease Paternal Grandfather        possible kidney cancer  . Allergies Sister   . Eczema Sister   . Cancer Sister 67       breast, DCIS  . Allergies Daughter   . Ovarian cancer Other        Grandmother  . Arthritis Other     SOCIAL HISTORY Social History   Tobacco Use  . Smoking status: Never Smoker  . Smokeless tobacco: Never Used  Substance Use Topics  . Alcohol use: No    Comment: vegetarian  . Drug use: No         OPHTHALMIC EXAM:  Base Eye Exam    Visual Acuity (Snellen - Linear)      Right Left   Dist cc 20/20 - 20/20 -   Correction:  Glasses       Tonometry (Tonopen, 1:39 PM)      Right Left   Pressure 14 14       Pupils      Dark Light Shape React APD   Right 3 2 Round Brisk None   Left 3 2 Round Brisk None       Visual Fields (Counting fingers)      Left Right    Full Full       Extraocular Movement      Right Left    Full, Ortho Full, Ortho       Neuro/Psych    Oriented x3:  Yes   Mood/Affect:  Normal       Dilation    Both eyes:  1.0% Mydriacyl, 2.5%  Phenylephrine @ 1:39 PM        Slit Lamp and Fundus Exam    Slit Lamp Exam      Right Left   Lids/Lashes Normal Normal   Conjunctiva/Sclera White and quiet White and quiet   Cornea 2+ diffuse Punctate epithelial erosions, lasik scar barely visible 2+ Punctate epithelial erosions, lasik scar barely visible   Anterior Chamber deep and clear deep and clear   Iris Round and dilated Round and dilated   Lens 1+ Cortical cataract 1+ Cortical cataract, 1+ Nuclear sclerosis   Vitreous Vitreous syneresis Vitreous syneresis, Posterior vitreous detachment       Fundus Exam      Right Left   Disc Compact Pink and Sharp   C/D Ratio 0.0 0.0   Macula Flat, Blunted foveal reflex, No heme or edema Blunted foveal reflex, Epiretinal membrane   Vessels mild Tortuousity mild Tortuousity   Periphery Attached, pigmented cystoid dengeration peripherally, No RT/RD Attached, pigmented cystoid degeneration, No RT/RD        Refraction    Wearing Rx      Sphere Cylinder Axis Add   Right -0.25 Sphere  +2.00   Left -2.25 +1.50 068 +2.00       Manifest Refraction      Sphere Cylinder Axis Dist VA   Right -0.50 Sphere  20/20   Left -2.25 +1.25 070 20/20          IMAGING AND PROCEDURES  Imaging and Procedures for @TODAY @  OCT, Retina - OU - Both Eyes       Right Eye Quality was good. Central Foveal Thickness: 266. Progression has no prior data. Findings include normal foveal contour, no SRF, no IRF.   Left Eye Quality was good. Central Foveal Thickness: 308. Progression has no prior data. Findings include abnormal foveal contour, epiretinal membrane, no SRF, no IRF.   Notes *Images captured and stored on drive  Diagnosis / Impression:  OD: NFP, no IRF, SRF OS: +ERM; no IRF/SRF   Clinical management:  See below  Abbreviations: NFP - Normal foveal profile. CME - cystoid macular edema. PED - pigment epithelial detachment. IRF - intraretinal fluid. SRF - subretinal fluid. EZ - ellipsoid  zone. ERM - epiretinal membrane. ORA - outer retinal atrophy. ORT - outer retinal tubulation. SRHM - subretinal hyper-reflective material                 ASSESSMENT/PLAN:    ICD-10-CM   1. Epiretinal membrane (ERM) of left eye H35.372   2. Retinal edema H35.81 OCT, Retina - OU - Both Eyes  3. Combined forms of age-related cataract of both eyes H25.813   4. Hx of LASIK Z98.890   5. Dry eyes H04.123     1,2. Epiretinal membrane, OS  - The natural history, anatomy, potential for loss of vision, and treatment options including vitrectomy techniques and the complications of endophthalmitis, retinal detachment, vitreous hemorrhage, cataract progression and permanent vision loss discussed with the patient.  - mild ERM  - +metamorphopsia -- mild  - BCVA 20/20 OS  - no indication for surgery at this time  - monitor for now  - f/u 3 mos for repeat DFE/OCT, sooner prn  3. Cataracts  - Cortical cataract OD  - Cortical/Nuclear cataract OS  - The symptoms of cataract, surgical options, and treatments and risks were discussed with patient.  - discussed diagnosis and progression  - not yet visually significant  - monitor for now  4. History of  Lasik   - performed by Dr. Gershon Crane in 2007  - stable  5. Dry eyes OU  - recommend artificial tears and lubricating ointment as needed    Ophthalmic Meds Ordered this visit:  No orders of the defined types were placed in this encounter.      Return in about 3 months (around 03/27/2019) for F/U ERM, DFE, OCT.  There are no Patient Instructions on file for this visit.   Explained the diagnoses, plan, and follow up with the patient and they expressed understanding.  Patient expressed understanding of the importance of proper follow up care.   This document serves as a record of services personally performed by Gardiner Sleeper, MD, PhD. It was created on their behalf by Ernest Mallick, OA, an ophthalmic assistant. The creation of this record  is the provider's dictation and/or activities during the visit.    Electronically signed by: Ernest Mallick, OA  01.03.2020 12:27 AM    Gardiner Sleeper, M.D., Ph.D. Diseases & Surgery of the Retina and Vitreous [facility]  I have reviewed the above documentation for accuracy and completeness, and I agree with the above. Gardiner Sleeper, M.D., Ph.D. 12/27/18 12:27 AM   Abbreviations: M myopia (nearsighted); A astigmatism; H hyperopia (farsighted); P presbyopia; Mrx spectacle prescription;  CTL contact lenses; OD right eye; OS left eye; OU both eyes  XT exotropia; ET esotropia; PEK punctate epithelial keratitis; PEE punctate epithelial erosions; DES dry eye syndrome; MGD meibomian gland dysfunction; ATs artificial tears; PFAT's preservative free artificial tears; [place] nuclear sclerotic cataract; PSC posterior subcapsular cataract; ERM epi-retinal membrane; PVD posterior vitreous detachment; RD retinal detachment; DM diabetes mellitus; DR diabetic retinopathy; NPDR non-proliferative diabetic retinopathy; PDR proliferative diabetic retinopathy; CSME clinically significant macular edema; DME diabetic macular edema; dbh dot blot hemorrhages; CWS cotton wool spot; POAG primary open angle glaucoma; C/D cup-to-disc ratio; HVF humphrey visual field; GVF goldmann visual field; OCT optical coherence tomography; IOP intraocular pressure; BRVO Branch retinal vein occlusion; CRVO central retinal vein occlusion; CRAO central retinal artery occlusion; BRAO branch retinal artery occlusion; RT retinal tear; SB scleral buckle; PPV pars plana vitrectomy; VH Vitreous hemorrhage; PRP panretinal laser photocoagulation; IVK intravitreal kenalog; VMT vitreomacular traction; MH Macular hole;  NVD neovascularization of the disc; NVE neovascularization elsewhere; AREDS age related eye disease study; ARMD age related macular degeneration; POAG primary open angle glaucoma; EBMD epithelial/anterior basement  membrane dystrophy; ACIOL anterior chamber intraocular lens; IOL intraocular lens; PCIOL posterior chamber intraocular lens; Phaco/IOL phacoemulsification with intraocular lens placement; [place] photorefractive keratectomy; LASIK laser assisted in situ keratomileusis; HTN hypertension; DM diabetes mellitus; COPD chronic obstructive pulmonary disease

## 2018-12-26 NOTE — Discharge Instructions (Addendum)
We have scheduled an appointment to see Dr. Coralyn Pear at 12:45 PM today.  Go to his office a few minutes early for the appointment.

## 2018-12-27 ENCOUNTER — Encounter (INDEPENDENT_AMBULATORY_CARE_PROVIDER_SITE_OTHER): Payer: Self-pay | Admitting: Ophthalmology

## 2018-12-29 ENCOUNTER — Encounter: Payer: Self-pay | Admitting: Family Medicine

## 2018-12-29 MED ORDER — MONTELUKAST SODIUM 10 MG PO TABS: 10.0000 mg | ORAL_TABLET | Freq: Every evening | ORAL | 3 refills | Status: DC | PRN

## 2018-12-29 MED FILL — MONTELUKAST SOD 10 MG TAB: 10 | 90 days supply | Qty: 90 | Fill #0

## 2018-12-31 MED FILL — FLUTICASONE PROP 50 MCG SPR: 50 | 30 days supply | Qty: 16 | Fill #2

## 2019-01-21 MED FILL — VENLAFAXINE HCL ER 75 MG CA: 75 | 90 days supply | Qty: 90 | Fill #0

## 2019-01-26 ENCOUNTER — Telehealth: Payer: Self-pay

## 2019-01-26 NOTE — Telephone Encounter (Signed)
yes

## 2019-01-26 NOTE — Telephone Encounter (Signed)
Copied from [place] [id number]. Topic: Appointment Scheduling - Scheduling Inquiry for Clinic >> Jan 26, 2019  9:09 AM Rayann Heman wrote: Reason for CRM: pt called and stated that she would like to know if she would accept her husband as a pt. Please advise

## 2019-01-27 NOTE — Telephone Encounter (Signed)
Scheduled pts husband for 03/26/2019.

## 2019-01-27 NOTE — Telephone Encounter (Signed)
Elnita Maxwell can you schedule patients husband sometime in April  Please advise

## 2019-03-09 MED FILL — FLUTICASONE PROP 50 MCG SPR: 50 | 30 days supply | Qty: 16 | Fill #3

## 2019-03-24 ENCOUNTER — Ambulatory Visit: Payer: No Typology Code available for payment source | Admitting: Family Medicine

## 2019-04-06 ENCOUNTER — Ambulatory Visit: Payer: Self-pay

## 2019-04-06 ENCOUNTER — Ambulatory Visit: Payer: No Typology Code available for payment source | Admitting: Family Medicine

## 2019-04-06 ENCOUNTER — Ambulatory Visit (INDEPENDENT_AMBULATORY_CARE_PROVIDER_SITE_OTHER): Payer: No Typology Code available for payment source | Admitting: Family Medicine

## 2019-04-06 ENCOUNTER — Other Ambulatory Visit: Payer: Self-pay

## 2019-04-06 DIAGNOSIS — J309 Allergic rhinitis, unspecified: Secondary | ICD-10-CM | POA: Diagnosis not present

## 2019-04-06 DIAGNOSIS — F419 Anxiety disorder, unspecified: Secondary | ICD-10-CM | POA: Diagnosis not present

## 2019-04-06 DIAGNOSIS — F329 Major depressive disorder, single episode, unspecified: Secondary | ICD-10-CM

## 2019-04-06 DIAGNOSIS — N951 Menopausal and female climacteric states: Secondary | ICD-10-CM

## 2019-04-06 DIAGNOSIS — E782 Mixed hyperlipidemia: Secondary | ICD-10-CM

## 2019-04-06 MED ORDER — OLOPATADINE HCL 0.1 % OP SOLN: 1.0000 [drp] | Freq: Two times a day (BID) | OPHTHALMIC | 12 refills | Status: DC

## 2019-04-06 MED ORDER — FLUTICASONE PROPIONATE 50 MCG/ACT NA SUSP: NASAL | 8 refills | Status: DC

## 2019-04-06 MED ORDER — VENLAFAXINE HCL ER 75 MG PO CP24: 75.0000 mg | ORAL_CAPSULE | Freq: Every day | ORAL | 2 refills | Status: DC

## 2019-04-06 MED ORDER — FEXOFENADINE HCL 180 MG PO TABS: 180.0000 mg | ORAL_TABLET | Freq: Every day | ORAL | Status: DC

## 2019-04-06 MED FILL — VENLAFAXINE HCL ER 75 MG CA: 75 | 90 days supply | Qty: 90 | Fill #0

## 2019-04-06 MED FILL — OLOPATADINE HCL 0.1 % SOLN: 0.1 | 25 days supply | Qty: 5 | Fill #0

## 2019-04-06 MED FILL — FLUTICASONE PROP 50 MCG SPR: 50 | 30 days supply | Qty: 16 | Fill #0

## 2019-04-06 NOTE — Assessment & Plan Note (Signed)
Has just started to have some hot flashes. Encouraged hydrate with small, frequqnt meals with lean proteins and minimize carbs, get plenty of rest and if it gets worse could consider increasing Venlafaxine

## 2019-04-06 NOTE — Assessment & Plan Note (Signed)
Having bad symptoms this spring, her eyes are her greatest concerns. Very symptomatic, continue current meds and add Pataday eye drops and see if that is helpful

## 2019-04-06 NOTE — Progress Notes (Signed)
Virtual Visit via Video Note  I connected with Tricia Potts on 04/06/19 at  3:20 PM EDT by a video enabled telemedicine application and verified that I am speaking with the correct person using two identifiers.   I discussed the limitations of evaluation and management by telemedicine and the availability of in person appointments. The patient expressed understanding and agreed to proceed. Patient is set upon video platform by Tricia Potts, [place].     Subjective:    Patient ID: Tricia Potts, female    DOB: 04-Jun-1971, 48 y.o.   MRN: [id number]  No chief complaint on file.   HPI Patient is in today for follow up on chronic medical concerns including anxiety and depression, asthma, allergies, hyperlipidemia and more. She feels well today. She is working at [employer] but she feels she is toleraitng the stress well. The Venlafaxine is helpful. She notes she thinks she had her first real hot flash. Denies CP/palp/SOB/HA/fevers/GI or GU c/o. Taking meds as prescribed. She notes allergies have been bad this year with her eyes causing her the most trouble.   Past Medical History:  Diagnosis Date  . Allergy   . Anemia 06/18/2014  . Anxiety   . Anxiety and depression 02/17/2009   Qualifier: Diagnosis of  By: Redmond Pulling MD, Frann Rider    . Asthma   . Broken ankle 2011   (left) roller skating  . Bronchitis, mucopurulent recurrent ([place]) 04/21/2013  . Cervical cancer screening 02/20/2016  . Hemorrhoid   . History of hidradenitis suppurativa   . Hyperlipemia   . IBS (irritable bowel syndrome)   . Low back pain 05/25/2016  . PCO (polycystic ovaries)   . Pruritus 08/22/2017    Past Surgical History:  Procedure Laterality Date  . ANKLE SURGERY     plate and 8 screws in left ankle  . HEMORRHOID SURGERY      Family History  Problem Relation Age of Onset  . Allergies Mother   . COPD Mother   . Heart disease Father        mitral valve disease/rupture during physical stress  . GER disease Father   .  Irritable bowel syndrome Father   . Heart failure Father   . Hepatitis C Brother   . Cancer Brother 17       ALL  . Heart disease Daughter        asd s/p repair at age 31  . Anxiety disorder Daughter   . Cancer Maternal Grandmother        ovarian cancer  . Heart disease Maternal Grandfather        MI at 30  . Kidney disease Paternal Grandfather        possible kidney cancer  . Allergies Sister   . Eczema Sister   . Cancer Sister 12       breast, DCIS  . Allergies Daughter   . Ovarian cancer Other        Grandmother  . Arthritis Other     Social History   Socioeconomic History  . Marital status: Married    Spouse name: Not on file  . Number of children: Not on file  . Years of education: Not on file  . Highest education level: Not on file  Occupational History  . Not on file  Social Needs  . Financial resource strain: Not on file  . Food insecurity:    Worry: Not on file    Inability: Not on file  . Transportation  needs:    Medical: Not on file    Non-medical: Not on file  Tobacco Use  . Smoking status: Never Smoker  . Smokeless tobacco: Never Used  Substance and Sexual Activity  . Alcohol use: No    Comment: vegetarian  . Drug use: No  . Sexual activity: Yes    Partners: Male    Birth control/protection: Other-see comments    Comment: vasectomy  Lifestyle  . Physical activity:    Days per week: Not on file    Minutes per session: Not on file  . Stress: Not on file  Relationships  . Social connections:    Talks on phone: Not on file    Gets together: Not on file    Attends religious service: Not on file    Active member of club or organization: Not on file    Attends meetings of clubs or organizations: Not on file    Relationship status: Not on file  . Intimate partner violence:    Fear of current or ex partner: Not on file    Emotionally abused: Not on file    Physically abused: Not on file    Forced sexual activity: Not on file  Other Topics  Concern  . Not on file  Social History Narrative  . Not on file    Outpatient Medications Prior to Visit  Medication Sig Dispense Refill  . ALPRAZolam (XANAX) 0.25 MG tablet Take 1 tablet (0.25 mg total) by mouth 2 (two) times daily as needed for anxiety. 40 tablet 1  . CALCIUM CITRATE PO Take 2 tablets by mouth daily.    . hydrocortisone cream 1 % Apply 1 application topically as needed for itching.    Marland Kitchen ibuprofen (ADVIL,MOTRIN) 200 MG tablet Take 400-800 mg by mouth every 6 (six) hours as needed for headache or mild pain.    . montelukast (SINGULAIR) 10 MG tablet Take 1 tablet (10 mg total) by mouth at bedtime as needed. 90 tablet 3  . Multiple Vitamin (MULTI-VITAMIN DAILY PO) Take by mouth daily.    Marland Kitchen OVER THE COUNTER MEDICATION Place 1 drop into both eyes as needed (allergy symptoms). OTC eye drops  Systane/Artificial Tears/Homeopathic eye drops    . fluticasone (FLONASE) 50 MCG/ACT nasal spray PLACE 2 SPRAYS INTO THE NOSE DAILY (Patient taking differently: Place 2 sprays into both nostrils daily. ) 16 g 8  . venlafaxine XR (EFFEXOR XR) 75 MG 24 hr capsule Take 1 capsule (75 mg total) by mouth daily with breakfast. 90 capsule 2   No facility-administered medications prior to visit.     No Known Allergies  Review of Systems  Constitutional: Negative for fever and malaise/fatigue.  HENT: Positive for congestion.   Eyes: Positive for discharge. Negative for blurred vision, pain and redness.  Respiratory: Negative for shortness of breath.   Cardiovascular: Negative for chest pain, palpitations and leg swelling.  Gastrointestinal: Negative for abdominal pain, blood in stool and nausea.  Genitourinary: Negative for dysuria and frequency.  Musculoskeletal: Negative for falls.  Skin: Negative for rash.  Neurological: Negative for dizziness, loss of consciousness and headaches.  Endo/Heme/Allergies: Negative for environmental allergies.  Psychiatric/Behavioral: Negative for depression.  The patient is nervous/anxious.        Objective:    Physical Exam Constitutional:      Appearance: Normal appearance. She is not ill-appearing.  HENT:     Head: Normocephalic and atraumatic.  Pulmonary:     Effort: Pulmonary effort is normal.  Neurological:  Mental Status: She is alert and oriented to person, place, and time.  Psychiatric:        Mood and Affect: Mood normal.        Behavior: Behavior normal.        Thought Content: Thought content normal.     There were no vitals taken for this visit. Wt Readings from Last 3 Encounters:  12/26/18 155 lb (70.3 kg)  09/19/18 156 lb (70.8 kg)  11/22/17 159 lb 6.4 oz (72.3 kg)    Diabetic Foot Exam - Simple   No data filed     Lab Results  Component Value Date   WBC 4.8 09/19/2018   HGB 13.9 09/19/2018   HCT 40.5 09/19/2018   PLT 318.0 09/19/2018   GLUCOSE 80 09/19/2018   CHOL 247 (H) 09/19/2018   TRIG 165.0 (H) 09/19/2018   HDL 60.90 09/19/2018   LDLDIRECT 186.0 08/14/2016   LDLCALC 154 (H) 09/19/2018   ALT 13 09/19/2018   AST 14 09/19/2018   NA 138 09/19/2018   K 4.1 09/19/2018   CL 104 09/19/2018   CREATININE 0.67 09/19/2018   BUN 12 09/19/2018   CO2 26 09/19/2018   TSH 2.98 09/19/2018    Lab Results  Component Value Date   TSH 2.98 09/19/2018   Lab Results  Component Value Date   WBC 4.8 09/19/2018   HGB 13.9 09/19/2018   HCT 40.5 09/19/2018   MCV 91.7 09/19/2018   PLT 318.0 09/19/2018   Lab Results  Component Value Date   NA 138 09/19/2018   K 4.1 09/19/2018   CO2 26 09/19/2018   GLUCOSE 80 09/19/2018   BUN 12 09/19/2018   CREATININE 0.67 09/19/2018   BILITOT 0.4 09/19/2018   ALKPHOS 61 09/19/2018   AST 14 09/19/2018   ALT 13 09/19/2018   PROT 7.1 09/19/2018   ALBUMIN 4.2 09/19/2018   CALCIUM 9.1 09/19/2018   GFR 99.20 09/19/2018   Lab Results  Component Value Date   CHOL 247 (H) 09/19/2018   Lab Results  Component Value Date   HDL 60.90 09/19/2018   Lab Results   Component Value Date   LDLCALC 154 (H) 09/19/2018   Lab Results  Component Value Date   TRIG 165.0 (H) 09/19/2018   Lab Results  Component Value Date   CHOLHDL 4 09/19/2018   No results found for: HGBA1C     Assessment & Plan:   Problem List Items Addressed This Visit    Hyperlipidemia, mixed    Encouraged heart healthy diet, increase exercise, avoid trans fats, consider a krill oil cap daily, discussed repeating lipid panel in 05/2019      Anxiety and depression    Refill sent in for Venlafaxine 75 mg daily doing well consider increase PRN as she works at [employer]      Relevant Medications   venlafaxine XR (EFFEXOR XR) 75 MG 24 hr capsule   Allergic rhinitis    Having bad symptoms this spring, her eyes are her greatest concerns. Very symptomatic, continue current meds and add Pataday eye drops and see if that is helpful      Perimenopause    Has just started to have some hot flashes. Encouraged hydrate with small, frequqnt meals with lean proteins and minimize carbs, get plenty of rest and if it gets worse could consider increasing Venlafaxine         I am having Katharine Look E. Short start on fexofenadine and olopatadine. I am also having her maintain  her OVER THE COUNTER MEDICATION, Multiple Vitamin (MULTI-VITAMIN DAILY PO), ALPRAZolam, CALCIUM CITRATE PO, ibuprofen, hydrocortisone cream, montelukast, venlafaxine XR, and fluticasone.  Meds ordered this encounter  Medications  . venlafaxine XR (EFFEXOR XR) 75 MG 24 hr capsule    Sig: Take 1 capsule (75 mg total) by mouth daily with breakfast.    Dispense:  90 capsule    Refill:  2  . fluticasone (FLONASE) 50 MCG/ACT nasal spray    Sig: PLACE 2 SPRAYS INTO THE NOSE DAILY    Dispense:  16 g    Refill:  8  . fexofenadine (ALLEGRA) 180 MG tablet    Sig: Take 1 tablet (180 mg total) by mouth daily.  Marland Kitchen olopatadine (PATADAY) 0.1 % ophthalmic solution    Sig: Place 1 drop into both eyes 2 (two) times daily.    Dispense:  5 mL     Refill:  12      I discussed the assessment and treatment plan with the patient. The patient was provided an opportunity to ask questions and all were answered. The patient agreed with the plan and demonstrated an understanding of the instructions.   The patient was advised to call back or seek an in-person evaluation if the symptoms worsen or if the condition fails to improve as anticipated.  I provided 15 minutes of non-face-to-face time during this encounter.   Penni Homans, MD

## 2019-04-06 NOTE — Assessment & Plan Note (Signed)
Refill sent in for Venlafaxine 75 mg daily doing well consider increase PRN as she works at [employer]

## 2019-04-06 NOTE — Assessment & Plan Note (Signed)
>>  ASSESSMENT AND PLAN FOR OTHER ALLERGIC RHINITIS WRITTEN ON 04/06/2019 10:13 PM BY Penni Homans A, MD  Having bad symptoms this spring, her eyes are her greatest concerns. Very symptomatic, continue current meds and add Pataday eye drops and see if that is helpful

## 2019-04-06 NOTE — Assessment & Plan Note (Signed)
Encouraged heart healthy diet, increase exercise, avoid trans fats, consider a krill oil cap daily, discussed repeating lipid panel in 05/2019

## 2019-04-06 NOTE — Telephone Encounter (Signed)
Pt called stating that she was to have a web visit at 1520 today but has not received that test message. I spoke to the office. Per office pt was instructed to look for the text message. Just a little behind. Pt verbalized understanding

## 2019-04-08 ENCOUNTER — Encounter (INDEPENDENT_AMBULATORY_CARE_PROVIDER_SITE_OTHER): Payer: No Typology Code available for payment source | Admitting: Ophthalmology

## 2019-04-27 ENCOUNTER — Encounter: Payer: Self-pay | Admitting: Family Medicine

## 2019-04-27 ENCOUNTER — Other Ambulatory Visit: Payer: Self-pay | Admitting: Family Medicine

## 2019-04-27 MED ORDER — ACYCLOVIR 5 % EX OINT: 1.0000 "application " | TOPICAL_OINTMENT | CUTANEOUS | 1 refills | Status: DC

## 2019-04-27 MED FILL — ACYCLOVIR 5% OINTMENT: 5 | 10 days supply | Qty: 30 | Fill #0

## 2019-04-28 ENCOUNTER — Telehealth: Payer: No Typology Code available for payment source | Admitting: Physician Assistant

## 2019-04-28 DIAGNOSIS — R300 Dysuria: Secondary | ICD-10-CM | POA: Diagnosis not present

## 2019-04-28 MED ORDER — NITROFURANTOIN MONOHYD MACRO 100 MG PO CAPS: 100.0000 mg | ORAL_CAPSULE | Freq: Two times a day (BID) | ORAL | 0 refills | Status: AC

## 2019-04-28 MED FILL — NITROFURANTOIN MONO-MCR 100: 100 | 5 days supply | Qty: 10 | Fill #0

## 2019-04-28 NOTE — Progress Notes (Signed)
We are sorry that you are not feeling well.  Here is how we plan to help!  Based on what you shared with me it looks like you most likely have a simple urinary tract infection.  A UTI (Urinary Tract Infection) is a bacterial infection of the bladder.  Most cases of urinary tract infections are simple to treat but a key part of your care is to encourage you to drink plenty of fluids and watch your symptoms carefully.  I have prescribed MacroBid 100 mg twice a day for 5 days.  Your symptoms should gradually improve. Call us if the burning in your urine worsens, you develop worsening fever, back pain or pelvic pain or if your symptoms do not resolve after completing the antibiotic.  Urinary tract infections can be prevented by drinking plenty of water to keep your body hydrated.  Also be sure when you wipe, wipe from front to back and don't hold it in!  If possible, empty your bladder every 4 hours.  Your e-visit answers were reviewed by a board certified advanced clinical practitioner to complete your personal care plan.  Depending on the condition, your plan could have included both over the counter or prescription medications.  If there is a problem please reply once you have received a response from your provider.  Your safety is important to [place].  If you have drug allergies check your prescription carefully.    You can use MyChart to ask questions about today's visit, request a non-urgent call back, or ask for a work or school excuse for 24 hours related to this e-Visit. If it has been greater than 24 hours you will need to follow up with your provider, or enter a new e-Visit to address those concerns.   You will get an e-mail in the next two days asking about your experience.  I hope that your e-visit has been valuable and will speed your recovery. Thank you for using e-visits.    ===View-only below this line===   ----- Message -----    From: Richrd Humbles    Sent: 04/28/2019  9:19 AM  EDT      To: E-Visit Mailing List Subject: E-Visit Submission: Urinary Problems  E-Visit Submission: Urinary Problems --------------------------------  Question: Which of the following are you experiencing? Answer:   Pain while passing urine  Question: When you have pain when passing urine, which of these apply? Answer:   The pain feels like it is on the inside  Question: Are you able to pass urine? Answer:   Yes, I can pass urine.  Question: How long have you had pain or difficulty passing urine? Answer:   More  than two days but less than one week  Question: Do you have a fever? Answer:   No, I do not have a fever  Question: Do you have any of the following? Answer:   I have none of these problems  Question: Do you have an exaggerated sensation of the need to pass urine? Answer:   Yes, the sensation is exaggerated  Question: Do you have the urge to urinate more of less frequently than normal? Answer:   More frequently  Question: What does your urine look like? Answer:   It is cloudy  Question: Do you have any of the following? Answer:   None of the above  Question: Do you have any of the following? Answer:   No discharge  Question: Do you have any sores on your genitals? Answer:  No  Question: Do you have any history of kidney dysfunction or kidney problems? Answer:   No  Question: Within the past 3 months, have you had any surgery on your kidneys or bladder, or have you had a tube inserted to collect your urine? Answer:   No, I have never had either  Question: Have you had similar symptoms in the past? Answer:   No, I have never had  these symptoms  Question: Please list any additional comments  Answer:   It isn't pain, but rather pelvic fullness and a whole body tingling sensation after voiding.  Question: Please list your medication allergies that you may have ? (If 'none' , please list as 'none') Answer:   None  Question: Are you pregnant? Answer:   I  am confident that I am not pregnant  Question: Are you breastfeeding? Answer:   No  A total of 5-10 minutes was spent evaluating this patients questionnaire and formulating a plan of care.

## 2019-05-05 ENCOUNTER — Other Ambulatory Visit (HOSPITAL_BASED_OUTPATIENT_CLINIC_OR_DEPARTMENT_OTHER): Payer: Self-pay | Admitting: Family Medicine

## 2019-05-05 ENCOUNTER — Encounter: Payer: Self-pay | Admitting: Family Medicine

## 2019-05-05 DIAGNOSIS — Z1231 Encounter for screening mammogram for malignant neoplasm of breast: Secondary | ICD-10-CM

## 2019-05-05 MED FILL — FLUTICASONE PROP 50 MCG SPR: 50 | 30 days supply | Qty: 16 | Fill #4

## 2019-05-08 ENCOUNTER — Other Ambulatory Visit: Payer: Self-pay

## 2019-05-08 ENCOUNTER — Ambulatory Visit (HOSPITAL_BASED_OUTPATIENT_CLINIC_OR_DEPARTMENT_OTHER)
Admission: RE | Admit: 2019-05-08 | Discharge: 2019-05-08 | Disposition: A | Discharge status: Home / Self Care | Payer: No Typology Code available for payment source | Source: Ambulatory Visit | Attending: Family Medicine | Admitting: Family Medicine

## 2019-05-08 DIAGNOSIS — Z1231 Encounter for screening mammogram for malignant neoplasm of breast: Secondary | ICD-10-CM | POA: Diagnosis present

## 2019-05-18 ENCOUNTER — Encounter: Payer: Self-pay | Admitting: Family Medicine

## 2019-05-20 ENCOUNTER — Encounter: Payer: Self-pay | Admitting: Family Medicine

## 2019-05-20 ENCOUNTER — Other Ambulatory Visit: Payer: Self-pay | Admitting: Family Medicine

## 2019-05-20 MED ORDER — NYSTATIN 100000 UNIT/GM EX OINT: 1.0000 "application " | TOPICAL_OINTMENT | Freq: Two times a day (BID) | CUTANEOUS | 0 refills | Status: DC

## 2019-05-20 MED FILL — NYSTATIN 100,000 UNITS/GM O: 100000 | 10 days supply | Qty: 30 | Fill #0

## 2019-06-02 NOTE — Progress Notes (Signed)
[facility] Note  06/03/2019     CHIEF COMPLAINT Patient presents for Retina Follow Up   HISTORY OF PRESENT ILLNESS: Tricia Potts is a 48 y.o. female who presents to the clinic today for:   HPI    Retina Follow Up    Patient presents with  Other.  In left eye.  This started 5 months ago.  Severity is moderate.  I, the attending physician,  performed the HPI with the patient and updated documentation appropriately.          Comments    Patient here for 5 month retina follow up for ERM OS. Patient states vision doing fine. No eye pain.       Last edited by Bernarda Caffey, MD on 06/03/2019  8:24 AM. (History)    pt    Referring physician:   HISTORICAL INFORMATION:   Selected notes from the MEDICAL RECORD NUMBER Referred by [facility] for concern of blurry vision / possible heme LEE:  Ocular Hx- PMH-    CURRENT MEDICATIONS: Current Outpatient Medications (Ophthalmic Drugs)  Medication Sig  . olopatadine (PATADAY) 0.1 % ophthalmic solution Place 1 drop into both eyes 2 (two) times daily.   No current facility-administered medications for this visit.  (Ophthalmic Drugs)   Current Outpatient Medications (Other)  Medication Sig  . acyclovir ointment (ZOVIRAX) 5 % Apply 1 application topically every 3 (three) hours.  . ALPRAZolam (XANAX) 0.25 MG tablet Take 1 tablet (0.25 mg total) by mouth 2 (two) times daily as needed for anxiety.  Marland Kitchen CALCIUM CITRATE PO Take 2 tablets by mouth daily.  . fexofenadine (ALLEGRA) 180 MG tablet Take 1 tablet (180 mg total) by mouth daily.  . fluticasone (FLONASE) 50 MCG/ACT nasal spray PLACE 2 SPRAYS INTO THE NOSE DAILY  . hydrocortisone cream 1 % Apply 1 application topically as needed for itching.  Marland Kitchen ibuprofen (ADVIL,MOTRIN) 200 MG tablet Take 400-800 mg by mouth every 6 (six) hours as needed for headache or mild pain.  . montelukast (SINGULAIR) 10 MG tablet Take 1 tablet (10 mg total) by mouth at bedtime as  needed.  . Multiple Vitamin (MULTI-VITAMIN DAILY PO) Take by mouth daily.  Marland Kitchen nystatin ointment (MYCOSTATIN) Apply 1 application topically 2 (two) times daily.  Marland Kitchen OVER THE COUNTER MEDICATION Place 1 drop into both eyes as needed (allergy symptoms). OTC eye drops  Systane/Artificial Tears/Homeopathic eye drops  . venlafaxine XR (EFFEXOR XR) 75 MG 24 hr capsule Take 1 capsule (75 mg total) by mouth daily with breakfast.   No current facility-administered medications for this visit.  (Other)      REVIEW OF SYSTEMS: ROS    Positive for: Respiratory, Psychiatric   Negative for: Constitutional, Gastrointestinal, Neurological, Skin, Genitourinary, Musculoskeletal, HENT, Endocrine, Cardiovascular, Eyes, Allergic/Imm, Heme/Lymph   Last edited by Theodore Demark on 06/03/2019  8:10 AM. (History)       ALLERGIES No Known Allergies  PAST MEDICAL HISTORY Past Medical History:  Diagnosis Date  . Allergy   . Anemia 06/18/2014  . Anxiety   . Anxiety and depression 02/17/2009   Qualifier: Diagnosis of  By: Redmond Pulling MD, Frann Rider    . Asthma   . Broken ankle 2011   (left) roller skating  . Bronchitis, mucopurulent recurrent ([place]) 04/21/2013  . Cervical cancer screening 02/20/2016  . Hemorrhoid   . History of hidradenitis suppurativa   . Hyperlipemia   . IBS (irritable bowel syndrome)   . Low back  pain 05/25/2016  . PCO (polycystic ovaries)   . Pruritus 08/22/2017   Past Surgical History:  Procedure Laterality Date  . ANKLE SURGERY     plate and 8 screws in left ankle  . HEMORRHOID SURGERY      FAMILY HISTORY Family History  Problem Relation Age of Onset  . Allergies Mother   . COPD Mother   . Heart disease Father        mitral valve disease/rupture during physical stress  . GER disease Father   . Irritable bowel syndrome Father   . Heart failure Father   . Hepatitis C Brother   . Cancer Brother 17       ALL  . Heart disease Daughter        asd s/p repair at age 79  . Anxiety  disorder Daughter   . Cancer Maternal Grandmother        ovarian cancer  . Heart disease Maternal Grandfather        MI at 54  . Kidney disease Paternal Grandfather        possible kidney cancer  . Allergies Sister   . Eczema Sister   . Cancer Sister 62       breast, DCIS  . Allergies Daughter   . Ovarian cancer Other        Grandmother  . Arthritis Other     SOCIAL HISTORY Social History   Tobacco Use  . Smoking status: Never Smoker  . Smokeless tobacco: Never Used  Substance Use Topics  . Alcohol use: No    Comment: vegetarian  . Drug use: No         OPHTHALMIC EXAM:  Base Eye Exam    Visual Acuity (Snellen - Linear)      Right Left   Dist cc 20/20 20/20   Correction:  Glasses       Tonometry (Tonopen, 8:08 AM)      Right Left   Pressure 15 16       Pupils      Dark Light Shape React APD   Right 3 2 Round Brisk None   Left 3 2 Round Brisk None       Visual Fields (Counting fingers)      Left Right    Full Full       Extraocular Movement      Right Left    Full, Ortho Full, Ortho       Neuro/Psych    Oriented x3:  Yes   Mood/Affect:  Normal       Dilation    Both eyes:  1.0% Mydriacyl, 2.5% Phenylephrine @ 8:08 AM        Slit Lamp and Fundus Exam    Slit Lamp Exam      Right Left   Lids/Lashes Normal Normal   Conjunctiva/Sclera White and quiet White and quiet   Cornea 2+ diffuse Punctate epithelial erosions, lasik scar barely visible 2+ Punctate epithelial erosions, lasik scar barely visible   Anterior Chamber deep and clear deep and clear   Iris Round and dilated Round and dilated   Lens 1+ Cortical cataract, 1+ Nuclear sclerosis 1+ Cortical cataract, 1+ Nuclear sclerosis   Vitreous Vitreous syneresis Vitreous syneresis, Posterior vitreous detachment       Fundus Exam      Right Left   Disc Compact Pink and Sharp, mild tilt   C/D Ratio 0.0 0.0   Macula Flat, Blunted foveal reflex, No heme or  edema, trace Epiretinal membrane  good foveal reflex, Epiretinal membrane greatest inferiorly   Vessels Mild Vascular attenuation, Tortuousity Mild Vascular attenuation, Tortuous   Periphery Attached, pigmented cystoid dengeration peripherally, No RT/RD Attached, pigmented cystoid degeneration, No RT/RD        Refraction    Wearing Rx      Sphere Cylinder Axis Add   Right -0.25 Sphere  +2.00   Left -2.25 +1.50 068 +2.00          IMAGING AND PROCEDURES  Imaging and Procedures for @TODAY @  OCT, Retina - OU - Both Eyes       Right Eye Quality was good. Central Foveal Thickness: 266. Progression has been stable. Findings include normal foveal contour, no SRF, no IRF.   Left Eye Quality was good. Central Foveal Thickness: 316. Progression has been stable. Findings include abnormal foveal contour, epiretinal membrane, no SRF, no IRF (No significant change from prior).   Notes *Images captured and stored on drive  Diagnosis / Impression:  OD: NFP, no IRF, SRF OS: +ERM; no IRF/SRF -- no significant change from prior   Clinical management:  See below  Abbreviations: NFP - Normal foveal profile. CME - cystoid macular edema. PED - pigment epithelial detachment. IRF - intraretinal fluid. SRF - subretinal fluid. EZ - ellipsoid zone. ERM - epiretinal membrane. ORA - outer retinal atrophy. ORT - outer retinal tubulation. SRHM - subretinal hyper-reflective material                 ASSESSMENT/PLAN:    ICD-10-CM   1. Epiretinal membrane (ERM) of left eye H35.372   2. Retinal edema H35.81 OCT, Retina - OU - Both Eyes  3. Combined forms of age-related cataract of both eyes H25.813   4. Hx of LASIK Z98.890   5. Dry eyes H04.123     1,2. Epiretinal membrane, OS  - mild ERM -- no significant change from prior  - no metamorphopsia  - BCVA remains 20/20 OS  - no indication for surgery at this time  - monitor for now  - f/u 6-9 mos for repeat DFE/OCT, sooner prn  3. Cataracts  - Mild Cortical/Nuclear  cataract OU  - The symptoms of cataract, surgical options, and treatments and risks were discussed with patient.  - discussed diagnosis and progression  - not yet visually significant  - monitor for now  4. History of Lasik   - performed by Dr. Gershon Crane in 2007  - stable  5. Dry eyes OU  - recommend artificial tears and lubricating ointment as needed    Ophthalmic Meds Ordered this visit:  No orders of the defined types were placed in this encounter.      Return for f/u 6-9 months, ERM OS - Dilated Exam, OCT.  There are no Patient Instructions on file for this visit.   Explained the diagnoses, plan, and follow up with the patient and they expressed understanding.  Patient expressed understanding of the importance of proper follow up care.   This document serves as a record of services personally performed by Gardiner Sleeper, MD, PhD. It was created on their behalf by Ernest Mallick, OA, an ophthalmic assistant. The creation of this record is the provider's dictation and/or activities during the visit.    Electronically signed by: Ernest Mallick, OA  06.09.2020 8:53 AM     Gardiner Sleeper, M.D., Ph.D. Diseases & Surgery of the Retina and Vitreous [facility]  I have reviewed the  above documentation for accuracy and completeness, and I agree with the above. Gardiner Sleeper, M.D., Ph.D. 06/03/19 8:55 AM     Abbreviations: M myopia (nearsighted); A astigmatism; H hyperopia (farsighted); P presbyopia; Mrx spectacle prescription;  CTL contact lenses; OD right eye; OS left eye; OU both eyes  XT exotropia; ET esotropia; PEK punctate epithelial keratitis; PEE punctate epithelial erosions; DES dry eye syndrome; MGD meibomian gland dysfunction; ATs artificial tears; PFAT's preservative free artificial tears; [place] nuclear sclerotic cataract; PSC posterior subcapsular cataract; ERM epi-retinal membrane; PVD posterior vitreous detachment; RD retinal detachment; DM  diabetes mellitus; DR diabetic retinopathy; NPDR non-proliferative diabetic retinopathy; PDR proliferative diabetic retinopathy; CSME clinically significant macular edema; DME diabetic macular edema; dbh dot blot hemorrhages; CWS cotton wool spot; POAG primary open angle glaucoma; C/D cup-to-disc ratio; HVF humphrey visual field; GVF goldmann visual field; OCT optical coherence tomography; IOP intraocular pressure; BRVO Branch retinal vein occlusion; CRVO central retinal vein occlusion; CRAO central retinal artery occlusion; BRAO branch retinal artery occlusion; RT retinal tear; SB scleral buckle; PPV pars plana vitrectomy; VH Vitreous hemorrhage; PRP panretinal laser photocoagulation; IVK intravitreal kenalog; VMT vitreomacular traction; MH Macular hole;  NVD neovascularization of the disc; NVE neovascularization elsewhere; AREDS age related eye disease study; ARMD age related macular degeneration; POAG primary open angle glaucoma; EBMD epithelial/anterior basement membrane dystrophy; ACIOL anterior chamber intraocular lens; IOL intraocular lens; PCIOL posterior chamber intraocular lens; Phaco/IOL phacoemulsification with intraocular lens placement; [place] photorefractive keratectomy; LASIK laser assisted in situ keratomileusis; HTN hypertension; DM diabetes mellitus; COPD chronic obstructive pulmonary disease

## 2019-06-03 ENCOUNTER — Other Ambulatory Visit: Payer: Self-pay

## 2019-06-03 ENCOUNTER — Ambulatory Visit (INDEPENDENT_AMBULATORY_CARE_PROVIDER_SITE_OTHER): Payer: No Typology Code available for payment source | Admitting: Ophthalmology

## 2019-06-03 ENCOUNTER — Encounter (INDEPENDENT_AMBULATORY_CARE_PROVIDER_SITE_OTHER): Payer: Self-pay | Admitting: Ophthalmology

## 2019-06-03 DIAGNOSIS — H35372 Puckering of macula, left eye: Secondary | ICD-10-CM

## 2019-06-03 DIAGNOSIS — Z9889 Other specified postprocedural states: Secondary | ICD-10-CM | POA: Diagnosis not present

## 2019-06-03 DIAGNOSIS — H04123 Dry eye syndrome of bilateral lacrimal glands: Secondary | ICD-10-CM

## 2019-06-03 DIAGNOSIS — H3581 Retinal edema: Secondary | ICD-10-CM | POA: Diagnosis not present

## 2019-06-03 DIAGNOSIS — H25813 Combined forms of age-related cataract, bilateral: Secondary | ICD-10-CM | POA: Diagnosis not present

## 2019-07-22 MED FILL — VENLAFAXINE HCL ER 75 MG CA: 75 | 90 days supply | Qty: 90 | Fill #1

## 2019-09-14 MED FILL — FLUTICASONE PROP 50 MCG SPR: 50 | 30 days supply | Qty: 16 | Fill #1

## 2019-09-16 ENCOUNTER — Other Ambulatory Visit: Payer: Self-pay | Admitting: Family Medicine

## 2019-09-16 ENCOUNTER — Telehealth: Payer: Self-pay | Admitting: Family Medicine

## 2019-09-16 MED ORDER — ALBUTEROL SULFATE HFA 108 (90 BASE) MCG/ACT IN AERS: 2.0000 | INHALATION_SPRAY | Freq: Four times a day (QID) | RESPIRATORY_TRACT | 2 refills | Status: DC | PRN

## 2019-09-16 NOTE — Telephone Encounter (Signed)
Please advise 

## 2019-09-16 NOTE — Telephone Encounter (Signed)
Pt had to reschedule her CPE due to provider schedule. She is scheduled for 10/13 now. Pt stated that [facility] advised to get have a rescue inhaler prescribed and on hand for asthma flares. Pt asked that inhaler be sent prior to 10/13 appt.   [place], [place] - [place] [phone] (Phone) [phone] (Fax)

## 2019-09-16 NOTE — Telephone Encounter (Signed)
Have sent in Albuterol

## 2019-09-17 MED FILL — ALBUTEROL SULFATE HFA 108 (: 108 (90 BAS | 25 days supply | Qty: 18 | Fill #0

## 2019-09-17 NOTE — Telephone Encounter (Signed)
Sent patient a my chart message about medication.

## 2019-09-22 ENCOUNTER — Encounter: Payer: No Typology Code available for payment source | Admitting: Family Medicine

## 2019-09-30 MED FILL — ALBUTEROL SULFATE HFA 108 (: 108 (90 BAS | 25 days supply | Qty: 18 | Fill #0

## 2019-10-05 ENCOUNTER — Encounter: Payer: Self-pay | Admitting: Family Medicine

## 2019-10-05 NOTE — Telephone Encounter (Signed)
Called to conf appt and pt advised me of below. Appt 10/13 cancelled. Pt was informed she will get a call back from [place] to RS appt.

## 2019-10-06 ENCOUNTER — Encounter: Payer: No Typology Code available for payment source | Admitting: Family Medicine

## 2019-10-09 NOTE — Telephone Encounter (Signed)
There is not spot soon where she can be worked in at. Looking for spots every week for her

## 2019-10-26 ENCOUNTER — Encounter: Payer: Self-pay | Admitting: Family Medicine

## 2019-10-27 MED FILL — VENLAFAXINE HCL ER 75 MG CA: 75 | 90 days supply | Qty: 90 | Fill #2

## 2019-10-30 ENCOUNTER — Other Ambulatory Visit: Payer: Self-pay

## 2019-10-30 ENCOUNTER — Ambulatory Visit (INDEPENDENT_AMBULATORY_CARE_PROVIDER_SITE_OTHER): Payer: No Typology Code available for payment source | Admitting: Family Medicine

## 2019-10-30 ENCOUNTER — Encounter: Payer: Self-pay | Admitting: Family Medicine

## 2019-10-30 DIAGNOSIS — J309 Allergic rhinitis, unspecified: Secondary | ICD-10-CM

## 2019-10-30 DIAGNOSIS — M545 Low back pain, unspecified: Secondary | ICD-10-CM

## 2019-10-30 DIAGNOSIS — L309 Dermatitis, unspecified: Secondary | ICD-10-CM

## 2019-10-30 DIAGNOSIS — M8580 Other specified disorders of bone density and structure, unspecified site: Secondary | ICD-10-CM | POA: Diagnosis not present

## 2019-10-30 DIAGNOSIS — K219 Gastro-esophageal reflux disease without esophagitis: Secondary | ICD-10-CM

## 2019-10-30 MED ORDER — CLINDAMYCIN PHOS-BENZOYL PEROX 1-5 % EX GEL: Freq: Two times a day (BID) | CUTANEOUS | 1 refills | Status: DC

## 2019-10-30 MED ORDER — TIZANIDINE HCL 4 MG PO TABS: 2.0000 mg | ORAL_TABLET | Freq: Two times a day (BID) | ORAL | 1 refills | Status: DC | PRN

## 2019-10-30 MED ORDER — FAMOTIDINE 40 MG PO TABS: 40.0000 mg | ORAL_TABLET | Freq: Every day | ORAL | 2 refills | Status: DC

## 2019-10-30 MED ORDER — FEXOFENADINE HCL 180 MG PO TABS: 180.0000 mg | ORAL_TABLET | Freq: Every day | ORAL | 1 refills | Status: DC

## 2019-10-30 MED FILL — tiZANidine HCL 4 MG TABS: 4 | 15 days supply | Qty: 30 | Fill #0

## 2019-10-30 MED FILL — CLINDAMYCIN PHOS-BENZOYL PE: 1-5 | 15 days supply | Qty: 25 | Fill #0

## 2019-10-30 MED FILL — FAMOTIDINE 40 MG TABLET: 40 | 30 days supply | Qty: 30 | Fill #0

## 2019-10-30 MED FILL — FEXOFENADINE HCL 180 MG TAB: 180 | 90 days supply | Qty: 90 | Fill #0

## 2019-11-02 DIAGNOSIS — M8580 Other specified disorders of bone density and structure, unspecified site: Secondary | ICD-10-CM | POA: Insufficient documentation

## 2019-11-02 DIAGNOSIS — K219 Gastro-esophageal reflux disease without esophagitis: Secondary | ICD-10-CM | POA: Insufficient documentation

## 2019-11-02 NOTE — Assessment & Plan Note (Signed)
Use antihistamine and nasal saline daily, nasal steroids daily

## 2019-11-02 NOTE — Assessment & Plan Note (Signed)
Notes cough after eating at times. Avoid offending foods, start probiotics. Do not eat large meals in late evening and consider raising head of bed. Add Famotidine qhs report if symptoms do not improve.

## 2019-11-02 NOTE — Assessment & Plan Note (Signed)
Having recurrent trouble around her nose. Has tried Benzoyl Peroxide, Differin, salicylic acid, Witch hazel and more. Continue Witch Hazel Astringent and add Benzaclin report if no imrpovement

## 2019-11-02 NOTE — Assessment & Plan Note (Addendum)
With intermittent bilateral hip pain and occasional radicular symptoms down legs. Encouraged moist heat and gentle stretching as tolerated. May try NSAIDs and prescription meds as directed and report if symptoms worsen or seek immediate care. Encouraged Chiropractic and massage therapy. Report if ready for referral and imaging. Given rx for Tizanidine prn

## 2019-11-02 NOTE — Progress Notes (Signed)
Virtual Visit via Video Note  I connected with Tricia Potts on 10/30/19 at  8:40 AM EST by a video enabled telemedicine application and verified that I am speaking with the correct person using two identifiers.  Location: Patient: car Provider: home   I discussed the limitations of evaluation and management by telemedicine and the availability of in person appointments. The patient expressed understanding and agreed to proceed. Princess Cater CMA was able to get the patient set up on video visit   Subjective:    Patient ID: Tricia Potts, female    DOB: 1971-12-01, 48 y.o.   MRN: [id number]  No chief complaint on file.   HPI Patient is in today for evaluation of perinasal rash that has not responded to numerous treatments. Having recurrent trouble around her nose. Has tried Benzoyl Peroxide, Differin, salicylic acid, Witch hazel and more. It improves and then worsens again. Notes also an increase in low back pain with some intermittent hip pain and radicular symptoms. No c/o incontinence. Notes a cough after eating at times and some heartburn and allergies with congestion. Denies CP/palp/SOB/HA/fevers or GU c/o. Taking meds as prescribed  Past Medical History:  Diagnosis Date  . Allergy   . Anemia 06/18/2014  . Anxiety   . Anxiety and depression 02/17/2009   Qualifier: Diagnosis of  By: Redmond Pulling MD, Frann Rider    . Asthma   . Broken ankle 2011   (left) roller skating  . Bronchitis, mucopurulent recurrent ([place]) 04/21/2013  . Cervical cancer screening 02/20/2016  . Hemorrhoid   . History of hidradenitis suppurativa   . Hyperlipemia   . IBS (irritable bowel syndrome)   . Low back pain 05/25/2016  . PCO (polycystic ovaries)   . Pruritus 08/22/2017    Past Surgical History:  Procedure Laterality Date  . ANKLE SURGERY     plate and 8 screws in left ankle  . HEMORRHOID SURGERY      Family History  Problem Relation Age of Onset  . Allergies Mother   . COPD Mother   . Heart disease  Father        mitral valve disease/rupture during physical stress  . GER disease Father   . Irritable bowel syndrome Father   . Heart failure Father   . Hepatitis C Brother   . Cancer Brother 17       ALL  . Heart disease Daughter        asd s/p repair at age 41  . Anxiety disorder Daughter   . Cancer Maternal Grandmother        ovarian cancer  . Heart disease Maternal Grandfather        MI at 59  . Kidney disease Paternal Grandfather        possible kidney cancer  . Allergies Sister   . Eczema Sister   . Cancer Sister 23       breast, DCIS  . Allergies Daughter   . Ovarian cancer Other        Grandmother  . Arthritis Other     Social History   Socioeconomic History  . Marital status: Married    Spouse name: Not on file  . Number of children: Not on file  . Years of education: Not on file  . Highest education level: Not on file  Occupational History  . Not on file  Social Needs  . Financial resource strain: Not on file  . Food insecurity    Worry: Not on file  Inability: Not on file  . Transportation needs    Medical: Not on file    Non-medical: Not on file  Tobacco Use  . Smoking status: Never Smoker  . Smokeless tobacco: Never Used  Substance and Sexual Activity  . Alcohol use: No    Comment: vegetarian  . Drug use: No  . Sexual activity: Yes    Partners: Male    Birth control/protection: Other-see comments    Comment: vasectomy  Lifestyle  . Physical activity    Days per week: Not on file    Minutes per session: Not on file  . Stress: Not on file  Relationships  . Social Herbalist on phone: Not on file    Gets together: Not on file    Attends religious service: Not on file    Active member of club or organization: Not on file    Attends meetings of clubs or organizations: Not on file    Relationship status: Not on file  . Intimate partner violence    Fear of current or ex partner: Not on file    Emotionally abused: Not on file     Physically abused: Not on file    Forced sexual activity: Not on file  Other Topics Concern  . Not on file  Social History Narrative  . Not on file    Outpatient Medications Prior to Visit  Medication Sig Dispense Refill  . acyclovir ointment (ZOVIRAX) 5 % Apply 1 application topically every 3 (three) hours. 30 g 1  . albuterol (VENTOLIN HFA) 108 (90 Base) MCG/ACT inhaler Inhale 2 puffs into the lungs every 6 (six) hours as needed for wheezing or shortness of breath. 18 g 2  . ALPRAZolam (XANAX) 0.25 MG tablet Take 1 tablet (0.25 mg total) by mouth 2 (two) times daily as needed for anxiety. 40 tablet 1  . CALCIUM CITRATE PO Take 2 tablets by mouth daily.    . fluticasone (FLONASE) 50 MCG/ACT nasal spray PLACE 2 SPRAYS INTO THE NOSE DAILY 16 g 8  . hydrocortisone cream 1 % Apply 1 application topically as needed for itching.    Marland Kitchen ibuprofen (ADVIL,MOTRIN) 200 MG tablet Take 400-800 mg by mouth every 6 (six) hours as needed for headache or mild pain.    . montelukast (SINGULAIR) 10 MG tablet Take 1 tablet (10 mg total) by mouth at bedtime as needed. 90 tablet 3  . Multiple Vitamin (MULTI-VITAMIN DAILY PO) Take by mouth daily.    Marland Kitchen nystatin ointment (MYCOSTATIN) Apply 1 application topically 2 (two) times daily. 30 g 0  . olopatadine (PATADAY) 0.1 % ophthalmic solution Place 1 drop into both eyes 2 (two) times daily. 5 mL 12  . OVER THE COUNTER MEDICATION Place 1 drop into both eyes as needed (allergy symptoms). OTC eye drops  Systane/Artificial Tears/Homeopathic eye drops    . venlafaxine XR (EFFEXOR XR) 75 MG 24 hr capsule Take 1 capsule (75 mg total) by mouth daily with breakfast. 90 capsule 2  . fexofenadine (ALLEGRA) 180 MG tablet Take 1 tablet (180 mg total) by mouth daily.     No facility-administered medications prior to visit.     No Known Allergies  Review of Systems  Constitutional: Negative for fever and malaise/fatigue.  HENT: Positive for congestion.   Eyes: Negative for  blurred vision.  Respiratory: Positive for cough. Negative for shortness of breath.   Cardiovascular: Negative for chest pain, palpitations and leg swelling.  Gastrointestinal: Positive for heartburn.  Negative for abdominal pain, blood in stool and nausea.  Genitourinary: Negative for dysuria and frequency.  Musculoskeletal: Positive for back pain and joint pain. Negative for falls.  Skin: Positive for rash.  Neurological: Negative for dizziness, loss of consciousness and headaches.  Endo/Heme/Allergies: Negative for environmental allergies.  Psychiatric/Behavioral: Negative for depression. The patient is not nervous/anxious.        Objective:    Physical Exam Constitutional:      Appearance: Normal appearance. She is normal weight. She is not ill-appearing.  HENT:     Head: Normocephalic and atraumatic.     Nose: Nose normal.  Eyes:     General:        Right eye: No discharge.        Left eye: No discharge.  Pulmonary:     Effort: Pulmonary effort is normal.  Skin:    Findings: Rash present.     Comments: Erythematous around edge of nose  Neurological:     Mental Status: She is alert and oriented to person, place, and time.  Psychiatric:        Mood and Affect: Mood normal.        Behavior: Behavior normal.     Wt 158 lb (71.7 kg)   BMI 29.85 kg/m  Wt Readings from Last 3 Encounters:  10/30/19 158 lb (71.7 kg)  12/26/18 155 lb (70.3 kg)  09/19/18 156 lb (70.8 kg)    Diabetic Foot Exam - Simple   No data filed     Lab Results  Component Value Date   WBC 4.8 09/19/2018   HGB 13.9 09/19/2018   HCT 40.5 09/19/2018   PLT 318.0 09/19/2018   GLUCOSE 80 09/19/2018   CHOL 247 (H) 09/19/2018   TRIG 165.0 (H) 09/19/2018   HDL 60.90 09/19/2018   LDLDIRECT 186.0 08/14/2016   LDLCALC 154 (H) 09/19/2018   ALT 13 09/19/2018   AST 14 09/19/2018   NA 138 09/19/2018   K 4.1 09/19/2018   CL 104 09/19/2018   CREATININE 0.67 09/19/2018   BUN 12 09/19/2018   CO2 26  09/19/2018   TSH 2.98 09/19/2018    Lab Results  Component Value Date   TSH 2.98 09/19/2018   Lab Results  Component Value Date   WBC 4.8 09/19/2018   HGB 13.9 09/19/2018   HCT 40.5 09/19/2018   MCV 91.7 09/19/2018   PLT 318.0 09/19/2018   Lab Results  Component Value Date   NA 138 09/19/2018   K 4.1 09/19/2018   CO2 26 09/19/2018   GLUCOSE 80 09/19/2018   BUN 12 09/19/2018   CREATININE 0.67 09/19/2018   BILITOT 0.4 09/19/2018   ALKPHOS 61 09/19/2018   AST 14 09/19/2018   ALT 13 09/19/2018   PROT 7.1 09/19/2018   ALBUMIN 4.2 09/19/2018   CALCIUM 9.1 09/19/2018   GFR 99.20 09/19/2018   Lab Results  Component Value Date   CHOL 247 (H) 09/19/2018   Lab Results  Component Value Date   HDL 60.90 09/19/2018   Lab Results  Component Value Date   LDLCALC 154 (H) 09/19/2018   Lab Results  Component Value Date   TRIG 165.0 (H) 09/19/2018   Lab Results  Component Value Date   CHOLHDL 4 09/19/2018   No results found for: HGBA1C     Assessment & Plan:   Problem List Items Addressed This Visit    Allergic rhinitis    Use antihistamine and nasal saline daily, nasal steroids daily  Dermatitis    Having recurrent trouble around her nose. Has tried Benzoyl Peroxide, Differin, salicylic acid, Witch hazel and more. Continue Witch Hazel Astringent and add Benzaclin report if no imrpovement      Low back pain    With intermittent bilateral hip pain and occasional radicular symptoms down legs. Encouraged moist heat and gentle stretching as tolerated. May try NSAIDs and prescription meds as directed and report if symptoms worsen or seek immediate care. Encouraged Chiropractic and massage therapy. Report if ready for referral and imaging. Given rx for Tizanidine prn      Relevant Medications   tiZANidine (ZANAFLEX) 4 MG tablet   Osteopenia    Encouraged to get adequate exercise, calcium and vitamin d intake      Acid reflux    Notes cough after eating at  times. Avoid offending foods, start probiotics. Do not eat large meals in late evening and consider raising head of bed. Add Famotidine qhs report if symptoms do not improve.      Relevant Medications   famotidine (PEPCID) 40 MG tablet      I am having Katharine Look E. Cammarata start on clindamycin-benzoyl peroxide, tiZANidine, and famotidine. I am also having her maintain her OVER THE COUNTER MEDICATION, Multiple Vitamin (MULTI-VITAMIN DAILY PO), ALPRAZolam, CALCIUM CITRATE PO, ibuprofen, hydrocortisone cream, montelukast, venlafaxine XR, fluticasone, olopatadine, acyclovir ointment, nystatin ointment, albuterol, and fexofenadine.  Meds ordered this encounter  Medications  . fexofenadine (ALLEGRA) 180 MG tablet    Sig: Take 1 tablet (180 mg total) by mouth daily.    Dispense:  90 tablet    Refill:  1  . clindamycin-benzoyl peroxide (BENZACLIN) gel    Sig: Apply topically 2 (two) times daily.    Dispense:  25 g    Refill:  1  . tiZANidine (ZANAFLEX) 4 MG tablet    Sig: Take 0.5-1 tablets (2-4 mg total) by mouth 2 (two) times daily as needed for muscle spasms.    Dispense:  30 tablet    Refill:  1  . famotidine (PEPCID) 40 MG tablet    Sig: Take 1 tablet (40 mg total) by mouth at bedtime.    Dispense:  30 tablet    Refill:  2     I discussed the assessment and treatment plan with the patient. The patient was provided an opportunity to ask questions and all were answered. The patient agreed with the plan and demonstrated an understanding of the instructions.   The patient was advised to call back or seek an in-person evaluation if the symptoms worsen or if the condition fails to improve as anticipated.  I provided 25 minutes of non-face-to-face time during this encounter.   Penni Homans, MD

## 2019-11-02 NOTE — Assessment & Plan Note (Signed)
>>  ASSESSMENT AND PLAN FOR OTHER ALLERGIC RHINITIS WRITTEN ON 11/02/2019 10:52 AM BY BLYTH, STACEY A, MD  Use antihistamine and nasal saline daily, nasal steroids daily

## 2019-11-02 NOTE — Assessment & Plan Note (Signed)
Encouraged to get adequate exercise, calcium and vitamin d intake 

## 2019-11-08 MED FILL — FLUTICASONE PROP 50 MCG SPR: 50 | 30 days supply | Qty: 16 | Fill #2

## 2019-11-10 ENCOUNTER — Telehealth: Payer: Self-pay | Admitting: Family Medicine

## 2019-11-10 NOTE — Telephone Encounter (Signed)
LM to schedule follow up mid-January

## 2019-12-09 ENCOUNTER — Encounter (INDEPENDENT_AMBULATORY_CARE_PROVIDER_SITE_OTHER): Payer: No Typology Code available for payment source | Admitting: Ophthalmology

## 2019-12-21 MED FILL — FAMOTIDINE 40 MG TABLET: 40 | 30 days supply | Qty: 30 | Fill #1

## 2019-12-26 MED FILL — FLUTICASONE PROP 50 MCG SPR: 50 | 30 days supply | Qty: 16 | Fill #3

## 2019-12-31 ENCOUNTER — Ambulatory Visit (HOSPITAL_BASED_OUTPATIENT_CLINIC_OR_DEPARTMENT_OTHER)
Admission: RE | Admit: 2019-12-31 | Discharge: 2019-12-31 | Disposition: A | Discharge status: Home / Self Care | Payer: No Typology Code available for payment source | Source: Ambulatory Visit | Attending: Internal Medicine | Admitting: Internal Medicine

## 2019-12-31 ENCOUNTER — Ambulatory Visit: Payer: No Typology Code available for payment source | Admitting: Family Medicine

## 2019-12-31 ENCOUNTER — Other Ambulatory Visit: Payer: Self-pay

## 2019-12-31 ENCOUNTER — Ambulatory Visit (INDEPENDENT_AMBULATORY_CARE_PROVIDER_SITE_OTHER): Payer: No Typology Code available for payment source | Admitting: Internal Medicine

## 2019-12-31 DIAGNOSIS — R509 Fever, unspecified: Secondary | ICD-10-CM

## 2019-12-31 MED ORDER — AZITHROMYCIN 250 MG PO TABS: ORAL_TABLET | ORAL | 0 refills | Status: DC

## 2019-12-31 NOTE — Progress Notes (Signed)
Subjective:    Patient ID: Tricia Potts, female    DOB: [birth year], 49 y.o.   MRN: [id number]  DOS:  12/31/2019 Type of visit - description: Virtual Visit via Video Note  I connected with the via  enabled telemedicine application and verified that I am speaking with the correct person using two identifiers.   THIS ENCOUNTER IS A VIRTUAL VISIT DUE TO COVID-19 - PATIENT WAS NOT SEEN IN THE OFFICE. PATIENT HAS CONSENTED TO VIRTUAL VISIT / TELEMEDICINE VISIT   Location of patient: home  Location of provider: office  I discussed the limitations of evaluation and management by telemedicine and the availability of in person appointments. The patient expressed understanding and agreed to proceed.   Acute   On 12/14/2019 she got her first Covid vaccine, [employer]. She felt well until 12/25/2019 when she developed fever on and off, mild to moderate headache, and mild cough. She also has developed mild myalgias and some fatigue.  A Covid test was (-)  12/28/2019.   Review of Systems Denies any urinary symptoms No sinus congestion Cough is dry, no sputum production No chest pain no difficulty breathing O2 sat 96 to 100% No nausea, vomiting, diarrhea History of asthma, no wheezing.  Past Medical History:  Diagnosis Date  . Allergy   . Anemia 06/18/2014  . Anxiety   . Anxiety and depression 02/17/2009   Qualifier: Diagnosis of  By: Redmond Pulling MD, Frann Rider    . Asthma   . Broken ankle 2011   (left) roller skating  . Bronchitis, mucopurulent recurrent ([place]) 04/21/2013  . Cervical cancer screening 02/20/2016  . Hemorrhoid   . History of hidradenitis suppurativa   . Hyperlipemia   . IBS (irritable bowel syndrome)   . Low back pain 05/25/2016  . PCO (polycystic ovaries)   . Pruritus 08/22/2017    Past Surgical History:  Procedure Laterality Date  . ANKLE SURGERY     plate and 8 screws in left ankle  . HEMORRHOID SURGERY      Social History   Socioeconomic History  . Marital status:  Married    Spouse name: Not on file  . Number of children: Not on file  . Years of education: Not on file  . Highest education level: Not on file  Occupational History  . Not on file  Tobacco Use  . Smoking status: Never Smoker  . Smokeless tobacco: Never Used  Substance and Sexual Activity  . Alcohol use: No    Comment: vegetarian  . Drug use: No  . Sexual activity: Yes    Partners: Male    Birth control/protection: Other-see comments    Comment: vasectomy  Other Topics Concern  . Not on file  Social History Narrative  . Not on file   Social Determinants of Health   Financial Resource Strain:   . Difficulty of Paying Living Expenses: Not on file  Food Insecurity:   . Worried About Charity fundraiser in the Last Year: Not on file  . Ran Out of Food in the Last Year: Not on file  Transportation Needs:   . Lack of Transportation (Medical): Not on file  . Lack of Transportation (Non-Medical): Not on file  Physical Activity:   . Days of Exercise per Week: Not on file  . Minutes of Exercise per Session: Not on file  Stress:   . Feeling of Stress : Not on file  Social Connections:   . Frequency of Communication with Friends and Family:  Not on file  . Frequency of Social Gatherings with Friends and Family: Not on file  . Attends Religious Services: Not on file  . Active Member of Clubs or Organizations: Not on file  . Attends Archivist Meetings: Not on file  . Marital Status: Not on file  Intimate Partner Violence:   . Fear of Current or Ex-Partner: Not on file  . Emotionally Abused: Not on file  . Physically Abused: Not on file  . Sexually Abused: Not on file      Allergies as of 12/31/2019   No Known Allergies     Medication List       Accurate as of December 31, 2019 11:32 AM. If you have any questions, ask your nurse or doctor.        acyclovir ointment 5 % Commonly known as: Zovirax Apply 1 application topically every 3 (three) hours.     albuterol 108 (90 Base) MCG/ACT inhaler Commonly known as: VENTOLIN HFA Inhale 2 puffs into the lungs every 6 (six) hours as needed for wheezing or shortness of breath.   ALPRAZolam 0.25 MG tablet Commonly known as: XANAX Take 1 tablet (0.25 mg total) by mouth 2 (two) times daily as needed for anxiety.   CALCIUM CITRATE PO Take 2 tablets by mouth daily.   clindamycin-benzoyl peroxide gel Commonly known as: BenzaClin Apply topically 2 (two) times daily.   famotidine 40 MG tablet Commonly known as: PEPCID Take 1 tablet (40 mg total) by mouth at bedtime.   fexofenadine 180 MG tablet Commonly known as: ALLEGRA Take 1 tablet (180 mg total) by mouth daily.   fluticasone 50 MCG/ACT nasal spray Commonly known as: FLONASE PLACE 2 SPRAYS INTO THE NOSE DAILY   hydrocortisone cream 1 % Apply 1 application topically as needed for itching.   ibuprofen 200 MG tablet Commonly known as: ADVIL Take 400-800 mg by mouth every 6 (six) hours as needed for headache or mild pain.   montelukast 10 MG tablet Commonly known as: SINGULAIR Take 1 tablet (10 mg total) by mouth at bedtime as needed.   MULTI-VITAMIN DAILY PO Take by mouth daily.   nystatin ointment Commonly known as: MYCOSTATIN Apply 1 application topically 2 (two) times daily.   olopatadine 0.1 % ophthalmic solution Commonly known as: Pataday Place 1 drop into both eyes 2 (two) times daily.   OVER THE COUNTER MEDICATION Place 1 drop into both eyes as needed (allergy symptoms). OTC eye drops  Systane/Artificial Tears/Homeopathic eye drops   tiZANidine 4 MG tablet Commonly known as: Zanaflex Take 0.5-1 tablets (2-4 mg total) by mouth 2 (two) times daily as needed for muscle spasms.   venlafaxine XR 75 MG 24 hr capsule Commonly known as: Effexor XR Take 1 capsule (75 mg total) by mouth daily with breakfast.           Objective:   Physical Exam There were no vitals taken for this visit. This is a virtual video  visit, she is alert oriented x3, no apparent distress.     Assessment    49 year old female, history of asthma, allergies, GERD, high cholesterol, presents with:  Fever: The patient had her first Covid vaccine 12/14/2019, did well, started with low-grade fever and cough/myalgias 10 days later. Symptoms are mild but persistent. T-max 101.9, fever decreased with ibuprofen which she is taking sparingly. Review of system is negative, specifically no LUTS, chest pain or difficulty breathing.  O2 sat is very good. Covid testing (-) 3 days ago.  Etiology of fever, cough, myalgias unclear. Influenza?  Covid?  Late reaction to Covid vaccination?  [place]?. Plan: Chest x-ray today We will set up visit with a Covid clinic for tomorrow, I believe she needs to be retested, probably get PCR, check a UA but that will be at the criteria of the doctors over there. She asked about getting the a Covid vaccine booster on 01/04/2020, that will depend on the outcome of the visit tomorrow.  May need to consult ID as well. Addendum: Chest x-ray: Subtle   opacity of the bilateral lung bases, suspicious for infection. Spoke with the patient, will start Zithromax, recommend to keep the appointment with the [facility] clinic tomorrow.    I discussed the assessment and treatment plan with the patient. The patient was provided an opportunity to ask questions and all were answered. The patient agreed with the plan and demonstrated an understanding of the instructions.   The patient was advised to call back or seek an in-person evaluation if the symptoms worsen or if the condition fails to improve as anticipated.

## 2020-01-01 ENCOUNTER — Ambulatory Visit (INDEPENDENT_AMBULATORY_CARE_PROVIDER_SITE_OTHER): Payer: No Typology Code available for payment source | Admitting: Internal Medicine

## 2020-01-01 VITALS — BP 132/86 | HR 74 | Temp 99.2°F | Ht 61.0 in

## 2020-01-01 DIAGNOSIS — R059 Cough, unspecified: Secondary | ICD-10-CM

## 2020-01-01 DIAGNOSIS — J189 Pneumonia, unspecified organism: Secondary | ICD-10-CM | POA: Diagnosis not present

## 2020-01-01 DIAGNOSIS — R05 Cough: Secondary | ICD-10-CM | POA: Diagnosis not present

## 2020-01-01 DIAGNOSIS — R0682 Tachypnea, not elsewhere classified: Secondary | ICD-10-CM

## 2020-01-01 MED FILL — AZITHROMYCIN 250 MG TABLET: 250 | 5 days supply | Qty: 6 | Fill #0

## 2020-01-01 NOTE — Progress Notes (Signed)
Respiratory Clinic Note   Patient's initial symptoms began on Friday with a headache. This persisted over the weekend. Then on Monday, started having tachypnea, mild cough, body aches and fevers. Tmax 101.9. Otherwise temperatures ranging from normal to 100.6. Notes she starts to feel bad when temperature gets above 99 degrees.  Felt like she was getting worse so called PCP. CXR was ordered; results concerning for pneumonia in the bases. She was started on Azithromycin, has taken one dose.    Covid testing was completed on 1/4; results were negative. Current symptoms include Symptoms are stable, worse, better Symptomatic treatment includes: Ibuprofen, has been doing deep breathing exercises   Presence of significant medical comorbidities including any history of MI, stroke, hypertension, chronic kidney disease, chronic liver disease, emphysema, asthma, sleep apnea, diabetes, history of cancer, dyslipidemia, obesity and smoking was evaluated. Comorbidities present include: Asthma, Hyperlipidemia   Review of systems focused on signs and symptoms expected with Covid infection. Constitutional: Fever, fatigue, chills HEENT: Eye redness and discharge (conjunctivitis), nasal congestion, sore throat, anosmia, and altered taste Pulmonary: Cough nonproductive productive of sputum , dyspnea, tachycardia (pulse greater than 110), hemoptysis, chest pain, and tachypnea (respiratory rate greater than 22 GI: Anorexia, nausea, vomiting, diarrhea Genitourinary: Oliguria or anuria Skeletal: Myalgias Dermatologic: Rash Neurologic: Headache, dizziness, mental status changes Positive review of systems include: fever, cough, chest tightness/soreness, tachypnea, headaches, body aches   Physical exam:  Vitals:   01/01/20 1846  BP: 132/86  Pulse: 74  Temp: 99.2 F (37.3 C)  SpO2: 98%   Pertinent or positive findings: General appearance: Adequately nourished; no acute distress, increased work of breathing is  present.   Lymphatic: No lymphadenopathy about the head, neck, axilla. Eyes: No conjunctival inflammation or lid edema is present. There is no scleral icterus. Ears:  External ear exam shows no significant lesions or deformities.   Nose:  External nasal examination shows no deformity or inflammation. Nasal mucosa are pink and moist without lesions, exudates Oral exam:  Lips and gums are healthy appearing. There is no oropharyngeal erythema or exudate. Neck:  No thyromegaly, masses, tenderness noted.    Heart:  Normal rate and regular rhythm. S1 and S2 normal without gallop, murmur, click, rub .  Lungs: Trace rhonchorous breath sounds noted at bases bilaterally R>L. Lungs otherwise clear. Mild tachypnea noted. No retractions present. Patient speaking in full sentences.  Abdomen: Bowel sounds are normal. Abdomen is soft and nontender with no organomegaly, hernias, masses. GU: Deferred  Extremities:  No cyanosis, clubbing, edema  Neurologic exam : Normal tone present. Alert and oriented.  Skin: Warm & dry w/o tenting. No significant lesions or rash.  1. Pneumonia due to infectious organism, unspecified laterality, unspecified part of lung 2. Coughing 3. Tachypnea Patient presenting for multitude of symptoms that are potentially concerning for COVID-19 infection as well as influenza. Will repeat COVID testing in case first test initially false negative. Unable to test for flu at this clinic. While symptoms have been present >72 hours, offered Tamiflu for patient as benefits could potentially outweigh risks. Patient declined. Lung exam has some mild abnormalities and patient with minimal tachypnea. Has recently started treatment for bacterial Pneumonia with Azithromycin. Advised patient to call PCP if she decides she would like Rx for cough suppression. In meantime encouraged use of Mucinex and Tylenol. Discussed supportive care measures and deep inspiration/proning. Reasons to seek emergency care  reviewed.    Phill Myron, D.O. Primary [facility] at [facility]  01/01/2020, 7:24 PM

## 2020-01-01 NOTE — Patient Instructions (Addendum)
Testing/Procedures: Patient was given the COVID test today. Patient should check mychart for results.      COVID-19 COVID-19 is a respiratory infection that is caused by a virus called severe acute respiratory syndrome coronavirus 2 (SARS-CoV-2). The disease is also known as coronavirus disease or novel coronavirus. In some people, the virus may not cause any symptoms. In others, it may cause a serious infection. The infection can get worse quickly and can lead to complications, such as:  Pneumonia, or infection of the lungs.  Acute respiratory distress syndrome or ARDS. This is a condition in which fluid build-up in the lungs prevents the lungs from filling with air and passing oxygen into the blood.  Acute respiratory failure. This is a condition in which there is not enough oxygen passing from the lungs to the body or when carbon dioxide is not passing from the lungs out of the body.  Sepsis or septic shock. This is a serious bodily reaction to an infection.  Blood clotting problems.  Secondary infections due to bacteria or fungus.  Organ failure. This is when your body's organs stop working. The virus that causes COVID-19 is contagious. This means that it can spread from person to person through droplets from coughs and sneezes (respiratory secretions). What are the causes? This illness is caused by a virus. You may catch the virus by:  Breathing in droplets from an infected person. Droplets can be spread by a person breathing, speaking, singing, coughing, or sneezing.  Touching something, like a table or a doorknob, that was exposed to the virus (contaminated) and then touching your mouth, nose, or eyes. What increases the risk? Risk for infection You are more likely to be infected with this virus if you:  Are within 6 feet (2 meters) of a person with COVID-19.  Provide care for or live with a person who is infected with COVID-19.  Spend time in crowded indoor spaces or  live in shared housing. Risk for serious illness You are more likely to become seriously ill from the virus if you:  Are 70 years of age or older. The higher your age, the more you are at risk for serious illness.  Live in a nursing home or long-term care facility.  Have cancer.  Have a long-term (chronic) disease such as: ? Chronic lung disease, including chronic obstructive pulmonary disease or asthma. ? A long-term disease that lowers your body's ability to fight infection (immunocompromised). ? Heart disease, including heart failure, a condition in which the arteries that lead to the heart become narrow or blocked (coronary artery disease), a disease which makes the heart muscle thick, weak, or stiff (cardiomyopathy). ? Diabetes. ? Chronic kidney disease. ? Sickle cell disease, a condition in which red blood cells have an abnormal "sickle" shape. ? Liver disease.  Are obese. What are the signs or symptoms? Symptoms of this condition can range from mild to severe. Symptoms may appear any time from 2 to 14 days after being exposed to the virus. They include:  A fever or chills.  A cough.  Difficulty breathing.  Headaches, body aches, or muscle aches.  Runny or stuffy (congested) nose.  A sore throat.  New loss of taste or smell. Some people may also have stomach problems, such as nausea, vomiting, or diarrhea. Other people may not have any symptoms of COVID-19. How is this diagnosed? This condition may be diagnosed based on:  Your signs and symptoms, especially if: ? You live in an area with  a COVID-19 outbreak. ? You recently traveled to or from an area where the virus is common. ? You provide care for or live with a person who was diagnosed with COVID-19. ? You were exposed to a person who was diagnosed with COVID-19.  A physical exam.  Lab tests, which may include: ? Taking a sample of fluid from the back of your nose and throat (nasopharyngeal fluid), your  nose, or your throat using a swab. ? A sample of mucus from your lungs (sputum). ? Blood tests.  Imaging tests, which may include, X-rays, CT scan, or ultrasound. How is this treated? At present, there is no medicine to treat COVID-19. Medicines that treat other diseases are being used on a trial basis to see if they are effective against COVID-19. Your health care provider will talk with you about ways to treat your symptoms. For most people, the infection is mild and can be managed at home with rest, fluids, and over-the-counter medicines. Treatment for a serious infection usually takes places in a hospital intensive care unit (ICU). It may include one or more of the following treatments. These treatments are given until your symptoms improve.  Receiving fluids and medicines through an IV.  Supplemental oxygen. Extra oxygen is given through a tube in the nose, a face mask, or a hood.  Positioning you to lie on your stomach (prone position). This makes it easier for oxygen to get into the lungs.  Continuous positive airway pressure (CPAP) or bi-level positive airway pressure (BPAP) machine. This treatment uses mild air pressure to keep the airways open. A tube that is connected to a motor delivers oxygen to the body.  Ventilator. This treatment moves air into and out of the lungs by using a tube that is placed in your windpipe.  Tracheostomy. This is a procedure to create a hole in the neck so that a breathing tube can be inserted.  Extracorporeal membrane oxygenation (ECMO). This procedure gives the lungs a chance to recover by taking over the functions of the heart and lungs. It supplies oxygen to the body and removes carbon dioxide. Follow these instructions at home: Lifestyle  If you are sick, stay home except to get medical care. Your health care provider will tell you how long to stay home. Call your health care provider before you go for medical care.  Rest at home as told by your  health care provider.  Do not use any products that contain nicotine or tobacco, such as cigarettes, e-cigarettes, and chewing tobacco. If you need help quitting, ask your health care provider.  Return to your normal activities as told by your health care provider. Ask your health care provider what activities are safe for you. General instructions  Take over-the-counter and prescription medicines only as told by your health care provider.  Drink enough fluid to keep your urine pale yellow.  Keep all follow-up visits as told by your health care provider. This is important. How is this prevented?  There is no vaccine to help prevent COVID-19 infection. However, there are steps you can take to protect yourself and others from this virus. To protect yourself:   Do not travel to areas where COVID-19 is a risk. The areas where COVID-19 is reported change often. To identify high-risk areas and travel restrictions, check the CDC travel website: FatFares.com.br  If you live in, or must travel to, an area where COVID-19 is a risk, take precautions to avoid infection. ? Stay away from people  who are sick. ? Wash your hands often with soap and water for 20 seconds. If soap and water are not available, use an alcohol-based hand sanitizer. ? Avoid touching your mouth, face, eyes, or nose. ? Avoid going out in public, follow guidance from your state and local health authorities. ? If you must go out in public, wear a cloth face covering or face mask. Make sure your mask covers your nose and mouth. ? Avoid crowded indoor spaces. Stay at least 6 feet (2 meters) away from others. ? Disinfect objects and surfaces that are frequently touched every day. This may include:  Counters and tables.  Doorknobs and light switches.  Sinks and faucets.  Electronics, such as phones, remote controls, keyboards, computers, and tablets. To protect others: If you have symptoms of COVID-19, take steps  to prevent the virus from spreading to others.  If you think you have a COVID-19 infection, contact your health care provider right away. Tell your health care team that you think you may have a COVID-19 infection.  Stay home. Leave your house only to seek medical care. Do not use public transport.  Do not travel while you are sick.  Wash your hands often with soap and water for 20 seconds. If soap and water are not available, use alcohol-based hand sanitizer.  Stay away from other members of your household. Let healthy household members care for children and pets, if possible. If you have to care for children or pets, wash your hands often and wear a mask. If possible, stay in your own room, separate from others. Use a different bathroom.  Make sure that all people in your household wash their hands well and often.  Cough or sneeze into a tissue or your sleeve or elbow. Do not cough or sneeze into your hand or into the air.  Wear a cloth face covering or face mask. Make sure your mask covers your nose and mouth. Where to find more information  Centers for Disease Control and Prevention: PurpleGadgets.be  World Health Organization: https://www.castaneda.info/ Contact a health care provider if:  You live in or have traveled to an area where COVID-19 is a risk and you have symptoms of the infection.  You have had contact with someone who has COVID-19 and you have symptoms of the infection. Get help right away if:  You have trouble breathing.  You have pain or pressure in your chest.  You have confusion.  You have bluish lips and fingernails.  You have difficulty waking from sleep.  You have symptoms that get worse. These symptoms may represent a serious problem that is an emergency. Do not wait to see if the symptoms will go away. Get medical help right away. Call your local emergency services (911 in the [place] Do not drive yourself to the  hospital. Let the emergency medical personnel know if you think you have COVID-19. Summary  COVID-19 is a respiratory infection that is caused by a virus. It is also known as coronavirus disease or novel coronavirus. It can cause serious infections, such as pneumonia, acute respiratory distress syndrome, acute respiratory failure, or sepsis.  The virus that causes COVID-19 is contagious. This means that it can spread from person to person through droplets from breathing, speaking, singing, coughing, or sneezing.  You are more likely to develop a serious illness if you are 48 years of age or older, have a weak immune system, live in a nursing home, or have chronic disease.  There is  no medicine to treat COVID-19. Your health care provider will talk with you about ways to treat your symptoms.  Take steps to protect yourself and others from infection. Wash your hands often and disinfect objects and surfaces that are frequently touched every day. Stay away from people who are sick and wear a mask if you are sick. This information is not intended to replace advice given to you by your health care provider. Make sure you discuss any questions you have with your health care provider. Document Revised: 10/09/2019 Document Reviewed: 01/15/2019 Elsevier Patient Education  [place].

## 2020-01-03 LAB — NOVEL CORONAVIRUS, NAA: SARS-CoV-2, NAA: NOT DETECTED

## 2020-01-04 ENCOUNTER — Ambulatory Visit: Payer: Self-pay | Admitting: *Deleted

## 2020-01-04 DIAGNOSIS — R05 Cough: Secondary | ICD-10-CM

## 2020-01-04 DIAGNOSIS — R053 Chronic cough: Secondary | ICD-10-CM

## 2020-01-04 MED ORDER — BENZONATATE 200 MG PO CAPS: 200.0000 mg | ORAL_CAPSULE | Freq: Three times a day (TID) | ORAL | 0 refills | Status: DC | PRN

## 2020-01-04 MED ORDER — AMOXICILLIN-POT CLAVULANATE 875-125 MG PO TABS: 1.0000 | ORAL_TABLET | Freq: Two times a day (BID) | ORAL | 0 refills | Status: DC

## 2020-01-04 NOTE — Telephone Encounter (Signed)
Patient scheduled and order placed for cxr.

## 2020-01-04 NOTE — Telephone Encounter (Signed)
Pt called stating that she had had a virutla video with Dr. Larose Kells and was diagnosed with pneumonia after a CXR.  This illness started about 11 days ago.  She was started on zpack and now on the 4 th day. She feels like it is not working.she is still running a fever, this morning it was 102 at 2:40 am and now down to 96.8, she had opened up a window because she felt hot.  She reports she still have an elevated temp every night. But she did take 800 mg of Ibuprofen. She is drinking fluids.  She has a dry cough.  She went to the respiratory clinic on Friday. And she was tested for covid and is negative.  She would like a call back from Dr. Larose Kells to see if she may need another antibiotic or any other recommendation.  Routing to LB at [facility] for call back.   Reason for Disposition . [1] Taking antibiotic > 48 hours (2 days) AND [2] fever still present (SAME)  Answer Assessment - Initial Assessment Questions 1. INFECTION: "What infection is the antibiotic being given for?"     pneumonia 2. ANTIBIOTIC: "What antibiotic are you taking" "How many times per day?"     zpack 3. DURATION: "When was the antibiotic started?"     On the 8 th 4. MAIN CONCERN OR SYMPTOM:  "What is your main concern right now?"     Still has an elevated temp 5. BETTER-SAME-WORSE: "Are you getting better, staying the same, or getting worse compared to when you first started the antibiotics?" If getting worse, ask: "In what way?"      The same 6. FEVER: "Do you have a fever?" If so, ask: "What is your temperature, how was it measured, and when did it start?"     Not right now but this morning at 2:40 it was 102. 7. SYMPTOMS: "Are there any other symptoms you're concerned about?" If so, ask: "When did it start?"     no 8. FOLLOW-UP APPOINTMENT: "Do you have a follow-up appointment with your doctor?"     no  Protocols used: INFECTION ON ANTIBIOTIC FOLLOW-UP CALL-A-AH

## 2020-01-04 NOTE — Addendum Note (Signed)
Addended by: Kem Boroughs D on: 01/04/2020 04:51 PM   Modules accepted: Orders

## 2020-01-04 NOTE — Addendum Note (Signed)
Addended by: Kathlene November E on: 01/04/2020 04:43 PM   Modules accepted: Orders

## 2020-01-04 NOTE — Telephone Encounter (Signed)
Shekeita: Please enter order for chest x-ray: DX persistent cough Also, arrange a follow-up in the Covid/respiratory clinic for Wednesday.  === Spoke with the patient, Since the last visit she went to the Covid clinic, 01/01/2020, and all her Covid test came back negative. She continues with cough, + DOE.  No chest pain other than the one associated with cough. She also has fever up to 102.0 last night and 100.9 today. On ibuprofen 800 mg twice a day. O2 sat at home remains 100%.  Plan: Repeat chest x-ray Finish Zithromax, start Augmentin, Rx sent Tessalon Perles Schedule a follow-up at the a Covid/respiratory clinic this week. Unfortunately I cannot see her in my office

## 2020-01-05 ENCOUNTER — Ambulatory Visit (HOSPITAL_BASED_OUTPATIENT_CLINIC_OR_DEPARTMENT_OTHER)
Admission: RE | Admit: 2020-01-05 | Discharge: 2020-01-05 | Disposition: A | Discharge status: Home / Self Care | Payer: No Typology Code available for payment source | Source: Ambulatory Visit | Attending: Internal Medicine | Admitting: Internal Medicine

## 2020-01-05 ENCOUNTER — Other Ambulatory Visit: Payer: Self-pay

## 2020-01-05 DIAGNOSIS — R05 Cough: Secondary | ICD-10-CM | POA: Insufficient documentation

## 2020-01-05 DIAGNOSIS — R053 Chronic cough: Secondary | ICD-10-CM

## 2020-01-06 ENCOUNTER — Encounter: Payer: Self-pay | Admitting: Internal Medicine

## 2020-01-06 ENCOUNTER — Ambulatory Visit (INDEPENDENT_AMBULATORY_CARE_PROVIDER_SITE_OTHER): Payer: No Typology Code available for payment source | Admitting: Family Medicine

## 2020-01-06 VITALS — BP 130/90 | HR 100 | Temp 100.1°F | Resp 12 | Wt 165.0 lb

## 2020-01-06 DIAGNOSIS — R509 Fever, unspecified: Secondary | ICD-10-CM

## 2020-01-06 DIAGNOSIS — R0602 Shortness of breath: Secondary | ICD-10-CM | POA: Diagnosis not present

## 2020-01-06 DIAGNOSIS — J189 Pneumonia, unspecified organism: Secondary | ICD-10-CM | POA: Diagnosis not present

## 2020-01-06 MED ORDER — PREDNISONE 20 MG PO TABS: 40.0000 mg | ORAL_TABLET | Freq: Every day | ORAL | 0 refills | Status: DC

## 2020-01-06 MED ORDER — METHYLPREDNISOLONE ACETATE 40 MG/ML IJ SUSP: 40.0000 mg | Freq: Once | INTRAMUSCULAR | Status: DC

## 2020-01-06 MED ORDER — LEVOFLOXACIN 500 MG PO TABS: 500.0000 mg | ORAL_TABLET | Freq: Every day | ORAL | 0 refills | Status: DC

## 2020-01-06 NOTE — Progress Notes (Signed)
Patient ID: Tricia Potts, female    DOB: September 28, 1971, 49 y.o.   MRN: [id number]  PCP: Mosie Lukes, MD  Chief Complaint  Patient presents with  . Shortness of Breath    Subjective:  HPI  Tricia Potts is a 49 y.o. female presents for evaluation of shortness of breath, fever, persistent cough and recent diagnosis of Pneumonia. Patient reports she has had several negative COVID-19 test.  Her most recent negative COVID-19 test was on 12/31/2018.  She has continued to have daily fevers ranging from 99-<102 F.  Diagnosed with pneumonia initially on  and had a repeat chest x-ray on 12/30/2018 suspicious for Pneumonia. Her PCP repeated a chest x-ray 01/05/20 which revealed only bibasilar atelectasis. She has completed a course of Azithromycin. She is febrile today, 100.1. She continues to manage fever with tylenol. Current symptoms that persist are shortness of breath at rest, headache, cough, fatigue. PCP initiated a second round of antibiotic with Augmentin started on 01/04/20. She remains out of work due to fever. Albuterol is not improving breathing. She continues to remain tachypneic.  High risk for [id number] complications due to comorbid conditions of : Asthma Social History   Socioeconomic History  . Marital status: Married    Spouse name: Not on file  . Number of children: Not on file  . Years of education: Not on file  . Highest education level: Not on file  Occupational History  . Not on file  Tobacco Use  . Smoking status: Never Smoker  . Smokeless tobacco: Never Used  Substance and Sexual Activity  . Alcohol use: No    Comment: vegetarian  . Drug use: No  . Sexual activity: Yes    Partners: Male    Birth control/protection: Other-see comments    Comment: vasectomy  Other Topics Concern  . Not on file  Social History Narrative  . Not on file   Social Determinants of Health   Financial Resource Strain:   . Difficulty of Paying Living Expenses: Not on file  Food Insecurity:    . Worried About Charity fundraiser in the Last Year: Not on file  . Ran Out of Food in the Last Year: Not on file  Transportation Needs:   . Lack of Transportation (Medical): Not on file  . Lack of Transportation (Non-Medical): Not on file  Physical Activity:   . Days of Exercise per Week: Not on file  . Minutes of Exercise per Session: Not on file  Stress:   . Feeling of Stress : Not on file  Social Connections:   . Frequency of Communication with Friends and Family: Not on file  . Frequency of Social Gatherings with Friends and Family: Not on file  . Attends Religious Services: Not on file  . Active Member of Clubs or Organizations: Not on file  . Attends Archivist Meetings: Not on file  . Marital Status: Not on file  Intimate Partner Violence:   . Fear of Current or Ex-Partner: Not on file  . Emotionally Abused: Not on file  . Physically Abused: Not on file  . Sexually Abused: Not on file    Family History  Problem Relation Age of Onset  . Allergies Mother   . COPD Mother   . Heart disease Father        mitral valve disease/rupture during physical stress  . GER disease Father   . Irritable bowel syndrome Father   . Heart failure Father   .  Hepatitis C Brother   . Cancer Brother 17       ALL  . Heart disease Daughter        asd s/p repair at age 88  . Anxiety disorder Daughter   . Cancer Maternal Grandmother        ovarian cancer  . Heart disease Maternal Grandfather        MI at 59  . Kidney disease Paternal Grandfather        possible kidney cancer  . Allergies Sister   . Eczema Sister   . Cancer Sister 56       breast, DCIS  . Allergies Daughter   . Ovarian cancer Other        Grandmother  . Arthritis Other    Review of Systems Pertinent negatives listed in HPI Not on File  Prior to Admission medications   Medication Sig Start Date End Date Taking? Authorizing Provider  albuterol (VENTOLIN HFA) 108 (90 Base) MCG/ACT inhaler Inhale 2 puffs  into the lungs every 6 (six) hours as needed for wheezing or shortness of breath. 09/16/19  Yes Mosie Lukes, MD  ALPRAZolam Duanne Moron) 0.25 MG tablet Take 1 tablet (0.25 mg total) by mouth 2 (two) times daily as needed for anxiety. 12/12/17  Yes Mosie Lukes, MD  amoxicillin-clavulanate (AUGMENTIN) 875-125 MG tablet Take 1 tablet by mouth 2 (two) times daily. 01/04/20  Yes Paz, Alda Berthold, MD  azithromycin (ZITHROMAX Z-PAK) 250 MG tablet 2 tabs a day the first day, then 1 tab a day x 4 days 12/31/19  Yes Paz, Alda Berthold, MD  benzonatate (TESSALON) 200 MG capsule Take 1 capsule (200 mg total) by mouth 3 (three) times daily as needed for cough. 01/04/20  Yes Paz, Alda Berthold, MD  CALCIUM CITRATE PO Take 2 tablets by mouth daily.   Yes [provider]  clindamycin-benzoyl peroxide (BENZACLIN) gel Apply topically 2 (two) times daily. 10/30/19  Yes Mosie Lukes, MD  famotidine (PEPCID) 40 MG tablet Take 1 tablet (40 mg total) by mouth at bedtime. 10/30/19  Yes Mosie Lukes, MD  fexofenadine (ALLEGRA) 180 MG tablet Take 1 tablet (180 mg total) by mouth daily. 10/30/19  Yes Mosie Lukes, MD  fluticasone (FLONASE) 50 MCG/ACT nasal spray PLACE 2 SPRAYS INTO THE NOSE DAILY 04/06/19  Yes Mosie Lukes, MD  hydrocortisone cream 1 % Apply 1 application topically as needed for itching.   Yes [provider]  ibuprofen (ADVIL,MOTRIN) 200 MG tablet Take 400-800 mg by mouth every 6 (six) hours as needed for headache or mild pain.   Yes [provider]  montelukast (SINGULAIR) 10 MG tablet Take 1 tablet (10 mg total) by mouth at bedtime as needed. 12/29/18  Yes Mosie Lukes, MD  Multiple Vitamin (MULTI-VITAMIN DAILY PO) Take by mouth daily.   Yes [provider]  olopatadine (PATADAY) 0.1 % ophthalmic solution Place 1 drop into both eyes 2 (two) times daily. 04/06/19  Yes Mosie Lukes, MD  OVER THE COUNTER MEDICATION Place 1 drop into both eyes as needed (allergy symptoms). OTC eye drops   Systane/Artificial Tears/Homeopathic eye drops   Yes [provider]  tiZANidine (ZANAFLEX) 4 MG tablet Take 0.5-1 tablets (2-4 mg total) by mouth 2 (two) times daily as needed for muscle spasms. 10/30/19  Yes Mosie Lukes, MD  venlafaxine XR (EFFEXOR XR) 75 MG 24 hr capsule Take 1 capsule (75 mg total) by mouth daily with breakfast. 04/06/19  Yes Mosie Lukes, MD    Past Medical, Surgical Family and Social History reviewed and updated.    Objective:   Today's Vitals   01/06/20 1748  BP: 130/90  Pulse: 100  Resp: 12 REASSESSED 24 BREATHS/MIN (KSH)  Temp: 100.1 F (37.8 C)  SpO2: 98%  Weight: 165 lb (74.8 kg)    BP Readings from Last 3 Encounters:  01/06/20 130/90  01/01/20 132/86  12/26/18 (!) 139/93    Filed Weights   01/06/20 1748  Weight: 165 lb (74.8 kg)    Physical Exam Constitutional:      General: She is not in acute distress.    Appearance: She is normal weight. She is ill-appearing. She is not toxic-appearing or diaphoretic.  HENT:     Mouth/Throat:     Mouth: Mucous membranes are moist.     Pharynx: Oropharynx is clear.  Eyes:     Extraocular Movements: Extraocular movements intact.     Pupils: Pupils are equal, round, and reactive to light.  Cardiovascular:     Rate and Rhythm: Normal rate and regular rhythm.  Pulmonary:     Effort: Tachypnea and accessory muscle usage present.     Breath sounds: Examination of the right-upper field reveals rhonchi. Examination of the left-upper field reveals rhonchi. Rhonchi present. No wheezing.     Comments: Tachypnea and  pattern of breathing improved as exam progressed and was able to pattern breathing appropriately with lung auscultation exam  Musculoskeletal:        General: Normal range of motion.  Skin:    General: Skin is warm and dry.  Neurological:     Mental Status: She is alert and oriented to person, place, and time.  Psychiatric:        Mood and Affect: Mood is anxious.     Assessment  & Plan:  1. Pneumonia due to infectious organism, unspecified laterality, unspecified part of lung -Continue to experience fever and recent dx of Pneumonia. Repeat CXR 01/05/20 indicates bibasilar atelectasis without opacities or infiltrates noted on impression. Repeating COVID-19 test. Checking a CBC Discontinue Augmentin, will trial Levaquin 500 mg x 7 days  to resolve infection.  Recommend if symptoms persist, next step is Chest CT- which PCP can evaluate patient for the necessity of additional  imaging.  - CBC - Novel Coronavirus, NAA ([facility])  2. Fever, unspecified fever cause, recent negative COVID-19 test. No influenza testing. Consider influenza as a source of infection if COVID-19 test today is negative. Continue ibuprofen or tylenol for management of fever. CBC pending.  3. Shortness of breath Trial course of oral prednisone 40 mg daily x 5 days.  - methylPREDNISolone acetate (DEPO-MEDROL) injection 40 mg, administered in office today to improve work of breathing. Oxygen saturation >95%. No obstructive note on exam of lungs.  Continue albuterol as prescribed. Treating presumptive persistent PNA with Levaquin given fever persists >7 day. D/C Augmentin. If persists following prescribed treatments, recommend CT of Chest-will defer to PCP to order and manage if needed.  RTC: 1 week from today   Molli Barrows, [facility], PRN Provider  [facility]. [place], [facility] Phone: [phone] Clinic Fax: [phone] Clinic Hours: 5:30 pm -7:30 pm (Monday, Wednesday, and Friday)  -The patient was given clear instructions to go to ER or return to medical center if symptoms do not improve, worsen or new problems develop. The patient verbalized understanding.

## 2020-01-06 NOTE — Patient Instructions (Addendum)
I recommend CT of chest without contrast as a follow-up study given two x-rays indicate persistent Pneumonia. I would recommend CT study in 4-6 weeks.  Your PCP will have to order this test as it will require prior authorization for coverage through your insurance.   Your COVID 19 results will be available in 48-72 hours. Negative results are immediately resulted to Mychart. All positive results are communicated with a phone call from our office.   Acetaminophen 650 mg every 4-6 hours as needed for fever. Albuterol inhaler 2 puffs every 4-6 hours as needed for shortness of breath Discontinue Augmentin start Levaquin 1 tablet daily for 7 days. You will be seen in the clinic 1 week from today for reevaluation. Please follow-up with your primary care provider regarding your leave of work documentation.   Community-Acquired Pneumonia, Adult Pneumonia is an infection of the lungs. It causes swelling in the airways of the lungs. Mucus and fluid may also build up inside the airways. One type of pneumonia can happen while a person is in a hospital. A different type can happen when a person is not in a hospital (community-acquired pneumonia).  What are the causes?  This condition is caused by germs (viruses, bacteria, or fungi). Some types of germs can be passed from one person to another. This can happen when you breathe in droplets from the cough or sneeze of an infected person. What increases the risk? You are more likely to develop this condition if you:  Have a long-term (chronic) disease, such as: ? Chronic obstructive pulmonary disease (COPD). ? Asthma. ? Cystic fibrosis. ? Congestive heart failure. ? Diabetes. ? Kidney disease.  Have HIV.  Have sickle cell disease.  Have had your spleen removed.  Do not take good care of your teeth and mouth (poor dental hygiene).  Have a medical condition that increases the risk of breathing in droplets from your own mouth and nose.  Have a  weakened body defense system (immune system).  Are a smoker.  Travel to areas where the germs that cause this illness are common.  Are around certain animals or the places they live. What are the signs or symptoms?  A dry cough.  A wet (productive) cough.  Fever.  Sweating.  Chest pain. This often happens when breathing deeply or coughing.  Fast breathing or trouble breathing.  Shortness of breath.  Shaking chills.  Feeling tired (fatigue).  Muscle aches. How is this treated? Treatment for this condition depends on many things. Most adults can be treated at home. In some cases, treatment must happen in a hospital. Treatment may include:  Medicines given by mouth or through an IV tube.  Being given extra oxygen.  Respiratory therapy. In rare cases, treatment for very bad pneumonia may include:  Using a machine to help you breathe.  Having a procedure to remove fluid from around your lungs. Follow these instructions at home: Medicines  Take over-the-counter and prescription medicines only as told by your doctor. ? Only take cough medicine if you are losing sleep.  If you were prescribed an antibiotic medicine, take it as told by your doctor. Do not stop taking the antibiotic even if you start to feel better. General instructions   Sleep with your head and neck raised (elevated). You can do this by sleeping in a recliner or by putting a few pillows under your head.  Rest as needed. Get at least 8 hours of sleep each night.  Drink enough water to keep  your pee (urine) pale yellow.  Eat a healthy diet that includes plenty of vegetables, fruits, whole grains, low-fat dairy products, and lean protein.  Do not use any products that contain nicotine or tobacco. These include cigarettes, e-cigarettes, and chewing tobacco. If you need help quitting, ask your doctor.  Keep all follow-up visits as told by your doctor. This is important. How is this prevented? A shot  (vaccine) can help prevent pneumonia. Shots are often suggested for:  People older than 51 years of age.  People older than 51 years of age who: ? Are having cancer treatment. ? Have long-term (chronic) lung disease. ? Have problems with their body's defense system. You may also prevent pneumonia if you take these actions:  Get the flu (influenza) shot every year.  Go to the dentist as often as told.  Wash your hands often. If you cannot use soap and water, use hand sanitizer. Contact a doctor if:  You have a fever.  You lose sleep because your cough medicine does not help. Get help right away if:  You are short of breath and it gets worse.  You have more chest pain.  Your sickness gets worse. This is very serious if: ? You are an older adult. ? Your body's defense system is weak.  You cough up blood. Summary  Pneumonia is an infection of the lungs.  Most adults can be treated at home. Some will need treatment in a hospital.  Drink enough water to keep your pee pale yellow.  Get at least 8 hours of sleep each night. This information is not intended to replace advice given to you by your health care provider. Make sure you discuss any questions you have with your health care provider. Document Revised: 04/01/2019 Document Reviewed: 08/07/2018 Elsevier Patient Education  [place].

## 2020-01-07 LAB — SPECIMEN STATUS REPORT

## 2020-01-08 ENCOUNTER — Encounter: Payer: Self-pay | Admitting: Internal Medicine

## 2020-01-08 ENCOUNTER — Encounter: Payer: Self-pay | Admitting: Family Medicine

## 2020-01-08 ENCOUNTER — Other Ambulatory Visit: Payer: Self-pay | Admitting: Family Medicine

## 2020-01-08 ENCOUNTER — Telehealth: Payer: Self-pay | Admitting: Family Medicine

## 2020-01-08 LAB — NOVEL CORONAVIRUS, NAA: SARS-CoV-2, NAA: NOT DETECTED

## 2020-01-08 MED ORDER — FLUTICASONE-SALMETEROL 100-50 MCG/DOSE IN AEPB: 1.0000 | INHALATION_SPRAY | Freq: Two times a day (BID) | RESPIRATORY_TRACT | 1 refills | Status: DC

## 2020-01-08 MED FILL — ADVAIR 100/50 DISKUS: 100-50 | 30 days supply | Qty: 60 | Fill #0

## 2020-01-08 NOTE — Telephone Encounter (Signed)
Contact patient to inquire if her symptoms have improved from last visit to respiratory clinic. Her COVID-19 test remains negative and CBC show mild elevation in white blood count which is indicative of infection or inflammation. Complete medication and I'll her as scheduled next week.  Molli Barrows, FNP

## 2020-01-09 ENCOUNTER — Encounter: Payer: Self-pay | Admitting: Family Medicine

## 2020-01-09 LAB — CBC
Hematocrit: 42 % (ref 34.0–46.6)
Hemoglobin: 14.5 g/dL (ref 11.1–15.9)
MCH: 30.1 pg (ref 26.6–33.0)
MCHC: 34.5 g/dL (ref 31.5–35.7)
MCV: 87 fL (ref 79–97)
Platelets: 263 10*3/uL (ref 150–450)
RBC: 4.81 x10E6/uL (ref 3.77–5.28)
RDW: 13 % (ref 11.7–15.4)
WBC: 11.1 10*3/uL — ABNORMAL HIGH (ref 3.4–10.8)

## 2020-01-09 LAB — SPECIMEN STATUS REPORT

## 2020-01-11 ENCOUNTER — Telehealth: Payer: Self-pay

## 2020-01-11 ENCOUNTER — Encounter: Payer: Self-pay | Admitting: Family Medicine

## 2020-01-11 NOTE — Telephone Encounter (Signed)
Update/Respiratory Clinic  [place], Zeffie Kamper  You 10 minutes ago (11:01 AM)   Hi Jonelle Sidle,  I'm doing so much better! I will plan to be there on Wednesday at 5:30. Thank you.

## 2020-01-11 NOTE — Telephone Encounter (Signed)
I sent pt a mychart message and will await response.

## 2020-01-11 NOTE — Telephone Encounter (Signed)
I sent a mychart message to pt inquiring about her status. Will await response.

## 2020-01-13 ENCOUNTER — Encounter (INDEPENDENT_AMBULATORY_CARE_PROVIDER_SITE_OTHER): Payer: No Typology Code available for payment source | Admitting: Ophthalmology

## 2020-01-13 ENCOUNTER — Encounter: Payer: Self-pay | Admitting: Family Medicine

## 2020-01-13 ENCOUNTER — Ambulatory Visit (INDEPENDENT_AMBULATORY_CARE_PROVIDER_SITE_OTHER): Payer: No Typology Code available for payment source | Admitting: Family Medicine

## 2020-01-13 VITALS — BP 124/90 | HR 90 | Temp 98.6°F | Resp 14 | Ht 60.0 in | Wt 160.6 lb

## 2020-01-13 DIAGNOSIS — J189 Pneumonia, unspecified organism: Secondary | ICD-10-CM | POA: Diagnosis not present

## 2020-01-13 DIAGNOSIS — R0602 Shortness of breath: Secondary | ICD-10-CM | POA: Diagnosis not present

## 2020-01-13 DIAGNOSIS — R05 Cough: Secondary | ICD-10-CM | POA: Diagnosis not present

## 2020-01-13 DIAGNOSIS — R059 Cough, unspecified: Secondary | ICD-10-CM

## 2020-01-13 MED ORDER — PREDNISONE 20 MG PO TABS: 20.0000 mg | ORAL_TABLET | Freq: Every day | ORAL | 0 refills | Status: AC

## 2020-01-13 NOTE — Patient Instructions (Signed)
Follow-up with health department regarding direction as to when you should receive either a repeat series of the COVID-19 vaccine or if they recommend only a second vaccination once her symptoms resolve.  Follow-up with PCP regarding recommended CT scan. Respiratory symptoms do not improve within the next 2 weeks.  I am placing you on 5 days of low-dose prednisone to help improve respiratory symptoms.  I recommend continuing Advair and albuterol as indicated.   If any of your symptoms do not improve follow-up with your PCP for further direction.    Shortness of Breath, Adult Shortness of breath means you have trouble breathing. Shortness of breath could be a sign of a medical problem. Follow these instructions at home:   Watch for any changes in your symptoms.  Do not use any products that contain nicotine or tobacco, such as cigarettes, e-cigarettes, and chewing tobacco.  Do not smoke. Smoking can cause shortness of breath. If you need help to quit smoking, ask your doctor.  Avoid things that can make it harder to breathe, such as: ? Mold. ? Dust. ? Air pollution. ? Chemical smells. ? Things that can cause allergy symptoms (allergens), if you have allergies.  Keep your living space clean. Use products that help remove mold and dust.  Rest as needed. Slowly return to your normal activities.  Take over-the-counter and prescription medicines only as told by your doctor. This includes oxygen therapy and inhaled medicines.  Keep all follow-up visits as told by your doctor. This is important. Contact a doctor if:  Your condition does not get better as soon as expected.  You have a hard time doing your normal activities, even after you rest.  You have new symptoms. Get help right away if:  Your shortness of breath gets worse.  You have trouble breathing when you are resting.  You feel light-headed or you pass out (faint).  You have a cough that is not helped by medicines.   You cough up blood.  You have pain with breathing.  You have pain in your chest, arms, shoulders, or belly (abdomen).  You have a fever.  You cannot walk up stairs.  You cannot exercise the way you normally do. These symptoms may represent a serious problem that is an emergency. Do not wait to see if the symptoms will go away. Get medical help right away. Call your local emergency services (911 in the [place] Do not drive yourself to the hospital. Summary  Shortness of breath is when you have trouble breathing enough air. It can be a sign of a medical problem.  Avoid things that make it hard for you to breathe, such as smoking, pollution, mold, and dust.  Watch for any changes in your symptoms. Contact your doctor if you do not get better or you get worse. This information is not intended to replace advice given to you by your health care provider. Make sure you discuss any questions you have with your health care provider. Document Revised: 05/12/2018 Document Reviewed: 05/12/2018 Elsevier Patient Education  [place].

## 2020-01-13 NOTE — Progress Notes (Signed)
Patient ID: Tricia Potts, female    DOB: September 08, 1971, 49 y.o.   MRN: [id number]  PCP: Mosie Lukes, MD  Chief Complaint  Patient presents with  . Pneumonia    Subjective:  HPI  Initial Visit to Respiratory Clinic  Tricia Potts is a 49 y.o. female presents for evaluation of shortness of breath, fever, persistent cough and recent diagnosis of Pneumonia. Patient reports she has had several negative COVID-19 test.  Her most recent negative COVID-19 test was on 12/31/2018.  She has continued to have daily fevers ranging from 99-<102 F.  Diagnosed with pneumonia initially on  and had a repeat chest x-ray on 12/30/2018 suspicious for Pneumonia. Her PCP repeated a chest x-ray 01/05/20 which revealed only bibasilar atelectasis. She has completed a course of Azithromycin. She is febrile today, 100.1. She continues to manage fever with tylenol. Current symptoms that persist are shortness of breath at rest, headache, cough, fatigue. PCP initiated a second round of antibiotic with Augmentin started on 01/04/20. She remains out of work due to fever. Albuterol is not improving breathing. She continues to remain tachypneic.    Respiratory Clinic Follow-up 01/13/20 Patient presents today she has return to work since her last visit here in the respiratory clinic last week.  She completed Levaquin and also completed a burst of prednisone and initially felt great.  She reports after working a full day on yesterday and upon awakening early this morning she developed symptoms of worsening shortness of breath nonproductive cough, chest tightness, level 8, and had a low-grade temp this morning of 99.7.  She took Tylenol earlier and has not been febrile for the remainder of the day.  She endorses significant improvement with recent treatment although has not returned to baseline.  She is also using albuterol 3 times per day to improve both her breathing and her primary care recently added Advair twice daily to improve  symptoms.  Social History   Socioeconomic History  . Marital status: Married    Spouse name: Not on file  . Number of children: Not on file  . Years of education: Not on file  . Highest education level: Not on file  Occupational History  . Not on file  Tobacco Use  . Smoking status: Never Smoker  . Smokeless tobacco: Never Used  Substance and Sexual Activity  . Alcohol use: No    Comment: vegetarian  . Drug use: No  . Sexual activity: Yes    Partners: Male    Birth control/protection: Other-see comments    Comment: vasectomy  Other Topics Concern  . Not on file  Social History Narrative  . Not on file   Social Determinants of Health   Financial Resource Strain:   . Difficulty of Paying Living Expenses: Not on file  Food Insecurity:   . Worried About Charity fundraiser in the Last Year: Not on file  . Ran Out of Food in the Last Year: Not on file  Transportation Needs:   . Lack of Transportation (Medical): Not on file  . Lack of Transportation (Non-Medical): Not on file  Physical Activity:   . Days of Exercise per Week: Not on file  . Minutes of Exercise per Session: Not on file  Stress:   . Feeling of Stress : Not on file  Social Connections:   . Frequency of Communication with Friends and Family: Not on file  . Frequency of Social Gatherings with Friends and Family: Not on file  .  Attends Religious Services: Not on file  . Active Member of Clubs or Organizations: Not on file  . Attends Archivist Meetings: Not on file  . Marital Status: Not on file  Intimate Partner Violence:   . Fear of Current or Ex-Partner: Not on file  . Emotionally Abused: Not on file  . Physically Abused: Not on file  . Sexually Abused: Not on file    Family History  Problem Relation Age of Onset  . Allergies Mother   . COPD Mother   . Heart disease Father        mitral valve disease/rupture during physical stress  . GER disease Father   . Irritable bowel syndrome  Father   . Heart failure Father   . Hepatitis C Brother   . Cancer Brother 17       ALL  . Heart disease Daughter        asd s/p repair at age 83  . Anxiety disorder Daughter   . Cancer Maternal Grandmother        ovarian cancer  . Heart disease Maternal Grandfather        MI at 6  . Kidney disease Paternal Grandfather        possible kidney cancer  . Allergies Sister   . Eczema Sister   . Cancer Sister 19       breast, DCIS  . Allergies Daughter   . Ovarian cancer Other        Grandmother  . Arthritis Other    Review of Systems Pertinent negatives listed in HPI Not on File  Prior to Admission medications   Medication Sig Start Date End Date Taking? Authorizing Provider  albuterol (VENTOLIN HFA) 108 (90 Base) MCG/ACT inhaler Inhale 2 puffs into the lungs every 6 (six) hours as needed for wheezing or shortness of breath. 09/16/19  Yes Mosie Lukes, MD  ALPRAZolam Duanne Moron) 0.25 MG tablet Take 1 tablet (0.25 mg total) by mouth 2 (two) times daily as needed for anxiety. 12/12/17  Yes Mosie Lukes, MD  amoxicillin-clavulanate (AUGMENTIN) 875-125 MG tablet Take 1 tablet by mouth 2 (two) times daily. 01/04/20  Yes Paz, Alda Berthold, MD  azithromycin (ZITHROMAX Z-PAK) 250 MG tablet 2 tabs a day the first day, then 1 tab a day x 4 days 12/31/19  Yes Paz, Alda Berthold, MD  benzonatate (TESSALON) 200 MG capsule Take 1 capsule (200 mg total) by mouth 3 (three) times daily as needed for cough. 01/04/20  Yes Paz, Alda Berthold, MD  CALCIUM CITRATE PO Take 2 tablets by mouth daily.   Yes [provider]  clindamycin-benzoyl peroxide (BENZACLIN) gel Apply topically 2 (two) times daily. 10/30/19  Yes Mosie Lukes, MD  famotidine (PEPCID) 40 MG tablet Take 1 tablet (40 mg total) by mouth at bedtime. 10/30/19  Yes Mosie Lukes, MD  fexofenadine (ALLEGRA) 180 MG tablet Take 1 tablet (180 mg total) by mouth daily. 10/30/19  Yes Mosie Lukes, MD  fluticasone (FLONASE) 50 MCG/ACT nasal spray PLACE 2  SPRAYS INTO THE NOSE DAILY 04/06/19  Yes Mosie Lukes, MD  hydrocortisone cream 1 % Apply 1 application topically as needed for itching.   Yes [provider]  ibuprofen (ADVIL,MOTRIN) 200 MG tablet Take 400-800 mg by mouth every 6 (six) hours as needed for headache or mild pain.   Yes [provider]  montelukast (SINGULAIR) 10 MG tablet Take 1 tablet (10 mg total) by mouth at bedtime as  needed. 12/29/18  Yes Mosie Lukes, MD  Multiple Vitamin (MULTI-VITAMIN DAILY PO) Take by mouth daily.   Yes [provider]  olopatadine (PATADAY) 0.1 % ophthalmic solution Place 1 drop into both eyes 2 (two) times daily. 04/06/19  Yes Mosie Lukes, MD  OVER THE COUNTER MEDICATION Place 1 drop into both eyes as needed (allergy symptoms). OTC eye drops  Systane/Artificial Tears/Homeopathic eye drops   Yes [provider]  tiZANidine (ZANAFLEX) 4 MG tablet Take 0.5-1 tablets (2-4 mg total) by mouth 2 (two) times daily as needed for muscle spasms. 10/30/19  Yes Mosie Lukes, MD  venlafaxine XR (EFFEXOR XR) 75 MG 24 hr capsule Take 1 capsule (75 mg total) by mouth daily with breakfast. 04/06/19  Yes Mosie Lukes, MD    Past Medical, Surgical Family and Social History reviewed and updated.    Objective:   Blood pressure 124/90, pulse 90, temperature 98.6 F (37 C), resp. rate 14, height 5' (1.524 m), weight 160 lb 9.6 oz (72.8 kg), SpO2 97 %.  Physical Exam Constitutional:      Appearance: She is normal weight.  HENT:     Head: Normocephalic.  Cardiovascular:     Rate and Rhythm: Normal rate.  Pulmonary:     Breath sounds: Decreased air movement present. No wheezing, rhonchi or rales.  Skin:    General: Skin is warm and dry.  Neurological:     Mental Status: She is alert and oriented to person, place, and time.  Psychiatric:        Mood and Affect: Mood normal.    Assessment & Plan:  1. Pneumonia due to infectious organism, unspecified laterality,  unspecified part of lung - Afebrile today. Continues to have residual SOB and cough.  -Completed Azithromycin and Levaquin  2. Shortness of breath  -Improved initially with treatment, however, continues to struggle with increase of work of breathing with activity. -Adding a burst of prednisone 20 mg once daily x 5 days -Continue Albuterol and Advair  3. Coughing -Continue benzonatate , along with inhalers   If symptoms have not improved, recommend PCP order CT. Regarding COVID-19 Vaccine deferred patient to Health at Work or Health Department regarding waiting period to complete series or will she require re-vaccination give recent illness.  -The patient was given clear instructions to go to ER or return to medical center if symptoms do not improve, worsen or new problems develop. The patient verbalized understanding.   Molli Barrows, FNP-C [facility], PRN Provider  [facility]. [place], [place] [facility] Phone: [phone] Clinic Fax: [phone] Clinic Hours: 5:30 pm -7:30 pm (Monday, Wednesday, and Friday)

## 2020-01-13 NOTE — Progress Notes (Deleted)
Patient ID: Tricia Potts, female    DOB: [birth year], 49 y.o.   MRN: [id number]  PCP: Mosie Lukes, MD  No chief complaint on file.   Subjective:  HPI  Tricia Potts is a 49 y.o. female presents for evaluation    Social History   Socioeconomic History  . Marital status: Married    Spouse name: Not on file  . Number of children: Not on file  . Years of education: Not on file  . Highest education level: Not on file  Occupational History  . Not on file  Tobacco Use  . Smoking status: Never Smoker  . Smokeless tobacco: Never Used  Substance and Sexual Activity  . Alcohol use: No    Comment: vegetarian  . Drug use: No  . Sexual activity: Yes    Partners: Male    Birth control/protection: Other-see comments    Comment: vasectomy  Other Topics Concern  . Not on file  Social History Narrative  . Not on file   Social Determinants of Health   Financial Resource Strain:   . Difficulty of Paying Living Expenses: Not on file  Food Insecurity:   . Worried About Charity fundraiser in the Last Year: Not on file  . Ran Out of Food in the Last Year: Not on file  Transportation Needs:   . Lack of Transportation (Medical): Not on file  . Lack of Transportation (Non-Medical): Not on file  Physical Activity:   . Days of Exercise per Week: Not on file  . Minutes of Exercise per Session: Not on file  Stress:   . Feeling of Stress : Not on file  Social Connections:   . Frequency of Communication with Friends and Family: Not on file  . Frequency of Social Gatherings with Friends and Family: Not on file  . Attends Religious Services: Not on file  . Active Member of Clubs or Organizations: Not on file  . Attends Archivist Meetings: Not on file  . Marital Status: Not on file  Intimate Partner Violence:   . Fear of Current or Ex-Partner: Not on file  . Emotionally Abused: Not on file  . Physically Abused: Not on file  . Sexually Abused: Not on file    Family  History  Problem Relation Age of Onset  . Allergies Mother   . COPD Mother   . Heart disease Father        mitral valve disease/rupture during physical stress  . GER disease Father   . Irritable bowel syndrome Father   . Heart failure Father   . Hepatitis C Brother   . Cancer Brother 17       ALL  . Heart disease Daughter        asd s/p repair at age 63  . Anxiety disorder Daughter   . Cancer Maternal Grandmother        ovarian cancer  . Heart disease Maternal Grandfather        MI at 34  . Kidney disease Paternal Grandfather        possible kidney cancer  . Allergies Sister   . Eczema Sister   . Cancer Sister 73       breast, DCIS  . Allergies Daughter   . Ovarian cancer Other        Grandmother  . Arthritis Other      Review of Systems  Patient Active Problem List   Diagnosis Date Noted  .  Osteopenia 11/02/2019  . Acid reflux 11/02/2019  . Perimenopause 04/06/2019  . Low back pain 05/25/2016  . Cervical cancer screening 02/20/2016  . Anemia 06/18/2014  . Preventative health care 04/23/2013  . ASTHMA 01/22/2011  . Hyperlipidemia, mixed 02/17/2009  . Anxiety and depression 02/17/2009  . HEMORRHOIDS 02/17/2009  . Allergic rhinitis 02/17/2009  . IRRITABLE BOWEL SYNDROME 02/17/2009  . Dermatitis 02/17/2009    Not on File  Prior to Admission medications   Medication Sig Start Date End Date Taking? Authorizing Provider  albuterol (VENTOLIN HFA) 108 (90 Base) MCG/ACT inhaler Inhale 2 puffs into the lungs every 6 (six) hours as needed for wheezing or shortness of breath. 09/16/19  Yes Mosie Lukes, MD  ALPRAZolam Duanne Moron) 0.25 MG tablet Take 1 tablet (0.25 mg total) by mouth 2 (two) times daily as needed for anxiety. 12/12/17  Yes Mosie Lukes, MD  azithromycin (ZITHROMAX Z-PAK) 250 MG tablet 2 tabs a day the first day, then 1 tab a day x 4 days 12/31/19  Yes Paz, Alda Berthold, MD  benzonatate (TESSALON) 200 MG capsule Take 1 capsule (200 mg total) by mouth 3 (three)  times daily as needed for cough. 01/04/20  Yes Paz, Alda Berthold, MD  CALCIUM CITRATE PO Take 2 tablets by mouth daily.   Yes [provider]  clindamycin-benzoyl peroxide (BENZACLIN) gel Apply topically 2 (two) times daily. 10/30/19  Yes Mosie Lukes, MD  famotidine (PEPCID) 40 MG tablet Take 1 tablet (40 mg total) by mouth at bedtime. 10/30/19  Yes Mosie Lukes, MD  fexofenadine (ALLEGRA) 180 MG tablet Take 1 tablet (180 mg total) by mouth daily. 10/30/19  Yes Mosie Lukes, MD  fluticasone (FLONASE) 50 MCG/ACT nasal spray PLACE 2 SPRAYS INTO THE NOSE DAILY 04/06/19  Yes Mosie Lukes, MD  Fluticasone-Salmeterol (ADVAIR) 100-50 MCG/DOSE AEPB Inhale 1 puff into the lungs 2 (two) times daily. 01/08/20  Yes Mosie Lukes, MD  hydrocortisone cream 1 % Apply 1 application topically as needed for itching.   Yes [provider]  ibuprofen (ADVIL,MOTRIN) 200 MG tablet Take 400-800 mg by mouth every 6 (six) hours as needed for headache or mild pain.   Yes [provider]  montelukast (SINGULAIR) 10 MG tablet Take 1 tablet (10 mg total) by mouth at bedtime as needed. 12/29/18  Yes Mosie Lukes, MD  Multiple Vitamin (MULTI-VITAMIN DAILY PO) Take by mouth daily.   Yes [provider]  olopatadine (PATADAY) 0.1 % ophthalmic solution Place 1 drop into both eyes 2 (two) times daily. 04/06/19  Yes Mosie Lukes, MD  OVER THE COUNTER MEDICATION Place 1 drop into both eyes as needed (allergy symptoms). OTC eye drops  Systane/Artificial Tears/Homeopathic eye drops   Yes [provider]  tiZANidine (ZANAFLEX) 4 MG tablet Take 0.5-1 tablets (2-4 mg total) by mouth 2 (two) times daily as needed for muscle spasms. 10/30/19  Yes Mosie Lukes, MD  venlafaxine XR (EFFEXOR XR) 75 MG 24 hr capsule Take 1 capsule (75 mg total) by mouth daily with breakfast. 04/06/19  Yes Mosie Lukes, MD    Past Medical, Surgical Family and Social History reviewed and updated.     Objective:   Today's Vitals   01/13/20 1732  BP: 124/90  Pulse: 90  Resp: 14  Temp: 98.6 F (37 C)  SpO2: 97%  Weight: 160 lb 9.6 oz (72.8 kg)  Height: 5' (1.524 m)    Wt Readings from Last 3 Encounters:  01/13/20 160 lb 9.6 oz (72.8 kg)  01/06/20 165 lb (74.8 kg)  10/30/19 158 lb (71.7 kg)     Physical Exam General appearance: alert, well developed, well nourished, cooperative and in no distress Head: Normocephalic, without obvious abnormality, atraumatic Respiratory: Respirations even and unlabored, normal respiratory rate Heart: rate and rhythm normal. No gallop or murmurs noted on exam  Extremities: No gross deformities Skin: Skin color, texture, turgor normal. No rashes seen  Psych: Appropriate mood and affect. Neurologic: Mental status: Alert, oriented to person, place, and time, thought content appropriate.           Assessment & Plan:  There are no diagnoses linked to this encounter.     -The patient was given clear instructions to go to ER or return to medical center if symptoms do not improve, worsen or new problems develop. The patient verbalized understanding.     Carroll Sage. Kenton Kingfisher, FNP-C Nurse Practitioner (PRN Staff)  Primary Care at [facility] [address] [place], [place]

## 2020-01-14 ENCOUNTER — Ambulatory Visit: Payer: No Typology Code available for payment source | Admitting: Family Medicine

## 2020-01-15 ENCOUNTER — Ambulatory Visit (INDEPENDENT_AMBULATORY_CARE_PROVIDER_SITE_OTHER): Payer: No Typology Code available for payment source | Admitting: Family Medicine

## 2020-01-15 ENCOUNTER — Other Ambulatory Visit: Payer: Self-pay

## 2020-01-15 DIAGNOSIS — J45909 Unspecified asthma, uncomplicated: Secondary | ICD-10-CM | POA: Diagnosis not present

## 2020-01-15 DIAGNOSIS — J209 Acute bronchitis, unspecified: Secondary | ICD-10-CM | POA: Diagnosis not present

## 2020-01-15 MED ORDER — DOXYCYCLINE HYCLATE 100 MG PO TABS: 100.0000 mg | ORAL_TABLET | Freq: Two times a day (BID) | ORAL | 0 refills | Status: DC

## 2020-01-15 NOTE — Progress Notes (Signed)
Virtual Visit via phone Note  I connected with Tricia Potts on 01/15/20 at  8:00 AM EST by a phone enabled telemedicine application and verified that I am speaking with the correct person using two identifiers.  Location: Patient: home Provider: home   I discussed the limitations of evaluation and management by telemedicine and the availability of in person appointments. The patient expressed understanding and agreed to proceed. Tricia Potts, CMA was able to get the patient set upon a visit, phone after being unable to set up a video visit.    Subjective:    Patient ID: [name], female    DOB: Dec 26, 1970, 49 y.o.   MRN: [id number]  No chief complaint on file.   HPI Patient is in today for reevaluation of persistent respiratory infection. She has been seen in respiratory clinic and was already treated with Zpak and [place]. When she was on Levaquin her fever came down but when she stopped it it returned to a Tmax of 100.9. it did respond to Tylenol. She still struggling with cough, malaise, fatigue. She has some SOB but it is much better than it was. She continues to use Advair bid and Albuterol prn. Denies CP/palp/HA/GI or GU c/o. Taking meds as prescribed  Past Medical History:  Diagnosis Date  . Allergy   . Anemia 06/18/2014  . Anxiety   . Anxiety and depression 02/17/2009   Qualifier: Diagnosis of  By: Redmond Pulling MD, Frann Rider    . Asthma   . Broken ankle 2011   (left) roller skating  . Bronchitis, mucopurulent recurrent ([place]) 04/21/2013  . Cervical cancer screening 02/20/2016  . Hemorrhoid   . History of hidradenitis suppurativa   . Hyperlipemia   . IBS (irritable bowel syndrome)   . Low back pain 05/25/2016  . PCO (polycystic ovaries)   . Pruritus 08/22/2017    Past Surgical History:  Procedure Laterality Date  . ANKLE SURGERY     plate and 8 screws in left ankle  . HEMORRHOID SURGERY      Family History  Problem Relation Age of Onset  . Allergies Mother   . COPD  Mother   . Heart disease Father        mitral valve disease/rupture during physical stress  . GER disease Father   . Irritable bowel syndrome Father   . Heart failure Father   . Hepatitis C Brother   . Cancer Brother 17       ALL  . Heart disease Daughter        asd s/p repair at age 53  . Anxiety disorder Daughter   . Cancer Maternal Grandmother        ovarian cancer  . Heart disease Maternal Grandfather        MI at 69  . Kidney disease Paternal Grandfather        possible kidney cancer  . Allergies Sister   . Eczema Sister   . Cancer Sister 47       breast, DCIS  . Allergies Daughter   . Ovarian cancer Other        Grandmother  . Arthritis Other     Social History   Socioeconomic History  . Marital status: Married    Spouse name: Not on file  . Number of children: Not on file  . Years of education: Not on file  . Highest education level: Not on file  Occupational History  . Not on file  Tobacco Use  .  Smoking status: Never Smoker  . Smokeless tobacco: Never Used  Substance and Sexual Activity  . Alcohol use: No    Comment: vegetarian  . Drug use: No  . Sexual activity: Yes    Partners: Male    Birth control/protection: Other-see comments    Comment: vasectomy  Other Topics Concern  . Not on file  Social History Narrative  . Not on file   Social Determinants of Health   Financial Resource Strain:   . Difficulty of Paying Living Expenses: Not on file  Food Insecurity:   . Worried About Charity fundraiser in the Last Year: Not on file  . Ran Out of Food in the Last Year: Not on file  Transportation Needs:   . Lack of Transportation (Medical): Not on file  . Lack of Transportation (Non-Medical): Not on file  Physical Activity:   . Days of Exercise per Week: Not on file  . Minutes of Exercise per Session: Not on file  Stress:   . Feeling of Stress : Not on file  Social Connections:   . Frequency of Communication with Friends and Family: Not on file   . Frequency of Social Gatherings with Friends and Family: Not on file  . Attends Religious Services: Not on file  . Active Member of Clubs or Organizations: Not on file  . Attends Archivist Meetings: Not on file  . Marital Status: Not on file  Intimate Partner Violence:   . Fear of Current or Ex-Partner: Not on file  . Emotionally Abused: Not on file  . Physically Abused: Not on file  . Sexually Abused: Not on file    Outpatient Medications Prior to Visit  Medication Sig Dispense Refill  . albuterol (VENTOLIN HFA) 108 (90 Base) MCG/ACT inhaler Inhale 2 puffs into the lungs every 6 (six) hours as needed for wheezing or shortness of breath. 18 g 2  . ALPRAZolam (XANAX) 0.25 MG tablet Take 1 tablet (0.25 mg total) by mouth 2 (two) times daily as needed for anxiety. 40 tablet 1  . benzonatate (TESSALON) 200 MG capsule Take 1 capsule (200 mg total) by mouth 3 (three) times daily as needed for cough. 30 capsule 0  . CALCIUM CITRATE PO Take 2 tablets by mouth daily.    . clindamycin-benzoyl peroxide (BENZACLIN) gel Apply topically 2 (two) times daily. 25 g 1  . famotidine (PEPCID) 40 MG tablet Take 1 tablet (40 mg total) by mouth at bedtime. 30 tablet 2  . fexofenadine (ALLEGRA) 180 MG tablet Take 1 tablet (180 mg total) by mouth daily. 90 tablet 1  . fluticasone (FLONASE) 50 MCG/ACT nasal spray PLACE 2 SPRAYS INTO THE NOSE DAILY 16 g 8  . Fluticasone-Salmeterol (ADVAIR) 100-50 MCG/DOSE AEPB Inhale 1 puff into the lungs 2 (two) times daily. 1 each 1  . hydrocortisone cream 1 % Apply 1 application topically as needed for itching.    Marland Kitchen ibuprofen (ADVIL,MOTRIN) 200 MG tablet Take 400-800 mg by mouth every 6 (six) hours as needed for headache or mild pain.    . montelukast (SINGULAIR) 10 MG tablet Take 1 tablet (10 mg total) by mouth at bedtime as needed. 90 tablet 3  . Multiple Vitamin (MULTI-VITAMIN DAILY PO) Take by mouth daily.    Marland Kitchen olopatadine (PATADAY) 0.1 % ophthalmic solution  Place 1 drop into both eyes 2 (two) times daily. 5 mL 12  . OVER THE COUNTER MEDICATION Place 1 drop into both eyes as needed (allergy symptoms). OTC  eye drops  Systane/Artificial Tears/Homeopathic eye drops    . predniSONE (DELTASONE) 20 MG tablet Take 1 tablet (20 mg total) by mouth daily with breakfast for 5 days. 5 tablet 0  . tiZANidine (ZANAFLEX) 4 MG tablet Take 0.5-1 tablets (2-4 mg total) by mouth 2 (two) times daily as needed for muscle spasms. 30 tablet 1  . venlafaxine XR (EFFEXOR XR) 75 MG 24 hr capsule Take 1 capsule (75 mg total) by mouth daily with breakfast. 90 capsule 2  . azithromycin (ZITHROMAX Z-PAK) 250 MG tablet 2 tabs a day the first day, then 1 tab a day x 4 days 6 tablet 0  . methylPREDNISolone acetate (DEPO-MEDROL) injection 40 mg      No facility-administered medications prior to visit.    Not on File  Review of Systems  Constitutional: Positive for fever and malaise/fatigue.  HENT: Positive for congestion.   Eyes: Negative for blurred vision.  Respiratory: Positive for cough. Negative for shortness of breath.   Cardiovascular: Negative for chest pain, palpitations and leg swelling.  Gastrointestinal: Negative for abdominal pain, blood in stool and nausea.  Genitourinary: Negative for dysuria and frequency.  Musculoskeletal: Positive for myalgias. Negative for falls.  Skin: Negative for rash.  Neurological: Negative for dizziness, loss of consciousness and headaches.  Endo/Heme/Allergies: Negative for environmental allergies.  Psychiatric/Behavioral: Negative for depression. The patient is not nervous/anxious.        Objective:    Physical Exam unable to obtain via phone  There were no vitals taken for this visit. Wt Readings from Last 3 Encounters:  01/13/20 160 lb 9.6 oz (72.8 kg)  01/06/20 165 lb (74.8 kg)  10/30/19 158 lb (71.7 kg)    Diabetic Foot Exam - Simple   No data filed     Lab Results  Component Value Date   WBC 11.1 (H)  01/06/2020   HGB 14.5 01/06/2020   HCT 42.0 01/06/2020   PLT 263 01/06/2020   GLUCOSE 80 09/19/2018   CHOL 247 (H) 09/19/2018   TRIG 165.0 (H) 09/19/2018   HDL 60.90 09/19/2018   LDLDIRECT 186.0 08/14/2016   LDLCALC 154 (H) 09/19/2018   ALT 13 09/19/2018   AST 14 09/19/2018   NA 138 09/19/2018   K 4.1 09/19/2018   CL 104 09/19/2018   CREATININE 0.67 09/19/2018   BUN 12 09/19/2018   CO2 26 09/19/2018   TSH 2.98 09/19/2018    Lab Results  Component Value Date   TSH 2.98 09/19/2018   Lab Results  Component Value Date   WBC 11.1 (H) 01/06/2020   HGB 14.5 01/06/2020   HCT 42.0 01/06/2020   MCV 87 01/06/2020   PLT 263 01/06/2020   Lab Results  Component Value Date   NA 138 09/19/2018   K 4.1 09/19/2018   CO2 26 09/19/2018   GLUCOSE 80 09/19/2018   BUN 12 09/19/2018   CREATININE 0.67 09/19/2018   BILITOT 0.4 09/19/2018   ALKPHOS 61 09/19/2018   AST 14 09/19/2018   ALT 13 09/19/2018   PROT 7.1 09/19/2018   ALBUMIN 4.2 09/19/2018   CALCIUM 9.1 09/19/2018   GFR 99.20 09/19/2018   Lab Results  Component Value Date   CHOL 247 (H) 09/19/2018   Lab Results  Component Value Date   HDL 60.90 09/19/2018   Lab Results  Component Value Date   LDLCALC 154 (H) 09/19/2018   Lab Results  Component Value Date   TRIG 165.0 (H) 09/19/2018   Lab Results  Component  Value Date   CHOLHDL 4 09/19/2018   No results found for: HGBA1C     Assessment & Plan:   Problem List Items Addressed This Visit    Bronchitis with asthma, subacute    She was on Levaquin but the day she finished it her fevers returned so she is placed on Doxycyline. She will finish the course of Prednisone she was given by respiratory clinic and continue Mucinex, probiotics, tessalon perles prn. Encouraged increased rest and hydration, add probiotics, zinc such as Coldeze or Xicam. Treat fevers as needed. Out of work til at least 1/26 and based on response to treatment and being afebrile. Recent CXR at  respiratory clinic is reviewed and shows no mass or obvious pneumonia.       Asthma    Continue Albuterol and Advair, consider increasing strength of Advair if symptoms persist         I have discontinued Ninive Obara. Esteban's azithromycin. I am also having her start on doxycycline. Additionally, I am having her maintain her OVER THE COUNTER MEDICATION, Multiple Vitamin (MULTI-VITAMIN DAILY PO), ALPRAZolam, CALCIUM CITRATE PO, ibuprofen, hydrocortisone cream, montelukast, venlafaxine XR, fluticasone, olopatadine, albuterol, fexofenadine, clindamycin-benzoyl peroxide, tiZANidine, famotidine, benzonatate, Fluticasone-Salmeterol, and predniSONE. We will stop administering methylPREDNISolone acetate.  Meds ordered this encounter  Medications  . doxycycline (VIBRA-TABS) 100 MG tablet    Sig: Take 1 tablet (100 mg total) by mouth 2 (two) times daily.    Dispense:  20 tablet    Refill:  0     I discussed the assessment and treatment plan with the patient. The patient was provided an opportunity to ask questions and all were answered. The patient agreed with the plan and demonstrated an understanding of the instructions.   The patient was advised to call back or seek an in-person evaluation if the symptoms worsen or if the condition fails to improve as anticipated.  I provided 15 minutes of non-face-to-face time during this encounter.   Penni Homans, MD

## 2020-01-15 NOTE — Assessment & Plan Note (Signed)
Continue Albuterol and Advair, consider increasing strength of Advair if symptoms persist

## 2020-01-15 NOTE — Assessment & Plan Note (Addendum)
She was on Levaquin but the day she finished it her fevers returned so she is placed on Doxycyline. She will finish the course of Prednisone she was given by respiratory clinic and continue Mucinex, probiotics, tessalon perles prn. Encouraged increased rest and hydration, add probiotics, zinc such as Coldeze or Xicam. Treat fevers as needed. Out of work til at least 1/26 and based on response to treatment and being afebrile. Recent CXR at respiratory clinic is reviewed and shows no mass or obvious pneumonia.

## 2020-01-18 ENCOUNTER — Telehealth: Payer: Self-pay

## 2020-01-18 DIAGNOSIS — Z1152 Encounter for screening for COVID-19: Secondary | ICD-10-CM

## 2020-01-18 DIAGNOSIS — Z0000 Encounter for general adult medical examination without abnormal findings: Secondary | ICD-10-CM

## 2020-01-18 DIAGNOSIS — E782 Mixed hyperlipidemia: Secondary | ICD-10-CM

## 2020-01-18 MED ORDER — FLUTICASONE-SALMETEROL 250-50 MCG/DOSE IN AEPB: 1.0000 | INHALATION_SPRAY | Freq: Two times a day (BID) | RESPIRATORY_TRACT | 1 refills | Status: DC

## 2020-01-18 NOTE — Telephone Encounter (Signed)
Change her Advair to 250/50, 1 puff po bid and keep her out of work this week if she is willing and have her do a follow up visit Friday or Monday to reassess and make sure she gets looked at if she feels worse

## 2020-01-18 NOTE — Telephone Encounter (Signed)
Tricia Potts called in to let Dr. Charlett Blake know that the medication she is on is still making her wheezy and shortness of breath.  Tricia Potts  is seeking advised to when she can return back to work.Please follow up with the Tricia Potts at [phone] thanks.

## 2020-01-18 NOTE — Telephone Encounter (Signed)
Please advise 

## 2020-01-19 ENCOUNTER — Other Ambulatory Visit: Payer: Self-pay

## 2020-01-19 ENCOUNTER — Other Ambulatory Visit (INDEPENDENT_AMBULATORY_CARE_PROVIDER_SITE_OTHER): Payer: No Typology Code available for payment source

## 2020-01-19 DIAGNOSIS — Z0279 Encounter for issue of other medical certificate: Secondary | ICD-10-CM

## 2020-01-19 DIAGNOSIS — Z0000 Encounter for general adult medical examination without abnormal findings: Secondary | ICD-10-CM | POA: Diagnosis not present

## 2020-01-19 DIAGNOSIS — E782 Mixed hyperlipidemia: Secondary | ICD-10-CM

## 2020-01-19 DIAGNOSIS — Z1152 Encounter for screening for COVID-19: Secondary | ICD-10-CM

## 2020-01-19 LAB — COMPREHENSIVE METABOLIC PANEL
ALT: 36 U/L — ABNORMAL HIGH (ref 0–35)
AST: 21 U/L (ref 0–37)
Albumin: 3.8 g/dL (ref 3.5–5.2)
Alkaline Phosphatase: 96 U/L (ref 39–117)
BUN: 13 mg/dL (ref 6–23)
CO2: 29 mEq/L (ref 19–32)
Calcium: 9.1 mg/dL (ref 8.4–10.5)
Chloride: 103 mEq/L (ref 96–112)
Creatinine, Ser: 0.68 mg/dL (ref 0.40–1.20)
GFR: 91.26 mL/min (ref >60.00)
Glucose, Bld: 83 mg/dL (ref 70–99)
Potassium: 3.9 mEq/L (ref 3.5–5.1)
Sodium: 139 mEq/L (ref 135–145)
Total Bilirubin: 0.3 mg/dL (ref 0.2–1.2)
Total Protein: 7 g/dL (ref 6.0–8.3)

## 2020-01-19 LAB — LDL CHOLESTEROL, DIRECT: Direct LDL: 128 mg/dL

## 2020-01-19 LAB — CBC
HCT: 42.7 % (ref 36.0–46.0)
Hemoglobin: 14.3 g/dL (ref 12.0–15.0)
MCHC: 33.4 g/dL (ref 30.0–36.0)
MCV: 90.6 fl (ref 78.0–100.0)
Platelets: 117 10*3/uL — ABNORMAL LOW (ref 150.0–400.0)
RBC: 4.72 Mil/uL (ref 3.87–5.11)
RDW: 13.9 % (ref 11.5–15.5)
WBC: 9.5 10*3/uL (ref 4.0–10.5)

## 2020-01-19 LAB — LIPID PANEL
Cholesterol: 225 mg/dL — ABNORMAL HIGH (ref 0–200)
HDL: 49.8 mg/dL (ref >39.00)
Total CHOL/HDL Ratio: 5
Triglycerides: 472 mg/dL — ABNORMAL HIGH (ref 0.0–149.0)

## 2020-01-19 LAB — TSH: TSH: 2.91 u[IU]/mL (ref 0.35–4.50)

## 2020-01-20 LAB — SARS-COV-2 ANTIBODY, IGM: SARS-CoV-2 Antibody, IgM: NEGATIVE

## 2020-01-20 NOTE — Telephone Encounter (Signed)
Spoke with patient and she stated that [facility] had already addressed this with her.

## 2020-01-21 ENCOUNTER — Encounter: Payer: Self-pay | Admitting: Family Medicine

## 2020-01-22 MED ORDER — ATORVASTATIN CALCIUM 10 MG PO TABS: 10.0000 mg | ORAL_TABLET | Freq: Every day | ORAL | 3 refills | Status: DC

## 2020-01-22 NOTE — Addendum Note (Signed)
Addended by: [name] A on: 01/22/2020 02:49 PM   Modules accepted: Orders

## 2020-01-27 ENCOUNTER — Other Ambulatory Visit: Payer: Self-pay | Admitting: Family Medicine

## 2020-01-27 ENCOUNTER — Encounter: Payer: Self-pay | Admitting: Family Medicine

## 2020-01-27 MED FILL — VENLAFAXINE HCL ER 75 MG CA: 75 | 90 days supply | Qty: 90 | Fill #0

## 2020-01-31 MED FILL — FAMOTIDINE 40 MG TABLET: 40 | 30 days supply | Qty: 30 | Fill #2

## 2020-02-04 NOTE — Telephone Encounter (Signed)
[employer] Disability documents completed, faxed to [phone]. Patient informed.

## 2020-02-12 ENCOUNTER — Ambulatory Visit (INDEPENDENT_AMBULATORY_CARE_PROVIDER_SITE_OTHER): Payer: No Typology Code available for payment source | Admitting: Family Medicine

## 2020-02-12 ENCOUNTER — Other Ambulatory Visit: Payer: Self-pay

## 2020-02-12 ENCOUNTER — Encounter: Payer: Self-pay | Admitting: Family Medicine

## 2020-02-12 VITALS — BP 121/85 | HR 67 | Wt 159.0 lb

## 2020-02-12 DIAGNOSIS — R739 Hyperglycemia, unspecified: Secondary | ICD-10-CM

## 2020-02-12 DIAGNOSIS — F419 Anxiety disorder, unspecified: Secondary | ICD-10-CM | POA: Diagnosis not present

## 2020-02-12 DIAGNOSIS — J45909 Unspecified asthma, uncomplicated: Secondary | ICD-10-CM

## 2020-02-12 DIAGNOSIS — E782 Mixed hyperlipidemia: Secondary | ICD-10-CM | POA: Diagnosis not present

## 2020-02-12 DIAGNOSIS — J209 Acute bronchitis, unspecified: Secondary | ICD-10-CM | POA: Diagnosis not present

## 2020-02-12 DIAGNOSIS — K219 Gastro-esophageal reflux disease without esophagitis: Secondary | ICD-10-CM

## 2020-02-12 DIAGNOSIS — F329 Major depressive disorder, single episode, unspecified: Secondary | ICD-10-CM

## 2020-02-12 MED ORDER — VENLAFAXINE HCL ER 75 MG PO CP24: 75.0000 mg | ORAL_CAPSULE | Freq: Every day | ORAL | 1 refills | Status: DC

## 2020-02-12 MED ORDER — FEXOFENADINE HCL 180 MG PO TABS: 180.0000 mg | ORAL_TABLET | Freq: Every day | ORAL | 1 refills | Status: DC

## 2020-02-12 MED ORDER — FAMOTIDINE 40 MG PO TABS: 40.0000 mg | ORAL_TABLET | Freq: Every day | ORAL | 1 refills | Status: DC

## 2020-02-12 MED ORDER — FLUTICASONE PROPIONATE 50 MCG/ACT NA SUSP: NASAL | 1 refills | Status: DC

## 2020-02-12 MED FILL — FLUTICASONE PROP 50 MCG SPR: 50 | 90 days supply | Qty: 48 | Fill #0

## 2020-02-12 MED FILL — FEXOFENADINE HCL 180 MG TAB: 180 | 90 days supply | Qty: 90 | Fill #0

## 2020-02-12 NOTE — Progress Notes (Signed)
Virtual Visit via Video Note  I connected with Tricia Potts on 02/14/20 at  8:20 AM EST by a video enabled telemedicine application and verified that I am speaking with the correct person using two identifiers.  Location: Patient: home Provider: home   I discussed the limitations of evaluation and management by telemedicine and the availability of in person appointments. The patient expressed understanding and agreed to proceed. Tricia Potts, CMA was able to get the patient set up on a visit, video   Subjective:    Patient ID: Tricia Potts, female    DOB: May 06, 1971, 49 y.o.   MRN: [id number]  Chief Complaint  Patient presents with  . Follow-up    HPI Patient is in today for follow up on chronic medical concerns. She notes her respiratory illness has improved greatly. She has been able to return to work and has done fairly well over the past 2 weeks. Can still cough or get sob but is nearly resolved. She is most concerned about stress and anxiety mostly due to struggles with illness and at home. She is managing well but is going to work with therapy to manage. Denies CP/palp/HA/congestion/fevers/GI or GU c/o. Taking meds as prescribed  Past Medical History:  Diagnosis Date  . Allergy   . Anemia 06/18/2014  . Anxiety   . Anxiety and depression 02/17/2009   Qualifier: Diagnosis of  By: Redmond Pulling MD, Frann Rider    . Asthma   . Broken ankle 2011   (left) roller skating  . Bronchitis, mucopurulent recurrent ([place]) 04/21/2013  . Cervical cancer screening 02/20/2016  . Hemorrhoid   . History of hidradenitis suppurativa   . Hyperlipemia   . IBS (irritable bowel syndrome)   . Low back pain 05/25/2016  . PCO (polycystic ovaries)   . Pruritus 08/22/2017    Past Surgical History:  Procedure Laterality Date  . ANKLE SURGERY     plate and 8 screws in left ankle  . HEMORRHOID SURGERY      Family History  Problem Relation Age of Onset  . Allergies Mother   . COPD Mother   . Heart disease  Father        mitral valve disease/rupture during physical stress  . GER disease Father   . Irritable bowel syndrome Father   . Heart failure Father   . Hepatitis C Brother   . Cancer Brother 17       ALL  . Heart disease Daughter        asd s/p repair at age 1  . Anxiety disorder Daughter   . Cancer Maternal Grandmother        ovarian cancer  . Heart disease Maternal Grandfather        MI at 77  . Kidney disease Paternal Grandfather        possible kidney cancer  . Allergies Sister   . Eczema Sister   . Cancer Sister 11       breast, DCIS  . Allergies Daughter   . Ovarian cancer Other        Grandmother  . Arthritis Other     Social History   Socioeconomic History  . Marital status: Married    Spouse name: Not on file  . Number of children: Not on file  . Years of education: Not on file  . Highest education level: Not on file  Occupational History  . Not on file  Tobacco Use  . Smoking status: Never Smoker  .  Smokeless tobacco: Never Used  Substance and Sexual Activity  . Alcohol use: No    Comment: vegetarian  . Drug use: No  . Sexual activity: Yes    Partners: Male    Birth control/protection: Other-see comments    Comment: vasectomy  Other Topics Concern  . Not on file  Social History Narrative  . Not on file   Social Determinants of Health   Financial Resource Strain:   . Difficulty of Paying Living Expenses: Not on file  Food Insecurity:   . Worried About Charity fundraiser in the Last Year: Not on file  . Ran Out of Food in the Last Year: Not on file  Transportation Needs:   . Lack of Transportation (Medical): Not on file  . Lack of Transportation (Non-Medical): Not on file  Physical Activity:   . Days of Exercise per Week: Not on file  . Minutes of Exercise per Session: Not on file  Stress:   . Feeling of Stress : Not on file  Social Connections:   . Frequency of Communication with Friends and Family: Not on file  . Frequency of Social  Gatherings with Friends and Family: Not on file  . Attends Religious Services: Not on file  . Active Member of Clubs or Organizations: Not on file  . Attends Archivist Meetings: Not on file  . Marital Status: Not on file  Intimate Partner Violence:   . Fear of Current or Ex-Partner: Not on file  . Emotionally Abused: Not on file  . Physically Abused: Not on file  . Sexually Abused: Not on file    Outpatient Medications Prior to Visit  Medication Sig Dispense Refill  . albuterol (VENTOLIN HFA) 108 (90 Base) MCG/ACT inhaler Inhale 2 puffs into the lungs every 6 (six) hours as needed for wheezing or shortness of breath. 18 g 2  . ALPRAZolam (XANAX) 0.25 MG tablet Take 1 tablet (0.25 mg total) by mouth 2 (two) times daily as needed for anxiety. 40 tablet 1  . atorvastatin (LIPITOR) 10 MG tablet Take 1 tablet (10 mg total) by mouth daily. 90 tablet 3  . benzonatate (TESSALON) 200 MG capsule Take 1 capsule (200 mg total) by mouth 3 (three) times daily as needed for cough. 30 capsule 0  . CALCIUM CITRATE PO Take 2 tablets by mouth daily.    . clindamycin-benzoyl peroxide (BENZACLIN) gel Apply topically 2 (two) times daily. 25 g 1  . doxycycline (VIBRA-TABS) 100 MG tablet Take 1 tablet (100 mg total) by mouth 2 (two) times daily. 20 tablet 0  . Fluticasone-Salmeterol (ADVAIR DISKUS) 250-50 MCG/DOSE AEPB Inhale 1 puff into the lungs 2 (two) times daily. 1 each 1  . hydrocortisone cream 1 % Apply 1 application topically as needed for itching.    Marland Kitchen ibuprofen (ADVIL,MOTRIN) 200 MG tablet Take 400-800 mg by mouth every 6 (six) hours as needed for headache or mild pain.    . montelukast (SINGULAIR) 10 MG tablet Take 1 tablet (10 mg total) by mouth at bedtime as needed. 90 tablet 3  . Multiple Vitamin (MULTI-VITAMIN DAILY PO) Take by mouth daily.    Marland Kitchen olopatadine (PATADAY) 0.1 % ophthalmic solution Place 1 drop into both eyes 2 (two) times daily. 5 mL 12  . OVER THE COUNTER MEDICATION Place 1  drop into both eyes as needed (allergy symptoms). OTC eye drops  Systane/Artificial Tears/Homeopathic eye drops    . tiZANidine (ZANAFLEX) 4 MG tablet Take 0.5-1 tablets (2-4 mg  total) by mouth 2 (two) times daily as needed for muscle spasms. 30 tablet 1  . famotidine (PEPCID) 40 MG tablet Take 1 tablet (40 mg total) by mouth at bedtime. 30 tablet 2  . fexofenadine (ALLEGRA) 180 MG tablet Take 1 tablet (180 mg total) by mouth daily. 90 tablet 1  . fluticasone (FLONASE) 50 MCG/ACT nasal spray PLACE 2 SPRAYS INTO THE NOSE DAILY 16 g 8  . venlafaxine XR (EFFEXOR-XR) 75 MG 24 hr capsule TAKE 1 CAPSULE (75 MG TOTAL) BY MOUTH DAILY WITH BREAKFAST. 90 capsule 1   No facility-administered medications prior to visit.    Not on File  Review of Systems  Constitutional: Positive for malaise/fatigue. Negative for fever.  HENT: Negative for congestion.   Eyes: Negative for blurred vision.  Respiratory: Positive for cough. Negative for shortness of breath.   Cardiovascular: Positive for chest pain. Negative for palpitations and leg swelling.  Gastrointestinal: Negative for abdominal pain, blood in stool and nausea.  Genitourinary: Negative for dysuria and frequency.  Musculoskeletal: Negative for falls.  Skin: Negative for rash.  Neurological: Negative for dizziness, loss of consciousness and headaches.  Endo/Heme/Allergies: Negative for environmental allergies.  Psychiatric/Behavioral: Positive for depression. The patient is nervous/anxious.        Objective:    Physical Exam Constitutional:      Appearance: Normal appearance. She is not ill-appearing.  HENT:     Head: Normocephalic and atraumatic.     Right Ear: External ear normal.     Left Ear: External ear normal.     Nose: Nose normal.  Eyes:     General:        Right eye: No discharge.        Left eye: No discharge.  Pulmonary:     Effort: Pulmonary effort is normal.  Neurological:     Mental Status: She is alert and oriented to  person, place, and time.  Psychiatric:        Behavior: Behavior normal.     BP 121/85   Pulse 67   Wt 159 lb (72.1 kg)   SpO2 97%   BMI 31.05 kg/m  Wt Readings from Last 3 Encounters:  02/12/20 159 lb (72.1 kg)  01/13/20 160 lb 9.6 oz (72.8 kg)  01/06/20 165 lb (74.8 kg)    Diabetic Foot Exam - Simple   No data filed     Lab Results  Component Value Date   WBC 9.5 01/19/2020   HGB 14.3 01/19/2020   HCT 42.7 01/19/2020   PLT 117.0 (L) 01/19/2020   GLUCOSE 83 01/19/2020   CHOL 225 (H) 01/19/2020   TRIG (H) 01/19/2020    472.0 Triglyceride is over 400; calculations on Lipids are invalid.   HDL 49.80 01/19/2020   LDLDIRECT 128.0 01/19/2020   LDLCALC 154 (H) 09/19/2018   ALT 36 (H) 01/19/2020   AST 21 01/19/2020   NA 139 01/19/2020   K 3.9 01/19/2020   CL 103 01/19/2020   CREATININE 0.68 01/19/2020   BUN 13 01/19/2020   CO2 29 01/19/2020   TSH 2.91 01/19/2020    Lab Results  Component Value Date   TSH 2.91 01/19/2020   Lab Results  Component Value Date   WBC 9.5 01/19/2020   HGB 14.3 01/19/2020   HCT 42.7 01/19/2020   MCV 90.6 01/19/2020   PLT 117.0 (L) 01/19/2020   Lab Results  Component Value Date   NA 139 01/19/2020   K 3.9 01/19/2020   CO2 29  01/19/2020   GLUCOSE 83 01/19/2020   BUN 13 01/19/2020   CREATININE 0.68 01/19/2020   BILITOT 0.3 01/19/2020   ALKPHOS 96 01/19/2020   AST 21 01/19/2020   ALT 36 (H) 01/19/2020   PROT 7.0 01/19/2020   ALBUMIN 3.8 01/19/2020   CALCIUM 9.1 01/19/2020   GFR 91.26 01/19/2020   Lab Results  Component Value Date   CHOL 225 (H) 01/19/2020   Lab Results  Component Value Date   HDL 49.80 01/19/2020   Lab Results  Component Value Date   LDLCALC 154 (H) 09/19/2018   Lab Results  Component Value Date   TRIG (H) 01/19/2020    472.0 Triglyceride is over 400; calculations on Lipids are invalid.   Lab Results  Component Value Date   CHOLHDL 5 01/19/2020   No results found for: HGBA1C       Assessment & Plan:   Problem List Items Addressed This Visit    Hyperlipidemia, mixed - Primary   Relevant Orders   Lipid panel   Anxiety and depression    Effexor has been helping but with her recent illness and stressors at home. We may consider increasing effexor if needed for now she will use therapy to manage      Relevant Medications   venlafaxine XR (EFFEXOR-XR) 75 MG 24 hr capsule   Bronchitis with asthma, subacute    Much better and she has returned to work. She is managing well but if she over exerts she can still get tight.       Acid reflux    Avoid offending foods, start probiotics. Do not eat large meals in late evening and consider raising head of bed.       Relevant Medications   famotidine (PEPCID) 40 MG tablet   Elevated blood sugar     minimize simple carbs. Increase exercise as tolerated.      Relevant Orders   Hemoglobin A1c   CBC   Comprehensive metabolic panel   TSH      I am having Katharine Look E. [place] maintain her OVER THE COUNTER MEDICATION, Multiple Vitamin (MULTI-VITAMIN DAILY PO), ALPRAZolam, CALCIUM CITRATE PO, ibuprofen, hydrocortisone cream, montelukast, olopatadine, albuterol, clindamycin-benzoyl peroxide, tiZANidine, benzonatate, doxycycline, Fluticasone-Salmeterol, atorvastatin, fexofenadine, fluticasone, venlafaxine XR, and famotidine.  Meds ordered this encounter  Medications  . fexofenadine (ALLEGRA) 180 MG tablet    Sig: Take 1 tablet (180 mg total) by mouth daily.    Dispense:  90 tablet    Refill:  1  . fluticasone (FLONASE) 50 MCG/ACT nasal spray    Sig: PLACE 2 SPRAYS INTO THE NOSE DAILY    Dispense:  48 g    Refill:  1  . venlafaxine XR (EFFEXOR-XR) 75 MG 24 hr capsule    Sig: Take 1 capsule (75 mg total) by mouth daily with breakfast.    Dispense:  90 capsule    Refill:  1  . famotidine (PEPCID) 40 MG tablet    Sig: Take 1 tablet (40 mg total) by mouth at bedtime.    Dispense:  90 tablet    Refill:  1     Penni Homans,  MD    I discussed the assessment and treatment plan with the patient. The patient was provided an opportunity to ask questions and all were answered. The patient agreed with the plan and demonstrated an understanding of the instructions.   The patient was advised to call back or seek an in-person evaluation if the symptoms worsen or if the  condition fails to improve as anticipated.  I provided 25 minutes of non-face-to-face time during this encounter.   Penni Homans, MD

## 2020-02-12 NOTE — Assessment & Plan Note (Addendum)
Effexor has been helping but with her recent illness and stressors at home. We may consider increasing effexor if needed for now she will use therapy to manage

## 2020-02-14 DIAGNOSIS — R739 Hyperglycemia, unspecified: Secondary | ICD-10-CM | POA: Insufficient documentation

## 2020-02-14 NOTE — Assessment & Plan Note (Signed)
Much better and she has returned to work. She is managing well but if she over exerts she can still get tight.

## 2020-02-14 NOTE — Assessment & Plan Note (Signed)
Avoid offending foods, start probiotics. Do not eat large meals in late evening and consider raising head of bed.  

## 2020-02-14 NOTE — Assessment & Plan Note (Signed)
minimize simple carbs. Increase exercise as tolerated.  

## 2020-02-15 ENCOUNTER — Telehealth: Payer: Self-pay | Admitting: *Deleted

## 2020-02-15 NOTE — Telephone Encounter (Signed)
Form sent over from [facility] disability requesting office notes for patient form January 2021.  Office notes faxed.

## 2020-02-22 ENCOUNTER — Encounter: Payer: Self-pay | Admitting: Family Medicine

## 2020-02-26 ENCOUNTER — Other Ambulatory Visit: Payer: Self-pay | Admitting: Family Medicine

## 2020-02-29 ENCOUNTER — Encounter: Payer: Self-pay | Admitting: Family Medicine

## 2020-02-29 ENCOUNTER — Other Ambulatory Visit: Payer: Self-pay | Admitting: Family Medicine

## 2020-02-29 MED ORDER — MAGIC MOUTHWASH: 5.0000 mL | Freq: Four times a day (QID) | ORAL | 1 refills | Status: DC | PRN

## 2020-02-29 NOTE — Telephone Encounter (Signed)
Requesting: xanax Contract:yes UDS:n/a Last OV:02/12/20 Next OV:04/01/20 Last Refill:12/12/2017  #[id number] Database:   Please advise

## 2020-03-01 ENCOUNTER — Other Ambulatory Visit: Payer: Self-pay | Admitting: Family Medicine

## 2020-03-01 MED ORDER — MAGIC MOUTHWASH: 5.0000 mL | Freq: Four times a day (QID) | ORAL | 1 refills | Status: DC | PRN

## 2020-03-01 MED ORDER — NYSTATIN 100000 UNIT/ML MT SUSP: 5.0000 mL | Freq: Four times a day (QID) | OROMUCOSAL | 0 refills | Status: DC

## 2020-03-01 MED FILL — NYSTATIN 100,000 UNITS/ML S: 100000 | 24 days supply | Qty: 473 | Fill #0

## 2020-03-01 MED FILL — ALPRAZolam 0.25 MG TABS: 0.25 | 20 days supply | Qty: 40 | Fill #0

## 2020-03-01 MED FILL — MAGIC MOUTHWASH: 12 days supply | Qty: 240 | Fill #0

## 2020-03-01 NOTE — Telephone Encounter (Signed)
Magic mouthwash rx faxed over to [facility]

## 2020-03-07 ENCOUNTER — Other Ambulatory Visit: Payer: Self-pay | Admitting: Family Medicine

## 2020-03-07 MED ORDER — VALACYCLOVIR HCL 1 G PO TABS: 2000.0000 mg | ORAL_TABLET | Freq: Two times a day (BID) | ORAL | 1 refills | Status: DC

## 2020-03-07 MED FILL — FAMOTIDINE 40 MG TABS: 40 | 90 days supply | Qty: 90 | Fill #0

## 2020-03-07 MED FILL — valACYclovir HCL 1 GM TABS: 1 | 1 days supply | Qty: 4 | Fill #0

## 2020-03-08 ENCOUNTER — Other Ambulatory Visit: Payer: Self-pay | Admitting: Family Medicine

## 2020-03-08 ENCOUNTER — Ambulatory Visit (INDEPENDENT_AMBULATORY_CARE_PROVIDER_SITE_OTHER): Payer: No Typology Code available for payment source | Admitting: Family Medicine

## 2020-03-08 ENCOUNTER — Other Ambulatory Visit: Payer: Self-pay

## 2020-03-08 VITALS — BP 124/88 | HR 83 | Temp 98.8°F | Wt 160.0 lb

## 2020-03-08 DIAGNOSIS — J029 Acute pharyngitis, unspecified: Secondary | ICD-10-CM

## 2020-03-08 MED ORDER — METHYLPREDNISOLONE 4 MG PO TABS: ORAL_TABLET | ORAL | 0 refills | Status: DC

## 2020-03-08 MED ORDER — CLINDAMYCIN HCL 300 MG PO CAPS: 300.0000 mg | ORAL_CAPSULE | Freq: Three times a day (TID) | ORAL | 0 refills | Status: DC

## 2020-03-08 MED ORDER — TRAMADOL HCL 50 MG PO TABS: 50.0000 mg | ORAL_TABLET | Freq: Two times a day (BID) | ORAL | 0 refills | Status: AC | PRN

## 2020-03-08 MED ORDER — LIDOCAINE VISCOUS HCL 2 % MT SOLN: 10.0000 mL | Freq: Four times a day (QID) | OROMUCOSAL | 0 refills | Status: DC | PRN

## 2020-03-08 MED FILL — METHYLPREDNISOLONE 4 MG TAB: 4 | 5 days supply | Qty: 15 | Fill #0

## 2020-03-08 MED FILL — LIDOCAINE 2% VISCOUS SOLN: 2 | 5 days supply | Qty: 200 | Fill #0

## 2020-03-08 MED FILL — traMADol HCL 50 MG TABS: 50 | 7 days supply | Qty: 15 | Fill #0

## 2020-03-08 MED FILL — CLINDAMYCIN HCL 300 MG CAPS: 300 | 7 days supply | Qty: 21 | Fill #0

## 2020-03-09 ENCOUNTER — Encounter (INDEPENDENT_AMBULATORY_CARE_PROVIDER_SITE_OTHER): Payer: No Typology Code available for payment source | Admitting: Ophthalmology

## 2020-03-09 DIAGNOSIS — J029 Acute pharyngitis, unspecified: Secondary | ICD-10-CM | POA: Insufficient documentation

## 2020-03-09 NOTE — Assessment & Plan Note (Addendum)
Continues to be in pain and swelling in throat getting worse. Has been treated with Valtrex for possible viral cause thus far. Will start on Clindamycin 300 mg tid and given viscous lidocaine 5-10 cc swish and spit qid prn and Tramadol 50 mg prn for pain. Seek care if swelling starts to compromise swallowing or breathing. Encouraged increased rest and hydration. Discussed treatment options and examined for 15 minutes.

## 2020-03-09 NOTE — Progress Notes (Signed)
.Virtual Visit via Video Note  I connected with Tricia Potts on 03/08/20 at  2:40 PM EDT by a video enabled telemedicine application and verified that I am speaking with the correct person using two identifiers.  Location: Patient: home Provider: office   I discussed the limitations of evaluation and management by telemedicine and the availability of in person appointments. The patient expressed understanding and agreed to proceed. Tricia Potts, CMA was able to get the patient setup on a video visit   Subjective:    Patient ID: Tricia Potts, female    DOB: 06-18-1971, 49 y.o.   MRN: [id number]  Chief Complaint  Patient presents with  . Sore Throat    FOR 2 WEEKS    HPI Patient is in today for evaluation of worsening pharyngitis. She has tried OTC meds and she took a course of Valtrex without improvement. She has had the sore throat for 2 weeks but her symptoms continue to worsen. She notes she is not having trouble swallowing but it is exceedingly painful and she is not eating well. No fevers or chills noted. She has continued to work. Denies CP/palp/SOB/HA/congestion/fevers/GI or GU c/o. Taking meds as prescribed  Past Medical History:  Diagnosis Date  . Allergy   . Anemia 06/18/2014  . Anxiety   . Anxiety and depression 02/17/2009   Qualifier: Diagnosis of  By: Tricia Potts, Tricia Potts    . Asthma   . Broken ankle 2011   (left) roller skating  . Bronchitis, mucopurulent recurrent ([place]) 04/21/2013  . Cervical cancer screening 02/20/2016  . Hemorrhoid   . History of hidradenitis suppurativa   . Hyperlipemia   . IBS (irritable bowel syndrome)   . Low back pain 05/25/2016  . PCO (polycystic ovaries)   . Pruritus 08/22/2017    Past Surgical History:  Procedure Laterality Date  . ANKLE SURGERY     plate and 8 screws in left ankle  . HEMORRHOID SURGERY      Family History  Problem Relation Age of Onset  . Allergies Mother   . COPD Mother   . Heart disease Father    mitral valve disease/rupture during physical stress  . GER disease Father   . Irritable bowel syndrome Father   . Heart failure Father   . Hepatitis C Brother   . Cancer Brother 17       ALL  . Heart disease Daughter        asd s/p repair at age 66  . Anxiety disorder Daughter   . Cancer Maternal Grandmother        ovarian cancer  . Heart disease Maternal Grandfather        MI at 1  . Kidney disease Paternal Grandfather        possible kidney cancer  . Allergies Sister   . Eczema Sister   . Cancer Sister 16       breast, DCIS  . Allergies Daughter   . Ovarian cancer Other        Grandmother  . Arthritis Other     Social History   Socioeconomic History  . Marital status: Married    Spouse name: Not on file  . Number of children: Not on file  . Years of education: Not on file  . Highest education level: Not on file  Occupational History  . Not on file  Tobacco Use  . Smoking status: Never Smoker  . Smokeless tobacco: Never Used  Substance and Sexual Activity  .  Alcohol use: No    Comment: vegetarian  . Drug use: No  . Sexual activity: Yes    Partners: Male    Birth control/protection: Other-see comments    Comment: vasectomy  Other Topics Concern  . Not on file  Social History Narrative  . Not on file   Social Determinants of Health   Financial Resource Strain:   . Difficulty of Paying Living Expenses:   Food Insecurity:   . Worried About Charity fundraiser in the Last Year:   . Arboriculturist in the Last Year:   Transportation Needs:   . Film/video editor (Medical):   Marland Kitchen Lack of Transportation (Non-Medical):   Physical Activity:   . Days of Exercise per Week:   . Minutes of Exercise per Session:   Stress:   . Feeling of Stress :   Social Connections:   . Frequency of Communication with Friends and Family:   . Frequency of Social Gatherings with Friends and Family:   . Attends Religious Services:   . Active Member of Clubs or Organizations:    . Attends Archivist Meetings:   Marland Kitchen Marital Status:   Intimate Partner Violence:   . Fear of Current or Ex-Partner:   . Emotionally Abused:   Marland Kitchen Physically Abused:   . Sexually Abused:     Outpatient Medications Prior to Visit  Medication Sig Dispense Refill  . albuterol (VENTOLIN HFA) 108 (90 Base) MCG/ACT inhaler Inhale 2 puffs into the lungs every 6 (six) hours as needed for wheezing or shortness of breath. 18 g 2  . ALPRAZolam (XANAX) 0.25 MG tablet TAKE 1 TABLET BY MOUTH 2 TIMES DAILY AS NEEDED FOR ANXIETY 40 tablet 1  . atorvastatin (LIPITOR) 10 MG tablet Take 1 tablet (10 mg total) by mouth daily. 90 tablet 3  . CALCIUM CITRATE PO Take 2 tablets by mouth daily.    . clindamycin (CLEOCIN) 300 MG capsule Take 1 capsule (300 mg total) by mouth 3 (three) times daily. 21 capsule 0  . clindamycin-benzoyl peroxide (BENZACLIN) gel Apply topically 2 (two) times daily. 25 g 1  . famotidine (PEPCID) 40 MG tablet Take 1 tablet (40 mg total) by mouth at bedtime. 90 tablet 1  . fexofenadine (ALLEGRA) 180 MG tablet Take 1 tablet (180 mg total) by mouth daily. 90 tablet 1  . fluticasone (FLONASE) 50 MCG/ACT nasal spray PLACE 2 SPRAYS INTO THE NOSE DAILY 48 g 1  . Fluticasone-Salmeterol (ADVAIR DISKUS) 250-50 MCG/DOSE AEPB Inhale 1 puff into the lungs 2 (two) times daily. 1 each 1  . hydrocortisone cream 1 % Apply 1 application topically as needed for itching.    Marland Kitchen ibuprofen (ADVIL,MOTRIN) 200 MG tablet Take 400-800 mg by mouth every 6 (six) hours as needed for headache or mild pain.    . montelukast (SINGULAIR) 10 MG tablet Take 1 tablet (10 mg total) by mouth at bedtime as needed. 90 tablet 3  . Multiple Vitamin (MULTI-VITAMIN DAILY PO) Take by mouth daily.    Marland Kitchen nystatin (MYCOSTATIN) 100000 UNIT/ML suspension Take 5 mLs (500,000 Units total) by mouth 4 (four) times daily. Swish and spit 473 mL 0  . OVER THE COUNTER MEDICATION Place 1 drop into both eyes as needed (allergy symptoms). OTC  eye drops  Systane/Artificial Tears/Homeopathic eye drops    . tiZANidine (ZANAFLEX) 4 MG tablet Take 0.5-1 tablets (2-4 mg total) by mouth 2 (two) times daily as needed for muscle spasms. 30 tablet 1  .  venlafaxine XR (EFFEXOR-XR) 75 MG 24 hr capsule Take 1 capsule (75 mg total) by mouth daily with breakfast. 90 capsule 1  . olopatadine (PATADAY) 0.1 % ophthalmic solution Place 1 drop into both eyes 2 (two) times daily. (Patient not taking: Reported on 03/08/2020) 5 mL 12  . benzonatate (TESSALON) 200 MG capsule Take 1 capsule (200 mg total) by mouth 3 (three) times daily as needed for cough. 30 capsule 0  . doxycycline (VIBRA-TABS) 100 MG tablet Take 1 tablet (100 mg total) by mouth 2 (two) times daily. 20 tablet 0  . magic mouthwash SOLN Take 5 mLs by mouth 4 (four) times daily as needed for mouth pain. Swish and spit 240 mL 1  . valACYclovir (VALTREX) 1000 MG tablet Take 2 tablets (2,000 mg total) by mouth 2 (two) times daily for 1 day. 4 tablet 1   No facility-administered medications prior to visit.    Not on File  Review of Systems  Constitutional: Positive for malaise/fatigue. Negative for chills and fever.  HENT: Positive for sore throat. Negative for congestion and ear pain.   Eyes: Negative for blurred vision.  Respiratory: Negative for shortness of breath.   Cardiovascular: Negative for chest pain, palpitations and leg swelling.  Gastrointestinal: Negative for abdominal pain, blood in stool and nausea.  Genitourinary: Negative for dysuria and frequency.  Musculoskeletal: Negative for falls.  Skin: Negative for rash.  Neurological: Negative for dizziness, loss of consciousness and headaches.  Endo/Heme/Allergies: Negative for environmental allergies.  Psychiatric/Behavioral: Negative for depression. The patient is not nervous/anxious.        Objective:    Physical Exam Constitutional:      Appearance: She is well-developed. She is not ill-appearing.  HENT:     Head:  Normocephalic and atraumatic.     Mouth/Throat:     Mouth: Mucous membranes are moist.     Pharynx: Uvula midline. Oropharyngeal exudate and posterior oropharyngeal erythema present.     Comments: Bilateral tonsils 2-3+ erythematous with exudate Pulmonary:     Effort: Pulmonary effort is normal.  Neurological:     Mental Status: She is alert and oriented to person, place, and time.  Psychiatric:        Mood and Affect: Mood normal.        Behavior: Behavior normal.     BP 124/88   Pulse 83   Temp 98.8 F (37.1 C)   Wt 160 lb (72.6 kg)   SpO2 99%   BMI 31.25 kg/m  Wt Readings from Last 3 Encounters:  03/08/20 160 lb (72.6 kg)  02/12/20 159 lb (72.1 kg)  01/13/20 160 lb 9.6 oz (72.8 kg)    Diabetic Foot Exam - Simple   No data filed     Lab Results  Component Value Date   WBC 9.5 01/19/2020   HGB 14.3 01/19/2020   HCT 42.7 01/19/2020   PLT 117.0 (L) 01/19/2020   GLUCOSE 83 01/19/2020   CHOL 225 (H) 01/19/2020   TRIG (H) 01/19/2020    472.0 Triglyceride is over 400; calculations on Lipids are invalid.   HDL 49.80 01/19/2020   LDLDIRECT 128.0 01/19/2020   LDLCALC 154 (H) 09/19/2018   ALT 36 (H) 01/19/2020   AST 21 01/19/2020   NA 139 01/19/2020   K 3.9 01/19/2020   CL 103 01/19/2020   CREATININE 0.68 01/19/2020   BUN 13 01/19/2020   CO2 29 01/19/2020   TSH 2.91 01/19/2020    Lab Results  Component Value Date  TSH 2.91 01/19/2020   Lab Results  Component Value Date   WBC 9.5 01/19/2020   HGB 14.3 01/19/2020   HCT 42.7 01/19/2020   MCV 90.6 01/19/2020   PLT 117.0 (L) 01/19/2020   Lab Results  Component Value Date   NA 139 01/19/2020   K 3.9 01/19/2020   CO2 29 01/19/2020   GLUCOSE 83 01/19/2020   BUN 13 01/19/2020   CREATININE 0.68 01/19/2020   BILITOT 0.3 01/19/2020   ALKPHOS 96 01/19/2020   AST 21 01/19/2020   ALT 36 (H) 01/19/2020   PROT 7.0 01/19/2020   ALBUMIN 3.8 01/19/2020   CALCIUM 9.1 01/19/2020   GFR 91.26 01/19/2020   Lab  Results  Component Value Date   CHOL 225 (H) 01/19/2020   Lab Results  Component Value Date   HDL 49.80 01/19/2020   Lab Results  Component Value Date   LDLCALC 154 (H) 09/19/2018   Lab Results  Component Value Date   TRIG (H) 01/19/2020    472.0 Triglyceride is over 400; calculations on Lipids are invalid.   Lab Results  Component Value Date   CHOLHDL 5 01/19/2020   No results found for: HGBA1C     Assessment & Plan:   Problem List Items Addressed This Visit    Pharyngitis - Primary    Continues to be in pain and swelling in throat getting worse. Has been treated with Valtrex for possible viral cause thus far. Will start on Clindamycin 300 mg tid and given viscous lidocaine 5-10 cc swish and spit qid prn and Tramadol 50 mg prn for pain. Seek care if swelling starts to compromise swallowing or breathing. Encouraged increased rest and hydration. Discussed treatment options and examined for 15 minutes.       Relevant Orders   Ambulatory referral to ENT      I have discontinued Aysha Landro. Wildes's benzonatate, doxycycline, magic mouthwash, and valACYclovir. I am also having her start on methylPREDNISolone, traMADol, and lidocaine. Additionally, I am having her maintain her OVER THE COUNTER MEDICATION, Multiple Vitamin (MULTI-VITAMIN DAILY PO), CALCIUM CITRATE PO, ibuprofen, hydrocortisone cream, montelukast, olopatadine, albuterol, clindamycin-benzoyl peroxide, tiZANidine, Fluticasone-Salmeterol, atorvastatin, fexofenadine, fluticasone, venlafaxine XR, famotidine, ALPRAZolam, nystatin, and clindamycin.  Meds ordered this encounter  Medications  . methylPREDNISolone (MEDROL) 4 MG tablet    Sig: 5 tab po qd X 1d then 4 tab po qd X 1d then 3 tab po qd X 1d then 2 tab po qd then 1 tab po qd    Dispense:  15 tablet    Refill:  0  . traMADol (ULTRAM) 50 MG tablet    Sig: Take 1 tablet (50 mg total) by mouth every 12 (twelve) hours as needed for up to 5 days for moderate pain.     Dispense:  15 tablet    Refill:  0  . lidocaine (XYLOCAINE) 2 % solution    Sig: Use as directed 10 mLs in the mouth or throat 4 (four) times daily as needed for mouth pain (swish and spit).    Dispense:  200 mL    Refill:  0     I discussed the assessment and treatment plan with the patient. The patient was provided an opportunity to ask questions and all were answered. The patient agreed with the plan and demonstrated an understanding of the instructions.   The patient was advised to call back or seek an in-person evaluation if the symptoms worsen or if the condition fails to improve as anticipated.  I provided 15 minutes of non-face-to-face time during this encounter.   Penni Homans, Potts

## 2020-03-16 ENCOUNTER — Encounter (INDEPENDENT_AMBULATORY_CARE_PROVIDER_SITE_OTHER): Payer: Self-pay | Admitting: Otolaryngology

## 2020-03-16 ENCOUNTER — Encounter: Payer: Self-pay | Admitting: Family Medicine

## 2020-03-16 ENCOUNTER — Other Ambulatory Visit: Payer: Self-pay

## 2020-03-16 ENCOUNTER — Ambulatory Visit (INDEPENDENT_AMBULATORY_CARE_PROVIDER_SITE_OTHER): Payer: No Typology Code available for payment source | Admitting: Otolaryngology

## 2020-03-16 VITALS — Temp 98.1°F

## 2020-03-16 DIAGNOSIS — K120 Recurrent oral aphthae: Secondary | ICD-10-CM | POA: Diagnosis not present

## 2020-03-16 DIAGNOSIS — J351 Hypertrophy of tonsils: Secondary | ICD-10-CM | POA: Diagnosis not present

## 2020-03-16 MED FILL — CHLORHEXIDINE 0.12% RINSE: 0.12 | 16 days supply | Qty: 473 | Fill #0

## 2020-03-16 NOTE — Progress Notes (Signed)
HPI: Tricia Potts is a 49 y.o. female who presents is referred by Dr. Charlett Blake for evaluation of chronic sore throat and oral sores.  This began initially following bilateral pneumonia in January where she was treated with multiple rounds of antibiotics.  She apparently subsequently developed thrush and was treated with nystatin.  She developed some sores on her palate and in the tonsil region and was treated with clindamycin and Medrol dose pack.  The pain is gradually gotten little bit better.  She was also treated with a short course of acyclovir as she developed some ulcers on the soft palate especially on the right side.  She is gradually doing better today.  She recently saw her dentist who felt that her tonsils are enlarged and recommended follow-up with ENT. She also takes medicine for GERD.Marland Kitchen  Past Medical History:  Diagnosis Date  . Allergy   . Anemia 06/18/2014  . Anxiety   . Anxiety and depression 02/17/2009   Qualifier: Diagnosis of  By: Redmond Pulling MD, Frann Rider    . Asthma   . Broken ankle 2011   (left) roller skating  . Bronchitis, mucopurulent recurrent ([place]) 04/21/2013  . Cervical cancer screening 02/20/2016  . Hemorrhoid   . History of hidradenitis suppurativa   . Hyperlipemia   . IBS (irritable bowel syndrome)   . Low back pain 05/25/2016  . PCO (polycystic ovaries)   . Pruritus 08/22/2017   Past Surgical History:  Procedure Laterality Date  . ANKLE SURGERY     plate and 8 screws in left ankle  . HEMORRHOID SURGERY     Social History   Socioeconomic History  . Marital status: Married    Spouse name: Not on file  . Number of children: Not on file  . Years of education: Not on file  . Highest education level: Not on file  Occupational History  . Not on file  Tobacco Use  . Smoking status: Never Smoker  . Smokeless tobacco: Never Used  Substance and Sexual Activity  . Alcohol use: No    Comment: vegetarian  . Drug use: No  . Sexual activity: Yes    Partners: Male   Birth control/protection: Other-see comments    Comment: vasectomy  Other Topics Concern  . Not on file  Social History Narrative  . Not on file   Social Determinants of Health   Financial Resource Strain:   . Difficulty of Paying Living Expenses:   Food Insecurity:   . Worried About Charity fundraiser in the Last Year:   . Arboriculturist in the Last Year:   Transportation Needs:   . Film/video editor (Medical):   Marland Kitchen Lack of Transportation (Non-Medical):   Physical Activity:   . Days of Exercise per Week:   . Minutes of Exercise per Session:   Stress:   . Feeling of Stress :   Social Connections:   . Frequency of Communication with Friends and Family:   . Frequency of Social Gatherings with Friends and Family:   . Attends Religious Services:   . Active Member of Clubs or Organizations:   . Attends Archivist Meetings:   Marland Kitchen Marital Status:    Family History  Problem Relation Age of Onset  . Allergies Mother   . COPD Mother   . Heart disease Father        mitral valve disease/rupture during physical stress  . GER disease Father   . Irritable bowel syndrome Father   .  Heart failure Father   . Hepatitis C Brother   . Cancer Brother 17       ALL  . Heart disease Daughter        asd s/p repair at age 54  . Anxiety disorder Daughter   . Cancer Maternal Grandmother        ovarian cancer  . Heart disease Maternal Grandfather        MI at 70  . Kidney disease Paternal Grandfather        possible kidney cancer  . Allergies Sister   . Eczema Sister   . Cancer Sister 76       breast, DCIS  . Allergies Daughter   . Ovarian cancer Other        Grandmother  . Arthritis Other    Not on File Prior to Admission medications   Medication Sig Start Date End Date Taking? Authorizing Provider  albuterol (VENTOLIN HFA) 108 (90 Base) MCG/ACT inhaler Inhale 2 puffs into the lungs every 6 (six) hours as needed for wheezing or shortness of breath. 09/16/19  Yes Mosie Lukes, MD  ALPRAZolam Duanne Moron) 0.25 MG tablet TAKE 1 TABLET BY MOUTH 2 TIMES DAILY AS NEEDED FOR ANXIETY 02/29/20  Yes Mosie Lukes, MD  atorvastatin (LIPITOR) 10 MG tablet Take 1 tablet (10 mg total) by mouth daily. 01/22/20  Yes Mosie Lukes, MD  CALCIUM CITRATE PO Take 2 tablets by mouth daily.   Yes [provider]  clindamycin (CLEOCIN) 300 MG capsule Take 1 capsule (300 mg total) by mouth 3 (three) times daily. 03/08/20  Yes Mosie Lukes, MD  clindamycin-benzoyl peroxide (BENZACLIN) gel Apply topically 2 (two) times daily. 10/30/19  Yes Mosie Lukes, MD  famotidine (PEPCID) 40 MG tablet Take 1 tablet (40 mg total) by mouth at bedtime. 02/12/20  Yes Mosie Lukes, MD  fexofenadine (ALLEGRA) 180 MG tablet Take 1 tablet (180 mg total) by mouth daily. 02/12/20  Yes Mosie Lukes, MD  fluticasone (FLONASE) 50 MCG/ACT nasal spray PLACE 2 SPRAYS INTO THE NOSE DAILY 02/12/20  Yes Mosie Lukes, MD  Fluticasone-Salmeterol (ADVAIR DISKUS) 250-50 MCG/DOSE AEPB Inhale 1 puff into the lungs 2 (two) times daily. 01/18/20  Yes Mosie Lukes, MD  hydrocortisone cream 1 % Apply 1 application topically as needed for itching.   Yes [provider]  ibuprofen (ADVIL,MOTRIN) 200 MG tablet Take 400-800 mg by mouth every 6 (six) hours as needed for headache or mild pain.   Yes [provider]  lidocaine (XYLOCAINE) 2 % solution Use as directed 10 mLs in the mouth or throat 4 (four) times daily as needed for mouth pain (swish and spit). 03/08/20  Yes Mosie Lukes, MD  methylPREDNISolone (MEDROL) 4 MG tablet 5 tab po qd X 1d then 4 tab po qd X 1d then 3 tab po qd X 1d then 2 tab po qd then 1 tab po qd 03/08/20  Yes Mosie Lukes, MD  montelukast (SINGULAIR) 10 MG tablet Take 1 tablet (10 mg total) by mouth at bedtime as needed. 12/29/18  Yes Mosie Lukes, MD  Multiple Vitamin (MULTI-VITAMIN DAILY PO) Take by mouth daily.   Yes [provider]  nystatin (MYCOSTATIN)  100000 UNIT/ML suspension Take 5 mLs (500,000 Units total) by mouth 4 (four) times daily. Swish and spit 03/01/20  Yes Mosie Lukes, MD  olopatadine (PATADAY) 0.1 % ophthalmic solution Place 1 drop into both eyes 2 (two) times  daily. 04/06/19  Yes Mosie Lukes, MD  OVER THE COUNTER MEDICATION Place 1 drop into both eyes as needed (allergy symptoms). OTC eye drops  Systane/Artificial Tears/Homeopathic eye drops   Yes [provider]  tiZANidine (ZANAFLEX) 4 MG tablet Take 0.5-1 tablets (2-4 mg total) by mouth 2 (two) times daily as needed for muscle spasms. 10/30/19  Yes Mosie Lukes, MD  venlafaxine XR (EFFEXOR-XR) 75 MG 24 hr capsule Take 1 capsule (75 mg total) by mouth daily with breakfast. 02/12/20  Yes Mosie Lukes, MD     Positive ROS: Otherwise negative  All other systems have been reviewed and were otherwise negative with the exception of those mentioned in the HPI and as above.  Physical Exam: Constitutional: Alert, well-appearing, no acute distress Ears: External ears without lesions or tenderness. Ear canals are clear bilaterally with intact, clear TMs.  Nasal: External nose without lesions.. Clear nasal passages with clear middle meatus bilaterally no signs of infection. Oral: Lips and gums without lesions.  Patient has a superficial ulcer of the right soft palate anterior tonsillar pillar measuring approximately 12 to 14 mm in oval shape.  Clinically this appears to represent a large aphthous type ulcer.  She has generous 2-3+ size tonsils bilaterally with no acute exudate on the tonsillar surface.  They were soft to palpation but are enlarged bilaterally.  No evidence of peritonsillar abscess. Neck: No palpable adenopathy or masses Respiratory: Breathing comfortably  Skin: No facial/neck lesions or rash noted.  Procedures  Assessment: Presently no clinical evidence of thrush or acute tonsillitis. Palate sore is most likely viral in etiology and is consistent  with an enlarged aphthous type ulcer. Moderate tonsillar hypertrophy.  Plan: I discussed with patient today that there is really no clinical evidence of active bacterial infection requiring further antibiotic therapy.  Presently no evidence of thrush. Would recommend use of throat lozenges for sore throat and would expect the ulcer to gradually resolve and heal over the next 5 to 10 days. Concerning the enlarged tonsils perhaps they will decrease in size as she recovers from the chronic mouth sores.  She does relate that she snores on a regular basis.  I would wait a month to see if the tonsils naturally decrease in size and if she has persistent snoring and any clinical evidence of sleep apnea could consider obtaining a sleep test. Briefly discussed tonsillectomy with her and the morbidity associated with this.   Radene Journey, MD   CC:

## 2020-03-17 ENCOUNTER — Encounter: Payer: Self-pay | Admitting: Gastroenterology

## 2020-03-17 ENCOUNTER — Other Ambulatory Visit: Payer: Self-pay | Admitting: Family Medicine

## 2020-03-17 DIAGNOSIS — R1013 Epigastric pain: Secondary | ICD-10-CM

## 2020-03-17 DIAGNOSIS — J029 Acute pharyngitis, unspecified: Secondary | ICD-10-CM

## 2020-03-17 DIAGNOSIS — K580 Irritable bowel syndrome with diarrhea: Secondary | ICD-10-CM

## 2020-03-22 ENCOUNTER — Ambulatory Visit (INDEPENDENT_AMBULATORY_CARE_PROVIDER_SITE_OTHER): Payer: No Typology Code available for payment source | Admitting: Otolaryngology

## 2020-03-23 ENCOUNTER — Other Ambulatory Visit: Payer: Self-pay | Admitting: Family Medicine

## 2020-03-23 DIAGNOSIS — J029 Acute pharyngitis, unspecified: Secondary | ICD-10-CM

## 2020-03-23 DIAGNOSIS — K219 Gastro-esophageal reflux disease without esophagitis: Secondary | ICD-10-CM

## 2020-03-23 DIAGNOSIS — K121 Other forms of stomatitis: Secondary | ICD-10-CM

## 2020-03-23 MED ORDER — FLUCONAZOLE 150 MG PO TABS: 150.0000 mg | ORAL_TABLET | ORAL | 1 refills | Status: DC

## 2020-03-23 MED FILL — FLUCONAZOLE 150 MG TABS: 150 | 14 days supply | Qty: 2 | Fill #0

## 2020-03-28 ENCOUNTER — Other Ambulatory Visit (INDEPENDENT_AMBULATORY_CARE_PROVIDER_SITE_OTHER): Payer: No Typology Code available for payment source

## 2020-03-28 DIAGNOSIS — K121 Other forms of stomatitis: Secondary | ICD-10-CM | POA: Diagnosis not present

## 2020-03-28 DIAGNOSIS — K219 Gastro-esophageal reflux disease without esophagitis: Secondary | ICD-10-CM | POA: Diagnosis not present

## 2020-03-28 DIAGNOSIS — J029 Acute pharyngitis, unspecified: Secondary | ICD-10-CM | POA: Diagnosis not present

## 2020-03-28 LAB — COMPREHENSIVE METABOLIC PANEL
ALT: 26 U/L (ref 0–35)
AST: 24 U/L (ref 0–37)
Albumin: 4.3 g/dL (ref 3.5–5.2)
Alkaline Phosphatase: 67 U/L (ref 39–117)
BUN: 8 mg/dL (ref 6–23)
CO2: 26 mEq/L (ref 19–32)
Calcium: 9.4 mg/dL (ref 8.4–10.5)
Chloride: 103 mEq/L (ref 96–112)
Creatinine, Ser: 0.65 mg/dL (ref 0.40–1.20)
GFR: 96.06 mL/min (ref >60.00)
Glucose, Bld: 90 mg/dL (ref 70–99)
Potassium: 3.7 mEq/L (ref 3.5–5.1)
Sodium: 136 mEq/L (ref 135–145)
Total Bilirubin: 0.4 mg/dL (ref 0.2–1.2)
Total Protein: 7.3 g/dL (ref 6.0–8.3)

## 2020-03-28 LAB — CBC WITH DIFFERENTIAL/PLATELET
Basophils Absolute: 0.1 10*3/uL (ref 0.0–0.1)
Basophils Relative: 0.8 % (ref 0.0–3.0)
Eosinophils Absolute: 0.5 10*3/uL (ref 0.0–0.7)
Eosinophils Relative: 8 % — ABNORMAL HIGH (ref 0.0–5.0)
HCT: 39.6 % (ref 36.0–46.0)
Hemoglobin: 13.6 g/dL (ref 12.0–15.0)
Lymphocytes Relative: 36.1 % (ref 12.0–46.0)
Lymphs Abs: 2.4 10*3/uL (ref 0.7–4.0)
MCHC: 34.4 g/dL (ref 30.0–36.0)
MCV: 91 fl (ref 78.0–100.0)
Monocytes Absolute: 0.5 10*3/uL (ref 0.1–1.0)
Monocytes Relative: 7.8 % (ref 3.0–12.0)
Neutro Abs: 3.1 10*3/uL (ref 1.4–7.7)
Neutrophils Relative %: 47.3 % (ref 43.0–77.0)
Platelets: 280 10*3/uL (ref 150.0–400.0)
RBC: 4.35 Mil/uL (ref 3.87–5.11)
RDW: 13.3 % (ref 11.5–15.5)
WBC: 6.5 10*3/uL (ref 4.0–10.5)

## 2020-03-28 LAB — FOLATE: Folate: >23.7 ng/mL (ref >5.9)

## 2020-03-28 LAB — VITAMIN B12: Vitamin B-12: 428 pg/mL (ref 211–911)

## 2020-03-28 LAB — MAGNESIUM: Magnesium: 1.7 mg/dL (ref 1.5–2.5)

## 2020-03-29 LAB — VITAMIN D 25 HYDROXY (VIT D DEFICIENCY, FRACTURES): VITD: 29.86 ng/mL — ABNORMAL LOW (ref 30.00–100.00)

## 2020-04-01 ENCOUNTER — Encounter: Payer: Self-pay | Admitting: Family Medicine

## 2020-04-01 ENCOUNTER — Ambulatory Visit (INDEPENDENT_AMBULATORY_CARE_PROVIDER_SITE_OTHER): Payer: No Typology Code available for payment source | Admitting: Family Medicine

## 2020-04-01 ENCOUNTER — Other Ambulatory Visit: Payer: Self-pay

## 2020-04-01 DIAGNOSIS — F419 Anxiety disorder, unspecified: Secondary | ICD-10-CM

## 2020-04-01 DIAGNOSIS — F329 Major depressive disorder, single episode, unspecified: Secondary | ICD-10-CM

## 2020-04-01 DIAGNOSIS — R739 Hyperglycemia, unspecified: Secondary | ICD-10-CM

## 2020-04-01 DIAGNOSIS — K219 Gastro-esophageal reflux disease without esophagitis: Secondary | ICD-10-CM | POA: Diagnosis not present

## 2020-04-01 DIAGNOSIS — E559 Vitamin D deficiency, unspecified: Secondary | ICD-10-CM | POA: Insufficient documentation

## 2020-04-01 DIAGNOSIS — J309 Allergic rhinitis, unspecified: Secondary | ICD-10-CM

## 2020-04-01 DIAGNOSIS — J209 Acute bronchitis, unspecified: Secondary | ICD-10-CM

## 2020-04-01 DIAGNOSIS — J029 Acute pharyngitis, unspecified: Secondary | ICD-10-CM

## 2020-04-01 DIAGNOSIS — J45909 Unspecified asthma, uncomplicated: Secondary | ICD-10-CM

## 2020-04-01 DIAGNOSIS — R7989 Other specified abnormal findings of blood chemistry: Secondary | ICD-10-CM | POA: Diagnosis not present

## 2020-04-01 DIAGNOSIS — F32A Depression, unspecified: Secondary | ICD-10-CM

## 2020-04-01 NOTE — Assessment & Plan Note (Signed)
She continues to take Venlafaxine XR 75 mg but she is counseled that if she feels she needs to increase to 150 mg she can and then she is supposed to notify us so we can send in new prescription and set her up for a follow up on 4-8 weeks to assess response. She has an appt with counseling later this month

## 2020-04-01 NOTE — Assessment & Plan Note (Signed)
Has stopped her antihistamines and her alergies are stable on Flonase

## 2020-04-01 NOTE — Assessment & Plan Note (Signed)
Improving no new exacerbations recently. She has had several negative covid COVID tests but she questions if she had it.

## 2020-04-01 NOTE — Progress Notes (Signed)
Virtual Visit via Video Note  I connected with Tricia Potts on 04/01/20 at  8:20 AM EDT by a video enabled telemedicine application and verified that I am speaking with the correct person using two identifiers.  Location: Patient: home Provider: home   I discussed the limitations of evaluation and management by telemedicine and the availability of in person appointments. The patient expressed understanding and agreed to proceed. Tricia Potts, CMA was able to set the patient up with a visit, video   Subjective:    Patient ID: Tricia Potts, female    DOB: 1971-02-22, 49 y.o.   MRN: [id number]  Chief Complaint  Patient presents with  . Follow-up    HPI Patient is in today for follow up on chronic medical concerns. No recent febrile illness or hospitalizations. She is feeling much better. Her throat and mouth are much better but she does have a sense of globus at times. Denies CP/palp/SOB/HA/congestion/fevers/GI or GU c/o. Taking meds as prescribed. She is starting with counseling soon. She feels well compared to last month.   Past Medical History:  Diagnosis Date  . Allergy   . Anemia 06/18/2014  . Anxiety   . Anxiety and depression 02/17/2009   Qualifier: Diagnosis of  By: Redmond Pulling MD, Frann Rider    . Asthma   . Broken ankle 2011   (left) roller skating  . Bronchitis, mucopurulent recurrent ([place]) 04/21/2013  . Cervical cancer screening 02/20/2016  . Hemorrhoid   . History of hidradenitis suppurativa   . Hyperlipemia   . IBS (irritable bowel syndrome)   . Low back pain 05/25/2016  . PCO (polycystic ovaries)   . Pruritus 08/22/2017    Past Surgical History:  Procedure Laterality Date  . ANKLE SURGERY     plate and 8 screws in left ankle  . HEMORRHOID SURGERY      Family History  Problem Relation Age of Onset  . Allergies Mother   . COPD Mother   . Heart disease Father        mitral valve disease/rupture during physical stress  . GER disease Father   . Irritable bowel  syndrome Father   . Heart failure Father   . Hepatitis C Brother   . Cancer Brother 17       ALL  . Heart disease Daughter        asd s/p repair at age 75  . Anxiety disorder Daughter   . Cancer Maternal Grandmother        ovarian cancer  . Heart disease Maternal Grandfather        MI at 82  . Kidney disease Paternal Grandfather        possible kidney cancer  . Allergies Sister   . Eczema Sister   . Cancer Sister 51       breast, DCIS  . Allergies Daughter   . Ovarian cancer Other        Grandmother  . Arthritis Other     Social History   Socioeconomic History  . Marital status: Married    Spouse name: Not on file  . Number of children: Not on file  . Years of education: Not on file  . Highest education level: Not on file  Occupational History  . Not on file  Tobacco Use  . Smoking status: Never Smoker  . Smokeless tobacco: Never Used  Substance and Sexual Activity  . Alcohol use: No    Comment: vegetarian  . Drug use: No  .  Sexual activity: Yes    Partners: Male    Birth control/protection: Other-see comments    Comment: vasectomy  Other Topics Concern  . Not on file  Social History Narrative  . Not on file   Social Determinants of Health   Financial Resource Strain:   . Difficulty of Paying Living Expenses:   Food Insecurity:   . Worried About Charity fundraiser in the Last Year:   . Arboriculturist in the Last Year:   Transportation Needs:   . Film/video editor (Medical):   Marland Kitchen Lack of Transportation (Non-Medical):   Physical Activity:   . Days of Exercise per Week:   . Minutes of Exercise per Session:   Stress:   . Feeling of Stress :   Social Connections:   . Frequency of Communication with Friends and Family:   . Frequency of Social Gatherings with Friends and Family:   . Attends Religious Services:   . Active Member of Clubs or Organizations:   . Attends Archivist Meetings:   Marland Kitchen Marital Status:   Intimate Partner Violence:     . Fear of Current or Ex-Partner:   . Emotionally Abused:   Marland Kitchen Physically Abused:   . Sexually Abused:     Outpatient Medications Prior to Visit  Medication Sig Dispense Refill  . albuterol (VENTOLIN HFA) 108 (90 Base) MCG/ACT inhaler Inhale 2 puffs into the lungs every 6 (six) hours as needed for wheezing or shortness of breath. 18 g 2  . ALPRAZolam (XANAX) 0.25 MG tablet TAKE 1 TABLET BY MOUTH 2 TIMES DAILY AS NEEDED FOR ANXIETY 40 tablet 1  . atorvastatin (LIPITOR) 10 MG tablet Take 1 tablet (10 mg total) by mouth daily. 90 tablet 3  . CALCIUM CITRATE PO Take 2 tablets by mouth daily.    . clindamycin (CLEOCIN) 300 MG capsule Take 1 capsule (300 mg total) by mouth 3 (three) times daily. 21 capsule 0  . clindamycin-benzoyl peroxide (BENZACLIN) gel Apply topically 2 (two) times daily. 25 g 1  . famotidine (PEPCID) 40 MG tablet Take 1 tablet (40 mg total) by mouth at bedtime. 90 tablet 1  . fexofenadine (ALLEGRA) 180 MG tablet Take 1 tablet (180 mg total) by mouth daily. 90 tablet 1  . fluconazole (DIFLUCAN) 150 MG tablet Take 1 tablet (150 mg total) by mouth once a week. 2 tablet 1  . fluticasone (FLONASE) 50 MCG/ACT nasal spray PLACE 2 SPRAYS INTO THE NOSE DAILY 48 g 1  . Fluticasone-Salmeterol (ADVAIR DISKUS) 250-50 MCG/DOSE AEPB Inhale 1 puff into the lungs 2 (two) times daily. 1 each 1  . hydrocortisone cream 1 % Apply 1 application topically as needed for itching.    Marland Kitchen ibuprofen (ADVIL,MOTRIN) 200 MG tablet Take 400-800 mg by mouth every 6 (six) hours as needed for headache or mild pain.    Marland Kitchen lidocaine (XYLOCAINE) 2 % solution Use as directed 10 mLs in the mouth or throat 4 (four) times daily as needed for mouth pain (swish and spit). 200 mL 0  . methylPREDNISolone (MEDROL) 4 MG tablet 5 tab po qd X 1d then 4 tab po qd X 1d then 3 tab po qd X 1d then 2 tab po qd then 1 tab po qd 15 tablet 0  . montelukast (SINGULAIR) 10 MG tablet Take 1 tablet (10 mg total) by mouth at bedtime as needed.  90 tablet 3  . Multiple Vitamin (MULTI-VITAMIN DAILY PO) Take by mouth daily.    Marland Kitchen  nystatin (MYCOSTATIN) 100000 UNIT/ML suspension Take 5 mLs (500,000 Units total) by mouth 4 (four) times daily. Swish and spit 473 mL 0  . olopatadine (PATADAY) 0.1 % ophthalmic solution Place 1 drop into both eyes 2 (two) times daily. 5 mL 12  . OVER THE COUNTER MEDICATION Place 1 drop into both eyes as needed (allergy symptoms). OTC eye drops  Systane/Artificial Tears/Homeopathic eye drops    . tiZANidine (ZANAFLEX) 4 MG tablet Take 0.5-1 tablets (2-4 mg total) by mouth 2 (two) times daily as needed for muscle spasms. 30 tablet 1  . venlafaxine XR (EFFEXOR-XR) 75 MG 24 hr capsule Take 1 capsule (75 mg total) by mouth daily with breakfast. 90 capsule 1   No facility-administered medications prior to visit.    Not on File  Review of Systems  Constitutional: Negative for fever and malaise/fatigue.  HENT: Negative for congestion.   Eyes: Negative for blurred vision.  Respiratory: Negative for shortness of breath.   Cardiovascular: Negative for chest pain, palpitations and leg swelling.  Gastrointestinal: Negative for abdominal pain, blood in stool and nausea.  Genitourinary: Negative for dysuria and frequency.  Musculoskeletal: Negative for falls.  Skin: Negative for rash.  Neurological: Negative for dizziness, loss of consciousness and headaches.  Endo/Heme/Allergies: Negative for environmental allergies.  Psychiatric/Behavioral: Negative for depression. The patient is not nervous/anxious.        Objective:    Physical Exam Constitutional:      Appearance: Normal appearance.  HENT:     Head: Normocephalic and atraumatic.  Eyes:     General:        Right eye: No discharge.        Left eye: No discharge.  Pulmonary:     Effort: Pulmonary effort is normal.  Neurological:     Mental Status: She is alert and oriented to person, place, and time.  Psychiatric:        Behavior: Behavior normal.      Wt 158 lb (71.7 kg)   BMI 30.86 kg/m  Wt Readings from Last 3 Encounters:  04/01/20 158 lb (71.7 kg)  03/08/20 160 lb (72.6 kg)  02/12/20 159 lb (72.1 kg)    Diabetic Foot Exam - Simple   No data filed     Lab Results  Component Value Date   WBC 6.5 03/28/2020   HGB 13.6 03/28/2020   HCT 39.6 03/28/2020   PLT 280.0 03/28/2020   GLUCOSE 90 03/28/2020   CHOL 225 (H) 01/19/2020   TRIG (H) 01/19/2020    472.0 Triglyceride is over 400; calculations on Lipids are invalid.   HDL 49.80 01/19/2020   LDLDIRECT 128.0 01/19/2020   LDLCALC 154 (H) 09/19/2018   ALT 26 03/28/2020   AST 24 03/28/2020   NA 136 03/28/2020   K 3.7 03/28/2020   CL 103 03/28/2020   CREATININE 0.65 03/28/2020   BUN 8 03/28/2020   CO2 26 03/28/2020   TSH 2.91 01/19/2020    Lab Results  Component Value Date   TSH 2.91 01/19/2020   Lab Results  Component Value Date   WBC 6.5 03/28/2020   HGB 13.6 03/28/2020   HCT 39.6 03/28/2020   MCV 91.0 03/28/2020   PLT 280.0 03/28/2020   Lab Results  Component Value Date   NA 136 03/28/2020   K 3.7 03/28/2020   CO2 26 03/28/2020   GLUCOSE 90 03/28/2020   BUN 8 03/28/2020   CREATININE 0.65 03/28/2020   BILITOT 0.4 03/28/2020   ALKPHOS 67 03/28/2020  AST 24 03/28/2020   ALT 26 03/28/2020   PROT 7.3 03/28/2020   ALBUMIN 4.3 03/28/2020   CALCIUM 9.4 03/28/2020   GFR 96.06 03/28/2020   Lab Results  Component Value Date   CHOL 225 (H) 01/19/2020   Lab Results  Component Value Date   HDL 49.80 01/19/2020   Lab Results  Component Value Date   LDLCALC 154 (H) 09/19/2018   Lab Results  Component Value Date   TRIG (H) 01/19/2020    472.0 Triglyceride is over 400; calculations on Lipids are invalid.   Lab Results  Component Value Date   CHOLHDL 5 01/19/2020   No results found for: HGBA1C     Assessment & Plan:   Problem List Items Addressed This Visit    Anxiety and depression    She continues to take Venlafaxine XR 75 mg but she  is counseled that if she feels she needs to increase to 150 mg she can and then she is supposed to notify us so we can send in new prescription and set her up for a follow up on 4-8 weeks to assess response. She has an appt with counseling later this month      Allergic rhinitis    Has stopped her antihistamines and her alergies are stable on Flonase      Bronchitis with asthma, subacute    Improving no new exacerbations recently. She has had several negative covid COVID tests but she questions if she had it.       Acid reflux    Taking Famotidine qhs and her symptoms are greatly improved but still present, has an appt with GI in May. No changes      Elevated blood sugar    hgba1c acceptable, minimize simple carbs. Increase exercise as tolerated.       Pharyngitis    Improved recently no changes.       Low vitamin D level    Has started Vitamin D 1000 IU daily and we will recheck levels with next visit         I am having Tricia Potts maintain her OVER THE COUNTER MEDICATION, Multiple Vitamin (MULTI-VITAMIN DAILY PO), CALCIUM CITRATE PO, ibuprofen, hydrocortisone cream, montelukast, olopatadine, albuterol, clindamycin-benzoyl peroxide, tiZANidine, Fluticasone-Salmeterol, atorvastatin, fexofenadine, fluticasone, venlafaxine XR, famotidine, ALPRAZolam, nystatin, clindamycin, methylPREDNISolone, lidocaine, and fluconazole.  No orders of the defined types were placed in this encounter.    Penni Homans, MD    I discussed the assessment and treatment plan with the patient. The patient was provided an opportunity to ask questions and all were answered. The patient agreed with the plan and demonstrated an understanding of the instructions.   The patient was advised to call back or seek an in-person evaluation if the symptoms worsen or if the condition fails to improve as anticipated.  I provided 25 minutes of non-face-to-face time during this encounter.   Penni Homans, MD

## 2020-04-01 NOTE — Assessment & Plan Note (Signed)
Taking Famotidine qhs and her symptoms are greatly improved but still present, has an appt with GI in May. No changes

## 2020-04-01 NOTE — Assessment & Plan Note (Signed)
Improved recently no changes.

## 2020-04-01 NOTE — Assessment & Plan Note (Signed)
>>  ASSESSMENT AND PLAN FOR OTHER ALLERGIC RHINITIS WRITTEN ON 04/01/2020  8:47 AM BY BLYTH, STACEY A, MD  Has stopped her antihistamines and her alergies are stable on Flonase

## 2020-04-01 NOTE — Assessment & Plan Note (Signed)
Has started Vitamin D 1000 IU daily and we will recheck levels with next visit

## 2020-04-01 NOTE — Assessment & Plan Note (Signed)
hgba1c acceptable, minimize simple carbs. Increase exercise as tolerated.  

## 2020-04-02 ENCOUNTER — Encounter: Payer: Self-pay | Admitting: Family Medicine

## 2020-04-02 LAB — VITAMIN B1: Vitamin B1 (Thiamine): 18 nmol/L (ref 8–30)

## 2020-04-02 LAB — ZINC: Zinc: 77 ug/dL (ref 60–130)

## 2020-04-04 ENCOUNTER — Other Ambulatory Visit: Payer: Self-pay | Admitting: Family Medicine

## 2020-04-04 MED ORDER — ATORVASTATIN CALCIUM 10 MG PO TABS: 10.0000 mg | ORAL_TABLET | Freq: Every day | ORAL | 3 refills | Status: DC

## 2020-04-04 MED FILL — ATORVASTATIN 10 MG TABLET: 10 | 90 days supply | Qty: 90 | Fill #0

## 2020-04-04 NOTE — Telephone Encounter (Signed)
Medication has been sent in 

## 2020-04-06 ENCOUNTER — Encounter (INDEPENDENT_AMBULATORY_CARE_PROVIDER_SITE_OTHER): Payer: No Typology Code available for payment source | Admitting: Ophthalmology

## 2020-04-18 NOTE — Progress Notes (Signed)
[facility] Note  04/20/2020     CHIEF COMPLAINT Patient presents for Retina Follow Up   HISTORY OF PRESENT ILLNESS: Tricia Potts is a 49 y.o. female who presents to the clinic today for:   HPI    Retina Follow Up    Patient presents with  Other.  In left eye.  This started months ago.  Severity is moderate.  Duration of months.  Since onset it is stable.  I, the attending physician,  performed the HPI with the patient and updated documentation appropriately.          Comments    Pt states her vision is the same OU.  Denies eye pain or discomfort and denies any new or worsening floaters or fol OU.       Last edited by Bernarda Caffey, MD on 04/20/2020 11:07 AM. (History)    pt    Referring physician:   HISTORICAL INFORMATION:   Selected notes from the MEDICAL RECORD NUMBER Referred by [facility] for concern of blurry vision / possible heme LEE:  Ocular Hx- PMH-    CURRENT MEDICATIONS: Current Outpatient Medications (Ophthalmic Drugs)  Medication Sig  . olopatadine (PATADAY) 0.1 % ophthalmic solution Place 1 drop into both eyes 2 (two) times daily.   No current facility-administered medications for this visit. (Ophthalmic Drugs)   Current Outpatient Medications (Other)  Medication Sig  . albuterol (VENTOLIN HFA) 108 (90 Base) MCG/ACT inhaler Inhale 2 puffs into the lungs every 6 (six) hours as needed for wheezing or shortness of breath.  . ALPRAZolam (XANAX) 0.25 MG tablet TAKE 1 TABLET BY MOUTH 2 TIMES DAILY AS NEEDED FOR ANXIETY  . atorvastatin (LIPITOR) 10 MG tablet Take 1 tablet (10 mg total) by mouth daily.  Marland Kitchen CALCIUM CITRATE PO Take 2 tablets by mouth daily.  . clindamycin (CLEOCIN) 300 MG capsule Take 1 capsule (300 mg total) by mouth 3 (three) times daily.  . clindamycin-benzoyl peroxide (BENZACLIN) gel Apply topically 2 (two) times daily.  . famotidine (PEPCID) 40 MG tablet Take 1 tablet (40 mg total) by mouth at bedtime.   . fexofenadine (ALLEGRA) 180 MG tablet Take 1 tablet (180 mg total) by mouth daily.  . fluconazole (DIFLUCAN) 150 MG tablet Take 1 tablet (150 mg total) by mouth once a week.  . fluticasone (FLONASE) 50 MCG/ACT nasal spray PLACE 2 SPRAYS INTO THE NOSE DAILY  . Fluticasone-Salmeterol (ADVAIR DISKUS) 250-50 MCG/DOSE AEPB Inhale 1 puff into the lungs 2 (two) times daily.  . hydrocortisone cream 1 % Apply 1 application topically as needed for itching.  Marland Kitchen ibuprofen (ADVIL,MOTRIN) 200 MG tablet Take 400-800 mg by mouth every 6 (six) hours as needed for headache or mild pain.  Marland Kitchen lidocaine (XYLOCAINE) 2 % solution Use as directed 10 mLs in the mouth or throat 4 (four) times daily as needed for mouth pain (swish and spit).  . methylPREDNISolone (MEDROL) 4 MG tablet 5 tab po qd X 1d then 4 tab po qd X 1d then 3 tab po qd X 1d then 2 tab po qd then 1 tab po qd  . montelukast (SINGULAIR) 10 MG tablet Take 1 tablet (10 mg total) by mouth at bedtime as needed.  . Multiple Vitamin (MULTI-VITAMIN DAILY PO) Take by mouth daily.  Marland Kitchen nystatin (MYCOSTATIN) 100000 UNIT/ML suspension Take 5 mLs (500,000 Units total) by mouth 4 (four) times daily. Swish and spit  . OVER THE COUNTER MEDICATION Place 1 drop into  both eyes as needed (allergy symptoms). OTC eye drops  Systane/Artificial Tears/Homeopathic eye drops  . tiZANidine (ZANAFLEX) 4 MG tablet Take 0.5-1 tablets (2-4 mg total) by mouth 2 (two) times daily as needed for muscle spasms.  Marland Kitchen venlafaxine XR (EFFEXOR-XR) 75 MG 24 hr capsule Take 1 capsule (75 mg total) by mouth daily with breakfast.   No current facility-administered medications for this visit. (Other)      REVIEW OF SYSTEMS: ROS    Positive for: Respiratory, Psychiatric   Negative for: Constitutional, Gastrointestinal, Neurological, Skin, Genitourinary, Musculoskeletal, HENT, Endocrine, Cardiovascular, Eyes, Allergic/Imm, Heme/Lymph   Last edited by Doneen Poisson on 04/20/2020  9:52 AM. (History)        ALLERGIES Not on File  PAST MEDICAL HISTORY Past Medical History:  Diagnosis Date  . Allergy   . Anemia 06/18/2014  . Anxiety   . Anxiety and depression 02/17/2009   Qualifier: Diagnosis of  By: Redmond Pulling MD, Frann Rider    . Asthma   . Broken ankle 2011   (left) roller skating  . Bronchitis, mucopurulent recurrent ([place]) 04/21/2013  . Cervical cancer screening 02/20/2016  . Hemorrhoid   . History of hidradenitis suppurativa   . Hyperlipemia   . IBS (irritable bowel syndrome)   . Low back pain 05/25/2016  . PCO (polycystic ovaries)   . Pruritus 08/22/2017   Past Surgical History:  Procedure Laterality Date  . ANKLE SURGERY     plate and 8 screws in left ankle  . HEMORRHOID SURGERY      FAMILY HISTORY Family History  Problem Relation Age of Onset  . Allergies Mother   . COPD Mother   . Heart disease Father        mitral valve disease/rupture during physical stress  . GER disease Father   . Irritable bowel syndrome Father   . Heart failure Father   . Hepatitis C Brother   . Cancer Brother 17       ALL  . Heart disease Daughter        asd s/p repair at age 77  . Anxiety disorder Daughter   . Cancer Maternal Grandmother        ovarian cancer  . Heart disease Maternal Grandfather        MI at 66  . Kidney disease Paternal Grandfather        possible kidney cancer  . Allergies Sister   . Eczema Sister   . Cancer Sister 77       breast, DCIS  . Allergies Daughter   . Ovarian cancer Other        Grandmother  . Arthritis Other     SOCIAL HISTORY Social History   Tobacco Use  . Smoking status: Never Smoker  . Smokeless tobacco: Never Used  Substance Use Topics  . Alcohol use: No    Comment: vegetarian  . Drug use: No         OPHTHALMIC EXAM:  Base Eye Exam    Visual Acuity (Snellen - Linear)      Right Left   Dist cc 20/20 20/20 -1   Correction: Glasses       Tonometry (Tonopen, 10:20AM)      Right Left   Pressure 15 16       Pupils       Dark Light Shape React APD   Right 3 2 Round Brisk 0   Left 3 2 Round Brisk 0       Visual Fields  Left Right    Full Full       Extraocular Movement      Right Left    Full Full       Neuro/Psych    Oriented x3: Yes   Mood/Affect: Normal       Dilation    Both eyes: 1.0% Mydriacyl, 2.5% Phenylephrine @ 10:21 AM        Slit Lamp and Fundus Exam    Slit Lamp Exam      Right Left   Lids/Lashes Normal Normal   Conjunctiva/Sclera White and quiet White and quiet   Cornea 1+ diffuse Punctate epithelial erosions, lasik scar barely visible Trace Punctate epithelial erosions, lasik scar barely visible   Anterior Chamber deep and clear deep and clear   Iris Round and dilated Round and dilated   Lens 1+ Cortical cataract, 1+ Nuclear sclerosis 1+ Cortical cataract, 1+ Nuclear sclerosis   Vitreous Vitreous syneresis Vitreous syneresis, Posterior vitreous detachment       Fundus Exam      Right Left   Disc Pink and sharp, compact Pink and sharp, mild tilt, compact   C/D Ratio 0.0 0.0   Macula Flat, Blunted foveal reflex, No heme or edema, trace Epiretinal membrane  Flat, blunted foveal reflex, 2-3+ ERM   Vessels Mild vascular attenuation, mild tortuousity Mild Vascular attenuation, Tortuous   Periphery Attached, pigmented cystoid dengeration peripherally, No RT/RD Attached, pigmented cystoid degeneration, No RT/RD        Refraction    Wearing Rx      Sphere Cylinder Axis Add   Right -0.25 Sphere  +2.00   Left -2.25 +1.50 068 +2.00          IMAGING AND PROCEDURES  Imaging and Procedures for @TODAY @  OCT, Retina - OU - Both Eyes       Right Eye Quality was good. Central Foveal Thickness: 270. Progression has been stable. Findings include normal foveal contour, no SRF, no IRF, myopic contour.   Left Eye Quality was good. Central Foveal Thickness: 327. Progression has been stable. Findings include abnormal foveal contour, epiretinal membrane, no SRF, no IRF,  myopic contour (Mild interval progression of erm and pucker nasal to fovea.).   Notes *Images captured and stored on drive  Diagnosis / Impression:  OD: NFP, no IRF, SRF OS: +ERM; no IRF/SRF -- Mild interval progression of erm and pucker nasal to fovea.   Clinical management:  See below  Abbreviations: NFP - Normal foveal profile. CME - cystoid macular edema. PED - pigment epithelial detachment. IRF - intraretinal fluid. SRF - subretinal fluid. EZ - ellipsoid zone. ERM - epiretinal membrane. ORA - outer retinal atrophy. ORT - outer retinal tubulation. SRHM - subretinal hyper-reflective material                 ASSESSMENT/PLAN:    ICD-10-CM   1. Epiretinal membrane (ERM) of left eye  H35.372   2. Retinal edema  H35.81 OCT, Retina - OU - Both Eyes  3. Combined forms of age-related cataract of both eyes  H25.813   4. Hx of LASIK  Z98.890   5. Dry eyes  H04.123     1,2. Epiretinal membrane, OS  - Mild interval progression of erm and pucker nasal to fovea.  - no metamorphopsia  - BCVA remains 20/20 OS  - no indication for surgery at this time  - monitor for now  - f/u 6-10 mos for repeat DFE/OCT, sooner prn  3. Cataracts  -  Mild Cortical/Nuclear cataract OU  - The symptoms of cataract, surgical options, and treatments and risks were discussed with patient.  - discussed diagnosis and progression  - not yet visually significant  - monitor for now  4. History of Lasik   - performed by Dr. Gershon Crane in 2007  - stable  5. Dry eyes OU  - recommend artificial tears and lubricating ointment as needed    Ophthalmic Meds Ordered this visit:  No orders of the defined types were placed in this encounter.      Return in about 10 months (around 02/20/2021) for Return 01/2021 for f/u for ERM OS (DFE and OCT).  There are no Patient Instructions on file for this visit.   Explained the diagnoses, plan, and follow up with the patient and they expressed understanding.  Patient  expressed understanding of the importance of proper follow up care.   Gardiner Sleeper, M.D., Ph.D. Diseases & Surgery of the Retina and [place] 04/20/2020   I have reviewed the above documentation for accuracy and completeness, and I agree with the above. Gardiner Sleeper, M.D., Ph.D. 04/22/20 12:40 AM   Abbreviations: M myopia (nearsighted); A astigmatism; H hyperopia (farsighted); P presbyopia; Mrx spectacle prescription;  CTL contact lenses; OD right eye; OS left eye; OU both eyes  XT exotropia; ET esotropia; PEK punctate epithelial keratitis; PEE punctate epithelial erosions; DES dry eye syndrome; MGD meibomian gland dysfunction; ATs artificial tears; PFAT's preservative free artificial tears; [place] nuclear sclerotic cataract; PSC posterior subcapsular cataract; ERM epi-retinal membrane; PVD posterior vitreous detachment; RD retinal detachment; DM diabetes mellitus; DR diabetic retinopathy; NPDR non-proliferative diabetic retinopathy; PDR proliferative diabetic retinopathy; CSME clinically significant macular edema; DME diabetic macular edema; dbh dot blot hemorrhages; CWS cotton wool spot; POAG primary open angle glaucoma; C/D cup-to-disc ratio; HVF humphrey visual field; GVF goldmann visual field; OCT optical coherence tomography; IOP intraocular pressure; BRVO Branch retinal vein occlusion; CRVO central retinal vein occlusion; CRAO central retinal artery occlusion; BRAO branch retinal artery occlusion; RT retinal tear; SB scleral buckle; PPV pars plana vitrectomy; VH Vitreous hemorrhage; PRP panretinal laser photocoagulation; IVK intravitreal kenalog; VMT vitreomacular traction; MH Macular hole;  NVD neovascularization of the disc; NVE neovascularization elsewhere; AREDS age related eye disease study; ARMD age related macular degeneration; POAG primary open angle glaucoma; EBMD epithelial/anterior basement membrane dystrophy; ACIOL anterior chamber intraocular lens;  IOL intraocular lens; PCIOL posterior chamber intraocular lens; Phaco/IOL phacoemulsification with intraocular lens placement; [place] photorefractive keratectomy; LASIK laser assisted in situ keratomileusis; HTN hypertension; DM diabetes mellitus; COPD chronic obstructive pulmonary disease

## 2020-04-20 ENCOUNTER — Encounter (INDEPENDENT_AMBULATORY_CARE_PROVIDER_SITE_OTHER): Payer: Self-pay | Admitting: Ophthalmology

## 2020-04-20 ENCOUNTER — Ambulatory Visit (INDEPENDENT_AMBULATORY_CARE_PROVIDER_SITE_OTHER): Payer: No Typology Code available for payment source | Admitting: Ophthalmology

## 2020-04-20 ENCOUNTER — Encounter (INDEPENDENT_AMBULATORY_CARE_PROVIDER_SITE_OTHER): Payer: No Typology Code available for payment source | Admitting: Ophthalmology

## 2020-04-20 ENCOUNTER — Telehealth: Payer: Self-pay | Admitting: Family Medicine

## 2020-04-20 DIAGNOSIS — H04123 Dry eye syndrome of bilateral lacrimal glands: Secondary | ICD-10-CM

## 2020-04-20 DIAGNOSIS — Z9889 Other specified postprocedural states: Secondary | ICD-10-CM | POA: Diagnosis not present

## 2020-04-20 DIAGNOSIS — H35372 Puckering of macula, left eye: Secondary | ICD-10-CM | POA: Diagnosis not present

## 2020-04-20 DIAGNOSIS — H35379 Puckering of macula, unspecified eye: Secondary | ICD-10-CM

## 2020-04-20 DIAGNOSIS — H25813 Combined forms of age-related cataract, bilateral: Secondary | ICD-10-CM | POA: Diagnosis not present

## 2020-04-20 DIAGNOSIS — H3581 Retinal edema: Secondary | ICD-10-CM | POA: Diagnosis not present

## 2020-04-20 NOTE — Telephone Encounter (Signed)
Caller : Kaylani Lane  Call Back # [phone]  Subject : Referral   Pt called and states that she is seeing [facility], patient states that she needs a referral from Dr Charlett Blake for insurance. Patient is at there office now.   Patient is seeing doctor for a 1 year follow up  For epiretinal membrane condition.

## 2020-04-20 NOTE — Telephone Encounter (Signed)
Patient has [employer] ins.  Referral placed.

## 2020-04-26 ENCOUNTER — Ambulatory Visit: Payer: No Typology Code available for payment source | Admitting: Gastroenterology

## 2020-04-26 ENCOUNTER — Encounter: Payer: Self-pay | Admitting: Gastroenterology

## 2020-04-26 VITALS — BP 118/70 | HR 82 | Temp 98.5°F | Ht 60.75 in | Wt 161.0 lb

## 2020-04-26 DIAGNOSIS — Z1211 Encounter for screening for malignant neoplasm of colon: Secondary | ICD-10-CM | POA: Diagnosis not present

## 2020-04-26 DIAGNOSIS — R12 Heartburn: Secondary | ICD-10-CM | POA: Diagnosis not present

## 2020-04-26 DIAGNOSIS — K219 Gastro-esophageal reflux disease without esophagitis: Secondary | ICD-10-CM | POA: Diagnosis not present

## 2020-04-26 DIAGNOSIS — R0989 Other specified symptoms and signs involving the circulatory and respiratory systems: Secondary | ICD-10-CM

## 2020-04-26 MED ORDER — OMEPRAZOLE 20 MG PO CPDR
20.0000 mg | DELAYED_RELEASE_CAPSULE | Freq: Every day | ORAL | 3 refills | Status: DC
Start: 2020-04-26 — End: 2021-06-01

## 2020-04-26 MED FILL — OMEPRAZOLE 20 MG CAP: 20 | 30 days supply | Qty: 30 | Fill #0

## 2020-04-26 NOTE — Patient Instructions (Signed)
If you are age 51 or older, your body mass index should be between 23-30. Your Body mass index is 30.67 kg/m. If this is out of the aforementioned range listed, please consider follow up with your Primary Care Provider.  If you are age 7 or younger, your body mass index should be between 19-25. Your Body mass index is 30.67 kg/m. If this is out of the aformentioned range listed, please consider follow up with your Primary Care Provider.   We have given you a sample of Sutab for your colonoscopy preparation.  We have sent the following medications to your pharmacy for you to pick up at your convenience:  Omeprazole 20 mg daily 30 minutes before breakfast..  Continue with Pepcid at bedtime as needed.  Due to recent changes in healthcare laws, you may see the results of your imaging and laboratory studies on MyChart before your provider has had a chance to review them.  We understand that in some cases there may be results that are confusing or concerning to you. Not all laboratory results come back in the same time frame and the provider may be waiting for multiple results in order to interpret others.  Please give [place] 48 hours in order for your provider to thoroughly review all the results before contacting the office for clarification of your results.   Thank you for choosing me and [facility]  Dr Silverio Decamp

## 2020-04-26 NOTE — Progress Notes (Signed)
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  She has had sporadic GERD symptoms on and off for many years now but GERD symptoms significantly worse since March, she has to clear up throat multiple times during the day, especially worse after eating, also is having intermittent cough and globus sensation  She is taking Pepcid daily with some improvement but continues to have breakthrough heartburn, cough and globus sensation.  She had canker sores on her soft palate, had to have numbing cream applied by ENT and also had scope.  Has since resolved.  She has had intermittent canker sores in her mouth but never on her palate.    She developed fever, cough, bilateral pneumonia, bronchitis and pharyngitis in January 2021.  Tested negative for Covid  Brother age 82 has possible Crohn's disease, is currently undergoing work-up.  He is also leukemia survivor and had hep C through blood transfusion.  No family history of colon cancer.  She took probiotics for 30 days, has had significant improvement of bowel habits since then.  She is currently regular.  Denies any nausea, vomiting, abdominal pain, melena or rectal bleeding. She had rare small-volume bright red blood per rectum, thinks from hemorrhoids.  Never had colonoscopy.  Occasional NSAID use, once every 1 to 2 months.  Outpatient Encounter Medications as of 04/26/2020  Medication Sig  . albuterol (VENTOLIN HFA) 108 (90 Base) MCG/ACT inhaler Inhale 2 puffs into the lungs every 6 (six) hours as needed for wheezing or shortness of breath.  . ALPRAZolam (XANAX) 0.25 MG tablet  TAKE 1 TABLET BY MOUTH 2 TIMES DAILY AS NEEDED FOR ANXIETY  . atorvastatin (LIPITOR) 10 MG tablet Take 1 tablet (10 mg total) by mouth daily.  Marland Kitchen CALCIUM CITRATE PO Take 2 tablets by mouth daily.  . clindamycin-benzoyl peroxide (BENZACLIN) gel Apply topically 2 (two) times daily.  . famotidine (PEPCID) 40 MG tablet Take 1 tablet (40 mg total) by mouth at bedtime.  . fexofenadine (ALLEGRA) 180 MG tablet Take 1 tablet (180 mg total) by mouth daily.  . fluticasone (FLONASE) 50 MCG/ACT nasal spray PLACE 2 SPRAYS INTO THE NOSE DAILY  . Fluticasone-Salmeterol (ADVAIR DISKUS) 250-50 MCG/DOSE AEPB Inhale 1 puff into the lungs 2 (two) times daily.  . hydrocortisone cream 1 % Apply 1 application topically as needed for itching.  Marland Kitchen ibuprofen (ADVIL,MOTRIN) 200 MG tablet Take 400-800 mg by mouth every 6 (six) hours as needed for headache or mild pain.  . montelukast (SINGULAIR) 10 MG tablet Take 1 tablet (10 mg total) by mouth at bedtime as needed.  . Multiple Vitamin (MULTI-VITAMIN DAILY PO) Take by mouth daily.  Marland Kitchen olopatadine (PATADAY) 0.1 % ophthalmic solution Place 1 drop into both eyes 2 (two) times daily.  Marland Kitchen OVER THE COUNTER MEDICATION Place 1 drop into both eyes as needed (allergy symptoms). OTC eye drops  Systane/Artificial Tears/Homeopathic eye drops  . tiZANidine (ZANAFLEX) 4 MG tablet Take 0.5-1 tablets (2-4 mg total) by mouth 2 (two) times daily as needed for muscle spasms.  Marland Kitchen venlafaxine XR (EFFEXOR-XR) 75 MG 24 hr capsule Take 1 capsule (75  mg total) by mouth daily with breakfast.  . omeprazole (PRILOSEC) 20 MG capsule Take 1 capsule (20 mg total) by mouth daily before breakfast.  . [DISCONTINUED] clindamycin (CLEOCIN) 300 MG capsule Take 1 capsule (300 mg total) by mouth 3 (three) times daily.  . [DISCONTINUED] fluconazole (DIFLUCAN) 150 MG tablet Take 1 tablet (150 mg total) by mouth once a week.  . [DISCONTINUED] lidocaine (XYLOCAINE) 2 % solution Use as directed 10 mLs in the mouth or throat  4 (four) times daily as needed for mouth pain (swish and spit).  . [DISCONTINUED] methylPREDNISolone (MEDROL) 4 MG tablet 5 tab po qd X 1d then 4 tab po qd X 1d then 3 tab po qd X 1d then 2 tab po qd then 1 tab po qd  . [DISCONTINUED] nystatin (MYCOSTATIN) 100000 UNIT/ML suspension Take 5 mLs (500,000 Units total) by mouth 4 (four) times daily. Swish and spit   No facility-administered encounter medications on file as of 04/26/2020.    Allergies as of 04/26/2020  . (Not on File)    Past Medical History:  Diagnosis Date  . Allergy   . Anemia 06/18/2014  . Anxiety   . Anxiety and depression 02/17/2009   Qualifier: Diagnosis of  By: Redmond Pulling MD, Frann Rider    . Asthma   . Broken ankle 2011   (left) roller skating  . Bronchitis, mucopurulent recurrent ([place]) 04/21/2013  . Cervical cancer screening 02/20/2016  . Hemorrhoid   . History of hidradenitis suppurativa   . Hyperlipemia   . IBS (irritable bowel syndrome)   . Low back pain 05/25/2016  . PCO (polycystic ovaries)   . Pruritus 08/22/2017    Past Surgical History:  Procedure Laterality Date  . ANKLE SURGERY     plate and 8 screws in left ankle  . HEMORRHOID SURGERY      Family History  Problem Relation Age of Onset  . Allergies Mother   . COPD Mother   . Heart disease Father        mitral valve disease/rupture during physical stress  . GER disease Father   . Irritable bowel syndrome Father   . Heart failure Father   . Hepatitis C Brother   . Cancer Brother 17       ALL  . Heart disease Daughter        asd s/p repair at age 5  . Anxiety disorder Daughter   . Cancer Maternal Grandmother        ovarian cancer  . Heart disease Maternal Grandfather        MI at 64  . Kidney disease Paternal Grandfather        possible kidney cancer  . Allergies Sister   . Eczema Sister   . Cancer Sister 40       breast, DCIS  . Allergies Daughter   . Ovarian cancer Other        Grandmother  . Arthritis Other     Social History    Socioeconomic History  . Marital status: Married    Spouse name: Not on file  . Number of children: Not on file  . Years of education: Not on file  . Highest education level: Not on file  Occupational History  . Not on file  Tobacco Use  . Smoking status: Never Smoker  . Smokeless tobacco: Never Used  Substance and Sexual Activity  . Alcohol use: No    Comment: vegetarian  . Drug use: No  . Sexual activity:  Yes    Partners: Male    Birth control/protection: Other-see comments    Comment: vasectomy  Other Topics Concern  . Not on file  Social History Narrative  . Not on file   Social Determinants of Health   Financial Resource Strain:   . Difficulty of Paying Living Expenses:   Food Insecurity:   . Worried About Charity fundraiser in the Last Year:   . Arboriculturist in the Last Year:   Transportation Needs:   . Film/video editor (Medical):   Marland Kitchen Lack of Transportation (Non-Medical):   Physical Activity:   . Days of Exercise per Week:   . Minutes of Exercise per Session:   Stress:   . Feeling of Stress :   Social Connections:   . Frequency of Communication with Friends and Family:   . Frequency of Social Gatherings with Friends and Family:   . Attends Religious Services:   . Active Member of Clubs or Organizations:   . Attends Archivist Meetings:   Marland Kitchen Marital Status:   Intimate Partner Violence:   . Fear of Current or Ex-Partner:   . Emotionally Abused:   Marland Kitchen Physically Abused:   . Sexually Abused:       Review of systems: All other review of systems negative except as mentioned in the HPI.   Physical Exam: Vitals:   04/26/20 1004  BP: 118/70  Pulse: 82  Temp: 98.5 F (36.9 C)   Body mass index is 30.67 kg/m. 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  She is due for colorectal cancer screening, schedule for colonoscopy along with EGD SuTAB bowel prep  The risks and benefits as well as alternatives of endoscopic procedure(s) have been discussed and reviewed. All questions answered. The patient agrees to proceed.   The patient was provided an opportunity to ask questions and all were answered. The patient agreed with the plan and demonstrated an understanding of the instructions.  Damaris Hippo , MD    CC: Mosie Lukes, MD

## 2020-04-29 MED FILL — VENLAFAXINE HCL ER 75 MG CA: 75 | 90 days supply | Qty: 90 | Fill #1

## 2020-05-05 ENCOUNTER — Encounter: Payer: Self-pay | Admitting: Family Medicine

## 2020-05-15 ENCOUNTER — Encounter: Payer: Self-pay | Admitting: Family Medicine

## 2020-05-16 ENCOUNTER — Other Ambulatory Visit: Payer: Self-pay | Admitting: Family Medicine

## 2020-05-16 MED ORDER — TRIAMCINOLONE ACETONIDE 0.1 % EX CREA
1.0000 | TOPICAL_CREAM | Freq: Two times a day (BID) | CUTANEOUS | 1 refills | Status: DC
Start: 2020-05-16 — End: 2022-04-13

## 2020-05-16 MED FILL — TRIAMCINOLONE ACETONIDE 0.1: 0.1 | 30 days supply | Qty: 80 | Fill #0

## 2020-05-30 ENCOUNTER — Other Ambulatory Visit: Payer: Self-pay

## 2020-05-30 ENCOUNTER — Other Ambulatory Visit (HOSPITAL_COMMUNITY)
Admission: RE | Admit: 2020-05-30 | Discharge: 2020-05-30 | Disposition: A | Discharge status: Home / Self Care | Payer: No Typology Code available for payment source | Source: Ambulatory Visit | Attending: Family Medicine | Admitting: Family Medicine

## 2020-05-30 ENCOUNTER — Encounter: Payer: Self-pay | Admitting: Family

## 2020-05-30 ENCOUNTER — Ambulatory Visit (INDEPENDENT_AMBULATORY_CARE_PROVIDER_SITE_OTHER): Payer: No Typology Code available for payment source | Admitting: Family

## 2020-05-30 VITALS — BP 112/64 | HR 74 | Resp 18 | Ht 60.75 in | Wt 161.0 lb

## 2020-05-30 DIAGNOSIS — Z0000 Encounter for general adult medical examination without abnormal findings: Secondary | ICD-10-CM

## 2020-05-30 NOTE — Patient Instructions (Addendum)
Please complete lab work prior to leaving.    Preventive Care 24-51 Years Old, Female Preventive care refers to visits with your health care provider and lifestyle choices that can promote health and wellness. This includes:  A yearly physical exam. This may also be called an annual well check.  Regular dental visits and eye exams.  Immunizations.  Screening for certain conditions.  Healthy lifestyle choices, such as eating a healthy diet, getting regular exercise, not using drugs or products that contain nicotine and tobacco, and limiting alcohol use. What can I expect for my preventive care visit? Physical exam Your health care provider will check your:  Height and weight. This may be used to calculate body mass index (BMI), which tells if you are at a healthy weight.  Heart rate and blood pressure.  Skin for abnormal spots. Counseling Your health care provider may ask you questions about your:  Alcohol, tobacco, and drug use.  Emotional well-being.  Home and relationship well-being.  Sexual activity.  Eating habits.  Work and work Statistician.  Method of birth control.  Menstrual cycle.  Pregnancy history. What immunizations do I need?  Influenza (flu) vaccine  This is recommended every year. Tetanus, diphtheria, and pertussis (Tdap) vaccine  You may need a Td booster every 10 years. Varicella (chickenpox) vaccine  You may need this if you have not been vaccinated. Zoster (shingles) vaccine  You may need this after age 32. Measles, mumps, and rubella (MMR) vaccine  You may need at least one dose of MMR if you were born in 1957 or later. You may also need a second dose. Pneumococcal conjugate (PCV13) vaccine  You may need this if you have certain conditions and were not previously vaccinated. Pneumococcal polysaccharide (PPSV23) vaccine  You may need one or two doses if you smoke cigarettes or if you have certain conditions. Meningococcal conjugate  (MenACWY) vaccine  You may need this if you have certain conditions. Hepatitis A vaccine  You may need this if you have certain conditions or if you travel or work in places where you may be exposed to hepatitis A. Hepatitis B vaccine  You may need this if you have certain conditions or if you travel or work in places where you may be exposed to hepatitis B. Haemophilus influenzae type b (Hib) vaccine  You may need this if you have certain conditions. Human papillomavirus (HPV) vaccine  If recommended by your health care provider, you may need three doses over 6 months. You may receive vaccines as individual doses or as more than one vaccine together in one shot (combination vaccines). Talk with your health care provider about the risks and benefits of combination vaccines. What tests do I need? Blood tests  Lipid and cholesterol levels. These may be checked every 5 years, or more frequently if you are over 72 years old.  Hepatitis C test.  Hepatitis B test. Screening  Lung cancer screening. You may have this screening every year starting at age 24 if you have a 30-pack-year history of smoking and currently smoke or have quit within the past 15 years.  Colorectal cancer screening. All adults should have this screening starting at age 44 and continuing until age 17. Your health care provider may recommend screening at age 66 if you are at increased risk. You will have tests every 1-10 years, depending on your results and the type of screening test.  Diabetes screening. This is done by checking your blood sugar (glucose) after you have  not eaten for a while (fasting). You may have this done every 1-3 years.  Mammogram. This may be done every 1-2 years. Talk with your health care provider about when you should start having regular mammograms. This may depend on whether you have a family history of breast cancer.  BRCA-related cancer screening. This may be done if you have a family  history of breast, ovarian, tubal, or peritoneal cancers.  Pelvic exam and Pap test. This may be done every 3 years starting at age 62. Starting at age 39, this may be done every 5 years if you have a Pap test in combination with an HPV test. Other tests  Sexually transmitted disease (STD) testing.  Bone density scan. This is done to screen for osteoporosis. You may have this scan if you are at high risk for osteoporosis. Follow these instructions at home: Eating and drinking  Eat a diet that includes fresh fruits and vegetables, whole grains, lean protein, and low-fat dairy.  Take vitamin and mineral supplements as recommended by your health care provider.  Do not drink alcohol if: ? Your health care provider tells you not to drink. ? You are pregnant, may be pregnant, or are planning to become pregnant.  If you drink alcohol: ? Limit how much you have to 0-1 drink a day. ? Be aware of how much alcohol is in your drink. In the [place] one drink equals one 12 oz bottle of beer (355 mL), one 5 oz glass of wine (148 mL), or one 1 oz glass of hard liquor (44 mL). Lifestyle  Take daily care of your teeth and gums.  Stay active. Exercise for at least 30 minutes on 5 or more days each week.  Do not use any products that contain nicotine or tobacco, such as cigarettes, e-cigarettes, and chewing tobacco. If you need help quitting, ask your health care provider.  If you are sexually active, practice safe sex. Use a condom or other form of birth control (contraception) in order to prevent pregnancy and STIs (sexually transmitted infections).  If told by your health care provider, take low-dose aspirin daily starting at age 13. What's next?  Visit your health care provider once a year for a well check visit.  Ask your health care provider how often you should have your eyes and teeth checked.  Stay up to date on all vaccines. This information is not intended to replace advice given to you  by your health care provider. Make sure you discuss any questions you have with your health care provider. Document Revised: 08/21/2018 Document Reviewed: 08/21/2018 Elsevier Patient Education  2020 [employer].

## 2020-05-30 NOTE — Progress Notes (Signed)
Subjective:    Patient ID: Tricia Potts, female    DOB: Sep 09, 1971, 49 y.o.   MRN: [id number]  HPI  Patient presents today for complete physical.  Immunizations: tdap 2015,  Complete pfizer series.  Diet:needs some improvement Wt Readings from Last 3 Encounters:  05/30/20 161 lb (73 kg)  04/26/20 161 lb (73 kg)  04/01/20 158 lb (71.7 kg)  Exercise: none Colonoscopy: due, scheduled Pap Smear: 02/20/16- due will complete Mammogram: due     Review of Systems  Constitutional: Negative for unexpected weight change.  HENT: Negative for hearing loss (hx of 20% loss in right ear- reports stable) and rhinorrhea.   Eyes: Negative for visual disturbance.  Respiratory: Positive for cough (mild chronic reflux cough). Negative for shortness of breath.   Cardiovascular: Negative for chest pain.  Gastrointestinal: Negative for blood in stool, constipation, diarrhea, nausea and vomiting.  Genitourinary: Negative for dysuria, frequency, hematuria and menstrual problem (reports irregular periods, occasional hot flashes).  Musculoskeletal: Negative for arthralgias and myalgias.  Skin: Negative for rash.  Neurological: Negative for headaches.  Hematological: Negative for adenopathy.  Psychiatric/Behavioral:       Denies depression/anxiety   Past Medical History:  Diagnosis Date  . Allergy   . Anemia 06/18/2014  . Anxiety   . Anxiety and depression 02/17/2009   Qualifier: Diagnosis of  By: Redmond Pulling MD, Frann Rider    . Asthma   . Broken ankle 2011   (left) roller skating  . Bronchitis, mucopurulent recurrent ([place]) 04/21/2013  . Cervical cancer screening 02/20/2016  . Depression   . Hemorrhoid   . History of hidradenitis suppurativa   . Hyperlipemia   . IBS (irritable bowel syndrome)   . Low back pain 05/25/2016  . PCO (polycystic ovaries)   . Pruritus 08/22/2017     Social History   Socioeconomic History  . Marital status: Married    Spouse name: Not on file  . Number of children: 3    . Years of education: Not on file  . Highest education level: Not on file  Occupational History  . Occupation: Programmer, multimedia: [employer]  Tobacco Use  . Smoking status: Never Smoker  . Smokeless tobacco: Never Used  Substance and Sexual Activity  . Alcohol use: No  . Drug use: No  . Sexual activity: Yes    Partners: Male    Birth control/protection: Other-see comments    Comment: vasectomy  Other Topics Concern  . Not on file  Social History Narrative  . Not on file   Social Determinants of Health   Financial Resource Strain:   . Difficulty of Paying Living Expenses:   Food Insecurity:   . Worried About Charity fundraiser in the Last Year:   . Arboriculturist in the Last Year:   Transportation Needs:   . Film/video editor (Medical):   Marland Kitchen Lack of Transportation (Non-Medical):   Physical Activity:   . Days of Exercise per Week:   . Minutes of Exercise per Session:   Stress:   . Feeling of Stress :   Social Connections:   . Frequency of Communication with Friends and Family:   . Frequency of Social Gatherings with Friends and Family:   . Attends Religious Services:   . Active Member of Clubs or Organizations:   . Attends Archivist Meetings:   Marland Kitchen Marital Status:   Intimate Partner Violence:   . Fear of Current or Ex-Partner:   .  Emotionally Abused:   Marland Kitchen Physically Abused:   . Sexually Abused:     Past Surgical History:  Procedure Laterality Date  . ANKLE SURGERY     plate and 8 screws in left ankle  . HEMORRHOID SURGERY      Family History  Problem Relation Age of Onset  . Allergies Mother   . COPD Mother   . Irritable bowel syndrome Mother   . Heart disease Father        mitral valve disease/rupture during physical stress  . GER disease Father   . Irritable bowel syndrome Father   . Heart failure Father   . Hepatitis C Brother   . Cancer Brother 17       ALL  . Heart disease Daughter        asd s/p repair at age 49  . Anxiety  disorder Daughter   . Cancer Maternal Grandmother        ovarian cancer  . Heart disease Maternal Grandfather        MI at 47  . Kidney disease Paternal Grandfather        possible kidney cancer  . Allergies Sister   . Eczema Sister   . Cancer Sister 87       breast, DCIS  . Allergies Daughter   . Kidney disease Paternal Grandmother   . Ovarian cancer Other        Grandmother  . Arthritis Other   . Irritable bowel syndrome Niece   . Colon cancer Neg Hx   . Rectal cancer Neg Hx   . Esophageal cancer Neg Hx     No Known Allergies  Current Outpatient Medications on File Prior to Visit  Medication Sig Dispense Refill  . albuterol (VENTOLIN HFA) 108 (90 Base) MCG/ACT inhaler Inhale 2 puffs into the lungs every 6 (six) hours as needed for wheezing or shortness of breath. 18 g 2  . ALPRAZolam (XANAX) 0.25 MG tablet TAKE 1 TABLET BY MOUTH 2 TIMES DAILY AS NEEDED FOR ANXIETY 40 tablet 1  . atorvastatin (LIPITOR) 10 MG tablet Take 1 tablet (10 mg total) by mouth daily. 90 tablet 3  . CALCIUM CITRATE PO Take 2 tablets by mouth daily.    . Cholecalciferol (VITAMIN D) 50 MCG (2000 UT) CAPS Take by mouth.    . clindamycin-benzoyl peroxide (BENZACLIN) gel Apply topically 2 (two) times daily. 25 g 1  . famotidine (PEPCID) 40 MG tablet Take 1 tablet (40 mg total) by mouth at bedtime. 90 tablet 1  . fexofenadine (ALLEGRA) 180 MG tablet Take 1 tablet (180 mg total) by mouth daily. 90 tablet 1  . fluticasone (FLONASE) 50 MCG/ACT nasal spray PLACE 2 SPRAYS INTO THE NOSE DAILY 48 g 1  . Fluticasone-Salmeterol (ADVAIR DISKUS) 250-50 MCG/DOSE AEPB Inhale 1 puff into the lungs 2 (two) times daily. 1 each 1  . hydrocortisone cream 1 % Apply 1 application topically as needed for itching.    Marland Kitchen ibuprofen (ADVIL,MOTRIN) 200 MG tablet Take 400-800 mg by mouth every 6 (six) hours as needed for headache or mild pain.    . montelukast (SINGULAIR) 10 MG tablet Take 1 tablet (10 mg total) by mouth at bedtime as  needed. 90 tablet 3  . Multiple Vitamin (MULTI-VITAMIN DAILY PO) Take by mouth daily.    Marland Kitchen olopatadine (PATADAY) 0.1 % ophthalmic solution Place 1 drop into both eyes 2 (two) times daily. 5 mL 12  . omeprazole (PRILOSEC) 20 MG capsule Take 1 capsule (  20 mg total) by mouth daily before breakfast. 30 capsule 3  . OVER THE COUNTER MEDICATION Place 1 drop into both eyes as needed (allergy symptoms). OTC eye drops  Systane/Artificial Tears/Homeopathic eye drops    . tiZANidine (ZANAFLEX) 4 MG tablet Take 0.5-1 tablets (2-4 mg total) by mouth 2 (two) times daily as needed for muscle spasms. 30 tablet 1  . triamcinolone cream (KENALOG) 0.1 % Apply 1 application topically 2 (two) times daily. 80 g 1  . venlafaxine XR (EFFEXOR-XR) 75 MG 24 hr capsule Take 1 capsule (75 mg total) by mouth daily with breakfast. 90 capsule 1   No current facility-administered medications on file prior to visit.    BP 112/64 (BP Location: Left Arm, Patient Position: Sitting, Cuff Size: Normal)   Pulse 74   Resp 18   Ht 5' 0.75" (1.543 m)   Wt 161 lb (73 kg)   SpO2 99%   BMI 30.67 kg/m       Objective:   Physical Exam  Physical Exam  Constitutional: She is oriented to person, place, and time. She appears well-developed and well-nourished. No distress.  HENT:  Head: Normocephalic and atraumatic.  Right Ear: Tympanic membrane and ear canal normal.  Left Ear: Tympanic membrane and ear canal normal.  Mouth/Throat: Not examined- pt wearing mask Eyes: Pupils are equal, round, and reactive to light. No scleral icterus.  Neck: Normal range of motion. No thyromegaly present.  Cardiovascular: Normal rate and regular rhythm.   No murmur heard. Pulmonary/Chest: Effort normal and breath sounds normal. No respiratory distress. He has no wheezes. She has no rales. She exhibits no tenderness.  Abdominal: Soft. Bowel sounds are normal. She exhibits no distension and no mass. There is no tenderness. There is no rebound and no  guarding.  Musculoskeletal: She exhibits no edema.  Lymphadenopathy:    She has no cervical adenopathy.  Neurological: She is alert and oriented to person, place, and time. She has normal patellar reflexes. She exhibits normal muscle tone. Coordination normal.  Skin: Skin is warm and dry.  Psychiatric: She has a normal mood and affect. Her behavior is normal. Judgment and thought content normal.  Breasts: Examined lying Right: Without masses, retractions, discharge or axillary adenopathy.  Left: Without masses, retractions, discharge or axillary adenopathy.  Inguinal/mons: Normal without inguinal adenopathy  External genitalia: Normal  BUS/Urethra/Skene's glands: Normal  Bladder: Normal  Vagina: Normal  Cervix: Normal  Uterus: normal in size, shape and contour. Midline and mobile  Adnexa/parametria:  Rt: Without masses or tenderness.  Lt: Without masses or tenderness.  Anus and perineum: Normal            Assessment & Plan:   Preventative care- discussed healthy diet and exercise. Will obtain routine lab work.  Refer for mammogram.  Pap performed today. Colo pended.  This visit occurred during the SARS-CoV-2 public health emergency.  Safety protocols were in place, including screening questions prior to the visit, additional usage of staff PPE, and extensive cleaning of exam room while observing appropriate contact time as indicated for disinfecting solutions.         Assessment & Plan:

## 2020-06-02 LAB — CYTOLOGY - PAP
Comment: NEGATIVE
Diagnosis: NEGATIVE
High risk HPV: NEGATIVE

## 2020-06-05 ENCOUNTER — Telehealth: Payer: Self-pay | Admitting: Family

## 2020-06-05 NOTE — Telephone Encounter (Signed)
Opened in error

## 2020-06-10 ENCOUNTER — Other Ambulatory Visit: Payer: Self-pay

## 2020-06-10 ENCOUNTER — Ambulatory Visit (INDEPENDENT_AMBULATORY_CARE_PROVIDER_SITE_OTHER): Payer: No Typology Code available for payment source

## 2020-06-10 DIAGNOSIS — Z23 Encounter for immunization: Secondary | ICD-10-CM | POA: Diagnosis not present

## 2020-06-10 NOTE — Progress Notes (Signed)
Patient here today for first shingrix vaccine. 0.76mL given in right upper outer quadrant per patient request. Patient tolerated well. VIS given. Immunization order sheet signed by patient.

## 2020-06-11 ENCOUNTER — Other Ambulatory Visit: Payer: Self-pay | Admitting: Family Medicine

## 2020-06-11 MED FILL — FLUTICASONE PROP 50 MCG SPR: 50 | 90 days supply | Qty: 48 | Fill #1

## 2020-06-13 MED FILL — OLOPATADINE HCL 0.1 % SOLN: 0.1 | 25 days supply | Qty: 5 | Fill #0

## 2020-06-17 ENCOUNTER — Ambulatory Visit (AMBULATORY_SURGERY_CENTER): Payer: No Typology Code available for payment source | Admitting: Gastroenterology

## 2020-06-17 ENCOUNTER — Other Ambulatory Visit: Payer: Self-pay | Admitting: Gastroenterology

## 2020-06-17 ENCOUNTER — Other Ambulatory Visit: Payer: Self-pay

## 2020-06-17 ENCOUNTER — Encounter: Payer: Self-pay | Admitting: Gastroenterology

## 2020-06-17 VITALS — BP 111/75 | HR 66 | Temp 97.5°F | Resp 14 | Ht 60.0 in | Wt 161.0 lb

## 2020-06-17 DIAGNOSIS — Z1211 Encounter for screening for malignant neoplasm of colon: Secondary | ICD-10-CM

## 2020-06-17 DIAGNOSIS — D124 Benign neoplasm of descending colon: Secondary | ICD-10-CM | POA: Diagnosis not present

## 2020-06-17 DIAGNOSIS — K319 Disease of stomach and duodenum, unspecified: Secondary | ICD-10-CM | POA: Diagnosis not present

## 2020-06-17 DIAGNOSIS — K2970 Gastritis, unspecified, without bleeding: Secondary | ICD-10-CM

## 2020-06-17 DIAGNOSIS — D123 Benign neoplasm of transverse colon: Secondary | ICD-10-CM

## 2020-06-17 DIAGNOSIS — R0989 Other specified symptoms and signs involving the circulatory and respiratory systems: Secondary | ICD-10-CM

## 2020-06-17 DIAGNOSIS — K219 Gastro-esophageal reflux disease without esophagitis: Secondary | ICD-10-CM

## 2020-06-17 MED ORDER — SODIUM CHLORIDE 0.9 % IV SOLN: 500.0000 mL | Freq: Once | INTRAVENOUS | Status: DC

## 2020-06-17 NOTE — Progress Notes (Signed)
[place]  Has had both doses of Covid vaccine

## 2020-06-17 NOTE — Op Note (Signed)
[facility] Patient Name: Tricia Potts Procedure Date: 06/17/2020 10:24 AM MRN: [id number] Endoscopist: Mauri Pole , MD Age: 49 Referring MD:  Date of Birth: 1971-03-25 Gender: Female Account #: [id number] Procedure:                Upper GI endoscopy Indications:              Dyspepsia, Esophageal reflux symptoms that recur                            despite appropriate therapy, Globus sensation Medicines:                Monitored Anesthesia Care Procedure:                Pre-Anesthesia Assessment:                           - Prior to the procedure, a History and Physical                            was performed, and patient medications and                            allergies were reviewed. The patient's tolerance of                            previous anesthesia was also reviewed. The risks                            and benefits of the procedure and the sedation                            options and risks were discussed with the patient.                            All questions were answered, and informed consent                            was obtained. Prior Anticoagulants: The patient has                            taken no previous anticoagulant or antiplatelet                            agents. ASA Grade Assessment: II - A patient with                            mild systemic disease. After reviewing the risks                            and benefits, the patient was deemed in                            satisfactory condition to undergo the procedure.  After obtaining informed consent, the endoscope was                            passed under direct vision. Throughout the                            procedure, the patient's blood pressure, pulse, and                            oxygen saturations were monitored continuously. The                            Endoscope was introduced through the mouth, and                            advanced  to the second part of duodenum. The upper                            GI endoscopy was accomplished without difficulty.                            The patient tolerated the procedure well. Scope In: Scope Out: Findings:                 The gastroesophageal flap valve was visualized                            endoscopically and classified as Hill Grade III                            (minimal fold, loose to endoscope, hiatal hernia                            likely).                           No gross lesions were noted in the entire esophagus.                           Patchy mild inflammation characterized by                            congestion (edema), erythema and mucus was found in                            the entire examined stomach. Biopsies were taken                            with a cold forceps for Helicobacter pylori testing.                           Patchy mildly erythematous mucosa without active                            bleeding was found in  the duodenal bulb.                           The second portion of the duodenum was normal. Complications:            No immediate complications. Estimated Blood Loss:     Estimated blood loss was minimal. Impression:               - Gastroesophageal flap valve classified as Hill                            Grade III (minimal fold, loose to endoscope, hiatal                            hernia likely).                           - No gross lesions in esophagus.                           - Gastritis. Biopsied.                           - Erythematous duodenopathy.                           - Normal second portion of the duodenum. Recommendation:           - Patient has a contact number available for                            emergencies. The signs and symptoms of potential                            delayed complications were discussed with the                            patient. Return to normal activities tomorrow.                             Written discharge instructions were provided to the                            patient.                           - Resume previous diet.                           - Continue present medications.                           - Await pathology results.                           - See the other procedure note for documentation of  additional recommendations. Mauri Pole, MD 06/17/2020 11:16:18 AM This report has been signed electronically.

## 2020-06-17 NOTE — Patient Instructions (Signed)
Handouts given for polyps, hemorrhoids, gastritis and hiatal hernia.   YOU HAD AN ENDOSCOPIC PROCEDURE TODAY AT [facility]:   Refer to the procedure report that was given to you for any specific questions about what was found during the examination.  If the procedure report does not answer your questions, please call your gastroenterologist to clarify.  If you requested that your care partner not be given the details of your procedure findings, then the procedure report has been included in a sealed envelope for you to review at your convenience later.  YOU SHOULD EXPECT: Some feelings of bloating in the abdomen. Passage of more gas than usual.  Walking can help get rid of the air that was put into your GI tract during the procedure and reduce the bloating. If you had a lower endoscopy (such as a colonoscopy or flexible sigmoidoscopy) you may notice spotting of blood in your stool or on the toilet paper. If you underwent a bowel prep for your procedure, you may not have a normal bowel movement for a few days.  Please Note:  You might notice some irritation and congestion in your nose or some drainage.  This is from the oxygen used during your procedure.  There is no need for concern and it should clear up in a day or so.  SYMPTOMS TO REPORT IMMEDIATELY:   Following lower endoscopy (colonoscopy or flexible sigmoidoscopy):  Excessive amounts of blood in the stool  Significant tenderness or worsening of abdominal pains  Swelling of the abdomen that is new, acute  Fever of 100F or higher   Following upper endoscopy (EGD)  Vomiting of blood or coffee ground material  New chest pain or pain under the shoulder blades  Painful or persistently difficult swallowing  New shortness of breath  Fever of 100F or higher  Black, tarry-looking stools  For urgent or emergent issues, a gastroenterologist can be reached at any hour by calling [phone]. Do not use MyChart messaging  for urgent concerns.    DIET:  We do recommend a small meal at first, but then you may proceed to your regular diet.  Drink plenty of fluids but you should avoid alcoholic beverages for 24 hours.  ACTIVITY:  You should plan to take it easy for the rest of today and you should NOT DRIVE or use heavy machinery until tomorrow (because of the sedation medicines used during the test).    FOLLOW UP: Our staff will call the number listed on your records 48-72 hours following your procedure to check on you and address any questions or concerns that you may have regarding the information given to you following your procedure. If we do not reach you, we will leave a message.  We will attempt to reach you two times.  During this call, we will ask if you have developed any symptoms of COVID 19. If you develop any symptoms (ie: fever, flu-like symptoms, shortness of breath, cough etc.) before then, please call [phone].  If you test positive for Covid 19 in the 2 weeks post procedure, please call and report this information to [place].    If any biopsies were taken you will be contacted by phone or by letter within the next 1-3 weeks.  Please call us at [phone] if you have not heard about the biopsies in 3 weeks.    SIGNATURES/CONFIDENTIALITY: You and/or your care partner have signed paperwork which will be entered into your electronic medical record.  These  signatures attest to the fact that that the information above on your After Visit Summary has been reviewed and is understood.  Full responsibility of the confidentiality of this discharge information lies with you and/or your care-partner. 

## 2020-06-17 NOTE — Progress Notes (Signed)
pt tolerated well. VSS. awake and to recovery. Report given to RN.  

## 2020-06-17 NOTE — Progress Notes (Signed)
Called to room to assist during endoscopic procedure.  Patient ID and intended procedure confirmed with present staff. Received instructions for my participation in the procedure from the performing physician.  

## 2020-06-17 NOTE — Op Note (Signed)
[facility] Patient Name: Tricia Potts Procedure Date: 06/17/2020 10:24 AM MRN: [id number] Endoscopist: Mauri Pole , MD Age: 49 Referring MD:  Date of Birth: 1971/02/04 Gender: Female Account #: [id number] Procedure:                Colonoscopy Indications:              Screening for colorectal malignant neoplasm Medicines:                Monitored Anesthesia Care Procedure:                Pre-Anesthesia Assessment:                           - Prior to the procedure, a History and Physical                            was performed, and patient medications and                            allergies were reviewed. The patient's tolerance of                            previous anesthesia was also reviewed. The risks                            and benefits of the procedure and the sedation                            options and risks were discussed with the patient.                            All questions were answered, and informed consent                            was obtained. Prior Anticoagulants: The patient has                            taken no previous anticoagulant or antiplatelet                            agents. ASA Grade Assessment: II - A patient with                            mild systemic disease. After reviewing the risks                            and benefits, the patient was deemed in                            satisfactory condition to undergo the procedure.                           After obtaining informed consent, the colonoscope  was passed under direct vision. Throughout the                            procedure, the patient's blood pressure, pulse, and                            oxygen saturations were monitored continuously. The                            Colonoscope was introduced through the anus and                            advanced to the the cecum, identified by                            appendiceal orifice and  ileocecal valve. The                            colonoscopy was performed without difficulty. The                            patient tolerated the procedure well. The quality                            of the bowel preparation was excellent. The                            ileocecal valve, appendiceal orifice, and rectum                            were photographed. Scope In: 10:44:06 AM Scope Out: 11:04:46 AM Scope Withdrawal Time: 0 hours 13 minutes 36 seconds  Total Procedure Duration: 0 hours 20 minutes 40 seconds  Findings:                 The perianal and digital rectal examinations were                            normal.                           A 5 mm polyp was found in the transverse colon. The                            polyp was sessile. The polyp was removed with a                            cold snare. Resection and retrieval were complete.                           Two sessile polyps were found in the descending                            colon and transverse colon. The polyps were 1 to 2  mm in size. These polyps were removed with a cold                            biopsy forceps. Resection and retrieval were                            complete.                           Non-bleeding internal hemorrhoids were found during                            retroflexion. The hemorrhoids were medium-sized. Complications:            No immediate complications. Estimated Blood Loss:     Estimated blood loss was minimal. Impression:               - One 5 mm polyp in the transverse colon, removed                            with a cold snare. Resected and retrieved.                           - Two 1 to 2 mm polyps in the descending colon and                            in the transverse colon, removed with a cold biopsy                            forceps. Resected and retrieved.                           - Non-bleeding internal hemorrhoids. Recommendation:            - Patient has a contact number available for                            emergencies. The signs and symptoms of potential                            delayed complications were discussed with the                            patient. Return to normal activities tomorrow.                            Written discharge instructions were provided to the                            patient.                           - Resume previous diet.                           - Continue present medications.                           -  Await pathology results.                           - Repeat colonoscopy in 3 - 5 years for                            surveillance based on pathology results. Mauri Pole, MD 06/17/2020 11:19:14 AM This report has been signed electronically.

## 2020-06-20 MED FILL — FAMOTIDINE 40 MG TABS: 40 | 90 days supply | Qty: 90 | Fill #1

## 2020-06-21 ENCOUNTER — Telehealth: Payer: Self-pay | Admitting: *Deleted

## 2020-06-21 NOTE — Telephone Encounter (Signed)
  Follow up Call-  Call back number 06/17/2020  Post procedure Call Back phone  # [phone]  Permission to leave phone message Yes  Some recent data might be hidden     Patient questions:  Do you have a fever, pain , or abdominal swelling? No. Pain Score  0 *  Have you tolerated food without any problems? Yes.    Have you been able to return to your normal activities? Yes.    Do you have any questions about your discharge instructions: Diet   No. Medications  No. Follow up visit  No.  Do you have questions or concerns about your Care? No.  Actions: * If pain score is 4 or above: No action needed, pain <4.  1. Have you developed a fever since your procedure? no  2.   Have you had an respiratory symptoms (SOB or cough) since your procedure? no  3.   Have you tested positive for COVID 19 since your procedure no  4.   Have you had any family members/close contacts diagnosed with the COVID 19 since your procedure?  no   If yes to any of these questions please route to Joylene John, RN and Erenest Rasher, RN

## 2020-06-23 ENCOUNTER — Encounter: Payer: Self-pay | Admitting: Gastroenterology

## 2020-06-28 MED FILL — ATORVASTATIN CALCIUM 10 MG: 10 | 90 days supply | Qty: 90 | Fill #1

## 2020-06-30 ENCOUNTER — Ambulatory Visit: Payer: No Typology Code available for payment source | Admitting: Family Medicine

## 2020-07-25 ENCOUNTER — Other Ambulatory Visit: Payer: Self-pay | Admitting: Family Medicine

## 2020-07-25 MED FILL — VENLAFAXINE HCL ER 75 MG CA: 75 | 90 days supply | Qty: 90 | Fill #0

## 2020-07-25 MED FILL — FLUTICASONE PROP 50 MCG SPR: 50 | 90 days supply | Qty: 48 | Fill #0

## 2020-07-26 ENCOUNTER — Other Ambulatory Visit: Payer: Self-pay | Admitting: *Deleted

## 2020-07-26 ENCOUNTER — Other Ambulatory Visit: Payer: Self-pay | Admitting: Family Medicine

## 2020-07-26 MED ORDER — VENLAFAXINE HCL ER 75 MG PO CP24: 75.0000 mg | ORAL_CAPSULE | Freq: Every day | ORAL | 1 refills | Status: DC

## 2020-08-22 ENCOUNTER — Encounter: Payer: Self-pay | Admitting: Family

## 2020-08-22 NOTE — Telephone Encounter (Signed)
Can you forward to billing

## 2020-09-27 ENCOUNTER — Other Ambulatory Visit: Payer: Self-pay | Admitting: Family Medicine

## 2020-09-27 MED FILL — FAMOTIDINE 40 MG TABS: 40 | 90 days supply | Qty: 90 | Fill #0

## 2020-10-15 MED FILL — ATORVASTATIN CALCIUM 10 MG: 10 | 90 days supply | Qty: 90 | Fill #2

## 2020-10-25 MED FILL — VENLAFAXINE HCL ER 75 MG CA: 75 | 90 days supply | Qty: 90 | Fill #1

## 2020-11-09 ENCOUNTER — Encounter: Payer: Self-pay | Admitting: Family Medicine

## 2020-11-09 ENCOUNTER — Other Ambulatory Visit: Payer: Self-pay | Admitting: Family Medicine

## 2020-11-09 MED ORDER — CLINDAMYCIN PHOS-BENZOYL PEROX 1-5 % EX GEL: Freq: Two times a day (BID) | CUTANEOUS | 1 refills | Status: DC

## 2020-11-09 MED FILL — CLINDAMYCIN PHOS-BENZOYL PE: 1-5 | 30 days supply | Qty: 25 | Fill #0

## 2020-11-14 ENCOUNTER — Encounter: Payer: No Typology Code available for payment source | Admitting: Family Medicine

## 2020-11-14 ENCOUNTER — Other Ambulatory Visit: Payer: Self-pay | Admitting: Family Medicine

## 2020-11-14 ENCOUNTER — Encounter: Payer: Self-pay | Admitting: Family Medicine

## 2020-11-14 MED ORDER — PERMETHRIN 5 % EX CREA: 1.0000 "application " | TOPICAL_CREAM | Freq: Once | CUTANEOUS | 1 refills | Status: DC

## 2020-11-14 MED FILL — PERMETHRIN 5 % CREA: 5 | 7 days supply | Qty: 60 | Fill #0

## 2020-11-14 MED FILL — SM FEXOFENADINE 180 MG TAB: 180 MG | 90 days supply | Qty: 90 | Fill #0

## 2020-12-06 ENCOUNTER — Encounter: Payer: Self-pay | Admitting: Family Medicine

## 2020-12-07 ENCOUNTER — Other Ambulatory Visit: Payer: Self-pay | Admitting: Family Medicine

## 2020-12-07 DIAGNOSIS — L309 Dermatitis, unspecified: Secondary | ICD-10-CM

## 2020-12-29 MED FILL — FAMOTIDINE 40 MG TABS: 40 | 90 days supply | Qty: 90 | Fill #1

## 2021-01-04 ENCOUNTER — Encounter: Payer: Self-pay | Admitting: Family Medicine

## 2021-01-22 MED FILL — ATORVASTATIN CALCIUM 10 MG: 10 | 90 days supply | Qty: 90 | Fill #3

## 2021-01-23 MED FILL — FLUTICASONE PROP 50 MCG SPR: 50 | 90 days supply | Qty: 48 | Fill #1

## 2021-01-23 MED FILL — VENLAFAXINE HCL ER 75 MG CA: 75 | 90 days supply | Qty: 90 | Fill #0

## 2021-01-26 ENCOUNTER — Other Ambulatory Visit: Payer: Self-pay | Admitting: Family Medicine

## 2021-01-26 ENCOUNTER — Other Ambulatory Visit: Payer: Self-pay | Admitting: *Deleted

## 2021-01-26 MED ORDER — VENLAFAXINE HCL ER 75 MG PO CP24: 75.0000 mg | ORAL_CAPSULE | Freq: Every day | ORAL | 1 refills | Status: DC

## 2021-02-01 ENCOUNTER — Other Ambulatory Visit (HOSPITAL_COMMUNITY): Payer: Self-pay | Admitting: Physician Assistant

## 2021-02-01 MED FILL — CLOBETASOL PROPIONATE 0.05: 0.05 | 14 days supply | Qty: 30 | Fill #0

## 2021-02-01 MED FILL — CLINDAMYCIN PHOSPHATE 1 % L: 1 | 14 days supply | Qty: 60 | Fill #0

## 2021-02-08 MED FILL — EUCRISA 2% OINTMENT: 2 | 30 days supply | Qty: 60 | Fill #0

## 2021-02-15 ENCOUNTER — Encounter (INDEPENDENT_AMBULATORY_CARE_PROVIDER_SITE_OTHER): Payer: No Typology Code available for payment source | Admitting: Ophthalmology

## 2021-02-27 MED FILL — SM FEXOFENADINE 180 MG TAB: 180 MG | 90 days supply | Qty: 90 | Fill #1

## 2021-03-09 ENCOUNTER — Other Ambulatory Visit (HOSPITAL_COMMUNITY): Payer: Self-pay | Admitting: Pharmacist

## 2021-03-23 ENCOUNTER — Telehealth: Payer: Self-pay | Admitting: Pharmacist

## 2021-03-23 ENCOUNTER — Other Ambulatory Visit: Payer: Self-pay | Admitting: Internal Medicine

## 2021-03-23 ENCOUNTER — Ambulatory Visit: Payer: No Typology Code available for payment source | Attending: Family Medicine | Admitting: Pharmacist

## 2021-03-23 ENCOUNTER — Other Ambulatory Visit: Payer: Self-pay

## 2021-03-23 DIAGNOSIS — Z79899 Other long term (current) drug therapy: Secondary | ICD-10-CM

## 2021-03-23 MED ORDER — DUPIXENT 300 MG/2ML ~~LOC~~ SOAJ: SUBCUTANEOUS | 2 refills | Status: DC

## 2021-03-23 MED FILL — DUPIXENT 300 MG/2ML SOPN: 300 | 28 days supply | Qty: 4 | Fill #0

## 2021-03-23 NOTE — Telephone Encounter (Signed)
Called patient to schedule an appointment for the [facility] [facility]. I was unable to reach the patient so I left a HIPAA-compliant message requesting that the patient return my call.   Luke Van Ausdall, PharmD, BCACP, CPP Clinical Pharmacist [facility] [phone]  

## 2021-03-23 NOTE — Progress Notes (Signed)
   S: Patient presents for review of their specialty medication therapy.  Patient is currently taking Dupixent for atopic dermatitis. Patient is managed by Dr. Rosette Reveal for this.   Adherence: confirms. Completed her loading dose last week (03/15/21).   Efficacy: reports that she can already notice an improvement in her skin.   Dosing: 600mg  x1 followed by 300 mg subq Every 14 days.   Dose adjustments: Renal: no dose adjustments (has not been studied) Hepatic: no dose adjustments (has not been studied)  Drug-drug interactions: none identified   Screening: TB test: completed   Monitoring: S/sx of infection: none  S/sx of hypersensitivity: none  S/sx of ocular effects: none  S/sx of eosinophilia/vasculitis: none   O:     Lab Results  Component Value Date   WBC 6.5 03/28/2020   HGB 13.6 03/28/2020   HCT 39.6 03/28/2020   MCV 91.0 03/28/2020   PLT 280.0 03/28/2020      Chemistry      Component Value Date/Time   NA 136 03/28/2020 1623   K 3.7 03/28/2020 1623   CL 103 03/28/2020 1623   CO2 26 03/28/2020 1623   BUN 8 03/28/2020 1623   CREATININE 0.65 03/28/2020 1623   CREATININE 0.63 05/31/2014 0913      Component Value Date/Time   CALCIUM 9.4 03/28/2020 1623   CALCIUM 9.7 04/07/2010 0000   ALKPHOS 67 03/28/2020 1623   AST 24 03/28/2020 1623   ALT 26 03/28/2020 1623   BILITOT 0.4 03/28/2020 1623       A/P: 1. Medication review: Patient currently on [place] for atopic dermatitis. Reviewed the medication with the patient, including the following: Dupixent is a monoclonal antibody used for the treatment of asthma or atopic dermatitis. Patient educated on purpose, proper use and potential adverse effects of Dupixent. Possible adverse effects include increased risk of infection, ocular effects, vasculitis/eosinophilia, and hypersensitivity reactions. Administer as a SubQ injection and rotate sites. Allow the medication to reach room temp prior to administration (45 mins  for 300 mg syringe or 30 min for 200 mg syringe). Do not shake. Discard any unused portion. No recommendations for any changes.   Benard Halsted, PharmD, [facility] [place], [place] [phone]

## 2021-03-25 ENCOUNTER — Other Ambulatory Visit (HOSPITAL_COMMUNITY): Payer: Self-pay

## 2021-03-25 MED FILL — Clindamycin Phosphate Lotion 1%: CUTANEOUS | 14 days supply | Qty: 60 | Fill #0 | Status: AC

## 2021-03-26 ENCOUNTER — Other Ambulatory Visit (HOSPITAL_COMMUNITY): Payer: Self-pay

## 2021-03-27 ENCOUNTER — Other Ambulatory Visit (HOSPITAL_COMMUNITY): Payer: Self-pay

## 2021-04-04 ENCOUNTER — Other Ambulatory Visit: Payer: Self-pay | Admitting: Family Medicine

## 2021-04-04 ENCOUNTER — Other Ambulatory Visit (HOSPITAL_COMMUNITY): Payer: Self-pay

## 2021-04-04 MED ORDER — ATORVASTATIN CALCIUM 10 MG PO TABS
ORAL_TABLET | Freq: Every day | ORAL | 3 refills | Status: DC
  Filled 2021-04-04: qty 90, 90d supply, fill #0
  Filled 2021-08-02: qty 90, 90d supply, fill #1
  Filled 2021-11-13: qty 90, 90d supply, fill #2
  Filled 2022-02-18: qty 90, 90d supply, fill #3

## 2021-04-05 ENCOUNTER — Other Ambulatory Visit (HOSPITAL_COMMUNITY): Payer: Self-pay

## 2021-04-11 ENCOUNTER — Other Ambulatory Visit: Payer: Self-pay | Admitting: Family Medicine

## 2021-04-12 ENCOUNTER — Other Ambulatory Visit (HOSPITAL_COMMUNITY): Payer: Self-pay

## 2021-04-12 MED ORDER — FAMOTIDINE 40 MG PO TABS
ORAL_TABLET | Freq: Every day | ORAL | 1 refills | Status: DC
  Filled 2021-04-12: qty 90, 90d supply, fill #0
  Filled 2021-07-13: qty 90, 90d supply, fill #1

## 2021-04-18 ENCOUNTER — Other Ambulatory Visit: Payer: Self-pay | Admitting: Family Medicine

## 2021-04-18 MED ORDER — FLUTICASONE PROPIONATE 50 MCG/ACT NA SUSP
NASAL | 1 refills | Status: DC
  Filled 2021-04-18: qty 48, 90d supply, fill #0
  Filled 2022-01-09: qty 48, 90d supply, fill #1

## 2021-04-19 ENCOUNTER — Other Ambulatory Visit (HOSPITAL_COMMUNITY): Payer: Self-pay

## 2021-04-20 ENCOUNTER — Other Ambulatory Visit (HOSPITAL_COMMUNITY): Payer: Self-pay

## 2021-04-26 ENCOUNTER — Other Ambulatory Visit (HOSPITAL_COMMUNITY): Payer: Self-pay

## 2021-04-26 MED ORDER — CLINDAMYCIN PHOSPHATE 1 % EX SOLN
CUTANEOUS | 3 refills | Status: DC
  Filled 2021-04-26: qty 60, 30d supply, fill #0

## 2021-05-01 ENCOUNTER — Other Ambulatory Visit (HOSPITAL_COMMUNITY): Payer: Self-pay

## 2021-05-03 ENCOUNTER — Other Ambulatory Visit (HOSPITAL_COMMUNITY): Payer: Self-pay

## 2021-05-04 ENCOUNTER — Other Ambulatory Visit (HOSPITAL_COMMUNITY): Payer: Self-pay

## 2021-05-05 ENCOUNTER — Other Ambulatory Visit (HOSPITAL_COMMUNITY): Payer: Self-pay

## 2021-05-05 MED FILL — Venlafaxine HCl Cap ER 24HR 75 MG (Base Equivalent): ORAL | 90 days supply | Qty: 90 | Fill #0 | Status: AC

## 2021-05-08 ENCOUNTER — Other Ambulatory Visit (HOSPITAL_COMMUNITY): Payer: Self-pay

## 2021-05-10 ENCOUNTER — Other Ambulatory Visit (HOSPITAL_COMMUNITY): Payer: Self-pay

## 2021-05-17 ENCOUNTER — Other Ambulatory Visit (HOSPITAL_COMMUNITY): Payer: Self-pay

## 2021-05-17 MED FILL — Dupilumab Subcutaneous Soln Auto-injector 300 MG/2ML: SUBCUTANEOUS | 28 days supply | Qty: 4 | Fill #0 | Status: AC

## 2021-06-01 ENCOUNTER — Other Ambulatory Visit: Payer: Self-pay

## 2021-06-01 ENCOUNTER — Other Ambulatory Visit (HOSPITAL_COMMUNITY): Payer: Self-pay

## 2021-06-01 ENCOUNTER — Ambulatory Visit (INDEPENDENT_AMBULATORY_CARE_PROVIDER_SITE_OTHER): Payer: No Typology Code available for payment source | Admitting: Family Medicine

## 2021-06-01 VITALS — BP 108/76 | HR 72 | Temp 98.3°F | Resp 16 | Ht 60.0 in | Wt 160.4 lb

## 2021-06-01 DIAGNOSIS — Z1159 Encounter for screening for other viral diseases: Secondary | ICD-10-CM | POA: Diagnosis not present

## 2021-06-01 DIAGNOSIS — Z23 Encounter for immunization: Secondary | ICD-10-CM

## 2021-06-01 DIAGNOSIS — F419 Anxiety disorder, unspecified: Secondary | ICD-10-CM

## 2021-06-01 DIAGNOSIS — R739 Hyperglycemia, unspecified: Secondary | ICD-10-CM

## 2021-06-01 DIAGNOSIS — F32A Depression, unspecified: Secondary | ICD-10-CM

## 2021-06-01 DIAGNOSIS — R7989 Other specified abnormal findings of blood chemistry: Secondary | ICD-10-CM

## 2021-06-01 DIAGNOSIS — Z0000 Encounter for general adult medical examination without abnormal findings: Secondary | ICD-10-CM | POA: Diagnosis not present

## 2021-06-01 DIAGNOSIS — E782 Mixed hyperlipidemia: Secondary | ICD-10-CM | POA: Diagnosis not present

## 2021-06-01 DIAGNOSIS — J45909 Unspecified asthma, uncomplicated: Secondary | ICD-10-CM

## 2021-06-01 DIAGNOSIS — M8580 Other specified disorders of bone density and structure, unspecified site: Secondary | ICD-10-CM | POA: Diagnosis not present

## 2021-06-01 LAB — LIPID PANEL
Cholesterol: 198 mg/dL (ref 0–200)
HDL: 52.3 mg/dL (ref >39.00)
NonHDL: 146.12
Total CHOL/HDL Ratio: 4
Triglycerides: 259 mg/dL — ABNORMAL HIGH (ref 0.0–149.0)
VLDL: 51.8 mg/dL — ABNORMAL HIGH (ref 0.0–40.0)

## 2021-06-01 LAB — CBC WITH DIFFERENTIAL/PLATELET
Basophils Absolute: 0 10*3/uL (ref 0.0–0.1)
Basophils Relative: 0.5 % (ref 0.0–3.0)
Eosinophils Absolute: 0.2 10*3/uL (ref 0.0–0.7)
Eosinophils Relative: 5.3 % — ABNORMAL HIGH (ref 0.0–5.0)
HCT: 41.3 % (ref 36.0–46.0)
Hemoglobin: 14 g/dL (ref 12.0–15.0)
Lymphocytes Relative: 39.5 % (ref 12.0–46.0)
Lymphs Abs: 1.6 10*3/uL (ref 0.7–4.0)
MCHC: 34 g/dL (ref 30.0–36.0)
MCV: 91.9 fl (ref 78.0–100.0)
Monocytes Absolute: 0.3 10*3/uL (ref 0.1–1.0)
Monocytes Relative: 7.6 % (ref 3.0–12.0)
Neutro Abs: 1.9 10*3/uL (ref 1.4–7.7)
Neutrophils Relative %: 47.1 % (ref 43.0–77.0)
Platelets: 289 10*3/uL (ref 150.0–400.0)
RBC: 4.49 Mil/uL (ref 3.87–5.11)
RDW: 12.5 % (ref 11.5–15.5)
WBC: 4 10*3/uL (ref 4.0–10.5)

## 2021-06-01 LAB — COMPREHENSIVE METABOLIC PANEL
ALT: 21 U/L (ref 0–35)
AST: 21 U/L (ref 0–37)
Albumin: 4.5 g/dL (ref 3.5–5.2)
Alkaline Phosphatase: 66 U/L (ref 39–117)
BUN: 12 mg/dL (ref 6–23)
CO2: 27 mEq/L (ref 19–32)
Calcium: 9.5 mg/dL (ref 8.4–10.5)
Chloride: 105 mEq/L (ref 96–112)
Creatinine, Ser: 0.69 mg/dL (ref 0.40–1.20)
GFR: 99.89 mL/min (ref >60.00)
Glucose, Bld: 89 mg/dL (ref 70–99)
Potassium: 4.4 mEq/L (ref 3.5–5.1)
Sodium: 140 mEq/L (ref 135–145)
Total Bilirubin: 0.5 mg/dL (ref 0.2–1.2)
Total Protein: 7.5 g/dL (ref 6.0–8.3)

## 2021-06-01 LAB — HEMOGLOBIN A1C: Hgb A1c MFr Bld: 5.6 % (ref 4.6–6.5)

## 2021-06-01 LAB — LDL CHOLESTEROL, DIRECT: Direct LDL: 112 mg/dL

## 2021-06-01 LAB — VITAMIN D 25 HYDROXY (VIT D DEFICIENCY, FRACTURES): VITD: 31.21 ng/mL (ref 30.00–100.00)

## 2021-06-01 LAB — TSH: TSH: 3.4 u[IU]/mL (ref 0.35–4.50)

## 2021-06-01 MED ORDER — ALBUTEROL SULFATE HFA 108 (90 BASE) MCG/ACT IN AERS
2.0000 | INHALATION_SPRAY | Freq: Four times a day (QID) | RESPIRATORY_TRACT | 2 refills | Status: AC | PRN
  Filled 2021-06-01 – 2022-02-18 (×2): qty 18, 25d supply, fill #0

## 2021-06-01 MED ORDER — FLUTICASONE-SALMETEROL 250-50 MCG/ACT IN AEPB
1.0000 | INHALATION_SPRAY | Freq: Two times a day (BID) | RESPIRATORY_TRACT | 2 refills | Status: DC | PRN
  Filled 2021-06-01: qty 60, 30d supply, fill #0

## 2021-06-01 NOTE — Assessment & Plan Note (Signed)
Given pneumonia shot today

## 2021-06-01 NOTE — Assessment & Plan Note (Signed)
hgba1c acceptable, minimize simple carbs. Increase exercise as tolerated.  

## 2021-06-01 NOTE — Assessment & Plan Note (Signed)
Encouraged heart healthy diet, increase exercise, avoid trans fats, consider a krill oil cap daily 

## 2021-06-01 NOTE — Patient Instructions (Signed)
Mammogram some time between August and February  Preventive Care 34-52 Years Old, Female Preventive care refers to lifestyle choices and visits with your health care provider that can promote health and wellness. This includes: A yearly physical exam. This is also called an annual wellness visit. Regular dental and eye exams. Immunizations. Screening for certain conditions. Healthy lifestyle choices, such as: Eating a healthy diet. Getting regular exercise. Not using drugs or products that contain nicotine and tobacco. Limiting alcohol use. What can I expect for my preventive care visit? Physical exam Your health care provider will check your: Height and weight. These may be used to calculate your BMI (body mass index). BMI is a measurement that tells if you are at a healthy weight. Heart rate and blood pressure. Body temperature. Skin for abnormal spots. Counseling Your health care provider may ask you questions about your: Past medical problems. Family's medical history. Alcohol, tobacco, and drug use. Emotional well-being. Home life and relationship well-being. Sexual activity. Diet, exercise, and sleep habits. Work and work Statistician. Access to firearms. Method of birth control. Menstrual cycle. Pregnancy history. What immunizations do I need? Vaccines are usually given at various ages, according to a schedule. Your health care provider will recommend vaccines for you based on your age, medical history, and lifestyle or other factors, such as travel or where you work.   What tests do I need? Blood tests Lipid and cholesterol levels. These may be checked every 5 years, or more often if you are over 52 years old. Hepatitis C test. Hepatitis B test. Screening Lung cancer screening. You may have this screening every year starting at age 40 if you have a 30-pack-year history of smoking and currently smoke or have quit within the past 15 years. Colorectal cancer  screening. All adults should have this screening starting at age 46 and continuing until age 57. Your health care provider may recommend screening at age 52 if you are at increased risk. You will have tests every 1-10 years, depending on your results and the type of screening test. Diabetes screening. This is done by checking your blood sugar (glucose) after you have not eaten for a while (fasting). You may have this done every 1-3 years. Mammogram. This may be done every 1-2 years. Talk with your health care provider about when you should start having regular mammograms. This may depend on whether you have a family history of breast cancer. BRCA-related cancer screening. This may be done if you have a family history of breast, ovarian, tubal, or peritoneal cancers. Pelvic exam and Pap test. This may be done every 3 years starting at age 52. Starting at age 13, this may be done every 5 years if you have a Pap test in combination with an HPV test. Other tests STD (sexually transmitted disease) testing, if you are at risk. Bone density scan. This is done to screen for osteoporosis. You may have this scan if you are at high risk for osteoporosis. Talk with your health care provider about your test results, treatment options, and if necessary, the need for more tests. Follow these instructions at home: Eating and drinking Eat a diet that includes fresh fruits and vegetables, whole grains, lean protein, and low-fat dairy products. Take vitamin and mineral supplements as recommended by your health care provider. Do not drink alcohol if: Your health care provider tells you not to drink. You are pregnant, may be pregnant, or are planning to become pregnant. If you drink alcohol: Limit how  much you have to 0-1 drink a day. Be aware of how much alcohol is in your drink. In the [place] one drink equals one 12 oz bottle of beer (355 mL), one 5 oz glass of wine (148 mL), or one 1 oz glass of hard liquor  (44 mL).   Lifestyle Take daily care of your teeth and gums. Brush your teeth every morning and night with fluoride toothpaste. Floss one time each day. Stay active. Exercise for at least 30 minutes 5 or more days each week. Do not use any products that contain nicotine or tobacco, such as cigarettes, e-cigarettes, and chewing tobacco. If you need help quitting, ask your health care provider. Do not use drugs. If you are sexually active, practice safe sex. Use a condom or other form of protection to prevent STIs (sexually transmitted infections). If you do not wish to become pregnant, use a form of birth control. If you plan to become pregnant, see your health care provider for a prepregnancy visit. If told by your health care provider, take low-dose aspirin daily starting at age 52. Find healthy ways to cope with stress, such as: Meditation, yoga, or listening to music. Journaling. Talking to a trusted person. Spending time with friends and family. Safety Always wear your seat belt while driving or riding in a vehicle. Do not drive: If you have been drinking alcohol. Do not ride with someone who has been drinking. When you are tired or distracted. While texting. Wear a helmet and other protective equipment during sports activities. If you have firearms in your house, make sure you follow all gun safety procedures. What's next? Visit your health care provider once a year for an annual wellness visit. Ask your health care provider how often you should have your eyes and teeth checked. Stay up to date on all vaccines. This information is not intended to replace advice given to you by your health care provider. Make sure you discuss any questions you have with your health care provider. Document Revised: 09/13/2020 Document Reviewed: 08/21/2018 Elsevier Patient Education  2021 [employer].

## 2021-06-01 NOTE — Assessment & Plan Note (Addendum)
Patient encouraged to maintain heart healthy diet, regular exercise, adequate sleep. Consider daily probiotics. Take medications as prescribed. Labs reviewed and ordered. MGM UTD and due August 2022. Last colonoscopy 2021 due again 2024. Pap sue 2024

## 2021-06-01 NOTE — Assessment & Plan Note (Signed)
Doing well despite no contact with youngest child. Still contact with the family so she is doing OK, no changes

## 2021-06-01 NOTE — Assessment & Plan Note (Signed)
Supplement and monitor 

## 2021-06-01 NOTE — Assessment & Plan Note (Signed)
Encouraged to get adequate exercise, calcium and vitamin d intake 

## 2021-06-01 NOTE — Progress Notes (Signed)
Patient ID: Tricia Potts, female    DOB: [birth year]  Age: 50 y.o. MRN: [id number]    Subjective:  Subjective  HPI Tricia Potts presents for comprehensive physical exam today and follow up on management of chronic concerns. She states that she had not experienced any recent hospitalizations or ER visits to report. She reports that her asthma has been stable and did not have to use her albuterol since her last pneumonia infection that has occurred a year and a half ago. As a result, she states that she does not have enough Albuterol to use if she was to ever need it and expresses interest in getting another refill to use PRN. She denies any chest pain, SOB, fever, abdominal pain, cough, chills, sore throat, dysuria, urinary incontinence, back pain, HA, or N/VD. She endorses using about 1000 IU of vitamin D supplements. She reports that sometimes she has trouble with waking up in the middle of the night, but she states that she is able to go back to sleep after with no issues.   Review of Systems  Constitutional:  Negative for chills, fatigue and fever.  HENT:  Negative for congestion, rhinorrhea, sinus pressure, sinus pain and sore throat.   Eyes:  Negative for pain.  Respiratory:  Negative for cough and shortness of breath.   Cardiovascular:  Negative for chest pain, palpitations and leg swelling.  Gastrointestinal:  Negative for abdominal pain, blood in stool, diarrhea, nausea and vomiting.  Genitourinary:  Negative for decreased urine volume, flank pain, frequency, vaginal bleeding and vaginal discharge.  Musculoskeletal:  Negative for back pain.  Neurological:  Negative for headaches.   History Past Medical History:  Diagnosis Date   Allergy    Anemia 06/18/2014   Anxiety    Anxiety and depression 02/17/2009   Qualifier: Diagnosis of  By: Redmond Pulling MD, LauraLee     Asthma    Broken ankle 2011   (left) roller skating   Bronchitis, mucopurulent recurrent ([place]) 04/21/2013   Cervical cancer  screening 02/20/2016   Depression    GERD (gastroesophageal reflux disease)    Hemorrhoid    History of hidradenitis suppurativa    Hyperlipemia    IBS (irritable bowel syndrome)    Kidney infection    as a child   Low back pain 05/25/2016   Osteopenia    PCO (polycystic ovaries)    Pruritus 08/22/2017    She has a past surgical history that includes Hemorrhoid surgery and Ankle surgery.   Her family history includes Allergies in her daughter, mother, and sister; Anxiety disorder in her daughter; Arthritis in an other family member; COPD in her mother; Cancer in her maternal grandmother; Cancer (age of onset: 53) in her brother; Cancer (age of onset: 58) in her sister; Eczema in her sister; GER disease in her father; Heart disease in her daughter, father, and maternal grandfather; Heart failure in her father; Hepatitis C in her brother; Irritable bowel syndrome in her father, mother, and niece; Kidney disease in her paternal grandfather and paternal grandmother; Ovarian cancer in an other family member.She reports that she has never smoked. She has never used smokeless tobacco. She reports that she does not drink alcohol and does not use drugs.  Current Outpatient Medications on File Prior to Visit  Medication Sig Dispense Refill   ALPRAZolam (XANAX) 0.25 MG tablet TAKE 1 TABLET BY MOUTH 2 TIMES DAILY AS NEEDED FOR ANXIETY 40 tablet 1   atorvastatin (LIPITOR) 10 MG tablet TAKE 1 TABLET  BY MOUTH ONCE DAILY 90 tablet 3   CALCIUM CITRATE PO Take 2 tablets by mouth daily.     Cholecalciferol (VITAMIN D) 50 MCG (2000 UT) CAPS Take by mouth.     Crisaborole 2 % OINT APPLY TO FINGERTIPS 2 TIMES DAILY 60 g 1   Dupilumab 300 MG/2ML SOPN INJECT ONE PEN UNDER THE SKIN EVERY OTHER WEEK. 4 mL 2   famotidine (PEPCID) 40 MG tablet TAKE 1 TABLET BY MOUTH AT BEDTIME 90 tablet 1   fexofenadine (ALLEGRA) 180 MG tablet TAKE 1 TABLET BY MOUTH DAILY. 90 tablet 1   fluticasone (FLONASE) 50 MCG/ACT nasal spray  PLACE 2 SPRAYS INTO THE NOSE DAILY 48 g 1   hydrocortisone cream 1 % Apply 1 application topically as needed for itching.     ibuprofen (ADVIL,MOTRIN) 200 MG tablet Take 400-800 mg by mouth every 6 (six) hours as needed for headache or mild pain.     montelukast (SINGULAIR) 10 MG tablet Take 1 tablet (10 mg total) by mouth at bedtime as needed. 90 tablet 3   Multiple Vitamin (MULTI-VITAMIN DAILY PO) Take by mouth daily.     olopatadine (PATANOL) 0.1 % ophthalmic solution PLACE 1 DROP INTO BOTH EYES 2 TIMES DAILY. 5 mL 12   OVER THE COUNTER MEDICATION Place 1 drop into both eyes as needed (allergy symptoms). OTC eye drops  Systane/Artificial Tears/Homeopathic eye drops     triamcinolone cream (KENALOG) 0.1 % Apply 1 application topically 2 (two) times daily. 80 g 1   venlafaxine XR (EFFEXOR-XR) 75 MG 24 hr capsule TAKE 1 CAPSULE BY MOUTH DAILY WITH BREAKFAST 90 capsule 1   clindamycin (CLEOCIN T) 1 % external solution Apply a small amount to skin 2-3 times a week for acne 60 mL 3   COVID-19 At Home Antigen Test KIT USE AS DIRECTED WITHIN PACKAGE INSTRUCTIONS 4 kit 0   permethrin (ELIMITE) 5 % cream APPLY ONCE FOR 1 DOSE AS DIRECTED 60 g 1   No current facility-administered medications on file prior to visit.     Objective:  Objective  Physical Exam Constitutional:      General: She is not in acute distress.    Appearance: Normal appearance. She is not ill-appearing or toxic-appearing.  HENT:     Head: Normocephalic and atraumatic.     Right Ear: Tympanic membrane, ear canal and external ear normal.     Left Ear: Tympanic membrane, ear canal and external ear normal.     Nose: No congestion or rhinorrhea.  Eyes:     Extraocular Movements: Extraocular movements intact.     Pupils: Pupils are equal, round, and reactive to light.  Cardiovascular:     Rate and Rhythm: Normal rate and regular rhythm.     Pulses: Normal pulses.     Heart sounds: Normal heart sounds. No murmur  heard. Pulmonary:     Effort: Pulmonary effort is normal. No respiratory distress.     Breath sounds: Normal breath sounds. No wheezing, rhonchi or rales.  Abdominal:     General: Bowel sounds are normal.     Palpations: Abdomen is soft. There is no mass.     Tenderness: no abdominal tenderness There is no guarding.     Hernia: No hernia is present.  Musculoskeletal:        General: Normal range of motion.     Cervical back: Normal range of motion and neck supple.  Skin:    General: Skin is warm and dry.  Neurological:     Mental Status: She is alert and oriented to person, place, and time.  Psychiatric:        Behavior: Behavior normal.   BP 108/76   Pulse 72   Temp 98.3 F (36.8 C)   Resp 16   Ht 5' (1.524 m)   Wt 160 lb 6.4 oz (72.8 kg)   SpO2 94%   BMI 31.33 kg/m  Wt Readings from Last 3 Encounters:  06/01/21 160 lb 6.4 oz (72.8 kg)  06/17/20 161 lb (73 kg)  05/30/20 161 lb (73 kg)     Lab Results  Component Value Date   WBC 6.5 03/28/2020   HGB 13.6 03/28/2020   HCT 39.6 03/28/2020   PLT 280.0 03/28/2020   GLUCOSE 90 03/28/2020   CHOL 225 (H) 01/19/2020   TRIG (H) 01/19/2020    472.0 Triglyceride is over 400; calculations on Lipids are invalid.   HDL 49.80 01/19/2020   LDLDIRECT 128.0 01/19/2020   LDLCALC 154 (H) 09/19/2018   ALT 26 03/28/2020   AST 24 03/28/2020   NA 136 03/28/2020   K 3.7 03/28/2020   CL 103 03/28/2020   CREATININE 0.65 03/28/2020   BUN 8 03/28/2020   CO2 26 03/28/2020   TSH 2.91 01/19/2020    No results found.   Assessment & Plan:  Plan    Meds ordered this encounter  Medications   albuterol (VENTOLIN HFA) 108 (90 Base) MCG/ACT inhaler    Sig: Inhale 2 puffs into the lungs every 6 (six) hours as needed for wheezing or shortness of breath.    Dispense:  18 g    Refill:  2   fluticasone-salmeterol (ADVAIR DISKUS) 250-50 MCG/ACT AEPB    Sig: Inhale 1 puff into the lungs 2 (two) times daily as needed.    Dispense:  60 each     Refill:  2    Problem List Items Addressed This Visit     Hyperlipidemia, mixed    Encouraged heart healthy diet, increase exercise, avoid trans fats, consider a krill oil cap daily       Anxiety and depression    Doing well despite no contact with youngest child. Still contact with the family so she is doing OK, no changes       Asthma    Given pneumonia shot today       Relevant Medications   albuterol (VENTOLIN HFA) 108 (90 Base) MCG/ACT inhaler   fluticasone-salmeterol (ADVAIR DISKUS) 250-50 MCG/ACT AEPB   Preventative health care - Primary    Patient encouraged to maintain heart healthy diet, regular exercise, adequate sleep. Consider daily probiotics. Take medications as prescribed. Labs reviewed and ordered. MGM UTD and due August 2022. Last colonoscopy 2021 due again 2024. Pap sue 2024       Relevant Orders   Hemoglobin A1c   CBC with Differential/Platelet   Comprehensive metabolic panel   TSH   Lipid panel   Osteopenia    Encouraged to get adequate exercise, calcium and vitamin d intake       Hyperglycemia    hgba1c acceptable, minimize simple carbs. Increase exercise as tolerated.        Low vitamin D level    Supplement and monitor       Relevant Orders   VITAMIN D 25 Hydroxy (Vit-D Deficiency, Fractures)   Other Visit Diagnoses     Encounter for hepatitis C screening test for low risk patient       Relevant  Orders   Hepatitis C Antibody       Follow-up: Return in about 6 months (around 12/01/2021), or 6 mn f/u and 12 month CPE.   I,David Hanna,acting as a scribe for Penni Homans, MD.,have documented all relevant documentation on the behalf of Penni Homans, MD,as directed by  Penni Homans, MD while in the presence of Penni Homans, MD. I, Mosie Lukes, MD personally performed the services described in this documentation. All medical record entries made by the scribe were at my direction and in my presence. I have reviewed the chart and  agree that the record reflects my personal performance and is accurate and complete

## 2021-06-01 NOTE — Addendum Note (Signed)
Addended by: Randolm Idol A on: 06/01/2021 09:32 AM   Modules accepted: Orders

## 2021-06-02 LAB — HEPATITIS C ANTIBODY
Hepatitis C Ab: NONREACTIVE
SIGNAL TO CUT-OFF: 0.01 (ref <1.00)

## 2021-06-06 ENCOUNTER — Encounter: Payer: Self-pay | Admitting: Family Medicine

## 2021-06-09 ENCOUNTER — Encounter: Payer: Self-pay | Admitting: Family Medicine

## 2021-06-09 ENCOUNTER — Other Ambulatory Visit (HOSPITAL_COMMUNITY): Payer: Self-pay

## 2021-06-13 ENCOUNTER — Other Ambulatory Visit (HOSPITAL_COMMUNITY): Payer: Self-pay

## 2021-06-13 MED FILL — Dupilumab Subcutaneous Soln Auto-injector 300 MG/2ML: SUBCUTANEOUS | 28 days supply | Qty: 4 | Fill #1 | Status: AC

## 2021-06-15 ENCOUNTER — Other Ambulatory Visit (HOSPITAL_COMMUNITY): Payer: Self-pay

## 2021-07-10 ENCOUNTER — Other Ambulatory Visit (HOSPITAL_COMMUNITY): Payer: Self-pay

## 2021-07-11 ENCOUNTER — Other Ambulatory Visit (HOSPITAL_COMMUNITY): Payer: Self-pay

## 2021-07-13 ENCOUNTER — Other Ambulatory Visit (HOSPITAL_COMMUNITY): Payer: Self-pay

## 2021-07-17 ENCOUNTER — Other Ambulatory Visit (HOSPITAL_COMMUNITY): Payer: Self-pay

## 2021-07-18 ENCOUNTER — Other Ambulatory Visit (HOSPITAL_COMMUNITY): Payer: Self-pay

## 2021-07-19 ENCOUNTER — Other Ambulatory Visit (HOSPITAL_COMMUNITY): Payer: Self-pay

## 2021-07-20 ENCOUNTER — Other Ambulatory Visit (HOSPITAL_COMMUNITY): Payer: Self-pay

## 2021-07-21 ENCOUNTER — Other Ambulatory Visit (HOSPITAL_COMMUNITY): Payer: Self-pay

## 2021-07-24 ENCOUNTER — Other Ambulatory Visit (HOSPITAL_COMMUNITY): Payer: Self-pay

## 2021-07-25 ENCOUNTER — Other Ambulatory Visit (HOSPITAL_COMMUNITY): Payer: Self-pay

## 2021-07-26 ENCOUNTER — Other Ambulatory Visit (HOSPITAL_COMMUNITY): Payer: Self-pay

## 2021-07-28 ENCOUNTER — Other Ambulatory Visit (HOSPITAL_COMMUNITY): Payer: Self-pay

## 2021-07-28 ENCOUNTER — Other Ambulatory Visit: Payer: Self-pay | Admitting: Pharmacist

## 2021-07-28 MED ORDER — DUPIXENT 300 MG/2ML ~~LOC~~ SOAJ: SUBCUTANEOUS | 5 refills | Status: DC

## 2021-07-28 MED ORDER — DUPIXENT 300 MG/2ML ~~LOC~~ SOAJ
SUBCUTANEOUS | 5 refills | Status: DC
  Filled 2021-07-28: qty 2, 28d supply, fill #0

## 2021-07-28 MED ORDER — DUPIXENT 300 MG/2ML ~~LOC~~ SOAJ
SUBCUTANEOUS | 5 refills | Status: DC
Start: 2021-07-28 — End: 2021-08-03
  Filled 2021-07-28: qty 4, 28d supply, fill #0

## 2021-08-02 ENCOUNTER — Other Ambulatory Visit (HOSPITAL_COMMUNITY): Payer: Self-pay

## 2021-08-02 MED ORDER — CLINDAMYCIN PHOSPHATE 1 % EX SOLN
CUTANEOUS | 5 refills | Status: AC
  Filled 2021-08-02 – 2021-08-08 (×2): qty 60, 30d supply, fill #0
  Filled 2022-07-25: qty 60, 30d supply, fill #1

## 2021-08-02 MED FILL — Venlafaxine HCl Cap ER 24HR 75 MG (Base Equivalent): ORAL | 90 days supply | Qty: 90 | Fill #1 | Status: AC

## 2021-08-03 ENCOUNTER — Other Ambulatory Visit: Payer: Self-pay | Admitting: Pharmacist

## 2021-08-03 ENCOUNTER — Other Ambulatory Visit (HOSPITAL_COMMUNITY): Payer: Self-pay

## 2021-08-03 MED ORDER — DUPIXENT 300 MG/2ML ~~LOC~~ SOAJ: SUBCUTANEOUS | 11 refills | Status: DC

## 2021-08-03 MED ORDER — DUPIXENT 300 MG/2ML ~~LOC~~ SOAJ
SUBCUTANEOUS | 11 refills | Status: DC
  Filled 2021-08-03: qty 4, fill #0
  Filled 2021-08-17: qty 4, 28d supply, fill #0
  Filled 2021-09-11: qty 4, 28d supply, fill #1
  Filled 2021-10-24 – 2021-11-06 (×3): qty 4, 28d supply, fill #2
  Filled 2021-11-28: qty 4, 28d supply, fill #3

## 2021-08-08 ENCOUNTER — Other Ambulatory Visit (HOSPITAL_COMMUNITY): Payer: Self-pay

## 2021-08-10 ENCOUNTER — Other Ambulatory Visit: Payer: Self-pay | Admitting: Family Medicine

## 2021-08-10 ENCOUNTER — Encounter: Payer: Self-pay | Admitting: Family Medicine

## 2021-08-10 DIAGNOSIS — M79671 Pain in right foot: Secondary | ICD-10-CM

## 2021-08-10 DIAGNOSIS — M79672 Pain in left foot: Secondary | ICD-10-CM

## 2021-08-10 NOTE — Telephone Encounter (Signed)
Is it okay to drop referral ?

## 2021-08-17 ENCOUNTER — Other Ambulatory Visit (HOSPITAL_COMMUNITY): Payer: Self-pay

## 2021-08-18 ENCOUNTER — Other Ambulatory Visit (HOSPITAL_COMMUNITY): Payer: Self-pay

## 2021-08-31 ENCOUNTER — Ambulatory Visit: Payer: No Typology Code available for payment source | Admitting: Podiatry

## 2021-08-31 ENCOUNTER — Ambulatory Visit (INDEPENDENT_AMBULATORY_CARE_PROVIDER_SITE_OTHER): Payer: No Typology Code available for payment source

## 2021-08-31 ENCOUNTER — Other Ambulatory Visit: Payer: Self-pay

## 2021-08-31 DIAGNOSIS — M21619 Bunion of unspecified foot: Secondary | ICD-10-CM

## 2021-08-31 DIAGNOSIS — M779 Enthesopathy, unspecified: Secondary | ICD-10-CM | POA: Diagnosis not present

## 2021-08-31 DIAGNOSIS — M79671 Pain in right foot: Secondary | ICD-10-CM | POA: Diagnosis not present

## 2021-08-31 DIAGNOSIS — M2142 Flat foot [pes planus] (acquired), left foot: Secondary | ICD-10-CM | POA: Diagnosis not present

## 2021-08-31 DIAGNOSIS — M21611 Bunion of right foot: Secondary | ICD-10-CM

## 2021-08-31 DIAGNOSIS — M2141 Flat foot [pes planus] (acquired), right foot: Secondary | ICD-10-CM

## 2021-08-31 DIAGNOSIS — M79672 Pain in left foot: Secondary | ICD-10-CM | POA: Diagnosis not present

## 2021-08-31 NOTE — Patient Instructions (Signed)

## 2021-08-31 NOTE — Progress Notes (Unsigned)
l °

## 2021-09-05 ENCOUNTER — Encounter: Payer: Self-pay | Admitting: Family Medicine

## 2021-09-05 NOTE — Progress Notes (Signed)
Subjective:   Patient ID: Tricia Potts, female   DOB: 50 y.o.   MRN: [id number]   HPI 50 year old female presents the office today for concerns of bilateral foot pain.  She states that she gets most of her pain on the lateral side of her foot and describes a sharp pain intermittently.  She also has bunions in both of her feet which do cause tenderness.  She has not had any recent injury or trauma.  She has tried new shoes including Hoka and oofos some slight improvement.  She states that she is unable to wear other shoes given her foot discomfort.  She does bring up the Lapiplasty bunionectomy.    Review of Systems  All other systems reviewed and are negative.  Past Medical History:  Diagnosis Date   Allergy    Anemia 06/18/2014   Anxiety    Anxiety and depression 02/17/2009   Qualifier: Diagnosis of  By: Redmond Pulling MD, LauraLee     Asthma    Broken ankle 2011   (left) roller skating   Bronchitis, mucopurulent recurrent ([place]) 04/21/2013   Cervical cancer screening 02/20/2016   Depression    GERD (gastroesophageal reflux disease)    Hemorrhoid    History of hidradenitis suppurativa    Hyperlipemia    IBS (irritable bowel syndrome)    Kidney infection    as a child   Low back pain 05/25/2016   Osteopenia    PCO (polycystic ovaries)    Pruritus 08/22/2017    Past Surgical History:  Procedure Laterality Date   ANKLE SURGERY     plate and 8 screws in left ankle   HEMORRHOID SURGERY       Current Outpatient Medications:    albuterol (VENTOLIN HFA) 108 (90 Base) MCG/ACT inhaler, Inhale 2 puffs into the lungs every 6 (six) hours as needed for wheezing or shortness of breath., Disp: 18 g, Rfl: 2   ALPRAZolam (XANAX) 0.25 MG tablet, TAKE 1 TABLET BY MOUTH 2 TIMES DAILY AS NEEDED FOR ANXIETY, Disp: 40 tablet, Rfl: 1   atorvastatin (LIPITOR) 10 MG tablet, TAKE 1 TABLET BY MOUTH ONCE DAILY, Disp: 90 tablet, Rfl: 3   CALCIUM CITRATE PO, Take 2 tablets by mouth daily., Disp: , Rfl:     Cholecalciferol (VITAMIN D) 50 MCG (2000 UT) CAPS, Take by mouth., Disp: , Rfl:    clindamycin (CLEOCIN T) 1 % external solution, Apply a small amount to skin 2-3 times a week for acne, Disp: 60 mL, Rfl: 3   clindamycin (CLEOCIN T) 1 % external solution, Apply a small amount to skin 2 - 3 times a week for acne, Disp: 60 mL, Rfl: 5   COVID-19 At Home Antigen Test KIT, USE AS DIRECTED WITHIN PACKAGE INSTRUCTIONS, Disp: 4 kit, Rfl: 0   Crisaborole 2 % OINT, APPLY TO FINGERTIPS 2 TIMES DAILY, Disp: 60 g, Rfl: 1   Dermatological Products, Misc. [facility]) lotion, Apply topically 2 (two) times daily., Disp: , Rfl:    Dupilumab (DUPIXENT) 300 MG/2ML SOPN, inject one pen subcutaneously every other week, Disp: 4 mL, Rfl: 11   famotidine (PEPCID) 40 MG tablet, TAKE 1 TABLET BY MOUTH AT BEDTIME, Disp: 90 tablet, Rfl: 1   fexofenadine (ALLEGRA) 180 MG tablet, TAKE 1 TABLET BY MOUTH DAILY., Disp: 90 tablet, Rfl: 1   fluticasone (FLONASE) 50 MCG/ACT nasal spray, PLACE 2 SPRAYS INTO THE NOSE DAILY, Disp: 48 g, Rfl: 1   fluticasone-salmeterol (ADVAIR DISKUS) 250-50 MCG/ACT AEPB, Inhale 1 puff into  the lungs 2 (two) times daily as needed., Disp: 60 each, Rfl: 2   hydrocortisone cream 1 %, Apply 1 application topically as needed for itching., Disp: , Rfl:    ibuprofen (ADVIL,MOTRIN) 200 MG tablet, Take 400-800 mg by mouth every 6 (six) hours as needed for headache or mild pain., Disp: , Rfl:    montelukast (SINGULAIR) 10 MG tablet, Take 1 tablet (10 mg total) by mouth at bedtime as needed., Disp: 90 tablet, Rfl: 3   Multiple Vitamin (MULTI-VITAMIN DAILY PO), Take by mouth daily., Disp: , Rfl:    olopatadine (PATANOL) 0.1 % ophthalmic solution, PLACE 1 DROP INTO BOTH EYES 2 TIMES DAILY., Disp: 5 mL, Rfl: 12   OVER THE COUNTER MEDICATION, Place 1 drop into both eyes as needed (allergy symptoms). OTC eye drops  Systane/Artificial Tears/Homeopathic eye drops, Disp: , Rfl:    permethrin (ELIMITE) 5 % cream, APPLY ONCE FOR  1 DOSE AS DIRECTED, Disp: 60 g, Rfl: 1   triamcinolone cream (KENALOG) 0.1 %, Apply 1 application topically 2 (two) times daily., Disp: 80 g, Rfl: 1   venlafaxine XR (EFFEXOR-XR) 75 MG 24 hr capsule, TAKE 1 CAPSULE BY MOUTH DAILY WITH BREAKFAST, Disp: 90 capsule, Rfl: 1  No Known Allergies       Objective:  Physical Exam  General: AAO x3, NAD  Dermatological: Skin is warm, dry and supple bilateral.  No open lesions noted.  Vascular: Dorsalis Pedis artery and Posterior Tibial artery pedal pulses are 2/4 bilateral with immedate capillary fill time.  There is no pain with calf compression, swelling, warmth, erythema.   Neruologic: Grossly intact via light touch bilateral.   Musculoskeletal: There is a decreased medial arch upon weightbearing.  Majority of tenderness she reports is to the lateral aspect of foot along the course of the peroneal tendon, able to identify any tenderness to the area today.  She has moderate bunions and there is mild erythema medial first metatarsal head from irritation side shoe gear.  There is no pain or crepitation present.  Range of motion. Muscular strength 5/5 in all groups tested bilateral.  Gait: Unassisted, Nonantalgic.       Assessment:   Bilateral foot pain, likely tendinitis given biomechanical changes; symptomatic bunion     Plan:  -Treatment options discussed including all alternatives, risks, and complications -Etiology of symptoms were discussed -X-rays were obtained and reviewed with the patient.  Significant bunions present bilaterally.  No evidence of acute fracture. -Regards to her tendinitis, foot pain we discussed orthotics to help with the flatfoot outside of just changing the shoes.  Discussed anti-inflammatories that she can use as well as well as stretching, rehab exercises. -Regards the bunion we discussed with conservative as well as surgical treatment options.  Conservatively discussion modifications, offloading padding.  We did  discuss the Lapidus bunionectomy including the surgical postoperative course.  If the bunion is causing issues I do think it be reasonable to fix the bunion.  She does not consider her options.  Trula Slade DPM

## 2021-09-06 ENCOUNTER — Other Ambulatory Visit (HOSPITAL_COMMUNITY): Payer: Self-pay

## 2021-09-06 ENCOUNTER — Other Ambulatory Visit: Payer: Self-pay | Admitting: Family Medicine

## 2021-09-06 MED ORDER — ALPRAZOLAM 0.25 MG PO TABS
0.2500 mg | ORAL_TABLET | Freq: Two times a day (BID) | ORAL | 1 refills | Status: DC | PRN
  Filled 2021-09-06: qty 40, 20d supply, fill #0

## 2021-09-06 NOTE — Telephone Encounter (Signed)
Spoke with pt and will be in 12/20

## 2021-09-11 ENCOUNTER — Other Ambulatory Visit (HOSPITAL_COMMUNITY): Payer: Self-pay

## 2021-09-13 ENCOUNTER — Other Ambulatory Visit (HOSPITAL_COMMUNITY): Payer: Self-pay

## 2021-10-09 ENCOUNTER — Other Ambulatory Visit (HOSPITAL_COMMUNITY): Payer: Self-pay

## 2021-10-09 MED ORDER — NYSTATIN 100000 UNIT/GM EX OINT
TOPICAL_OINTMENT | CUTANEOUS | 0 refills | Status: DC
  Filled 2021-10-09: qty 30, 14d supply, fill #0

## 2021-10-10 ENCOUNTER — Other Ambulatory Visit (HOSPITAL_COMMUNITY): Payer: Self-pay

## 2021-10-11 ENCOUNTER — Other Ambulatory Visit (HOSPITAL_COMMUNITY): Payer: Self-pay

## 2021-10-13 ENCOUNTER — Other Ambulatory Visit (HOSPITAL_COMMUNITY): Payer: Self-pay

## 2021-10-16 ENCOUNTER — Other Ambulatory Visit (HOSPITAL_COMMUNITY): Payer: Self-pay

## 2021-10-17 ENCOUNTER — Other Ambulatory Visit (HOSPITAL_COMMUNITY): Payer: Self-pay

## 2021-10-18 ENCOUNTER — Other Ambulatory Visit (HOSPITAL_COMMUNITY): Payer: Self-pay

## 2021-10-20 ENCOUNTER — Other Ambulatory Visit (HOSPITAL_COMMUNITY): Payer: Self-pay

## 2021-10-20 ENCOUNTER — Other Ambulatory Visit: Payer: Self-pay | Admitting: Family Medicine

## 2021-10-20 MED ORDER — FAMOTIDINE 40 MG PO TABS
ORAL_TABLET | Freq: Every day | ORAL | 1 refills | Status: DC
  Filled 2021-10-20: qty 90, 90d supply, fill #0
  Filled 2022-01-30: qty 90, 90d supply, fill #1

## 2021-10-24 ENCOUNTER — Other Ambulatory Visit (HOSPITAL_COMMUNITY): Payer: Self-pay

## 2021-10-25 ENCOUNTER — Other Ambulatory Visit (HOSPITAL_COMMUNITY): Payer: Self-pay

## 2021-10-27 ENCOUNTER — Other Ambulatory Visit (HOSPITAL_BASED_OUTPATIENT_CLINIC_OR_DEPARTMENT_OTHER): Payer: Self-pay

## 2021-10-27 ENCOUNTER — Other Ambulatory Visit (HOSPITAL_COMMUNITY): Payer: Self-pay

## 2021-10-27 MED ORDER — DUPIXENT 300 MG/2ML ~~LOC~~ SOAJ
SUBCUTANEOUS | 1 refills | Status: DC
  Filled 2021-10-27: qty 2, 28d supply, fill #0
  Filled 2022-01-05: qty 4, 28d supply, fill #0

## 2021-11-06 ENCOUNTER — Other Ambulatory Visit (HOSPITAL_COMMUNITY): Payer: Self-pay

## 2021-11-08 ENCOUNTER — Other Ambulatory Visit: Payer: Self-pay | Admitting: Family Medicine

## 2021-11-08 ENCOUNTER — Other Ambulatory Visit (HOSPITAL_COMMUNITY): Payer: Self-pay

## 2021-11-08 MED ORDER — VENLAFAXINE HCL ER 75 MG PO CP24
ORAL_CAPSULE | Freq: Every day | ORAL | 1 refills | Status: DC
  Filled 2021-11-08: qty 90, 90d supply, fill #0
  Filled 2022-02-06: qty 90, 90d supply, fill #1

## 2021-11-10 ENCOUNTER — Other Ambulatory Visit (HOSPITAL_COMMUNITY): Payer: Self-pay

## 2021-11-13 ENCOUNTER — Other Ambulatory Visit (HOSPITAL_COMMUNITY): Payer: Self-pay

## 2021-11-28 ENCOUNTER — Other Ambulatory Visit (HOSPITAL_COMMUNITY): Payer: Self-pay

## 2021-12-12 ENCOUNTER — Other Ambulatory Visit (HOSPITAL_BASED_OUTPATIENT_CLINIC_OR_DEPARTMENT_OTHER): Payer: Self-pay

## 2021-12-12 ENCOUNTER — Ambulatory Visit: Payer: No Typology Code available for payment source | Admitting: Family Medicine

## 2021-12-14 ENCOUNTER — Other Ambulatory Visit (HOSPITAL_COMMUNITY): Payer: Self-pay

## 2021-12-28 ENCOUNTER — Ambulatory Visit (INDEPENDENT_AMBULATORY_CARE_PROVIDER_SITE_OTHER): Payer: No Typology Code available for payment source | Admitting: Family Medicine

## 2021-12-28 ENCOUNTER — Encounter: Payer: Self-pay | Admitting: Family Medicine

## 2021-12-28 VITALS — BP 119/76 | HR 65 | Temp 97.9°F | Resp 12 | Ht 60.0 in | Wt 161.6 lb

## 2021-12-28 DIAGNOSIS — E782 Mixed hyperlipidemia: Secondary | ICD-10-CM | POA: Diagnosis not present

## 2021-12-28 DIAGNOSIS — K580 Irritable bowel syndrome with diarrhea: Secondary | ICD-10-CM

## 2021-12-28 DIAGNOSIS — R7989 Other specified abnormal findings of blood chemistry: Secondary | ICD-10-CM | POA: Diagnosis not present

## 2021-12-28 DIAGNOSIS — F419 Anxiety disorder, unspecified: Secondary | ICD-10-CM

## 2021-12-28 DIAGNOSIS — Z1239 Encounter for other screening for malignant neoplasm of breast: Secondary | ICD-10-CM

## 2021-12-28 DIAGNOSIS — D649 Anemia, unspecified: Secondary | ICD-10-CM

## 2021-12-28 DIAGNOSIS — R739 Hyperglycemia, unspecified: Secondary | ICD-10-CM

## 2021-12-28 DIAGNOSIS — F32A Depression, unspecified: Secondary | ICD-10-CM

## 2021-12-28 NOTE — Assessment & Plan Note (Signed)
hgba1c acceptable, minimize simple carbs. Increase exercise as tolerated.  

## 2021-12-28 NOTE — Patient Instructions (Signed)
[employer] shoes  High Cholesterol High cholesterol is a condition in which the blood has high levels of a white, waxy substance similar to fat (cholesterol). The liver makes all the cholesterol that the body needs. The human body needs small amounts of cholesterol to help build cells. A person gets extra or excess cholesterol from the food that he or she eats. The blood carries cholesterol from the liver to the rest of the body. If you have high cholesterol, deposits (plaques) may build up on the walls of your arteries. Arteries are the blood vessels that carry blood away from your heart. These plaques make the arteries narrow and stiff. Cholesterol plaques increase your risk for heart attack and stroke. Work with your health care provider to keep your cholesterol levels in a healthy range. What increases the risk? The following factors may make you more likely to develop this condition: Eating foods that are high in animal fat (saturated fat) or cholesterol. Being overweight. Not getting enough exercise. A family history of high cholesterol (familial hypercholesterolemia). Use of tobacco products. Having diabetes. What are the signs or symptoms? In most cases, high cholesterol does not usually cause any symptoms. In severe cases, very high cholesterol levels can cause: Fatty bumps under the skin (xanthomas). A white or gray ring around the black center (pupil) of the eye. How is this diagnosed? This condition may be diagnosed based on the results of a blood test. If you are older than 53 years of age, your health care provider may check your cholesterol levels every 4-6 years. You may be checked more often if you have high cholesterol or other risk factors for heart disease. The blood test for cholesterol measures: "Bad" cholesterol, or LDL cholesterol. This is the main type of cholesterol that causes heart disease. The desired level is less than 100 mg/dL (2.59 mmol/L). "Good" cholesterol,  or HDL cholesterol. HDL helps protect against heart disease by cleaning the arteries and carrying the LDL to the liver for processing. The desired level for HDL is 60 mg/dL (1.55 mmol/L) or higher. Triglycerides. These are fats that your body can store or burn for energy. The desired level is less than 150 mg/dL (1.69 mmol/L). Total cholesterol. This measures the total amount of cholesterol in your blood and includes LDL, HDL, and triglycerides. The desired level is less than 200 mg/dL (5.17 mmol/L). How is this treated? Treatment for high cholesterol starts with lifestyle changes, such as diet and exercise. Diet changes. You may be asked to eat foods that have more fiber and less saturated fats or added sugar. Lifestyle changes. These may include regular exercise, maintaining a healthy weight, and quitting use of tobacco products. Medicines. These are given when diet and lifestyle changes have not worked. You may be prescribed a statin medicine to help lower your cholesterol levels. Follow these instructions at home: Eating and drinking  Eat a healthy, balanced diet. This diet includes: Daily servings of a variety of fresh, frozen, or canned fruits and vegetables. Daily servings of whole grain foods that are rich in fiber. Foods that are low in saturated fats and trans fats. These include poultry and fish without skin, lean cuts of meat, and low-fat dairy products. A variety of fish, especially oily fish that contain omega-3 fatty acids. Aim to eat fish at least 2 times a week. Avoid foods and drinks that have added sugar. Use healthy cooking methods, such as roasting, grilling, broiling, baking, poaching, steaming, and stir-frying. Do not fry your food  except for stir-frying. If you drink alcohol: Limit how much you have to: 0-1 drink a day for women who are not pregnant. 0-2 drinks a day for men. Know how much alcohol is in a drink. In the [place] one drink equals one 12 oz bottle of beer (355  mL), one 5 oz glass of wine (148 mL), or one 1 oz glass of hard liquor (44 mL). Lifestyle  Get regular exercise. Aim to exercise for a total of 150 minutes a week. Increase your activity level by doing activities such as gardening, walking, and taking the stairs. Do not use any products that contain nicotine or tobacco. These products include cigarettes, chewing tobacco, and vaping devices, such as e-cigarettes. If you need help quitting, ask your health care provider. General instructions Take over-the-counter and prescription medicines only as told by your health care provider. Keep all follow-up visits. This is important. Where to find more information American Heart Association: www.heart.org National Heart, Lung, and Blood Institute: https://wilson-eaton.com/ Contact a health care provider if: You have trouble achieving or maintaining a healthy diet or weight. You are starting an exercise program. You are unable to stop smoking. Get help right away if: You have chest pain. You have trouble breathing. You have discomfort or pain in your jaw, neck, back, shoulder, or arm. You have any symptoms of a stroke. "BE FAST" is an easy way to remember the main warning signs of a stroke: B - Balance. Signs are dizziness, sudden trouble walking, or loss of balance. E - Eyes. Signs are trouble seeing or a sudden change in vision. F - Face. Signs are sudden weakness or numbness of the face, or the face or eyelid drooping on one side. A - Arms. Signs are weakness or numbness in an arm. This happens suddenly and usually on one side of the body. S - Speech. Signs are sudden trouble speaking, slurred speech, or trouble understanding what people say. T - Time. Time to call emergency services. Write down what time symptoms started. You have other signs of a stroke, such as: A sudden, severe headache with no known cause. Nausea or vomiting. Seizure. These symptoms may represent a serious problem that is an  emergency. Do not wait to see if the symptoms will go away. Get medical help right away. Call your local emergency services (911 in the [place] Do not drive yourself to the hospital. Summary Cholesterol plaques increase your risk for heart attack and stroke. Work with your health care provider to keep your cholesterol levels in a healthy range. Eat a healthy, balanced diet, get regular exercise, and maintain a healthy weight. Do not use any products that contain nicotine or tobacco. These products include cigarettes, chewing tobacco, and vaping devices, such as e-cigarettes. Get help right away if you have any symptoms of a stroke. This information is not intended to replace advice given to you by your health care provider. Make sure you discuss any questions you have with your health care provider. Document Revised: 02/23/2021 Document Reviewed: 02/13/2021 Elsevier Patient Education  2022 [employer].

## 2021-12-28 NOTE — Assessment & Plan Note (Addendum)
Encourage heart healthy diet such as MIND or DASH diet, increase exercise, avoid trans fats, simple carbohydrates and processed foods, consider a krill or fish or flaxseed oil cap daily. Tolerating Atorvastatin 

## 2021-12-28 NOTE — Assessment & Plan Note (Signed)
She continues to work FT with [employer] and juggle her family life and overall feels she is managing it well. Her greatest struggle continues to be her husband's addiction. She uses her Alprazolam infrequently and it works well when she needs it. No changes today

## 2021-12-28 NOTE — Progress Notes (Signed)
Subjective:    Patient ID: Tricia Potts, female    DOB: [birth year], 51 y.o.   MRN: [id number]  Chief Complaint  Patient presents with   Follow-up    HPI Patient is in today for follow up on chronic medical concerns. No recent febrile illness or hospitalizations. No acute concerns. Her greatest health concern continues to be stress both work and family but overall she feels she is managing it well. She is on day five of trying to eat better in the vein of DASH/MIND diet and she plans to continue. Denies CP/palp/SOB/HA/congestion/fevers/GI or GU c/o. Taking meds as prescribed   Past Medical History:  Diagnosis Date   Allergy    Anemia 06/18/2014   Anxiety    Anxiety and depression 02/17/2009   Qualifier: Diagnosis of  By: Redmond Pulling MD, LauraLee     Asthma    Broken ankle 2011   (left) roller skating   Bronchitis, mucopurulent recurrent ([place]) 04/21/2013   Cervical cancer screening 02/20/2016   Depression    GERD (gastroesophageal reflux disease)    Hemorrhoid    History of hidradenitis suppurativa    Hyperlipemia    IBS (irritable bowel syndrome)    Kidney infection    as a child   Low back pain 05/25/2016   Osteopenia    PCO (polycystic ovaries)    Pruritus 08/22/2017    Past Surgical History:  Procedure Laterality Date   ANKLE SURGERY     plate and 8 screws in left ankle   HEMORRHOID SURGERY      Family History  Problem Relation Age of Onset   Allergies Mother    COPD Mother    Irritable bowel syndrome Mother    Heart disease Father        mitral valve disease/rupture during physical stress   GER disease Father    Irritable bowel syndrome Father    Heart failure Father    Hepatitis C Brother    Cancer Brother 94       ALL   Heart disease Daughter        asd s/p repair at age 31   Anxiety disorder Daughter    Cancer Maternal Grandmother        ovarian cancer   Heart disease Maternal Grandfather        MI at 67   Kidney disease Paternal Grandfather         possible kidney cancer   Allergies Sister    Eczema Sister    Cancer Sister 23       breast, DCIS   Allergies Daughter    Kidney disease Paternal Grandmother    Ovarian cancer Other        Grandmother   Arthritis Other    Irritable bowel syndrome Niece    Colon cancer Neg Hx    Rectal cancer Neg Hx    Esophageal cancer Neg Hx    Stomach cancer Neg Hx     Social History   Socioeconomic History   Marital status: Married    Spouse name: Not on file   Number of children: 3   Years of education: Not on file   Highest education level: Not on file  Occupational History   Occupation: Programmer, multimedia: [employer]  Tobacco Use   Smoking status: Never   Smokeless tobacco: Never  Vaping Use   Vaping Use: Never used  Substance and Sexual Activity   Alcohol use: No   Drug  use: No   Sexual activity: Yes    Partners: Male    Comment: perimenopausal  Other Topics Concern   Not on file  Social History Narrative   Works in interventional radiology-RN [employer]   Married   3 daughters   Social Determinants of Health   Financial Resource Strain: Not on file  Food Insecurity: Not on file  Transportation Needs: Not on file  Physical Activity: Not on file  Stress: Not on file  Social Connections: Not on file  Intimate Partner Violence: Not on file    Outpatient Medications Prior to Visit  Medication Sig Dispense Refill   albuterol (VENTOLIN HFA) 108 (90 Base) MCG/ACT inhaler Inhale 2 puffs into the lungs every 6 (six) hours as needed for wheezing or shortness of breath. 18 g 2   ALPRAZolam (XANAX) 0.25 MG tablet Take 1 tablet by mouth 2 (two) times daily as needed for anxiety. 40 tablet 1   atorvastatin (LIPITOR) 10 MG tablet TAKE 1 TABLET BY MOUTH ONCE DAILY 90 tablet 3   CALCIUM CITRATE PO Take 2 tablets by mouth daily.     Cholecalciferol (VITAMIN D) 50 MCG (2000 UT) CAPS Take by mouth.     clindamycin (CLEOCIN T) 1 % external solution Apply a small amount to skin 2 - 3 times a  week for acne 60 mL 5   Crisaborole 2 % OINT APPLY TO FINGERTIPS 2 TIMES DAILY 60 g 1   Dermatological Products, Misc. [facility]) lotion Apply topically 2 (two) times daily.     Dupilumab (DUPIXENT) 300 MG/2ML SOPN inject One pen SubQ every other week 2 mL 1   famotidine (PEPCID) 40 MG tablet TAKE 1 TABLET BY MOUTH AT BEDTIME 90 tablet 1   fluticasone (FLONASE) 50 MCG/ACT nasal spray PLACE 2 SPRAYS INTO THE NOSE DAILY 48 g 1   fluticasone-salmeterol (ADVAIR DISKUS) 250-50 MCG/ACT AEPB Inhale 1 puff into the lungs 2 (two) times daily as needed. 60 each 2   hydrocortisone cream 1 % Apply 1 application topically as needed for itching.     ibuprofen (ADVIL,MOTRIN) 200 MG tablet Take 400-800 mg by mouth every 6 (six) hours as needed for headache or mild pain.     montelukast (SINGULAIR) 10 MG tablet Take 1 tablet (10 mg total) by mouth at bedtime as needed. 90 tablet 3   Multiple Vitamin (MULTI-VITAMIN DAILY PO) Take by mouth daily.     nystatin ointment (MYCOSTATIN) Apply topically to affected areas 3 times a day for 14 consecutive days 30 g 0   olopatadine (PATANOL) 0.1 % ophthalmic solution PLACE 1 DROP INTO BOTH EYES 2 TIMES DAILY. 5 mL 12   OVER THE COUNTER MEDICATION Place 1 drop into both eyes as needed (allergy symptoms). OTC eye drops  Systane/Artificial Tears/Homeopathic eye drops     triamcinolone cream (KENALOG) 0.1 % Apply 1 application topically 2 (two) times daily. 80 g 1   venlafaxine XR (EFFEXOR-XR) 75 MG 24 hr capsule TAKE 1 CAPSULE BY MOUTH DAILY WITH BREAKFAST 90 capsule 1   fexofenadine (ALLEGRA) 180 MG tablet TAKE 1 TABLET BY MOUTH DAILY. 90 tablet 1   clindamycin (CLEOCIN T) 1 % external solution Apply a small amount to skin 2-3 times a week for acne 60 mL 3   COVID-19 At Home Antigen Test KIT USE AS DIRECTED WITHIN PACKAGE INSTRUCTIONS 4 kit 0   Dupilumab (DUPIXENT) 300 MG/2ML SOPN inject one pen subcutaneously every other week 4 mL 11   No facility-administered medications  prior to visit.    No Known Allergies  Review of Systems  Constitutional:  Negative for fever and malaise/fatigue.  HENT:  Negative for congestion.   Eyes:  Negative for blurred vision.  Respiratory:  Negative for shortness of breath.   Cardiovascular:  Negative for chest pain, palpitations and leg swelling.  Gastrointestinal:  Negative for abdominal pain, blood in stool and nausea.  Genitourinary:  Negative for dysuria and frequency.  Musculoskeletal:  Negative for falls.  Skin:  Negative for rash.  Neurological:  Negative for dizziness, loss of consciousness and headaches.  Endo/Heme/Allergies:  Negative for environmental allergies.  Psychiatric/Behavioral:  Negative for depression. The patient is nervous/anxious.       Objective:    Physical Exam Constitutional:      General: She is not in acute distress.    Appearance: She is well-developed.  HENT:     Head: Normocephalic and atraumatic.  Eyes:     Conjunctiva/sclera: Conjunctivae normal.  Neck:     Thyroid: No thyromegaly.  Cardiovascular:     Rate and Rhythm: Normal rate and regular rhythm.     Heart sounds: Normal heart sounds. No murmur heard. Pulmonary:     Effort: Pulmonary effort is normal. No respiratory distress.     Breath sounds: Normal breath sounds.  Abdominal:     General: Bowel sounds are normal. There is no distension.     Palpations: Abdomen is soft. There is no mass.     Tenderness: There is no abdominal tenderness.  Musculoskeletal:     Cervical back: Neck supple.  Lymphadenopathy:     Cervical: No cervical adenopathy.  Skin:    General: Skin is warm and dry.  Neurological:     Mental Status: She is alert and oriented to person, place, and time.  Psychiatric:        Behavior: Behavior normal.    BP 119/76 (BP Location: Right Arm, Cuff Size: Normal)    Pulse 65    Temp 97.9 F (36.6 C) (Oral)    Resp 12    Ht 5' (1.524 m)    Wt 161 lb 9.6 oz (73.3 kg)    SpO2 98%    BMI 31.56 kg/m  Wt  Readings from Last 3 Encounters:  12/28/21 161 lb 9.6 oz (73.3 kg)  06/01/21 160 lb 6.4 oz (72.8 kg)  06/17/20 161 lb (73 kg)    Diabetic Foot Exam - Simple   No data filed    Lab Results  Component Value Date   WBC 4.0 06/01/2021   HGB 14.0 06/01/2021   HCT 41.3 06/01/2021   PLT 289.0 06/01/2021   GLUCOSE 89 06/01/2021   CHOL 198 06/01/2021   TRIG 259.0 (H) 06/01/2021   HDL 52.30 06/01/2021   LDLDIRECT 112.0 06/01/2021   LDLCALC 154 (H) 09/19/2018   ALT 21 06/01/2021   AST 21 06/01/2021   NA 140 06/01/2021   K 4.4 06/01/2021   CL 105 06/01/2021   CREATININE 0.69 06/01/2021   BUN 12 06/01/2021   CO2 27 06/01/2021   TSH 3.40 06/01/2021   HGBA1C 5.6 06/01/2021    Lab Results  Component Value Date   TSH 3.40 06/01/2021   Lab Results  Component Value Date   WBC 4.0 06/01/2021   HGB 14.0 06/01/2021   HCT 41.3 06/01/2021   MCV 91.9 06/01/2021   PLT 289.0 06/01/2021   Lab Results  Component Value Date   NA 140 06/01/2021   K 4.4 06/01/2021  CO2 27 06/01/2021   GLUCOSE 89 06/01/2021   BUN 12 06/01/2021   CREATININE 0.69 06/01/2021   BILITOT 0.5 06/01/2021   ALKPHOS 66 06/01/2021   AST 21 06/01/2021   ALT 21 06/01/2021   PROT 7.5 06/01/2021   ALBUMIN 4.5 06/01/2021   CALCIUM 9.5 06/01/2021   GFR 99.89 06/01/2021   Lab Results  Component Value Date   CHOL 198 06/01/2021   Lab Results  Component Value Date   HDL 52.30 06/01/2021   Lab Results  Component Value Date   LDLCALC 154 (H) 09/19/2018   Lab Results  Component Value Date   TRIG 259.0 (H) 06/01/2021   Lab Results  Component Value Date   CHOLHDL 4 06/01/2021   Lab Results  Component Value Date   HGBA1C 5.6 06/01/2021       Assessment & Plan:   Problem List Items Addressed This Visit     Hyperlipidemia, mixed    Encourage heart healthy diet such as MIND or DASH diet, increase exercise, avoid trans fats, simple carbohydrates and processed foods, consider a krill or fish or  flaxseed oil cap daily. Tolerating Atorvastatin      Relevant Orders   Comprehensive metabolic panel   Lipid panel   TSH   Anxiety and depression    She continues to work FT with [employer] and juggle her family life and overall feels she is managing it well. Her greatest struggle continues to be her husband's addiction. She uses her Alprazolam infrequently and it works well when she needs it. No changes today      IRRITABLE BOWEL SYNDROME    Avoid offending foods, take probiotics. Do not eat large meals in late evening and consider raising head of bed.       Anemia   Relevant Orders   CBC   Hyperglycemia    hgba1c acceptable, minimize simple carbs. Increase exercise as tolerated.       Relevant Orders   Hemoglobin A1c   TSH   Low vitamin D level    Supplement and monitor      Relevant Orders   VITAMIN D 25 Hydroxy (Vit-D Deficiency, Fractures)   Other Visit Diagnoses     Encounter for screening for malignant neoplasm of breast, unspecified screening modality    -  Primary   Relevant Orders   MM 3D SCREEN BREAST BILATERAL       I have discontinued Katharine Look [facility]. Fickle's COVID-19 At Home Antigen Test. I am also having her maintain her OVER THE COUNTER MEDICATION, Multiple Vitamin (MULTI-VITAMIN DAILY PO), CALCIUM CITRATE PO, ibuprofen, hydrocortisone cream, montelukast, triamcinolone cream, Vitamin D, olopatadine, Crisaborole, fexofenadine, atorvastatin, fluticasone, albuterol, fluticasone-salmeterol, clindamycin, EpiCeram, ALPRAZolam, nystatin ointment, famotidine, Dupixent, and venlafaxine XR.  No orders of the defined types were placed in this encounter.    Penni Homans, MD

## 2021-12-28 NOTE — Assessment & Plan Note (Signed)
Avoid offending foods, take probiotics. Do not eat large meals in late evening and consider raising head of bed.  

## 2021-12-28 NOTE — Assessment & Plan Note (Signed)
Supplement and monitor 

## 2022-01-05 ENCOUNTER — Other Ambulatory Visit (HOSPITAL_COMMUNITY): Payer: Self-pay

## 2022-01-06 ENCOUNTER — Telehealth (HOSPITAL_BASED_OUTPATIENT_CLINIC_OR_DEPARTMENT_OTHER): Payer: Self-pay

## 2022-01-07 ENCOUNTER — Other Ambulatory Visit: Payer: Self-pay | Admitting: Family Medicine

## 2022-01-08 ENCOUNTER — Other Ambulatory Visit (HOSPITAL_COMMUNITY): Payer: Self-pay

## 2022-01-08 MED ORDER — FEXOFENADINE HCL 180 MG PO TABS
ORAL_TABLET | Freq: Every day | ORAL | 1 refills | Status: DC
  Filled 2022-01-08: qty 90, 90d supply, fill #0
  Filled 2022-02-18: qty 90, 90d supply, fill #1

## 2022-01-09 ENCOUNTER — Other Ambulatory Visit (HOSPITAL_COMMUNITY): Payer: Self-pay

## 2022-01-30 ENCOUNTER — Other Ambulatory Visit (HOSPITAL_COMMUNITY): Payer: Self-pay

## 2022-02-05 ENCOUNTER — Other Ambulatory Visit: Payer: Self-pay | Admitting: Pharmacist

## 2022-02-05 ENCOUNTER — Other Ambulatory Visit (HOSPITAL_COMMUNITY): Payer: Self-pay

## 2022-02-05 MED ORDER — DUPIXENT 300 MG/2ML ~~LOC~~ SOAJ
SUBCUTANEOUS | 1 refills | Status: DC
  Filled 2022-02-05: qty 4, 28d supply, fill #0
  Filled 2022-03-01: qty 4, 28d supply, fill #1

## 2022-02-06 ENCOUNTER — Other Ambulatory Visit (HOSPITAL_COMMUNITY): Payer: Self-pay

## 2022-02-08 ENCOUNTER — Other Ambulatory Visit (HOSPITAL_COMMUNITY): Payer: Self-pay

## 2022-02-12 ENCOUNTER — Other Ambulatory Visit (HOSPITAL_BASED_OUTPATIENT_CLINIC_OR_DEPARTMENT_OTHER): Payer: Self-pay

## 2022-02-18 ENCOUNTER — Encounter: Payer: Self-pay | Admitting: Family Medicine

## 2022-02-19 ENCOUNTER — Encounter: Payer: Self-pay | Admitting: Family Medicine

## 2022-02-19 ENCOUNTER — Other Ambulatory Visit: Payer: Self-pay

## 2022-02-19 ENCOUNTER — Other Ambulatory Visit (HOSPITAL_COMMUNITY): Payer: Self-pay

## 2022-02-19 ENCOUNTER — Ambulatory Visit: Payer: No Typology Code available for payment source | Attending: Family Medicine | Admitting: Pharmacist

## 2022-02-19 DIAGNOSIS — J302 Other seasonal allergic rhinitis: Secondary | ICD-10-CM

## 2022-02-19 DIAGNOSIS — Z79899 Other long term (current) drug therapy: Secondary | ICD-10-CM

## 2022-02-19 MED ORDER — MONTELUKAST SODIUM 10 MG PO TABS
10.0000 mg | ORAL_TABLET | Freq: Every evening | ORAL | 3 refills | Status: DC | PRN
  Filled 2022-02-19: qty 90, 90d supply, fill #0
  Filled 2022-12-02: qty 90, 90d supply, fill #1
  Filled 2022-12-20: qty 90, 90d supply, fill #2

## 2022-02-19 NOTE — Progress Notes (Signed)
° °  S: Patient presents for review of their specialty medication therapy.  Patient is currently taking Dupixent for atopic dermatitis. Patient is managed by Dr. Rosette Reveal for this.   Adherence: confirms.   Efficacy: reports that while not 100%, the medication works fairly well for her.    Dosing: 300 mg subq Every 14 days.   Dose adjustments: Renal: no dose adjustments (has not been studied) Hepatic: no dose adjustments (has not been studied)  Drug-drug interactions: none identified   Screening: TB test: completed   Monitoring: S/sx of infection: none  S/sx of hypersensitivity: none  S/sx of ocular effects: none  S/sx of eosinophilia/vasculitis: none   O:     Lab Results  Component Value Date   WBC 4.0 06/01/2021   HGB 14.0 06/01/2021   HCT 41.3 06/01/2021   MCV 91.9 06/01/2021   PLT 289.0 06/01/2021      Chemistry      Component Value Date/Time   NA 140 06/01/2021 0903   K 4.4 06/01/2021 0903   CL 105 06/01/2021 0903   CO2 27 06/01/2021 0903   BUN 12 06/01/2021 0903   CREATININE 0.69 06/01/2021 0903   CREATININE 0.63 05/31/2014 0913      Component Value Date/Time   CALCIUM 9.5 06/01/2021 0903   CALCIUM 9.7 04/07/2010 0000   ALKPHOS 66 06/01/2021 0903   AST 21 06/01/2021 0903   ALT 21 06/01/2021 0903   BILITOT 0.5 06/01/2021 0903       A/P: 1. Medication review: Patient currently on [place] for atopic dermatitis. Reviewed the medication with the patient, including the following: Dupixent is a monoclonal antibody used for the treatment of asthma or atopic dermatitis. Patient educated on purpose, proper use and potential adverse effects of Dupixent. Possible adverse effects include increased risk of infection, ocular effects, vasculitis/eosinophilia, and hypersensitivity reactions. Administer as a SubQ injection and rotate sites. Allow the medication to reach room temp prior to administration (45 mins for 300 mg syringe or 30 min for 200 mg syringe). Do not  shake. Discard any unused portion. No recommendations for any changes.   Benard Halsted, PharmD, [facility] [place], [place] [phone]

## 2022-02-20 ENCOUNTER — Telehealth (INDEPENDENT_AMBULATORY_CARE_PROVIDER_SITE_OTHER): Payer: No Typology Code available for payment source | Admitting: Family Medicine

## 2022-02-20 ENCOUNTER — Encounter: Payer: Self-pay | Admitting: Family Medicine

## 2022-02-20 DIAGNOSIS — U071 COVID-19: Secondary | ICD-10-CM

## 2022-02-20 MED ORDER — NIRMATRELVIR/RITONAVIR (PAXLOVID)TABLET: ORAL_TABLET | ORAL | 0 refills | Status: DC

## 2022-02-20 NOTE — Patient Instructions (Addendum)
Paxlovid patient fact sheet: [email or web address].de   Over the counter medications that may be helpful for symptoms:  Guaifenesin 1200 mg extended release tabs twice daily, with plenty of water For cough and congestion Brand name: Mucinex   Pseudoephedrine 30 mg, one or two tabs every 4 to 6 hours For sinus congestion Brand name: Sudafed You must get this from the pharmacy counter.  Oxymetazoline nasal spray each morning, one spray in each nostril, for NO MORE THAN 3 days  For nasal and sinus congestion Brand name: Afrin Saline nasal spray or Saline Nasal Irrigation 3-5 times a day For nasal and sinus congestion Brand names: Ocean or AYR Fluticasone nasal spray, one spray in each nostril, each morning (after oxymetazoline and saline, if used) For nasal and sinus congestion Brand name: Flonase Warm salt water gargles  For sore throat Every few hours as needed Alternate ibuprofen 400-600 mg and acetaminophen 1000 mg every 4-6 hours For fever, body aches, headache Brand names: Motrin or Advil and Tylenol Dextromethorphan 12-hour cough version 30 mg every 12 hours  For cough Brand name: Delsym Stop all other cold medications for now (Nyquil, Dayquil, Tylenol Cold, Theraflu, etc) and other non-prescription cough/cold preparations. Many of these have the same ingredients listed above and could cause an overdose of medication.   Herbal treatments that have been shown to be helpful in some patients include: Vitamin C 1000mg  per day Vitamin D 4000iU per day Zinc 100mg  per day Quercetin 25-500mg  twice a day Melatonin 5-10mg  at bedtime  General Instructions Allow your body to rest Drink PLENTY of fluids Isolate yourself from everyone, even family, until test results have returned  If your COVID-19 test is positive Then you ARE INFECTED and you can pass the virus to others You must quarantine from others for a minimum of  5 days since symptoms started AND You  are fever free for 24 hours WITHOUT any medication to reduce fever AND Your symptoms are improving Wear mask for the next 5 days If not improved by day 5, quarantine for the full 10 days. Do not go to the store or other public areas Do not go around household members who are not known to be infected with COVID-19 If you MUST leave your area of quarantine (example: go to a bathroom you share with others in your home), you must Wear a mask Wash your hands thoroughly Wipe down any surfaces you touch Do not share food, drinks, towels, or other items with other persons Dispose of your own tissues, food containers, etc  Once you have recovered, please continue good preventive care measures, including:  wearing a mask when in public wash your hands frequently avoid touching your face/nose/eyes cover coughs/sneezes with the inside of your elbow stay out of crowds keep a 6 foot distance from others  If you develop severe shortness of breath, uncontrolled fevers, coughing up blood, confusion, chest pain, or signs of dehydration (such as significantly decreased urine amounts or dizziness with standing) please go to the ER.

## 2022-02-20 NOTE — Telephone Encounter (Signed)
Pt scheduled for virtual visit today with Caleen Jobs.

## 2022-02-20 NOTE — Progress Notes (Signed)
Virtual Video Visit via MyChart Note  I connected with  Tricia Potts on 02/20/22 at  1:40 PM EST by the video enabled telemedicine application for MyChart, and verified that I am speaking with the correct person using two identifiers.   I introduced myself as a Designer, jewellery with the practice. We discussed the limitations of evaluation and management by telemedicine and the availability of in person appointments. The patient expressed understanding and agreed to proceed.  Participating parties in this visit include: The patient and the nurse practitioner listed.  The patient is: At home I am: In the office - [facility]  Subjective:    CC: COVID +   HPI: Tricia Potts is a 51 y.o. year old female presenting today via [place] today for COVID +.  Patient reports she started feeling poorly 2 days ago.  She had a positive COVID test last night.  Symptoms have included sneezing, postnasal drip, congestion, headache, body aches, fatigue, low-grade fevers.  She denies any chest pain, dyspnea, nausea, vomiting, diarrhea, loss of taste or smell.  Reports she has plenty of over-the-counter medicines at home.  Given her comorbidities she would like to try antiviral therapy.    Past medical history, Surgical history, Family history not pertinant except as noted below, Social history, Allergies, and medications have been entered into the medical record, reviewed, and corrections made.   Review of Systems:  All review of systems negative except what is listed in the HPI   Objective:    General:  Speaking clearly in complete sentences. Absent shortness of breath noted.   Alert and oriented x3.   Normal judgment.  Absent acute distress.   Impression and Recommendations:    1. COVID-19 - nirmatrelvir/ritonavir EUA (PAXLOVID) 20 x 150 MG & 10 x 100MG  TABS; 1 dose p.o. twice daily for 5 days, may use any available formulation at pharmacy with the same total  dosage.  Dispense: 30 tablet; Refill: 0   Sending in Paxlovid given comorbidities. Education provided. Continue supportive measures including rest, hydration, humidifier use, steam showers, warm compresses to sinuses, warm liquids with lemon and honey, and over-the-counter cough, cold, and analgesics as needed.  Patient aware of signs/symptoms requiring further/urgent evaluation. Reports she has plenty of OTC meds at home already, does not need any more prescriptions. Aware not to take Lipitor while on Paxlovid.   Follow-up if symptoms worsen or fail to improve.    I discussed the assessment and treatment plan with the patient. The patient was provided an opportunity to ask questions and all were answered. The patient agreed with the plan and demonstrated an understanding of the instructions.   The patient was advised to call back or seek an in-person evaluation if the symptoms worsen or if the condition fails to improve as anticipated.  I spent 20 minutes dedicated to the care of this patient on the date of this encounter to include pre-visit chart review of prior notes and results, face-to-face time with the patient, and post-visit ordering of testing as indicated.   Terrilyn Saver, NP

## 2022-02-27 ENCOUNTER — Other Ambulatory Visit: Payer: Self-pay | Admitting: Family Medicine

## 2022-02-27 ENCOUNTER — Other Ambulatory Visit (HOSPITAL_COMMUNITY): Payer: Self-pay

## 2022-02-27 MED ORDER — FLUTICASONE PROPIONATE 50 MCG/ACT NA SUSP
NASAL | 1 refills | Status: DC
  Filled 2022-02-27: qty 48, fill #0
  Filled 2022-05-04: qty 32, 60d supply, fill #0
  Filled 2022-07-25: qty 32, 60d supply, fill #1
  Filled 2022-09-26: qty 32, 60d supply, fill #2

## 2022-03-01 ENCOUNTER — Other Ambulatory Visit (HOSPITAL_COMMUNITY): Payer: Self-pay

## 2022-03-08 ENCOUNTER — Other Ambulatory Visit (HOSPITAL_COMMUNITY): Payer: Self-pay

## 2022-03-14 ENCOUNTER — Other Ambulatory Visit (HOSPITAL_COMMUNITY): Payer: Self-pay

## 2022-04-02 ENCOUNTER — Other Ambulatory Visit (HOSPITAL_COMMUNITY): Payer: Self-pay

## 2022-04-04 ENCOUNTER — Other Ambulatory Visit (HOSPITAL_COMMUNITY): Payer: Self-pay

## 2022-04-05 ENCOUNTER — Other Ambulatory Visit (HOSPITAL_COMMUNITY): Payer: Self-pay

## 2022-04-06 ENCOUNTER — Other Ambulatory Visit (HOSPITAL_COMMUNITY): Payer: Self-pay

## 2022-04-09 ENCOUNTER — Other Ambulatory Visit (HOSPITAL_COMMUNITY): Payer: Self-pay

## 2022-04-10 ENCOUNTER — Encounter: Payer: Self-pay | Admitting: Family Medicine

## 2022-04-11 ENCOUNTER — Other Ambulatory Visit (HOSPITAL_COMMUNITY): Payer: Self-pay

## 2022-04-12 ENCOUNTER — Other Ambulatory Visit (HOSPITAL_BASED_OUTPATIENT_CLINIC_OR_DEPARTMENT_OTHER): Payer: Self-pay

## 2022-04-12 ENCOUNTER — Other Ambulatory Visit (HOSPITAL_COMMUNITY): Payer: Self-pay

## 2022-04-12 ENCOUNTER — Other Ambulatory Visit: Payer: Self-pay | Admitting: Pharmacist

## 2022-04-12 MED ORDER — DUPIXENT 300 MG/2ML ~~LOC~~ SOAJ
SUBCUTANEOUS | 4 refills | Status: DC
  Filled 2022-04-12: qty 4, fill #0

## 2022-04-12 MED ORDER — DUPIXENT 300 MG/2ML ~~LOC~~ SOAJ
SUBCUTANEOUS | 4 refills | Status: DC
  Filled 2022-04-12: qty 4, 28d supply, fill #0
  Filled 2022-05-02: qty 4, 28d supply, fill #1
  Filled 2022-05-28: qty 4, 28d supply, fill #2
  Filled 2022-06-28: qty 4, 28d supply, fill #3
  Filled 2022-07-27: qty 4, 28d supply, fill #4

## 2022-04-13 ENCOUNTER — Other Ambulatory Visit (HOSPITAL_COMMUNITY): Payer: Self-pay

## 2022-04-13 ENCOUNTER — Ambulatory Visit (INDEPENDENT_AMBULATORY_CARE_PROVIDER_SITE_OTHER): Payer: No Typology Code available for payment source | Admitting: Family

## 2022-04-13 VITALS — BP 105/72 | HR 62 | Temp 98.1°F | Resp 16 | Ht 60.3 in | Wt 163.0 lb

## 2022-04-13 DIAGNOSIS — N3000 Acute cystitis without hematuria: Secondary | ICD-10-CM

## 2022-04-13 DIAGNOSIS — R102 Pelvic and perineal pain: Secondary | ICD-10-CM

## 2022-04-13 DIAGNOSIS — N950 Postmenopausal bleeding: Secondary | ICD-10-CM

## 2022-04-13 LAB — POC URINALSYSI DIPSTICK (AUTOMATED)
Bilirubin, UA: NEGATIVE
Blood, UA: NEGATIVE
Glucose, UA: NEGATIVE
Ketones, UA: NEGATIVE
Leukocytes, UA: NEGATIVE
Nitrite, UA: NEGATIVE
Protein, UA: NEGATIVE
Spec Grav, UA: 1.02 (ref 1.010–1.025)
Urobilinogen, UA: 0.2 E.U./dL
pH, UA: 6 (ref 5.0–8.0)

## 2022-04-13 NOTE — Assessment & Plan Note (Signed)
New

## 2022-04-13 NOTE — Progress Notes (Addendum)
? ?Subjective:  ? ?By signing my name below, I, Tricia Potts, attest that this documentation has been prepared under the direction and in the presence of Tricia Alar NP, 04/13/2022   ? ? Patient ID: Tricia Potts, female    DOB: 1971/07/10, 51 y.o.   MRN: [id number] ? ?Chief Complaint  ?Patient presents with  ? Vaginal Bleeding  ?  Complains of blood stain in panty liner and when wipping   ? Pelvic Pain  ?  Complain of "aching around pelvic area from back to front"  ? ? ?HPI ? ?Patient is in today for an office visit ? ?Vaginal Spotting - She complains of vaginal spotting that began last week. She describes symptoms of pelvic discomfort and lower back pain. Her last menstruation was 2 years ago. She denies hx of a hysterectomy.  ? ?She also reports some generalized pelvic discomfort.  ? ?Health Maintenance Due  ?Topic Date Due  ? MAMMOGRAM  05/07/2020  ? Zoster Vaccines- Shingrix (2 of 2) 08/05/2020  ? ? ?Past Medical History:  ?Diagnosis Date  ? Allergy   ? Anemia 06/18/2014  ? Anxiety   ? Anxiety and depression 02/17/2009  ? Qualifier: Diagnosis of  By: Redmond Pulling MD, Frann Rider    ? Asthma   ? Broken ankle 2011  ? (left) roller skating  ? Bronchitis, mucopurulent recurrent ([place]) 04/21/2013  ? Cervical cancer screening 02/20/2016  ? Depression   ? GERD (gastroesophageal reflux disease)   ? Hemorrhoid   ? History of hidradenitis suppurativa   ? Hyperlipemia   ? IBS (irritable bowel syndrome)   ? Kidney infection   ? as a child  ? Low back pain 05/25/2016  ? Osteopenia   ? PCO (polycystic ovaries)   ? Pruritus 08/22/2017  ? ? ?Past Surgical History:  ?Procedure Laterality Date  ? ANKLE SURGERY    ? plate and 8 screws in left ankle  ? HEMORRHOID SURGERY    ? ? ?Family History  ?Problem Relation Age of Onset  ? Allergies Mother   ? COPD Mother   ? Irritable bowel syndrome Mother   ? Heart disease Father   ?     mitral valve disease/rupture during physical stress  ? GER disease Father   ? Irritable bowel syndrome Father   ?  Heart failure Father   ? Hepatitis C Brother   ? Cancer Brother 17  ?     ALL  ? Heart disease Daughter   ?     asd s/p repair at age 30  ? Anxiety disorder Daughter   ? Cancer Maternal Grandmother   ?     ovarian cancer  ? Heart disease Maternal Grandfather   ?     MI at 57  ? Kidney disease Paternal Grandfather   ?     possible kidney cancer  ? Allergies Sister   ? Eczema Sister   ? Cancer Sister 34  ?     breast, DCIS  ? Allergies Daughter   ? Kidney disease Paternal Grandmother   ? Ovarian cancer Other   ?     Grandmother  ? Arthritis Other   ? Irritable bowel syndrome Niece   ? Colon cancer Neg Hx   ? Rectal cancer Neg Hx   ? Esophageal cancer Neg Hx   ? Stomach cancer Neg Hx   ? ? ?Social History  ? ?Socioeconomic History  ? Marital status: Married  ?  Spouse name: Not on file  ?  Number of children: 3  ? Years of education: Not on file  ? Highest education level: Not on file  ?Occupational History  ? Occupation: Therapist, sports  ?  Employer: [employer]  ?Tobacco Use  ? Smoking status: Never  ? Smokeless tobacco: Never  ?Vaping Use  ? Vaping Use: Never used  ?Substance and Sexual Activity  ? Alcohol use: No  ? Drug use: No  ? Sexual activity: Yes  ?  Partners: Male  ?  Comment: perimenopausal  ?Other Topics Concern  ? Not on file  ?Social History Narrative  ? Works in interventional radiology-RN [employer]  ? Married  ? 3 daughters  ? ?Social Determinants of Health  ? ?Financial Resource Strain: Not on file  ?Food Insecurity: Not on file  ?Transportation Needs: Not on file  ?Physical Activity: Not on file  ?Stress: Not on file  ?Social Connections: Not on file  ?Intimate Partner Violence: Not on file  ? ? ?Outpatient Medications Prior to Visit  ?Medication Sig Dispense Refill  ? albuterol (VENTOLIN HFA) 108 (90 Base) MCG/ACT inhaler Inhale 2 puffs into the lungs every 6 (six) hours as needed for wheezing or shortness of breath. 18 g 2  ? ALPRAZolam (XANAX) 0.25 MG tablet Take 1 tablet by mouth 2 (two) times daily as needed for  anxiety. 40 tablet 1  ? atorvastatin (LIPITOR) 10 MG tablet TAKE 1 TABLET BY MOUTH ONCE DAILY 90 tablet 3  ? CALCIUM CITRATE PO Take 2 tablets by mouth daily.    ? Cholecalciferol (VITAMIN D) 50 MCG (2000 UT) CAPS Take by mouth.    ? clindamycin (CLEOCIN T) 1 % external solution Apply a small amount to skin 2 - 3 times a week for acne 60 mL 5  ? Dermatological Products, [facility]) lotion Apply topically 2 (two) times daily.    ? Dupilumab (DUPIXENT) 300 MG/2ML SOPN Inject 1 pen under the skin every other week as directed 4 mL 4  ? famotidine (PEPCID) 40 MG tablet TAKE 1 TABLET BY MOUTH AT BEDTIME 90 tablet 1  ? fexofenadine (SM FEXOFENADINE HCL) 180 MG tablet TAKE 1 TABLET BY MOUTH DAILY. 90 tablet 1  ? fluticasone (FLONASE) 50 MCG/ACT nasal spray PLACE 2 SPRAYS INTO THE NOSE DAILY 48 g 1  ? hydrocortisone cream 1 % Apply 1 application topically as needed for itching.    ? ibuprofen (ADVIL,MOTRIN) 200 MG tablet Take 400-800 mg by mouth every 6 (six) hours as needed for headache or mild pain.    ? montelukast (SINGULAIR) 10 MG tablet Take 1 tablet (10 mg total) by mouth at bedtime as needed. 90 tablet 3  ? Multiple Vitamin (MULTI-VITAMIN DAILY PO) Take by mouth daily.    ? olopatadine (PATANOL) 0.1 % ophthalmic solution PLACE 1 DROP INTO BOTH EYES 2 TIMES DAILY. 5 mL 12  ? OVER THE COUNTER MEDICATION Place 1 drop into both eyes as needed (allergy symptoms). OTC eye drops  ?Systane/Artificial Tears/Homeopathic eye drops    ? venlafaxine XR (EFFEXOR-XR) 75 MG 24 hr capsule TAKE 1 CAPSULE BY MOUTH DAILY WITH BREAKFAST 90 capsule 1  ? nirmatrelvir/ritonavir EUA (PAXLOVID) 20 x 150 MG & 10 x '100MG'$  TABS 1 dose p.o. twice daily for 5 days, may use any available formulation at pharmacy with the same total dosage. 30 tablet 0  ? nystatin ointment (MYCOSTATIN) Apply topically to affected areas 3 times a day for 14 consecutive days 30 g 0  ? triamcinolone cream (KENALOG) 0.1 % Apply 1  application topically 2 (two) times  daily. 80 g 1  ? ?No facility-administered medications prior to visit.  ? ? ?Allergies  ?Allergen Reactions  ? Back Pain-Off [Apap-Mag Salicylate-Caffeine]   ? ? ?Review of Systems  ?Gastrointestinal:   ?     (+) Pelvic Pain  ?Genitourinary:   ?     (+) Vaginal Spotting  ?Musculoskeletal:  Positive for back pain (Lower Back).  ? ?   ?Objective:  ?  ?Physical Exam ?Exam conducted with a chaperone present.  ?Constitutional:   ?   General: She is not in acute distress. ?   Appearance: Normal appearance. She is not ill-appearing.  ?HENT:  ?   Head: Normocephalic and atraumatic.  ?   Right Ear: External ear normal.  ?   Left Ear: External ear normal.  ?Eyes:  ?   Extraocular Movements: Extraocular movements intact.  ?   Pupils: Pupils are equal, round, and reactive to light.  ?Cardiovascular:  ?   Rate and Rhythm: Normal rate.  ?Pulmonary:  ?   Effort: Pulmonary effort is normal.  ?Genitourinary: ?   Exam position: Lithotomy position.  ?   Pubic Area: No rash.   ?   Labia:     ?   Right: No rash.     ?   Left: No rash.   ?   Urethra: No prolapse.  ?   Vagina: Normal.  ?   Cervix: Normal.  ?   Uterus: Normal.   ?   Adnexa: Right adnexa normal and left adnexa normal.    ?   Right: No mass.      ?   Left: No mass.    ?Skin: ?   General: Skin is warm and dry.  ?Neurological:  ?   Mental Status: She is alert and oriented to person, place, and time.  ?Psychiatric:     ?   Mood and Affect: Mood normal.     ?   Behavior: Behavior normal.     ?   Judgment: Judgment normal.  ? ? ?BP 105/72 (BP Location: Left Arm, Patient Position: Sitting, Cuff Size: Small)   Pulse 62   Temp 98.1 ?F (36.7 ?C) (Oral)   Resp 16   Ht 5' 0.3" (1.532 m)   Wt 163 lb (73.9 kg)   SpO2 98%   BMI 31.52 kg/m?  ?Wt Readings from Last 3 Encounters:  ?04/13/22 163 lb (73.9 kg)  ?12/28/21 161 lb 9.6 oz (73.3 kg)  ?06/01/21 160 lb 6.4 oz (72.8 kg)  ? ? ?   ?Assessment & Plan:  ? ?Problem List Items Addressed This Visit   ? ?  ? Unprioritized  ?  Post-menopausal bleeding - Primary  ?  New.  Normal pelvic exam. Will obtain pelvic US and refer to GYN for further evaluation.  ? ?  ?  ? Relevant Orders  ? Ambulatory referral to Obstetrics / Gynecology  ? Pelvic pain

## 2022-04-13 NOTE — Patient Instructions (Signed)
You should be contacted about your referral to GYN. ?Please schedule your pelvic ultrasound on the first floor.  ?

## 2022-04-14 LAB — URINE CULTURE
MICRO NUMBER:: 13296188
SPECIMEN QUALITY:: ADEQUATE

## 2022-04-15 ENCOUNTER — Telehealth: Payer: Self-pay | Admitting: Family

## 2022-04-15 DIAGNOSIS — N950 Postmenopausal bleeding: Secondary | ICD-10-CM | POA: Insufficient documentation

## 2022-04-15 NOTE — Assessment & Plan Note (Signed)
New.  Normal pelvic exam. Will obtain pelvic US and refer to GYN for further evaluation.  ?

## 2022-04-15 NOTE — Telephone Encounter (Signed)
See mychart.  

## 2022-04-16 ENCOUNTER — Other Ambulatory Visit (HOSPITAL_COMMUNITY): Payer: Self-pay

## 2022-04-17 ENCOUNTER — Other Ambulatory Visit (HOSPITAL_COMMUNITY): Payer: Self-pay

## 2022-04-17 MED ORDER — AMOXICILLIN 500 MG PO CAPS
500.0000 mg | ORAL_CAPSULE | Freq: Three times a day (TID) | ORAL | 0 refills | Status: AC
  Filled 2022-04-17: qty 21, 7d supply, fill #0

## 2022-04-17 NOTE — Addendum Note (Signed)
Addended by: [name] on: 04/17/2022 09:36 AM ? ? Modules accepted: Orders ? ?

## 2022-04-18 ENCOUNTER — Ambulatory Visit (HOSPITAL_BASED_OUTPATIENT_CLINIC_OR_DEPARTMENT_OTHER)
Admission: RE | Admit: 2022-04-18 | Discharge: 2022-04-18 | Disposition: A | Discharge status: Home / Self Care | Payer: No Typology Code available for payment source | Source: Ambulatory Visit | Attending: Family | Admitting: Family

## 2022-04-18 ENCOUNTER — Inpatient Hospital Stay (HOSPITAL_BASED_OUTPATIENT_CLINIC_OR_DEPARTMENT_OTHER): Admission: RE | Admit: 2022-04-18 | Payer: No Typology Code available for payment source | Source: Ambulatory Visit

## 2022-04-18 DIAGNOSIS — N3000 Acute cystitis without hematuria: Secondary | ICD-10-CM | POA: Insufficient documentation

## 2022-04-26 ENCOUNTER — Other Ambulatory Visit (HOSPITAL_COMMUNITY): Payer: Self-pay

## 2022-05-02 ENCOUNTER — Ambulatory Visit (HOSPITAL_BASED_OUTPATIENT_CLINIC_OR_DEPARTMENT_OTHER)
Admission: RE | Admit: 2022-05-02 | Discharge: 2022-05-02 | Disposition: A | Discharge status: Home / Self Care | Payer: No Typology Code available for payment source | Source: Ambulatory Visit | Attending: Family Medicine | Admitting: Family Medicine

## 2022-05-02 ENCOUNTER — Other Ambulatory Visit (HOSPITAL_COMMUNITY): Payer: Self-pay

## 2022-05-02 ENCOUNTER — Encounter (HOSPITAL_BASED_OUTPATIENT_CLINIC_OR_DEPARTMENT_OTHER): Payer: Self-pay

## 2022-05-02 DIAGNOSIS — Z1239 Encounter for other screening for malignant neoplasm of breast: Secondary | ICD-10-CM | POA: Diagnosis present

## 2022-05-02 DIAGNOSIS — Z1231 Encounter for screening mammogram for malignant neoplasm of breast: Secondary | ICD-10-CM | POA: Insufficient documentation

## 2022-05-04 ENCOUNTER — Other Ambulatory Visit: Payer: Self-pay | Admitting: Family Medicine

## 2022-05-04 ENCOUNTER — Other Ambulatory Visit (HOSPITAL_COMMUNITY): Payer: Self-pay

## 2022-05-04 MED ORDER — VENLAFAXINE HCL ER 75 MG PO CP24
ORAL_CAPSULE | Freq: Every day | ORAL | 1 refills | Status: DC
  Filled 2022-05-04: qty 90, 90d supply, fill #0
  Filled 2022-07-25: qty 90, 90d supply, fill #1

## 2022-05-10 ENCOUNTER — Encounter: Payer: Self-pay | Admitting: Family Medicine

## 2022-05-10 ENCOUNTER — Other Ambulatory Visit (HOSPITAL_COMMUNITY): Payer: Self-pay

## 2022-05-10 ENCOUNTER — Other Ambulatory Visit: Payer: Self-pay | Admitting: Family Medicine

## 2022-05-10 DIAGNOSIS — M545 Low back pain, unspecified: Secondary | ICD-10-CM

## 2022-05-10 MED ORDER — ATORVASTATIN CALCIUM 10 MG PO TABS
ORAL_TABLET | Freq: Every day | ORAL | 3 refills | Status: DC
Start: 2022-05-10 — End: 2022-06-06
  Filled 2022-05-10: qty 90, 90d supply, fill #0

## 2022-05-17 NOTE — Telephone Encounter (Signed)
Could you look into her OBGYN referral? I'm not sure what number or office she should be calling. Thank you!

## 2022-05-18 NOTE — Telephone Encounter (Signed)
She called that number and couldn't get through. I called x2 to see if the number worked and I also get a message "number cannot be completed as dialed". Do they have a different number? Or did that location close? Thank you!

## 2022-05-22 NOTE — Telephone Encounter (Signed)
Pt works at [employer] and would prefer an office close to [place] or [employer]. Thanks!

## 2022-05-22 NOTE — Therapy (Signed)
OUTPATIENT PHYSICAL THERAPY EVALUATION   Patient Name: Tricia Potts MRN: [id number] DOB:1971/07/15, 51 y.o., female Today's Date: 05/23/2022   PT End of Session - 05/23/22 0759     Visit Number 1    Number of Visits 20    Date for PT Re-Evaluation 08/01/22    Authorization Type [employer] $40 copay    PT Start Time 0800    PT Stop Time 0839    PT Time Calculation (min) 39 min    Activity Tolerance Patient tolerated treatment well    Behavior During Therapy [facility] for tasks assessed/performed             Past Medical History:  Diagnosis Date   Allergy    Anemia 06/18/2014   Anxiety    Anxiety and depression 02/17/2009   Qualifier: Diagnosis of  By: Redmond Pulling MD, LauraLee     Asthma    Broken ankle 2011   (left) roller skating   Bronchitis, mucopurulent recurrent ([place]) 04/21/2013   Cervical cancer screening 02/20/2016   Depression    GERD (gastroesophageal reflux disease)    Hemorrhoid    History of hidradenitis suppurativa    Hyperlipemia    IBS (irritable bowel syndrome)    Kidney infection    as a child   Low back pain 05/25/2016   Osteopenia    PCO (polycystic ovaries)    Pruritus 08/22/2017   Past Surgical History:  Procedure Laterality Date   ANKLE SURGERY     plate and 8 screws in left ankle   HEMORRHOID SURGERY     Patient Active Problem List   Diagnosis Date Noted   Post-menopausal bleeding 04/15/2022   Pelvic pain 04/13/2022   Epiretinal membrane 04/20/2020   Low vitamin D level 04/01/2020   Hyperglycemia 02/14/2020   Osteopenia 11/02/2019   Acid reflux 11/02/2019   Perimenopause 04/06/2019   Low back pain 05/25/2016   Cervical cancer screening 02/20/2016   Anemia 06/18/2014   Preventative health care 04/23/2013   Asthma 01/22/2011   Hyperlipidemia, mixed 02/17/2009   Anxiety and depression 02/17/2009   HEMORRHOIDS 02/17/2009   Allergic rhinitis 02/17/2009   Bronchitis with asthma, subacute 02/17/2009   IRRITABLE BOWEL SYNDROME 02/17/2009    Dermatitis 02/17/2009    PCP: Mosie Lukes, MD  REFERRING PROVIDER: Mosie Lukes, MD  REFERRING DIAG: M54.50 (ICD-10-CM) - Low back pain, unspecified back pain laterality, unspecified chronicity, unspecified whether sciatica present  Rationale for Evaluation and Treatment Rehabilitation  THERAPY DIAG:  Other low back pain  Cervicalgia  Abnormal posture  Muscle weakness (generalized)  Difficulty in walking, not elsewhere classified  ONSET DATE: At least a year 04/23/2021  SUBJECTIVE:  SUBJECTIVE STATEMENT: Pt indicated feeling pain lower back/upper buttock with feelings of tilted forward positioning. Insidious onset.  Pt indicated prolonged sitting/standing complaints that creates achy in back.  Pt also indicated complaints of some numbness/tingling in hands while driving and can also note while doing things like blow drying hair - relieved by changing positioning.  Pt. Indicated tightness in neck/upper trap region.   Pt indicated history of massage every few months for generic care.  Has started doing some stretching (from internet) with some improvement noted at times. Pt indicated some trouble sleeping at times (numbness in hands)  PERTINENT HISTORY:  Hyperlipidemia, depression, IBS, osteopenia  PAIN:  NPRS scale: at current 1-2/10, at worst 5-6/10 Pain location: back Pain description: achy Aggravating factors: prolonged sitting/standing Relieving factors: changing positioning   PRECAUTIONS: None  WEIGHT BEARING RESTRICTIONS No  FALLS:  Has patient fallen in last 6 months? 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)  PLOF: Independent, walking, yard work  PATIENT GOALS Reduce pain, improve  flexibility   OBJECTIVE:   PATIENT SURVEYS:  05/23/2022: FOTO intake:  70  predicted: 74   SCREENING FOR RED FLAGS: 05/23/2022 Bowel or bladder incontinence: No Spinal tumors: No Cauda equina syndrome: No Compression fracture: No Abdominal aneurysm: No  COGNITION: 05/23/2022 Overall cognitive status: Within functional limits for tasks assessed     SENSATION:  05/23/2022 [facility]  MUSCLE LENGTH: 05/23/2022 Hamstrings( supine passive SLR ): Right 90 deg; Left 90 deg   POSTURE:  05/23/2022 : Rounded shoulders, mild forward head posture in sitting/standing  PALPATION: 05/23/2022   CERVICAL ROM:  05/23/2022:  not tested today due to time  LUMBAR ROM:   05/23/2022: no centralization or peripheralization noted in lumbar movements AROM AROM  Eval 05/23/2022  Flexion Movement to toes, no back pain  Extension 50% c lumbar pain in end range  Repeated x 5 in standing:  improvement 75%   Right lateral flexion Lateral femoral epicondyle c no complaints  Left lateral flexion Lateral femoral epicondyle c low back pain  Right rotation   Left rotation    (Blank rows = not tested)  LOWER EXTREMITY ROM:     Active  Right eval Left eval  Hip flexion    Hip extension    Hip abduction    Hip adduction    Hip internal rotation    Hip external rotation    Knee flexion    Knee extension    Ankle dorsiflexion    Ankle plantarflexion    Ankle inversion    Ankle eversion     (Blank rows = not tested)  LOWER EXTREMITY MMT:    MMT Right eval Left eval  Hip flexion 5/5 5/5  Hip extension 5/5 5/5  Hip abduction 4/5 4+/5  Hip adduction    Hip internal rotation    Hip external rotation    Knee flexion 5/5 5/5  Knee extension 5/5 5/5  Ankle dorsiflexion 5/5 5/5  Ankle plantarflexion    Ankle inversion    Ankle eversion     (Blank rows = not tested)  LUMBAR SPECIAL TESTS:  05/23/2022 :  Lt slump: (-)  Rt slump: (-)   Crossed SLR Lt: (-) Rt: (-)  GAIT: 05/23/2022 Independent,  unremarkable   TODAY'S TREATMENT  05/23/2022 Therex:  HEP instruction/performance c cues for techniques, handout provided.  Trial set performed of each for comprehension and symptom assessment.  See below for exercise list.  PATIENT EDUCATION:  Education details: HEP, POC Person educated: Patient Education method: Consulting civil engineer, Demonstration, Verbal cues, and Handouts Education comprehension: verbalized understanding, returned demonstration, and verbal cues required   HOME EXERCISE PROGRAM: Access Code: [email or web address] URL: [email or web address] Date: 05/23/2022 Prepared by: Scot Jun  Exercises - Supine Lower Trunk Rotation  - 2-3 x daily - 7 x weekly - 1 sets - 3-5 reps - 15 hold - Standing Lumbar Extension with Counter  - 3-5 x daily - 7 x weekly - 1 sets - 5-10 reps - Sidelying Hip Abduction  - 1-2 x daily - 7 x weekly - 2-3 sets - 10-15 reps - Supine Bridge  - 1-2 x daily - 7 x weekly - 2-3 sets - 10 reps - 2 hold - Standing Hip Hiking  - 1-2 x daily - 7 x weekly - 1 sets - 10 reps - 5 hold - Supine Piriformis Stretch  - 1-2 x daily - 7 x weekly - 1 sets - 5 reps - 30 hold - Seated Scapular Retraction  - 3-5 x daily - 7 x weekly - 1 sets - 5 reps - 3-5 hold  ASSESSMENT:  CLINICAL IMPRESSION: Patient is a 51 y.o. who comes to clinic with complaints of low back pain with mobility, strength deficits as well as cervical pain c intermittent bilateral UE numbness that impair their ability to perform usual daily and recreational functional activities without increase difficulty/symptoms at this time.  Patient to benefit from skilled PT services to address impairments and limitations to improve to previous level of function without restriction secondary to condition.    OBJECTIVE IMPAIRMENTS decreased activity tolerance, decreased coordination, decreased mobility, difficulty walking, decreased ROM, decreased strength, hypomobility, increased fascial restrictions, impaired  perceived functional ability, impaired flexibility, impaired UE functional use, improper body mechanics, postural dysfunction, and pain.   ACTIVITY LIMITATIONS carrying, lifting, bending, sitting, standing, sleeping, and locomotion level  PARTICIPATION LIMITATIONS: cleaning, laundry, driving, community activity, and yard work  PERSONAL FACTORS Hyperlipidemia, depression, IBS, osteopenia are also affecting patient's functional outcome.   REHAB POTENTIAL: Good  CLINICAL DECISION MAKING: Stable/uncomplicated  EVALUATION COMPLEXITY: Low   GOALS: Goals reviewed with patient? Yes  Short term PT Goals (target date for Short term goals are 3 weeks 06/13/2022) Patient will demonstrate independent use of home exercise program to maintain progress from in clinic treatments. Goal status: New   Long term PT goals (target dates for all long term goals are 10 weeks  08/01/2022 )  1. Patient will demonstrate/report pain at worst less than or equal to 2/10 to facilitate minimal limitation in daily activity secondary to pain symptoms. Goal status: New  2. Patient will demonstrate independent use of home exercise program to facilitate ability to maintain/progress functional gains from skilled physical therapy services. Goal status: New  3. Patient will demonstrate FOTO outcome > or = 74 % to indicate reduced disability due to condition. Goal status: New  4.  Patient will demonstrate lumbar AROM extension 100 % WFL, lateral flexion to knee jt s symptoms bilateral to facilitate usual mobility in lumbar region.  Goal status: New  5.  Patient will demonstrate bilateral hip MMT 5/5 throughout to facilitate strength and stability in daily activity.    Goal status: New  6.  Patient will demonstrate/report ability to sleep s restriction due to symptoms.   Goal status: New   PLAN: PT FREQUENCY: 1-2x/week  PT DURATION: 10 weeks  PLANNED INTERVENTIONS: Therapeutic exercises, Therapeutic activity,  Neuro Muscular re-education, Balance training,  Gait training, Patient/Family education, Joint mobilization, Stair training, DME instructions, Dry Needling, Electrical stimulation, Cryotherapy, Moist heat, Taping, Ultrasound, Ionotophoresis '4mg'$ /ml Dexamethasone, and Manual therapy.  All included unless contraindicated.  PLAN FOR NEXT SESSION: Review existing HEP response, addition of core strengthening.  Check hip flexor mobility.   Scot Jun, PT, DPT, OCS, ATC 05/23/22  8:47 AM

## 2022-05-23 ENCOUNTER — Ambulatory Visit: Payer: No Typology Code available for payment source | Admitting: Rehabilitative and Restorative Service Providers"

## 2022-05-23 ENCOUNTER — Encounter: Payer: Self-pay | Admitting: Rehabilitative and Restorative Service Providers"

## 2022-05-23 ENCOUNTER — Other Ambulatory Visit: Payer: Self-pay

## 2022-05-23 DIAGNOSIS — R293 Abnormal posture: Secondary | ICD-10-CM

## 2022-05-23 DIAGNOSIS — M5459 Other low back pain: Secondary | ICD-10-CM

## 2022-05-23 DIAGNOSIS — M6281 Muscle weakness (generalized): Secondary | ICD-10-CM

## 2022-05-23 DIAGNOSIS — R262 Difficulty in walking, not elsewhere classified: Secondary | ICD-10-CM

## 2022-05-23 DIAGNOSIS — M542 Cervicalgia: Secondary | ICD-10-CM

## 2022-05-28 ENCOUNTER — Other Ambulatory Visit (HOSPITAL_COMMUNITY): Payer: Self-pay

## 2022-05-31 ENCOUNTER — Encounter: Payer: Self-pay | Admitting: Physical Therapy

## 2022-05-31 ENCOUNTER — Other Ambulatory Visit (HOSPITAL_COMMUNITY): Payer: Self-pay

## 2022-05-31 ENCOUNTER — Ambulatory Visit: Payer: No Typology Code available for payment source | Admitting: Physical Therapy

## 2022-05-31 DIAGNOSIS — R293 Abnormal posture: Secondary | ICD-10-CM

## 2022-05-31 DIAGNOSIS — R262 Difficulty in walking, not elsewhere classified: Secondary | ICD-10-CM

## 2022-05-31 DIAGNOSIS — M5459 Other low back pain: Secondary | ICD-10-CM

## 2022-05-31 DIAGNOSIS — M6281 Muscle weakness (generalized): Secondary | ICD-10-CM

## 2022-05-31 DIAGNOSIS — M542 Cervicalgia: Secondary | ICD-10-CM | POA: Diagnosis not present

## 2022-05-31 NOTE — Therapy (Signed)
OUTPATIENT PHYSICAL THERAPY TREATMENT NOTE   Patient Name: Tricia Potts MRN: [id number] DOB:1971/07/19, 51 y.o., female Today's Date: 05/31/2022  END OF SESSION:   PT End of Session - 05/31/22 0809     Visit Number 2    Number of Visits 20    Date for PT Re-Evaluation 08/01/22    Authorization Type [employer] $40 copay    PT Start Time 0802    PT Stop Time 0845    PT Time Calculation (min) 43 min    Activity Tolerance Patient tolerated treatment well    Behavior During Therapy [facility] for tasks assessed/performed             Past Medical History:  Diagnosis Date   Allergy    Anemia 06/18/2014   Anxiety    Anxiety and depression 02/17/2009   Qualifier: Diagnosis of  By: Redmond Pulling MD, LauraLee     Asthma    Broken ankle 2011   (left) roller skating   Bronchitis, mucopurulent recurrent ([place]) 04/21/2013   Cervical cancer screening 02/20/2016   Depression    GERD (gastroesophageal reflux disease)    Hemorrhoid    History of hidradenitis suppurativa    Hyperlipemia    IBS (irritable bowel syndrome)    Kidney infection    as a child   Low back pain 05/25/2016   Osteopenia    PCO (polycystic ovaries)    Pruritus 08/22/2017   Past Surgical History:  Procedure Laterality Date   ANKLE SURGERY     plate and 8 screws in left ankle   HEMORRHOID SURGERY     Patient Active Problem List   Diagnosis Date Noted   Post-menopausal bleeding 04/15/2022   Pelvic pain 04/13/2022   Epiretinal membrane 04/20/2020   Low vitamin D level 04/01/2020   Hyperglycemia 02/14/2020   Osteopenia 11/02/2019   Acid reflux 11/02/2019   Perimenopause 04/06/2019   Low back pain 05/25/2016   Cervical cancer screening 02/20/2016   Anemia 06/18/2014   Preventative health care 04/23/2013   Asthma 01/22/2011   Hyperlipidemia, mixed 02/17/2009   Anxiety and depression 02/17/2009   HEMORRHOIDS 02/17/2009   Allergic rhinitis 02/17/2009   Bronchitis with asthma, subacute 02/17/2009   IRRITABLE BOWEL  SYNDROME 02/17/2009   Dermatitis 02/17/2009    THERAPY DIAG:  Other low back pain  Cervicalgia  Abnormal posture  Muscle weakness (generalized)  Difficulty in walking, not elsewhere classified  PCP: Mosie Lukes, MD   REFERRING PROVIDER: Mosie Lukes, MD   REFERRING DIAG: M54.50 (ICD-10-CM) - Low back pain, unspecified back pain laterality, unspecified chronicity, unspecified whether sciatica present   Rationale for Evaluation and Treatment Rehabilitation   ONSET DATE: At least a year 04/23/2021   SUBJECTIVE:  SUBJECTIVE STATEMENT: Pt states the pain is overall about the same   PERTINENT HISTORY:  Hyperlipidemia, depression, IBS, osteopenia   PAIN:  NPRS scale: at current 2/10 Pain location: back Pain description: achy Aggravating factors: prolonged sitting/standing Relieving factors: changing positioning     PRECAUTIONS: None   WEIGHT BEARING RESTRICTIONS No   FALLS:  Has patient fallen in last 6 months? 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)   PLOF: Independent, walking, yard work   PATIENT GOALS Reduce pain, improve flexibility     OBJECTIVE:    PATIENT SURVEYS:  05/23/2022: FOTO intake:  70  predicted: 74    SCREENING FOR RED FLAGS: 05/23/2022 Bowel or bladder incontinence: No Spinal tumors: No Cauda equina syndrome: No Compression fracture: No Abdominal aneurysm: No   COGNITION: 05/23/2022 Overall cognitive status: Within functional limits for tasks assessed                               SENSATION:  05/23/2022 [facility]   MUSCLE LENGTH: 05/23/2022 Hamstrings( supine passive SLR ): Right 90 deg; Left 90 deg     POSTURE:  05/23/2022 : Rounded shoulders, mild forward  head posture in sitting/standing   PALPATION: 05/23/2022    CERVICAL ROM:  05/23/2022:  not tested today due to time   LUMBAR ROM:             05/23/2022: no centralization or peripheralization noted in lumbar movements AROM AROM  Eval 05/23/2022  Flexion Movement to toes, no back pain  Extension 50% c lumbar pain in end range   Repeated x 5 in standing:  improvement 75%    Right lateral flexion Lateral femoral epicondyle c no complaints  Left lateral flexion Lateral femoral epicondyle c low back pain  Right rotation    Left rotation     (Blank rows = not tested)   LOWER EXTREMITY ROM:      Active  Right eval Left eval  Hip flexion      Hip extension      Hip abduction      Hip adduction      Hip internal rotation      Hip external rotation      Knee flexion      Knee extension      Ankle dorsiflexion      Ankle plantarflexion      Ankle inversion      Ankle eversion       (Blank rows = not tested)   LOWER EXTREMITY MMT:     MMT Right eval Left eval  Hip flexion 5/5 5/5  Hip extension 5/5 5/5  Hip abduction 4/5 4+/5  Hip adduction      Hip internal rotation      Hip external rotation      Knee flexion 5/5 5/5  Knee extension 5/5 5/5  Ankle dorsiflexion 5/5 5/5  Ankle plantarflexion      Ankle inversion      Ankle eversion       (Blank rows = not tested)   LUMBAR SPECIAL TESTS:  05/23/2022 :      Lt slump: (-)      Rt slump: (-)                         Crossed SLR Lt: (-)  Rt: (-)   GAIT: 05/23/2022 Independent, unremarkable     TODAY'S TREATMENT  05/31/22 Nu step X 5 min L5 UE/LE Supine LTR 10 sec X 5 bilat Supine SKTC stretch 30 sec X 3 bilat Supine piriformis stretch 30 sec X 3 bilat Supine hip flexor stretch 30 sec X3 Supine bridges 5 sec X 15 Sidelying hip abduction X 15 bilat Seated on pball rows and extensions with green X 20 Seated on pball marches X 10 bilat Standing hip hike X 15 bilat Standing lumbar extensions X 15 holding 5  sec Doorway stretch low 10 sec X 10 Standing cervical retractions 5 sec X 15  05/23/2022 Therex:            HEP instruction/performance c cues for techniques, handout provided.  Trial set performed of each for comprehension and symptom assessment.  See below for exercise list.     PATIENT EDUCATION:  Education details: HEP, POC Person educated: Patient Education method: Explanation, Demonstration, Verbal cues, and Handouts Education comprehension: verbalized understanding, returned demonstration, and verbal cues required     HOME EXERCISE PROGRAM: Access Code: [email or web address] URL: [email or web address] Date: 05/23/2022 Prepared by: Scot Jun   Exercises - Supine Lower Trunk Rotation  - 2-3 x daily - 7 x weekly - 1 sets - 3-5 reps - 15 hold - Standing Lumbar Extension with Counter  - 3-5 x daily - 7 x weekly - 1 sets - 5-10 reps - Sidelying Hip Abduction  - 1-2 x daily - 7 x weekly - 2-3 sets - 10-15 reps - Supine Bridge  - 1-2 x daily - 7 x weekly - 2-3 sets - 10 reps - 2 hold - Standing Hip Hiking  - 1-2 x daily - 7 x weekly - 1 sets - 10 reps - 5 hold - Supine Piriformis Stretch  - 1-2 x daily - 7 x weekly - 1 sets - 5 reps - 30 hold - Seated Scapular Retraction  - 3-5 x daily - 7 x weekly - 1 sets - 5 reps - 3-5 hold   ASSESSMENT:   CLINICAL IMPRESSION: Patient did have some tightness in her hip flexor so added stretch for this today. We reviewed HEP and she showed good understanding and return demonstration with these. I added a little more core strength into her program as well with good overall tolerance. She had some numbness in her left hand and first 3 fingers today so also showed her median nerve glide and cervical retractions to perform. Cervical retraction appeared to help the most with this today.     OBJECTIVE IMPAIRMENTS decreased activity tolerance, decreased coordination, decreased mobility, difficulty walking, decreased ROM, decreased strength,  hypomobility, increased fascial restrictions, impaired perceived functional ability, impaired flexibility, impaired UE functional use, improper body mechanics, postural dysfunction, and pain.    ACTIVITY LIMITATIONS carrying, lifting, bending, sitting, standing, sleeping, and locomotion level   PARTICIPATION LIMITATIONS: cleaning, laundry, driving, community activity, and yard work   PERSONAL FACTORS Hyperlipidemia, depression, IBS, osteopenia are also affecting patient's functional outcome.    REHAB POTENTIAL: Good   CLINICAL DECISION MAKING: Stable/uncomplicated   EVALUATION COMPLEXITY: Low     GOALS: Goals reviewed with patient? Yes   Short term PT Goals (target date for Short term goals are 3 weeks 06/13/2022) Patient will demonstrate independent use of home exercise program to maintain progress from in clinic treatments. Goal status: New   Long term PT goals (target dates for all long term goals are 10 weeks  08/01/2022 )  1. Patient will demonstrate/report pain at worst less than or equal to 2/10 to facilitate minimal limitation in daily activity secondary to pain symptoms. Goal status: New   2. Patient will demonstrate independent use of home exercise program to facilitate ability to maintain/progress functional gains from skilled physical therapy services. Goal status: New   3. Patient will demonstrate FOTO outcome > or = 74 % to indicate reduced disability due to condition. Goal status: New   4.  Patient will demonstrate lumbar AROM extension 100 % WFL, lateral flexion to knee jt s symptoms bilateral to facilitate usual mobility in lumbar region.  Goal status: New   5.  Patient will demonstrate bilateral hip MMT 5/5 throughout to facilitate strength and stability in daily activity.    Goal status: New   6.  Patient will demonstrate/report ability to sleep s restriction due to symptoms.   Goal status: New     PLAN: PT FREQUENCY: 1-2x/week   PT DURATION: 10 weeks    PLANNED INTERVENTIONS: Therapeutic exercises, Therapeutic activity, Neuro Muscular re-education, Balance training, Gait training, Patient/Family education, Joint mobilization, Stair training, DME instructions, Dry Needling, Electrical stimulation, Cryotherapy, Moist heat, Taping, Ultrasound, Ionotophoresis '4mg'$ /ml Dexamethasone, and Manual therapy.  All included unless contraindicated.   PLAN FOR NEXT SESSION: how are exercises going, how is cervical radiculopathy?   Debbe Odea, PT,DPT 05/31/2022, 8:10 AM

## 2022-06-04 ENCOUNTER — Encounter: Payer: No Typology Code available for payment source | Admitting: Family Medicine

## 2022-06-04 NOTE — Progress Notes (Signed)
Subjective:    Patient ID: Tricia Potts, female    DOB: August 07, 1971, 51 y.o.   MRN: [id number]  Chief Complaint  Patient presents with   Annual Exam    HPI Patient is in today for her annual physical exam and follow  up on chronic medical concerns.  Overall she is doing well.  No recent febrile illness or hospitalizations.  She is working with a therapist through talk space and doing a new breathing technique called 478 breathing she does feel those things are helping her stress.  She is having ongoing stress at work and at home and is struggling with her relationship with her 31 year old daughter but is largely able to manage on a day-to-day basis.  On her venlafaxine 75 mg she does not need alprazolam often but when she needs it it is helpful.  She is trying to stay active and maintain a heart healthy diet. Denies CP/palp/SOB/HA/congestion/fevers/GI or GU c/o. Taking meds as prescribed. Is following with podiatry and PT for foot pain and neck pain with paresthesias.   Past Medical History:  Diagnosis Date   Allergy    Anemia 06/18/2014   Anxiety    Anxiety and depression 02/17/2009   Qualifier: Diagnosis of  By: Redmond Pulling MD, LauraLee     Asthma    Broken ankle 2011   (left) roller skating   Bronchitis, mucopurulent recurrent ([place]) 04/21/2013   Cervical cancer screening 02/20/2016   Depression    GERD (gastroesophageal reflux disease)    Hemorrhoid    History of hidradenitis suppurativa    Hyperlipemia    IBS (irritable bowel syndrome)    Kidney infection    as a child   Low back pain 05/25/2016   Osteopenia    PCO (polycystic ovaries)    Pruritus 08/22/2017    Past Surgical History:  Procedure Laterality Date   ANKLE SURGERY     plate and 8 screws in left ankle   HEMORRHOID SURGERY      Family History  Problem Relation Age of Onset   Allergies Mother    COPD Mother    Irritable bowel syndrome Mother    Heart disease Father        mitral valve disease/rupture during  physical stress   GER disease Father    Irritable bowel syndrome Father    Heart failure Father    Hepatitis C Brother    Cancer Brother 19       ALL   Heart disease Daughter        asd s/p repair at age 3   Anxiety disorder Daughter    Cancer Maternal Grandmother        ovarian cancer   Heart disease Maternal Grandfather        MI at 69   Kidney disease Paternal Grandfather        possible kidney cancer   Allergies Sister    Eczema Sister    Cancer Sister 32       breast, DCIS   Allergies Daughter    Kidney disease Paternal Grandmother    Ovarian cancer Other        Grandmother   Arthritis Other    Irritable bowel syndrome Niece    Colon cancer Neg Hx    Rectal cancer Neg Hx    Esophageal cancer Neg Hx    Stomach cancer Neg Hx     Social History   Socioeconomic History   Marital status: Married  Spouse name: Not on file   Number of children: 3   Years of education: Not on file   Highest education level: Not on file  Occupational History   Occupation: Programmer, multimedia: [employer]  Tobacco Use   Smoking status: Never   Smokeless tobacco: Never  Vaping Use   Vaping Use: Never used  Substance and Sexual Activity   Alcohol use: No   Drug use: No   Sexual activity: Yes    Partners: Male    Comment: perimenopausal  Other Topics Concern   Not on file  Social History Narrative   Works in interventional radiology-RN [employer]   Married   3 daughters   Social Determinants of Health   Financial Resource Strain: Not on file  Food Insecurity: Not on file  Transportation Needs: Not on file  Physical Activity: Not on file  Stress: Not on file  Social Connections: Not on file  Intimate Partner Violence: Not on file    Outpatient Medications Prior to Visit  Medication Sig Dispense Refill   albuterol (VENTOLIN HFA) 108 (90 Base) MCG/ACT inhaler Inhale 2 puffs into the lungs every 6 (six) hours as needed for wheezing or shortness of breath. 18 g 2   atorvastatin  (LIPITOR) 10 MG tablet TAKE 1 TABLET BY MOUTH ONCE DAILY 90 tablet 3   CALCIUM CITRATE PO Take 2 tablets by mouth daily.     Cholecalciferol (VITAMIN D) 50 MCG (2000 UT) CAPS Take by mouth.     clindamycin (CLEOCIN T) 1 % external solution Apply a small amount to skin 2 - 3 times a week for acne 60 mL 5   Dermatological Products, Misc. [facility]) lotion Apply topically 2 (two) times daily.     Dupilumab (DUPIXENT) 300 MG/2ML SOPN Inject 1 pen under the skin every other week as directed 4 mL 4   famotidine (PEPCID) 40 MG tablet TAKE 1 TABLET BY MOUTH AT BEDTIME 90 tablet 1   fexofenadine (SM FEXOFENADINE HCL) 180 MG tablet TAKE 1 TABLET BY MOUTH DAILY. 90 tablet 1   fluticasone (FLONASE) 50 MCG/ACT nasal spray PLACE 2 SPRAYS INTO EACH NOSTRIL DAILY 48 g 1   hydrocortisone cream 1 % Apply 1 application topically as needed for itching.     ibuprofen (ADVIL,MOTRIN) 200 MG tablet Take 400-800 mg by mouth every 6 (six) hours as needed for headache or mild pain.     montelukast (SINGULAIR) 10 MG tablet Take 1 tablet (10 mg total) by mouth at bedtime as needed. 90 tablet 3   Multiple Vitamin (MULTI-VITAMIN DAILY PO) Take by mouth daily.     olopatadine (PATANOL) 0.1 % ophthalmic solution PLACE 1 DROP INTO BOTH EYES 2 TIMES DAILY. 5 mL 12   OVER THE COUNTER MEDICATION Place 1 drop into both eyes as needed (allergy symptoms). OTC eye drops  Systane/Artificial Tears/Homeopathic eye drops     venlafaxine XR (EFFEXOR-XR) 75 MG 24 hr capsule TAKE 1 CAPSULE BY MOUTH DAILY WITH BREAKFAST 90 capsule 1   ALPRAZolam (XANAX) 0.25 MG tablet Take 1 tablet by mouth 2 (two) times daily as needed for anxiety. 40 tablet 1   No facility-administered medications prior to visit.    Allergies  Allergen Reactions   Back Pain-Off [Apap-Mag Salicylate-Caffeine]     Review of Systems  Constitutional:  Negative for chills, fever and malaise/fatigue.  HENT:  Negative for congestion and hearing loss.   Eyes:  Negative for  blurred vision and discharge.  Respiratory:  Negative for cough, sputum production and shortness of breath.   Cardiovascular:  Negative for chest pain, palpitations and leg swelling.  Gastrointestinal:  Negative for abdominal pain, blood in stool, constipation, diarrhea, heartburn, nausea and vomiting.  Genitourinary:  Negative for dysuria, frequency, hematuria and urgency.  Musculoskeletal:  Positive for back pain, joint pain and myalgias. Negative for falls.  Skin:  Positive for rash.  Neurological:  Positive for tingling. Negative for dizziness, sensory change, loss of consciousness, weakness and headaches.  Endo/Heme/Allergies:  Negative for environmental allergies. Does not bruise/bleed easily.  Psychiatric/Behavioral:  Negative for depression and suicidal ideas. The patient is nervous/anxious. The patient does not have insomnia.        Objective:    Physical Exam Constitutional:      General: She is not in acute distress.    Appearance: She is well-developed.  HENT:     Head: Normocephalic and atraumatic.  Eyes:     Conjunctiva/sclera: Conjunctivae normal.  Neck:     Thyroid: No thyromegaly.  Cardiovascular:     Rate and Rhythm: Normal rate and regular rhythm.     Heart sounds: Normal heart sounds. No murmur heard. Pulmonary:     Effort: Pulmonary effort is normal. No respiratory distress.     Breath sounds: Normal breath sounds.  Abdominal:     General: Bowel sounds are normal. There is no distension.     Palpations: Abdomen is soft. There is no mass.     Tenderness: There is no abdominal tenderness.  Musculoskeletal:     Cervical back: Neck supple.  Lymphadenopathy:     Cervical: No cervical adenopathy.  Skin:    General: Skin is warm and dry.     Findings: Rash present.     Comments: Scattered small, macular red spots around bilateral ankles  Neurological:     Mental Status: She is alert and oriented to person, place, and time.  Psychiatric:        Behavior:  Behavior normal.     BP 124/80 (BP Location: Right Arm, Patient Position: Sitting, Cuff Size: Normal)   Pulse 82   Resp 20   Ht '5\' 3"'$  (1.6 m)   Wt 163 lb (73.9 kg)   SpO2 98%   BMI 28.87 kg/m  Wt Readings from Last 3 Encounters:  06/05/22 163 lb (73.9 kg)  04/13/22 163 lb (73.9 kg)  12/28/21 161 lb 9.6 oz (73.3 kg)    Diabetic Foot Exam - Simple   No data filed    Lab Results  Component Value Date   WBC 4.0 06/01/2021   HGB 14.0 06/01/2021   HCT 41.3 06/01/2021   PLT 289.0 06/01/2021   GLUCOSE 89 06/01/2021   CHOL 198 06/01/2021   TRIG 259.0 (H) 06/01/2021   HDL 52.30 06/01/2021   LDLDIRECT 112.0 06/01/2021   LDLCALC 154 (H) 09/19/2018   ALT 21 06/01/2021   AST 21 06/01/2021   NA 140 06/01/2021   K 4.4 06/01/2021   CL 105 06/01/2021   CREATININE 0.69 06/01/2021   BUN 12 06/01/2021   CO2 27 06/01/2021   TSH 3.40 06/01/2021   HGBA1C 5.6 06/01/2021    Lab Results  Component Value Date   TSH 3.40 06/01/2021   Lab Results  Component Value Date   WBC 4.0 06/01/2021   HGB 14.0 06/01/2021   HCT 41.3 06/01/2021   MCV 91.9 06/01/2021   PLT 289.0 06/01/2021   Lab Results  Component Value Date   NA 140 06/01/2021  K 4.4 06/01/2021   CO2 27 06/01/2021   GLUCOSE 89 06/01/2021   BUN 12 06/01/2021   CREATININE 0.69 06/01/2021   BILITOT 0.5 06/01/2021   ALKPHOS 66 06/01/2021   AST 21 06/01/2021   ALT 21 06/01/2021   PROT 7.5 06/01/2021   ALBUMIN 4.5 06/01/2021   CALCIUM 9.5 06/01/2021   GFR 99.89 06/01/2021   Lab Results  Component Value Date   CHOL 198 06/01/2021   Lab Results  Component Value Date   HDL 52.30 06/01/2021   Lab Results  Component Value Date   LDLCALC 154 (H) 09/19/2018   Lab Results  Component Value Date   TRIG 259.0 (H) 06/01/2021   Lab Results  Component Value Date   CHOLHDL 4 06/01/2021   Lab Results  Component Value Date   HGBA1C 5.6 06/01/2021       Assessment & Plan:   COLONOSCOPY: 06/17/20 MAMMOGRAM:  05/02/22 PAP: 05/30/20 PSA: n/a DEXA: n/a   Problem List Items Addressed This Visit     Hyperlipidemia, mixed    Tolerating statin, encouraged heart healthy diet, avoid trans fats, minimize simple carbs and saturated fats. Increase exercise as tolerated      Relevant Orders   Lipid panel   Anxiety and depression    Has a counselor in [place] and is managing well at this time. 478 Breathing Technique. Is having some trouble with her 53 year old. Hoping it will improve.       Relevant Medications   ALPRAZolam (XANAX) 0.25 MG tablet   Dermatitis    Tolerating Dupixent but hs noted some small red spots around her ankles that are otherwise not symptomatic and not worsening. She will discuss with dermatology      Preventative health care - Primary    Patient encouraged to maintain heart healthy diet, regular exercise, adequate sleep. Consider daily probiotics. Take medications as prescribed. Labs ordered and reviewed. Last colonoscopy 6/2021repeat in 2024 July 9 3 years. appt for 2nd shingles. Pap due 2024 or 2025. Seeing dermatology in August      Relevant Orders   TSH   Comprehensive metabolic panel   Anemia   Relevant Orders   CBC   Osteopenia    Repeat Dexa scan ordered      Relevant Orders   VITAMIN D 25 Hydroxy (Vit-D Deficiency, Fractures)   DG Bone Density   Hyperglycemia    hgba1c acceptable, minimize simple carbs. Increase exercise as tolerated.       Relevant Orders   Hemoglobin A1c   Low vitamin D level    Supplement and monitor      Relevant Orders   VITAMIN D 25 Hydroxy (Vit-D Deficiency, Fractures)   Foot pain, bilateral    Follows with podiatry, Dr Earleen Newport and uses [facility] shoes. Struggles with daily pain consider topical treatments      Musculoskeletal neck pain    With some pain and paresthesias into hands L>R. Is working with PT, proceed with cervical spine xray and consider further work up if indicated      Relevant Orders   DG Cervical  Spine Complete    I am having Tricia Wahlberg. [place] maintain her OVER THE COUNTER MEDICATION, Multiple Vitamin (MULTI-VITAMIN DAILY PO), CALCIUM CITRATE PO, ibuprofen, hydrocortisone cream, Vitamin D, olopatadine, albuterol, clindamycin, EpiCeram, famotidine, fexofenadine, montelukast, fluticasone, Dupixent, venlafaxine XR, atorvastatin, and ALPRAZolam.  Meds ordered this encounter  Medications   ALPRAZolam (XANAX) 0.25 MG tablet    Sig: Take 1 tablet  by mouth 2 (two) times daily as needed for anxiety.    Dispense:  40 tablet    Refill:  1

## 2022-06-04 NOTE — Addendum Note (Signed)
Addended by: Lynnea Ferrier R on: 06/04/2022 11:43 AM   Modules accepted: Orders

## 2022-06-05 ENCOUNTER — Ambulatory Visit (INDEPENDENT_AMBULATORY_CARE_PROVIDER_SITE_OTHER): Payer: No Typology Code available for payment source | Admitting: Family Medicine

## 2022-06-05 ENCOUNTER — Encounter: Payer: Self-pay | Admitting: Family Medicine

## 2022-06-05 ENCOUNTER — Other Ambulatory Visit (HOSPITAL_COMMUNITY): Payer: Self-pay

## 2022-06-05 ENCOUNTER — Ambulatory Visit (HOSPITAL_BASED_OUTPATIENT_CLINIC_OR_DEPARTMENT_OTHER)
Admission: RE | Admit: 2022-06-05 | Discharge: 2022-06-05 | Disposition: A | Discharge status: Home / Self Care | Payer: No Typology Code available for payment source | Source: Ambulatory Visit | Attending: Family Medicine | Admitting: Family Medicine

## 2022-06-05 VITALS — BP 124/80 | HR 82 | Resp 20 | Ht 63.0 in | Wt 163.0 lb

## 2022-06-05 DIAGNOSIS — D649 Anemia, unspecified: Secondary | ICD-10-CM | POA: Diagnosis not present

## 2022-06-05 DIAGNOSIS — M542 Cervicalgia: Secondary | ICD-10-CM | POA: Insufficient documentation

## 2022-06-05 DIAGNOSIS — F32A Depression, unspecified: Secondary | ICD-10-CM

## 2022-06-05 DIAGNOSIS — Z0000 Encounter for general adult medical examination without abnormal findings: Secondary | ICD-10-CM | POA: Diagnosis not present

## 2022-06-05 DIAGNOSIS — R7989 Other specified abnormal findings of blood chemistry: Secondary | ICD-10-CM

## 2022-06-05 DIAGNOSIS — E782 Mixed hyperlipidemia: Secondary | ICD-10-CM

## 2022-06-05 DIAGNOSIS — M79672 Pain in left foot: Secondary | ICD-10-CM

## 2022-06-05 DIAGNOSIS — F419 Anxiety disorder, unspecified: Secondary | ICD-10-CM

## 2022-06-05 DIAGNOSIS — M8580 Other specified disorders of bone density and structure, unspecified site: Secondary | ICD-10-CM

## 2022-06-05 DIAGNOSIS — R739 Hyperglycemia, unspecified: Secondary | ICD-10-CM

## 2022-06-05 DIAGNOSIS — M79671 Pain in right foot: Secondary | ICD-10-CM

## 2022-06-05 DIAGNOSIS — L309 Dermatitis, unspecified: Secondary | ICD-10-CM

## 2022-06-05 LAB — COMPREHENSIVE METABOLIC PANEL
ALT: 18 U/L (ref 0–35)
AST: 18 U/L (ref 0–37)
Albumin: 4.4 g/dL (ref 3.5–5.2)
Alkaline Phosphatase: 70 U/L (ref 39–117)
BUN: 13 mg/dL (ref 6–23)
CO2: 26 mEq/L (ref 19–32)
Calcium: 9.7 mg/dL (ref 8.4–10.5)
Chloride: 102 mEq/L (ref 96–112)
Creatinine, Ser: 0.67 mg/dL (ref 0.40–1.20)
GFR: 99.89 mL/min (ref >60.00)
Glucose, Bld: 109 mg/dL — ABNORMAL HIGH (ref 70–99)
Potassium: 4.1 mEq/L (ref 3.5–5.1)
Sodium: 138 mEq/L (ref 135–145)
Total Bilirubin: 0.4 mg/dL (ref 0.2–1.2)
Total Protein: 7.3 g/dL (ref 6.0–8.3)

## 2022-06-05 LAB — TSH: TSH: 3.42 u[IU]/mL (ref 0.35–5.50)

## 2022-06-05 LAB — CBC
HCT: 41.5 % (ref 36.0–46.0)
Hemoglobin: 14.2 g/dL (ref 12.0–15.0)
MCHC: 34.1 g/dL (ref 30.0–36.0)
MCV: 91.9 fl (ref 78.0–100.0)
Platelets: 301 10*3/uL (ref 150.0–400.0)
RBC: 4.51 Mil/uL (ref 3.87–5.11)
RDW: 12.6 % (ref 11.5–15.5)
WBC: 5.3 10*3/uL (ref 4.0–10.5)

## 2022-06-05 LAB — LIPID PANEL
Cholesterol: 201 mg/dL — ABNORMAL HIGH (ref 0–200)
HDL: 66 mg/dL (ref >39.00)
NonHDL: 135.26
Total CHOL/HDL Ratio: 3
Triglycerides: 259 mg/dL — ABNORMAL HIGH (ref 0.0–149.0)
VLDL: 51.8 mg/dL — ABNORMAL HIGH (ref 0.0–40.0)

## 2022-06-05 LAB — HEMOGLOBIN A1C: Hgb A1c MFr Bld: 5.8 % (ref 4.6–6.5)

## 2022-06-05 LAB — LDL CHOLESTEROL, DIRECT: Direct LDL: 114 mg/dL

## 2022-06-05 LAB — VITAMIN D 25 HYDROXY (VIT D DEFICIENCY, FRACTURES): VITD: 34.33 ng/mL (ref 30.00–100.00)

## 2022-06-05 MED ORDER — ALPRAZOLAM 0.25 MG PO TABS
0.2500 mg | ORAL_TABLET | Freq: Two times a day (BID) | ORAL | 1 refills | Status: DC | PRN
  Filled 2022-06-05: qty 40, 20d supply, fill #0

## 2022-06-05 NOTE — Assessment & Plan Note (Signed)
Tolerating statin, encouraged heart healthy diet, avoid trans fats, minimize simple carbs and saturated fats. Increase exercise as tolerated 

## 2022-06-05 NOTE — Assessment & Plan Note (Addendum)
Patient encouraged to maintain heart healthy diet, regular exercise, adequate sleep. Consider daily probiotics. Take medications as prescribed. Labs ordered and reviewed. Last colonoscopy 6/2021repeat in 2024 July 9 3 years. appt for 2nd shingles. Pap due 2024 or 2025. Seeing dermatology in August

## 2022-06-05 NOTE — Patient Instructions (Addendum)
CBD Daily extra strength menthol cream   Preventive Care 26-53 Years Old, Female Preventive care refers to lifestyle choices and visits with your health care provider that can promote health and wellness. Preventive care visits are also called wellness exams. What can I expect for my preventive care visit? Counseling Your health care provider may ask you questions about your: Medical history, including: Past medical problems. Family medical history. Pregnancy history. Current health, including: Menstrual cycle. Method of birth control. Emotional well-being. Home life and relationship well-being. Sexual activity and sexual health. Lifestyle, including: Alcohol, nicotine or tobacco, and drug use. Access to firearms. Diet, exercise, and sleep habits. Work and work Statistician. Sunscreen use. Safety issues such as seatbelt and bike helmet use. Physical exam Your health care provider will check your: Height and weight. These may be used to calculate your BMI (body mass index). BMI is a measurement that tells if you are at a healthy weight. Waist circumference. This measures the distance around your waistline. This measurement also tells if you are at a healthy weight and may help predict your risk of certain diseases, such as type 2 diabetes and high blood pressure. Heart rate and blood pressure. Body temperature. Skin for abnormal spots. What immunizations do I need?  Vaccines are usually given at various ages, according to a schedule. Your health care provider will recommend vaccines for you based on your age, medical history, and lifestyle or other factors, such as travel or where you work. What tests do I need? Screening Your health care provider may recommend screening tests for certain conditions. This may include: Lipid and cholesterol levels. Diabetes screening. This is done by checking your blood sugar (glucose) after you have not eaten for a while (fasting). Pelvic exam  and Pap test. Hepatitis B test. Hepatitis C test. HIV (human immunodeficiency virus) test. STI (sexually transmitted infection) testing, if you are at risk. Lung cancer screening. Colorectal cancer screening. Mammogram. Talk with your health care provider about when you should start having regular mammograms. This may depend on whether you have a family history of breast cancer. BRCA-related cancer screening. This may be done if you have a family history of breast, ovarian, tubal, or peritoneal cancers. Bone density scan. This is done to screen for osteoporosis. Talk with your health care provider about your test results, treatment options, and if necessary, the need for more tests. Follow these instructions at home: Eating and drinking  Eat a diet that includes fresh fruits and vegetables, whole grains, lean protein, and low-fat dairy products. Take vitamin and mineral supplements as recommended by your health care provider. Do not drink alcohol if: Your health care provider tells you not to drink. You are pregnant, may be pregnant, or are planning to become pregnant. If you drink alcohol: Limit how much you have to 0-1 drink a day. Know how much alcohol is in your drink. In the [place] one drink equals one 12 oz bottle of beer (355 mL), one 5 oz glass of wine (148 mL), or one 1 oz glass of hard liquor (44 mL). Lifestyle Brush your teeth every morning and night with fluoride toothpaste. Floss one time each day. Exercise for at least 30 minutes 5 or more days each week. Do not use any products that contain nicotine or tobacco. These products include cigarettes, chewing tobacco, and vaping devices, such as e-cigarettes. If you need help quitting, ask your health care provider. Do not use drugs. If you are sexually active, practice safe sex. Use  a condom or other form of protection to prevent STIs. If you do not wish to become pregnant, use a form of birth control. If you plan to become  pregnant, see your health care provider for a prepregnancy visit. Take aspirin only as told by your health care provider. Make sure that you understand how much to take and what form to take. Work with your health care provider to find out whether it is safe and beneficial for you to take aspirin daily. Find healthy ways to manage stress, such as: Meditation, yoga, or listening to music. Journaling. Talking to a trusted person. Spending time with friends and family. Minimize exposure to UV radiation to reduce your risk of skin cancer. Safety Always wear your seat belt while driving or riding in a vehicle. Do not drive: If you have been drinking alcohol. Do not ride with someone who has been drinking. When you are tired or distracted. While texting. If you have been using any mind-altering substances or drugs. Wear a helmet and other protective equipment during sports activities. If you have firearms in your house, make sure you follow all gun safety procedures. Seek help if you have been physically or sexually abused. What's next? Visit your health care provider once a year for an annual wellness visit. Ask your health care provider how often you should have your eyes and teeth checked. Stay up to date on all vaccines. This information is not intended to replace advice given to you by your health care provider. Make sure you discuss any questions you have with your health care provider. Document Revised: 06/07/2021 Document Reviewed: 06/07/2021 Elsevier Patient Education  [place].

## 2022-06-05 NOTE — Assessment & Plan Note (Signed)
With some pain and paresthesias into hands L>R. Is working with PT, proceed with cervical spine xray and consider further work up if indicated

## 2022-06-05 NOTE — Assessment & Plan Note (Signed)
Supplement and monitor 

## 2022-06-05 NOTE — Assessment & Plan Note (Addendum)
Tolerating Dupixent but hs noted some small red spots around her ankles that are otherwise not symptomatic and not worsening. She will discuss with dermatology

## 2022-06-05 NOTE — Assessment & Plan Note (Addendum)
Has a counselor in [employer] and is managing well at this time. [employer]. Is having some trouble with her 51 year old. Hoping it will improve.

## 2022-06-05 NOTE — Assessment & Plan Note (Addendum)
Follows with podiatry, Dr Earleen Newport and uses [facility] shoes. Struggles with daily pain consider topical treatments

## 2022-06-05 NOTE — Assessment & Plan Note (Signed)
hgba1c acceptable, minimize simple carbs. Increase exercise as tolerated.  

## 2022-06-05 NOTE — Assessment & Plan Note (Signed)
Repeat Dexa scan ordered.  

## 2022-06-06 ENCOUNTER — Other Ambulatory Visit (HOSPITAL_COMMUNITY): Payer: Self-pay

## 2022-06-06 ENCOUNTER — Other Ambulatory Visit: Payer: Self-pay

## 2022-06-06 MED ORDER — ATORVASTATIN CALCIUM 20 MG PO TABS
20.0000 mg | ORAL_TABLET | Freq: Every evening | ORAL | 1 refills | Status: DC
  Filled 2022-06-06: qty 90, 90d supply, fill #0
  Filled 2022-12-02: qty 90, 90d supply, fill #1

## 2022-06-08 ENCOUNTER — Ambulatory Visit: Payer: No Typology Code available for payment source | Admitting: Physical Therapy

## 2022-06-08 ENCOUNTER — Encounter: Payer: Self-pay | Admitting: Physical Therapy

## 2022-06-08 DIAGNOSIS — M5459 Other low back pain: Secondary | ICD-10-CM

## 2022-06-08 DIAGNOSIS — M542 Cervicalgia: Secondary | ICD-10-CM | POA: Diagnosis not present

## 2022-06-08 DIAGNOSIS — M6281 Muscle weakness (generalized): Secondary | ICD-10-CM | POA: Diagnosis not present

## 2022-06-08 DIAGNOSIS — R262 Difficulty in walking, not elsewhere classified: Secondary | ICD-10-CM

## 2022-06-08 DIAGNOSIS — R293 Abnormal posture: Secondary | ICD-10-CM | POA: Diagnosis not present

## 2022-06-08 NOTE — Therapy (Signed)
OUTPATIENT PHYSICAL THERAPY TREATMENT NOTE   Patient Name: ELARA COCKE MRN: [id number] DOB:16-Jul-1971, 51 y.o., female Today's Date: 06/08/2022  END OF SESSION:   PT End of Session - 06/08/22 0812     Visit Number 3    Number of Visits 20    Date for PT Re-Evaluation 08/01/22    Authorization Type [place] $40 copay    PT Start Time 0802    PT Stop Time 0845    PT Time Calculation (min) 43 min    Activity Tolerance Patient tolerated treatment well    Behavior During Therapy [facility] for tasks assessed/performed             Past Medical History:  Diagnosis Date   Allergy    Anemia 06/18/2014   Anxiety    Anxiety and depression 02/17/2009   Qualifier: Diagnosis of  By: Redmond Pulling MD, LauraLee     Asthma    Broken ankle 2011   (left) roller skating   Bronchitis, mucopurulent recurrent ([place]) 04/21/2013   Cervical cancer screening 02/20/2016   Depression    GERD (gastroesophageal reflux disease)    Hemorrhoid    History of hidradenitis suppurativa    Hyperlipemia    IBS (irritable bowel syndrome)    Kidney infection    as a child   Low back pain 05/25/2016   Osteopenia    PCO (polycystic ovaries)    Pruritus 08/22/2017   Past Surgical History:  Procedure Laterality Date   ANKLE SURGERY     plate and 8 screws in left ankle   HEMORRHOID SURGERY     Patient Active Problem List   Diagnosis Date Noted   Foot pain, bilateral 06/05/2022   Musculoskeletal neck pain 06/05/2022   Post-menopausal bleeding 04/15/2022   Pelvic pain 04/13/2022   Epiretinal membrane 04/20/2020   Low vitamin D level 04/01/2020   Hyperglycemia 02/14/2020   Osteopenia 11/02/2019   Acid reflux 11/02/2019   Perimenopause 04/06/2019   Low back pain 05/25/2016   Cervical cancer screening 02/20/2016   Anemia 06/18/2014   Preventative health care 04/23/2013   Asthma 01/22/2011   Hyperlipidemia, mixed 02/17/2009   Anxiety and depression 02/17/2009   HEMORRHOIDS 02/17/2009   Allergic rhinitis  02/17/2009   Bronchitis with asthma, subacute 02/17/2009   IRRITABLE BOWEL SYNDROME 02/17/2009   Dermatitis 02/17/2009    THERAPY DIAG:  Other low back pain  Cervicalgia  Abnormal posture  Muscle weakness (generalized)  Difficulty in walking, not elsewhere classified  PCP: Mosie Lukes, MD   REFERRING PROVIDER: Mosie Lukes, MD   REFERRING DIAG: M54.50 (ICD-10-CM) - Low back pain, unspecified back pain laterality, unspecified chronicity, unspecified whether sciatica present   Rationale for Evaluation and Treatment Rehabilitation   ONSET DATE: At least a year 04/23/2021   SUBJECTIVE:  SUBJECTIVE STATEMENT: Pt states more overall back pain after working all week and taking dog for a long walk. 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)   PLOF: Independent, walking, yard work   PATIENT GOALS Reduce pain, improve flexibility     OBJECTIVE:    PATIENT SURVEYS:  05/23/2022: FOTO intake:  70  predicted: 74           MUSCLE LENGTH: 05/23/2022 Hamstrings( supine passive SLR ): Right 90 deg; Left 90 deg     POSTURE:  05/23/2022 : Rounded shoulders, mild forward head posture in sitting/standing   PALPATION: 05/23/2022    CERVICAL ROM:  05/23/2022:  not tested today due to time   LUMBAR ROM:             05/23/2022: no centralization or peripheralization noted in lumbar movements AROM AROM  Eval 05/23/2022   Flexion Movement to toes, no back pain  Extension 50% c lumbar pain in end range   Repeated x 5 in standing:  improvement 75%    Right lateral flexion Lateral femoral epicondyle c no complaints  Left lateral flexion Lateral femoral epicondyle c low back pain  Right rotation    Left rotation     (Blank rows = not tested)   LOWER EXTREMITY ROM:      Active  Right eval Left eval  Hip flexion      Hip extension      Hip abduction      Hip adduction      Hip internal rotation      Hip external rotation      Knee flexion      Knee extension      Ankle dorsiflexion      Ankle plantarflexion      Ankle inversion      Ankle eversion       (Blank rows = not tested)   LOWER EXTREMITY MMT:     MMT Right eval Left eval  Hip flexion 5/5 5/5  Hip extension 5/5 5/5  Hip abduction 4/5 4+/5  Hip adduction      Hip internal rotation      Hip external rotation      Knee flexion 5/5 5/5  Knee extension 5/5 5/5  Ankle dorsiflexion 5/5 5/5  Ankle plantarflexion      Ankle inversion      Ankle eversion       (Blank rows = not tested)   LUMBAR SPECIAL TESTS:  05/23/2022 :      Lt slump: (-)      Rt slump: (-)                         Crossed SLR Lt: (-)     Rt: (-)   GAIT: 05/23/2022 Independent, unremarkable     TODAY'S TREATMENT  06/08/22 Nu step X 5 min L5 UE/LE Standing Rows with red X 20 Standing shoulder extensions red X 20 Standing hip hike X 15 bilat Standing hip extension X 15 bilat Standing lumbar extensions X 15 holding 5 sec Standing lumbar L stretch at counter 10 sec X  5 Doorway stretch 10 sec X 5 low and X 5 mid Standing cervical retractions 5 sec X 15  Mechanical cervical traction 18-13# intermittent X 15 minutes  05/31/22 Nu step X 5 min L5 UE/LE Supine LTR 10 sec X 5 bilat Supine SKTC stretch 30 sec X 3 bilat Supine piriformis stretch 30 sec X 3 bilat Supine hip flexor stretch 30 sec X3 Supine bridges 5 sec X 15 Sidelying hip abduction X 15  bilat Seated on pball rows and extensions with green X 20 Seated on pball marches X 10 bilat Standing hip hike X 15 bilat Standing lumbar extensions X 15 holding 5 sec Doorway stretch low 10 sec X 10 Standing cervical retractions 5 sec X 15   PATIENT EDUCATION:  Education details: HEP, POC Person educated: Patient Education method: Consulting civil engineer, Media planner, Verbal cues, and Handouts Education comprehension: verbalized understanding, returned demonstration, and verbal cues required     HOME EXERCISE PROGRAM: Access Code: [email or web address] URL: [email or web address] Date: 05/23/2022 Prepared by: Scot Jun   Exercises - Supine Lower Trunk Rotation  - 2-3 x daily - 7 x weekly - 1 sets - 3-5 reps - 15 hold - Standing Lumbar Extension with Counter  - 3-5 x daily - 7 x weekly - 1 sets - 5-10 reps - Sidelying Hip Abduction  - 1-2 x daily - 7 x weekly - 2-3 sets - 10-15 reps - Supine Bridge  - 1-2 x daily - 7 x weekly - 2-3 sets - 10 reps - 2 hold - Standing Hip Hiking  - 1-2 x daily - 7 x weekly - 1 sets - 10 reps - 5 hold - Supine Piriformis Stretch  - 1-2 x daily - 7 x weekly - 1 sets - 5 reps - 30 hold - Seated Scapular Retraction  - 3-5 x daily - 7 x weekly - 1 sets - 5 reps - 3-5 hold   ASSESSMENT:   CLINICAL IMPRESSION: She has yet to have much overall relief with cervical radiculopathy, I did trial cervical mechanical traction to see if this gives her any relief.      OBJECTIVE IMPAIRMENTS decreased activity tolerance, decreased coordination, decreased mobility, difficulty walking, decreased ROM, decreased strength, hypomobility, increased fascial restrictions, impaired perceived functional ability, impaired flexibility, impaired UE functional use, improper body mechanics, postural dysfunction, and pain.    ACTIVITY LIMITATIONS carrying, lifting, bending, sitting, standing, sleeping, and locomotion level   PARTICIPATION LIMITATIONS: cleaning, laundry, driving,  community activity, and yard work   PERSONAL FACTORS Hyperlipidemia, depression, IBS, osteopenia are also affecting patient's functional outcome.    REHAB POTENTIAL: Good   CLINICAL DECISION MAKING: Stable/uncomplicated   EVALUATION COMPLEXITY: Low     GOALS: Goals reviewed with patient? Yes   Short term PT Goals (target date for Short term goals are 3 weeks 06/13/2022) Patient will demonstrate independent use of home exercise program to maintain progress from in clinic treatments. Goal status: New   Long term PT goals (target dates for all long term goals are 10 weeks  08/01/2022 )   1. Patient will demonstrate/report pain at worst less than or equal to 2/10 to facilitate minimal limitation in daily activity secondary to pain symptoms. Goal status: New   2. Patient will demonstrate independent use of home exercise program to facilitate ability to maintain/progress functional gains from skilled physical therapy services. Goal status: New   3. Patient will demonstrate FOTO outcome > or = 74 % to indicate reduced disability due to condition. Goal status: New  4.  Patient will demonstrate lumbar AROM extension 100 % WFL, lateral flexion to knee jt s symptoms bilateral to facilitate usual mobility in lumbar region.  Goal status: New   5.  Patient will demonstrate bilateral hip MMT 5/5 throughout to facilitate strength and stability in daily activity.    Goal status: New   6.  Patient will demonstrate/report ability to sleep s restriction due to symptoms.   Goal status: New     PLAN: PT FREQUENCY: 1-2x/week   PT DURATION: 10 weeks   PLANNED INTERVENTIONS: Therapeutic exercises, Therapeutic activity, Neuro Muscular re-education, Balance training, Gait training, Patient/Family education, Joint mobilization, Stair training, DME instructions, Dry Needling, Electrical stimulation, Cryotherapy, Moist heat, Taping, Ultrasound, Ionotophoresis '4mg'$ /ml Dexamethasone, and Manual therapy.   All included unless contraindicated.   PLAN FOR NEXT SESSION: how was traction?    Debbe Odea, PT,DPT 06/08/2022, 8:12 AM

## 2022-06-15 ENCOUNTER — Encounter: Payer: Self-pay | Admitting: Rehabilitative and Restorative Service Providers"

## 2022-06-15 ENCOUNTER — Ambulatory Visit: Payer: No Typology Code available for payment source | Admitting: Rehabilitative and Restorative Service Providers"

## 2022-06-15 DIAGNOSIS — M542 Cervicalgia: Secondary | ICD-10-CM

## 2022-06-15 DIAGNOSIS — R293 Abnormal posture: Secondary | ICD-10-CM | POA: Diagnosis not present

## 2022-06-15 DIAGNOSIS — M5459 Other low back pain: Secondary | ICD-10-CM

## 2022-06-15 DIAGNOSIS — M6281 Muscle weakness (generalized): Secondary | ICD-10-CM | POA: Diagnosis not present

## 2022-06-15 DIAGNOSIS — R262 Difficulty in walking, not elsewhere classified: Secondary | ICD-10-CM

## 2022-06-20 ENCOUNTER — Other Ambulatory Visit (HOSPITAL_COMMUNITY): Payer: Self-pay

## 2022-06-21 ENCOUNTER — Encounter: Payer: Self-pay | Admitting: Physical Therapy

## 2022-06-21 ENCOUNTER — Ambulatory Visit: Payer: No Typology Code available for payment source | Admitting: Physical Therapy

## 2022-06-21 DIAGNOSIS — R293 Abnormal posture: Secondary | ICD-10-CM | POA: Diagnosis not present

## 2022-06-21 DIAGNOSIS — M5459 Other low back pain: Secondary | ICD-10-CM | POA: Diagnosis not present

## 2022-06-21 DIAGNOSIS — M6281 Muscle weakness (generalized): Secondary | ICD-10-CM

## 2022-06-21 DIAGNOSIS — M542 Cervicalgia: Secondary | ICD-10-CM

## 2022-06-21 DIAGNOSIS — R262 Difficulty in walking, not elsewhere classified: Secondary | ICD-10-CM

## 2022-06-21 NOTE — Therapy (Addendum)
OUTPATIENT PHYSICAL THERAPY TREATMENT NOTE /DISCHARGE   Patient Name: Tricia Potts MRN: [id number] DOB:1971/03/18, 51 y.o., female Today's Date: 06/21/2022  END OF SESSION:   PT End of Session - 06/21/22 0827     Visit Number 5    Number of Visits 20    Date for PT Re-Evaluation 08/01/22    Authorization Type [employer] $40 copay    PT Start Time 0800    PT Stop Time 0845    PT Time Calculation (min) 45 min    Activity Tolerance Patient tolerated treatment well    Behavior During Therapy [facility] for tasks assessed/performed              Past Medical History:  Diagnosis Date   Allergy    Anemia 06/18/2014   Anxiety    Anxiety and depression 02/17/2009   Qualifier: Diagnosis of  By: Redmond Pulling MD, LauraLee     Asthma    Broken ankle 2011   (left) roller skating   Bronchitis, mucopurulent recurrent ([place]) 04/21/2013   Cervical cancer screening 02/20/2016   Depression    GERD (gastroesophageal reflux disease)    Hemorrhoid    History of hidradenitis suppurativa    Hyperlipemia    IBS (irritable bowel syndrome)    Kidney infection    as a child   Low back pain 05/25/2016   Osteopenia    PCO (polycystic ovaries)    Pruritus 08/22/2017   Past Surgical History:  Procedure Laterality Date   ANKLE SURGERY     plate and 8 screws in left ankle   HEMORRHOID SURGERY     Patient Active Problem List   Diagnosis Date Noted   Foot pain, bilateral 06/05/2022   Musculoskeletal neck pain 06/05/2022   Post-menopausal bleeding 04/15/2022   Pelvic pain 04/13/2022   Epiretinal membrane 04/20/2020   Low vitamin D level 04/01/2020   Hyperglycemia 02/14/2020   Osteopenia 11/02/2019   Acid reflux 11/02/2019   Perimenopause 04/06/2019   Low back pain 05/25/2016   Cervical cancer screening 02/20/2016   Anemia 06/18/2014   Preventative health care 04/23/2013   Asthma 01/22/2011   Hyperlipidemia, mixed 02/17/2009   Anxiety and depression 02/17/2009   HEMORRHOIDS 02/17/2009    Allergic rhinitis 02/17/2009   Bronchitis with asthma, subacute 02/17/2009   IRRITABLE BOWEL SYNDROME 02/17/2009   Dermatitis 02/17/2009    THERAPY DIAG:  Other low back pain  Cervicalgia  Abnormal posture  Muscle weakness (generalized)  Difficulty in walking, not elsewhere classified  PCP: Mosie Lukes, MD   REFERRING PROVIDER: Mosie Lukes, MD   REFERRING DIAG: M54.50 (ICD-10-CM) - Low back pain, unspecified back pain laterality, unspecified chronicity, unspecified whether sciatica present   Rationale for Evaluation and Treatment Rehabilitation   ONSET DATE: At least a year 04/23/2021   SUBJECTIVE:  SUBJECTIVE STATEMENT: Pt indicated back pain was better after DN. No change from cervical traction in the N/T in her fingers.    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)   PLOF: Independent, walking, yard work   PATIENT GOALS Reduce pain, improve flexibility     OBJECTIVE:    PATIENT SURVEYS:  06/15/2022:  FOTO update:  70  05/23/2022: FOTO intake:  70  predicted: 74           MUSCLE LENGTH: 05/23/2022 Hamstrings( supine passive SLR ): Right 90 deg; Left 90 deg    POSTURE:  05/23/2022 : Rounded shoulders, mild forward head posture in sitting/standing   PALPATION: 05/23/2022    CERVICAL ROM:  05/23/2022:  not tested today due to time   LUMBAR ROM:             05/23/2022: no centralization or peripheralization noted in lumbar  movements AROM AROM  Eval 05/23/2022 AROM 06/15/2022  Flexion Movement to toes, no back pain   Extension 50% c lumbar pain in end range   Repeated x 5 in standing:  improvement 75%   75% c no complaints (performed after dry needling)  Right lateral flexion Lateral femoral epicondyle c no complaints   Left lateral flexion Lateral femoral epicondyle c low back pain   Right rotation     Left rotation      (Blank rows = not tested)   LOWER EXTREMITY ROM:      Active  Right eval Left eval  Hip flexion      Hip extension      Hip abduction      Hip adduction      Hip internal rotation      Hip external rotation      Knee flexion      Knee extension      Ankle dorsiflexion      Ankle plantarflexion      Ankle inversion      Ankle eversion       (Blank rows = not tested)   LOWER EXTREMITY MMT:     MMT Right eval Left eval  Hip flexion 5/5 5/5  Hip extension 5/5 5/5  Hip abduction 4/5 4+/5  Hip adduction      Hip internal rotation      Hip external rotation      Knee flexion 5/5 5/5  Knee extension 5/5 5/5  Ankle dorsiflexion 5/5 5/5  Ankle plantarflexion      Ankle inversion      Ankle eversion       (Blank rows = not tested)   LUMBAR SPECIAL TESTS:  05/23/2022 :      Lt slump: (-)      Rt slump: (-)                         Crossed SLR Lt: (-)     Rt: (-)   GAIT: 05/23/2022 Independent, unremarkable     TODAY'S TREATMENT  06/21/2022: Therex:  -Seated thoracic extensions with hands behind head X 10, caused N/T in left arm, modified to arms across chest and did not have N/T X10  -  Bilateral ER with green band X 10 caused N/T  -Protraction casused N/T  -first rib self mobilization with strap caused N/T  -Upper trap stretch and levator AROM 5 sec X 10 each side was able to perform without N/T but if she uses her other hand on top off head for extra pull causes N/T.   Manual:  Compression to Lt lumbar paraspinals and cervical paraspinals. Cervical central PA mobs  C2-T2 grade 3.  Skilled palpation c dry needling Dry Needling  Twitch response noted from Rt lumbar paraspinals L3-L5, multifidi c no adverse reaction.   06/15/2022: Therex:  Nustep Lvl 6 8 mins   Supine lumbar trunk rotation 15 sec x 3 bilateral  Supine bridge 5 sec hold x 15  Lumbar extension x 10   Manual:  Compression to Lt lumbar paraspinals.  Skilled palpation c dry needling Dry Needling  Twitch response noted from Lt lumbar paraspinals L3-L5, multifidi c no adverse reaction.  Concordant symptoms indicated during procedure  Cervical Traction intermittent 16 lbs/11 lbs  60 sec /20 sec (adjusted pull based off Pt report) 15 mins  06/08/22 Nu step X 5 min L5 UE/LE Standing Rows with red X 20 Standing shoulder extensions red X 20 Standing hip hike X 15 bilat Standing hip extension X 15 bilat Standing lumbar extensions X 15 holding 5 sec Standing lumbar L stretch at counter 10 sec X 5 Doorway stretch 10 sec X 5 low and X 5 mid Standing cervical retractions 5 sec X 15  Mechanical cervical traction 18-13# intermittent X 15 minutes  PATIENT EDUCATION:  Education details: DN Person educated: Patient Education method: Consulting civil engineer, Media planner, Verbal cues, and Handouts Education comprehension: verbalized understanding, returned demonstration, and verbal cues required     HOME EXERCISE PROGRAM: Access Code: [email or web address] URL: [email or web address] Date: 05/23/2022 Prepared by: Scot Jun   Exercises - Supine Lower Trunk Rotation  - 2-3 x daily - 7 x weekly - 1 sets - 3-5 reps - 15 hold - Standing Lumbar Extension with Counter  - 3-5 x daily - 7 x weekly - 1 sets - 5-10 reps - Sidelying Hip Abduction  - 1-2 x daily - 7 x weekly - 2-3 sets - 10-15 reps - Supine Bridge  - 1-2 x daily - 7 x weekly - 2-3 sets - 10 reps - 2 hold - Standing Hip Hiking  - 1-2 x daily - 7 x weekly - 1 sets - 10 reps - 5 hold - Supine Piriformis Stretch  - 1-2 x daily - 7 x weekly - 1  sets - 5 reps - 30 hold - Seated Scapular Retraction  - 3-5 x daily - 7 x weekly - 1 sets - 5 reps - 3-5 hold   ASSESSMENT:   CLINICAL IMPRESSION: Pt continues to get N/T in her arms left worse than Rt with any movement raising arms overhead, or into ER or horizontal abduction that may be consistent with thoracic outlet syndrome. I did give her 3 stretches to try for this to see if this helps. DN again performed to back but also to neck today. DN and cervical mobs did not improve her N/T though. If she does not get any additional benefit from thoracic outlet stretches she may need to be referred back to MD for more testing and treatment.     OBJECTIVE IMPAIRMENTS decreased activity tolerance, decreased coordination, decreased mobility, difficulty walking, decreased ROM, decreased strength, hypomobility, increased fascial restrictions, impaired perceived functional ability, impaired flexibility, impaired UE functional  use, improper body mechanics, postural dysfunction, and pain.    ACTIVITY LIMITATIONS carrying, lifting, bending, sitting, standing, sleeping, and locomotion level   PARTICIPATION LIMITATIONS: cleaning, laundry, driving, community activity, and yard work   PERSONAL FACTORS Hyperlipidemia, depression, IBS, osteopenia are also affecting patient's functional outcome.    REHAB POTENTIAL: Good   CLINICAL DECISION MAKING: Stable/uncomplicated   EVALUATION COMPLEXITY: Low     GOALS: Goals reviewed with patient? Yes   Short term PT Goals (target date for Short term goals are 3 weeks 06/13/2022) Patient will demonstrate independent use of home exercise program to maintain progress from in clinic treatments. Goal status: MET   Long term PT goals (target dates for all long term goals are 10 weeks  08/01/2022 )   1. Patient will demonstrate/report pain at worst less than or equal to 2/10 to facilitate minimal limitation in daily activity secondary to pain symptoms. Goal status: on  going - assessed 06/15/2022   2. Patient will demonstrate independent use of home exercise program to facilitate ability to maintain/progress functional gains from skilled physical therapy services. Goal status: on going - assessed 06/15/2022   3. Patient will demonstrate FOTO outcome > or = 74 % to indicate reduced disability due to condition. Goal status: on going - assessed 06/15/2022   4.  Patient will demonstrate lumbar AROM extension 100 % WFL, lateral flexion to knee jt s symptoms bilateral to facilitate usual mobility in lumbar region.  Goal status: on going - assessed 06/15/2022   5.  Patient will demonstrate bilateral hip MMT 5/5 throughout to facilitate strength and stability in daily activity.    Goal status: on going - assessed 06/15/2022   6.  Patient will demonstrate/report ability to sleep s restriction due to symptoms.   Goal status: on going - assessed 06/15/2022     PLAN: PT FREQUENCY: 1-2x/week   PT DURATION: 10 weeks   PLANNED INTERVENTIONS: Therapeutic exercises, Therapeutic activity, Neuro Muscular re-education, Balance training, Gait training, Patient/Family education, Joint mobilization, Stair training, DME instructions, Dry Needling, Electrical stimulation, Cryotherapy, Moist heat, Taping, Ultrasound, Ionotophoresis 42m/ml Dexamethasone, and Manual therapy.  All included unless contraindicated.   PLAN FOR NEXT SESSION: Dry needling if desired, check how thoracic outlet exercises were going and if no improvement may need to refer back to MD   BElsie Ra PT, DPT 06/21/22 9:18 AM  PHYSICAL THERAPY DISCHARGE SUMMARY  Visits from Start of Care: HEP  Current functional level related to goals / functional outcomes: See note   Remaining deficits: See note   Education / Equipment: HEP   Patient goals were  partially met . Patient is being discharged due to not returning since the last visit.  MScot Jun PT, DPT, OCS, ATC 08/06/22  8:33  AM

## 2022-06-25 ENCOUNTER — Encounter: Payer: Self-pay | Admitting: Family Medicine

## 2022-06-27 ENCOUNTER — Other Ambulatory Visit: Payer: Self-pay | Admitting: Family Medicine

## 2022-06-27 DIAGNOSIS — M542 Cervicalgia: Secondary | ICD-10-CM

## 2022-06-28 ENCOUNTER — Telehealth (HOSPITAL_BASED_OUTPATIENT_CLINIC_OR_DEPARTMENT_OTHER): Payer: Self-pay

## 2022-06-28 ENCOUNTER — Other Ambulatory Visit (HOSPITAL_COMMUNITY): Payer: Self-pay

## 2022-06-28 ENCOUNTER — Encounter: Payer: No Typology Code available for payment source | Admitting: Rehabilitative and Restorative Service Providers"

## 2022-06-28 MED ORDER — FEXOFENADINE HCL 180 MG PO TABS
ORAL_TABLET | Freq: Every day | ORAL | 1 refills | Status: DC
  Filled 2022-06-28: qty 90, 90d supply, fill #0
  Filled 2022-07-25: qty 90, 90d supply, fill #1

## 2022-06-29 ENCOUNTER — Ambulatory Visit (INDEPENDENT_AMBULATORY_CARE_PROVIDER_SITE_OTHER): Payer: No Typology Code available for payment source

## 2022-06-29 ENCOUNTER — Other Ambulatory Visit (HOSPITAL_COMMUNITY): Payer: Self-pay

## 2022-06-29 DIAGNOSIS — Z23 Encounter for immunization: Secondary | ICD-10-CM

## 2022-06-29 DIAGNOSIS — L309 Dermatitis, unspecified: Secondary | ICD-10-CM

## 2022-06-29 MED ORDER — CRISABOROLE 2 % EX OINT
TOPICAL_OINTMENT | CUTANEOUS | 3 refills | Status: DC
  Filled 2022-06-29: qty 100, 30d supply, fill #0

## 2022-06-29 NOTE — Telephone Encounter (Signed)
Pt was in today for NV. She stated she declines the thoracic.

## 2022-06-29 NOTE — Progress Notes (Signed)
Tricia Potts is a 51 y.o. female presents to the office today for her second shingles vaccine.  Shingrix vaccine given in right deltoid. Patient tolerated injection well.   Ninfa Meeker , CMA

## 2022-06-30 ENCOUNTER — Ambulatory Visit (HOSPITAL_BASED_OUTPATIENT_CLINIC_OR_DEPARTMENT_OTHER)
Admission: RE | Admit: 2022-06-30 | Discharge: 2022-06-30 | Disposition: A | Discharge status: Home / Self Care | Payer: No Typology Code available for payment source | Source: Ambulatory Visit | Attending: Family Medicine | Admitting: Family Medicine

## 2022-06-30 DIAGNOSIS — M542 Cervicalgia: Secondary | ICD-10-CM | POA: Diagnosis not present

## 2022-07-02 ENCOUNTER — Other Ambulatory Visit (HOSPITAL_COMMUNITY): Payer: Self-pay

## 2022-07-02 ENCOUNTER — Encounter: Payer: Self-pay | Admitting: Family Medicine

## 2022-07-03 ENCOUNTER — Other Ambulatory Visit: Payer: Self-pay

## 2022-07-03 ENCOUNTER — Encounter: Payer: No Typology Code available for payment source | Admitting: Rehabilitative and Restorative Service Providers"

## 2022-07-03 DIAGNOSIS — M4802 Spinal stenosis, cervical region: Secondary | ICD-10-CM

## 2022-07-06 ENCOUNTER — Encounter: Payer: No Typology Code available for payment source | Admitting: Rehabilitative and Restorative Service Providers"

## 2022-07-09 ENCOUNTER — Other Ambulatory Visit (HOSPITAL_COMMUNITY): Payer: Self-pay

## 2022-07-20 ENCOUNTER — Other Ambulatory Visit (HOSPITAL_COMMUNITY): Payer: Self-pay

## 2022-07-25 ENCOUNTER — Other Ambulatory Visit (HOSPITAL_COMMUNITY): Payer: Self-pay

## 2022-07-25 ENCOUNTER — Encounter: Payer: Self-pay | Admitting: Family Medicine

## 2022-07-27 ENCOUNTER — Other Ambulatory Visit (HOSPITAL_COMMUNITY): Payer: Self-pay

## 2022-07-30 ENCOUNTER — Other Ambulatory Visit (HOSPITAL_COMMUNITY): Payer: Self-pay

## 2022-08-22 ENCOUNTER — Other Ambulatory Visit (HOSPITAL_COMMUNITY): Payer: Self-pay

## 2022-08-22 ENCOUNTER — Other Ambulatory Visit: Payer: Self-pay | Admitting: Pharmacist

## 2022-08-22 ENCOUNTER — Other Ambulatory Visit: Payer: Self-pay | Admitting: Family Medicine

## 2022-08-22 ENCOUNTER — Encounter: Payer: Self-pay | Admitting: Obstetrics and Gynecology

## 2022-08-22 ENCOUNTER — Ambulatory Visit (INDEPENDENT_AMBULATORY_CARE_PROVIDER_SITE_OTHER): Payer: No Typology Code available for payment source | Admitting: Obstetrics and Gynecology

## 2022-08-22 VITALS — BP 108/42 | HR 81 | Ht 61.0 in | Wt 165.0 lb

## 2022-08-22 DIAGNOSIS — N950 Postmenopausal bleeding: Secondary | ICD-10-CM | POA: Diagnosis not present

## 2022-08-22 MED ORDER — DUPIXENT 300 MG/2ML ~~LOC~~ SOAJ
SUBCUTANEOUS | 2 refills | Status: DC
  Filled 2022-08-22: qty 4, fill #0

## 2022-08-22 MED ORDER — DUPIXENT 300 MG/2ML ~~LOC~~ SOAJ
SUBCUTANEOUS | 2 refills | Status: DC
  Filled 2022-08-22: qty 4, fill #0
  Filled 2022-08-22: qty 4, 28d supply, fill #0
  Filled 2022-09-21: qty 4, 28d supply, fill #1
  Filled ????-??-??: fill #2

## 2022-08-22 NOTE — Progress Notes (Signed)
Tricia Potts presents for eval of PMB. Pt reports two episodes of some vaginal spotting, April and June. Only lasted a few days. LMP prior was 2 yrs ago. Pt denies any menopausal Sx except for some skin changes Mammogram and pap smear UTP  Denies any CP, SOB, bowel or bladder dysfunction  GYN U/S in April, normal, endometrial stripe 1 mm  PE AF VSS Chaperone present Lungs clear Heart RRR Abd soft + BS GU Nl EGBUS, cervix no lesions, uterus small, mobile, no masses or tenderness  A/P PMB           PMB reviewed with pt and information provided to pt. Discussed EMBX. Do not feel indicated as endometrial thickness was only 1 ml on U/S. Pt in agreement. Pt instructed to call for any more episodes of PMB and if has we will proceed to [facility]. Pt to f/u PRN

## 2022-08-22 NOTE — Patient Instructions (Signed)
Postmenopausal Bleeding Postmenopausal bleeding is any bleeding that a woman has after she has entered menopause. Menopause is the end of a woman's fertile years. After menopause, a woman no longer ovulates and does not have menstrual periods. Therefore, she should no longer have bleeding from her vagina. Postmenopausal bleeding may have various causes, including: Menopausal hormone therapy (MHT). Endometrial atrophy. After menopause, low estrogen hormone levels cause the membrane that lines the uterus (endometrium) to become thin. You may have bleeding as the endometrium thins. Endometrial hyperplasia. This condition is caused by excess estrogen hormones and low levels of progesterone hormones. The excess estrogen causes the endometrium to thicken, which can lead to bleeding. In some cases, this can lead to cancer of the uterus. Endometrial cancer. Noncancerous growths (polyps) on the endometrium, the lining of the uterus, or the cervix. Uterine fibroids. These are noncancerous growths in or around the uterus muscle tissue that can cause heavy bleeding. Any type of postmenopausal bleeding, even if it appears to be a typical menstrual period, should be checked by your health care provider. Treatment will depend on the cause of the bleeding. Follow these instructions at home:  Pay attention to any changes in your symptoms. Let your health care provider know about them. Avoid using tampons and douches as told by your health care provider. Change your pads regularly. Get regular pelvic exams, including Pap tests, as told by your health care provider. Take iron supplements as told by your health care provider. Take over-the-counter and prescription medicines only as told by your health care provider. Keep all follow-up visits. This is important. Contact a health care provider if: You have new bleeding from the vagina after menopause. You have pain in your abdomen. Get help right away if: You have  a fever or chills. You have severe pain with bleeding. You are passing blood clots. You have heavy bleeding, need more than 1 pad an hour, and have never experienced this before. You have headaches or feel faint or dizzy. Summary Postmenopausal bleeding is any bleeding that a woman has after she has entered into menopause. Postmenopausal bleeding may have various causes. Treatment will depend on the cause of the bleeding. Any type of postmenopausal bleeding, even if it appears to be a typical menstrual period, should be checked by your health care provider. Be sure to pay attention to any changes in your symptoms and keep all follow-up visits. This information is not intended to replace advice given to you by your health care provider. Make sure you discuss any questions you have with your health care provider. Document Revised: 05/26/2020 Document Reviewed: 05/26/2020 Elsevier Patient Education  2023 [employer]  

## 2022-08-22 NOTE — Progress Notes (Signed)
GYN/ Establish Care Postmenopausal bleeding PCP referral Reports bump outer labia right side

## 2022-08-29 ENCOUNTER — Other Ambulatory Visit (HOSPITAL_COMMUNITY): Payer: Self-pay

## 2022-09-14 ENCOUNTER — Other Ambulatory Visit: Payer: Self-pay | Admitting: Neurological Surgery

## 2022-09-18 ENCOUNTER — Other Ambulatory Visit (HOSPITAL_COMMUNITY): Payer: Self-pay

## 2022-09-21 ENCOUNTER — Other Ambulatory Visit (HOSPITAL_COMMUNITY): Payer: Self-pay

## 2022-09-24 ENCOUNTER — Other Ambulatory Visit (HOSPITAL_COMMUNITY): Payer: Self-pay

## 2022-09-26 ENCOUNTER — Other Ambulatory Visit (HOSPITAL_COMMUNITY): Payer: Self-pay

## 2022-10-16 ENCOUNTER — Other Ambulatory Visit (HOSPITAL_COMMUNITY): Payer: Self-pay

## 2022-10-16 MED ORDER — DOXYCYCLINE HYCLATE 100 MG PO CAPS
100.0000 mg | ORAL_CAPSULE | Freq: Every day | ORAL | 1 refills | Status: DC | PRN
  Filled 2022-10-16: qty 30, 30d supply, fill #0

## 2022-10-18 ENCOUNTER — Other Ambulatory Visit (HOSPITAL_COMMUNITY): Payer: Self-pay

## 2022-10-18 ENCOUNTER — Other Ambulatory Visit: Payer: Self-pay | Admitting: Family Medicine

## 2022-10-18 MED ORDER — VENLAFAXINE HCL ER 75 MG PO CP24
75.0000 mg | ORAL_CAPSULE | Freq: Every day | ORAL | 0 refills | Status: DC
  Filled ????-??-??: fill #0

## 2022-10-21 NOTE — Assessment & Plan Note (Signed)
Supplement and monitor 

## 2022-10-21 NOTE — Assessment & Plan Note (Signed)
Avoid offending foods, start probiotics. Do not eat large meals in late evening and consider raising head of bed.  

## 2022-10-21 NOTE — Assessment & Plan Note (Signed)
hgba1c acceptable, minimize simple carbs. Increase exercise as tolerated.  

## 2022-10-21 NOTE — Assessment & Plan Note (Signed)
Encourage heart healthy diet such as MIND or DASH diet, increase exercise, avoid trans fats, simple carbohydrates and processed foods, consider a krill or fish or flaxseed oil cap daily.  °

## 2022-10-21 NOTE — Assessment & Plan Note (Signed)
Continues to juggle many stressful concerns but does well most days

## 2022-10-21 NOTE — Progress Notes (Unsigned)
Subjective:    Patient ID: Tricia Potts, female    DOB: [birth year], 51 y.o.   MRN: [id number]  No chief complaint on file.   HPI Patient is in today for follow up on chronic medical concerns. No recent febrile illness or acute hospitalizations. Denies CP/palp/SOB/HA/congestion/fevers/GI or GU c/o. Taking meds as prescribed   Past Medical History:  Diagnosis Date   Allergy    Anemia 06/18/2014   Anxiety    Anxiety and depression 02/17/2009   Qualifier: Diagnosis of  By: Redmond Pulling MD, LauraLee     Asthma    Broken ankle 2011   (left) roller skating   Bronchitis, mucopurulent recurrent ([place]) 04/21/2013   Cervical cancer screening 02/20/2016   Depression    GERD (gastroesophageal reflux disease)    Hemorrhoid    History of hidradenitis suppurativa    Hyperlipemia    IBS (irritable bowel syndrome)    Kidney infection    as a child   Low back pain 05/25/2016   Osteopenia    PCO (polycystic ovaries)    Pruritus 08/22/2017    Past Surgical History:  Procedure Laterality Date   ANKLE SURGERY     plate and 8 screws in left ankle   HEMORRHOID SURGERY      Family History  Problem Relation Age of Onset   Allergies Mother    COPD Mother    Irritable bowel syndrome Mother    Heart disease Father        mitral valve disease/rupture during physical stress   GER disease Father    Irritable bowel syndrome Father    Heart failure Father    Hepatitis C Brother    Cancer Brother 46       ALL   Heart disease Daughter        asd s/p repair at age 60   Anxiety disorder Daughter    Cancer Maternal Grandmother        ovarian cancer   Heart disease Maternal Grandfather        MI at 66   Kidney disease Paternal Grandfather        possible kidney cancer   Allergies Sister    Eczema Sister    Cancer Sister 19       breast, DCIS   Allergies Daughter    Kidney disease Paternal Grandmother    Ovarian cancer Other        Grandmother   Arthritis Other    Irritable bowel syndrome Niece     Colon cancer Neg Hx    Rectal cancer Neg Hx    Esophageal cancer Neg Hx    Stomach cancer Neg Hx     Social History   Socioeconomic History   Marital status: Married    Spouse name: Not on file   Number of children: 3   Years of education: Not on file   Highest education level: Not on file  Occupational History   Occupation: Programmer, multimedia: [employer]  Tobacco Use   Smoking status: Never   Smokeless tobacco: Never  Vaping Use   Vaping Use: Never used  Substance and Sexual Activity   Alcohol use: No   Drug use: No   Sexual activity: Yes    Partners: Male    Comment: perimenopausal  Other Topics Concern   Not on file  Social History Narrative   Works in interventional radiology-RN [employer]   Married   3 daughters   Social Determinants of  Health   Financial Resource Strain: Not on file  Food Insecurity: Not on file  Transportation Needs: Not on file  Physical Activity: Not on file  Stress: Not on file  Social Connections: Not on file  Intimate Partner Violence: Not on file    Outpatient Medications Prior to Visit  Medication Sig Dispense Refill   albuterol (VENTOLIN HFA) 108 (90 Base) MCG/ACT inhaler Inhale 2 puffs into the lungs every 6 (six) hours as needed for wheezing or shortness of breath. (Patient not taking: Reported on 08/22/2022) 18 g 2   ALPRAZolam (XANAX) 0.25 MG tablet Take 1 tablet by mouth 2 (two) times daily as needed for anxiety. 40 tablet 1   atorvastatin (LIPITOR) 20 MG tablet Take 1 tablet (20 mg total) by mouth at bedtime. 90 tablet 1   CALCIUM CITRATE PO Take 2 tablets by mouth daily.     Cholecalciferol (VITAMIN D) 50 MCG (2000 UT) CAPS Take by mouth.     clindamycin (CLEOCIN T) 1 % external solution Apply a small amount to skin 2 - 3 times a week for acne 60 mL 5   Crisaborole 2 % OINT APPLY TO FINGERTIPS 2 TIMES DAILY. 100 g 3   Dermatological Products, Misc. [facility]) lotion Apply topically 2 (two) times daily.     doxycycline  (VIBRAMYCIN) 100 MG capsule Take 1 capsule (100 mg total) by mouth daily as needed. 30 capsule 1   Dupilumab (DUPIXENT) 300 MG/2ML SOPN Inject 1 pen under the skin every other week as directed 4 mL 2   famotidine (PEPCID) 40 MG tablet TAKE 1 TABLET BY MOUTH AT BEDTIME (Patient not taking: Reported on 08/22/2022) 90 tablet 1   fexofenadine (SM FEXOFENADINE HCL) 180 MG tablet TAKE 1 TABLET BY MOUTH DAILY. 90 tablet 1   fluticasone (FLONASE) 50 MCG/ACT nasal spray PLACE 2 SPRAYS INTO EACH NOSTRIL DAILY 48 g 1   hydrocortisone cream 1 % Apply 1 application topically as needed for itching.     ibuprofen (ADVIL,MOTRIN) 200 MG tablet Take 400-800 mg by mouth every 6 (six) hours as needed for headache or mild pain.     montelukast (SINGULAIR) 10 MG tablet Take 1 tablet (10 mg total) by mouth at bedtime as needed. 90 tablet 3   Multiple Vitamin (MULTI-VITAMIN DAILY PO) Take by mouth daily.     olopatadine (PATANOL) 0.1 % ophthalmic solution PLACE 1 DROP INTO BOTH EYES 2 TIMES DAILY. (Patient not taking: Reported on 08/22/2022) 5 mL 12   OVER THE COUNTER MEDICATION Place 1 drop into both eyes as needed (allergy symptoms). OTC eye drops  Systane/Artificial Tears/Homeopathic eye drops     venlafaxine XR (EFFEXOR-XR) 75 MG 24 hr capsule Take 1 capsule (75 mg total) by mouth daily with breakfast. 90 capsule 0   No facility-administered medications prior to visit.    No Active Allergies  Review of Systems  Constitutional:  Negative for fever and malaise/fatigue.  HENT:  Negative for congestion.   Eyes:  Negative for blurred vision.  Respiratory:  Negative for shortness of breath.   Cardiovascular:  Negative for chest pain, palpitations and leg swelling.  Gastrointestinal:  Negative for abdominal pain, blood in stool and nausea.  Genitourinary:  Negative for dysuria and frequency.  Musculoskeletal:  Negative for falls.  Skin:  Negative for rash.  Neurological:  Negative for dizziness, loss of  consciousness and headaches.  Endo/Heme/Allergies:  Negative for environmental allergies.  Psychiatric/Behavioral:  Negative for depression. The patient is not nervous/anxious.  Objective:    Physical Exam Constitutional:      General: She is not in acute distress.    Appearance: She is well-developed.  HENT:     Head: Normocephalic and atraumatic.  Eyes:     Conjunctiva/sclera: Conjunctivae normal.  Neck:     Thyroid: No thyromegaly.  Cardiovascular:     Rate and Rhythm: Normal rate and regular rhythm.     Heart sounds: Normal heart sounds. No murmur heard. Pulmonary:     Effort: Pulmonary effort is normal. No respiratory distress.     Breath sounds: Normal breath sounds.  Abdominal:     General: Bowel sounds are normal. There is no distension.     Palpations: Abdomen is soft. There is no mass.     Tenderness: There is no abdominal tenderness.  Musculoskeletal:     Cervical back: Neck supple.  Lymphadenopathy:     Cervical: No cervical adenopathy.  Skin:    General: Skin is warm and dry.  Neurological:     Mental Status: She is alert and oriented to person, place, and time.  Psychiatric:        Behavior: Behavior normal.     There were no vitals taken for this visit. Wt Readings from Last 3 Encounters:  08/22/22 165 lb (74.8 kg)  06/05/22 163 lb (73.9 kg)  04/13/22 163 lb (73.9 kg)    Diabetic Foot Exam - Simple   No data filed    Lab Results  Component Value Date   WBC 5.3 06/05/2022   HGB 14.2 06/05/2022   HCT 41.5 06/05/2022   PLT 301.0 06/05/2022   GLUCOSE 109 (H) 06/05/2022   CHOL 201 (H) 06/05/2022   TRIG 259.0 (H) 06/05/2022   HDL 66.00 06/05/2022   LDLDIRECT 114.0 06/05/2022   LDLCALC 154 (H) 09/19/2018   ALT 18 06/05/2022   AST 18 06/05/2022   NA 138 06/05/2022   K 4.1 06/05/2022   CL 102 06/05/2022   CREATININE 0.67 06/05/2022   BUN 13 06/05/2022   CO2 26 06/05/2022   TSH 3.42 06/05/2022   HGBA1C 5.8 06/05/2022    Lab  Results  Component Value Date   TSH 3.42 06/05/2022   Lab Results  Component Value Date   WBC 5.3 06/05/2022   HGB 14.2 06/05/2022   HCT 41.5 06/05/2022   MCV 91.9 06/05/2022   PLT 301.0 06/05/2022   Lab Results  Component Value Date   NA 138 06/05/2022   K 4.1 06/05/2022   CO2 26 06/05/2022   GLUCOSE 109 (H) 06/05/2022   BUN 13 06/05/2022   CREATININE 0.67 06/05/2022   BILITOT 0.4 06/05/2022   ALKPHOS 70 06/05/2022   AST 18 06/05/2022   ALT 18 06/05/2022   PROT 7.3 06/05/2022   ALBUMIN 4.4 06/05/2022   CALCIUM 9.7 06/05/2022   GFR 99.89 06/05/2022   Lab Results  Component Value Date   CHOL 201 (H) 06/05/2022   Lab Results  Component Value Date   HDL 66.00 06/05/2022   Lab Results  Component Value Date   LDLCALC 154 (H) 09/19/2018   Lab Results  Component Value Date   TRIG 259.0 (H) 06/05/2022   Lab Results  Component Value Date   CHOLHDL 3 06/05/2022   Lab Results  Component Value Date   HGBA1C 5.8 06/05/2022       Assessment & Plan:   Problem List Items Addressed This Visit     Hyperlipidemia, mixed    Encourage heart healthy diet  such as MIND or DASH diet, increase exercise, avoid trans fats, simple carbohydrates and processed foods, consider a krill or fish or flaxseed oil cap daily.       Anxiety and depression    Continues to juggle many stressful concerns but does well most days      Acid reflux    Avoid offending foods, start probiotics. Do not eat large meals in late evening and consider raising head of bed.       Hyperglycemia    hgba1c acceptable, minimize simple carbs. Increase exercise as tolerated.      Low vitamin D level    Supplement and monitor       I am having Tricia Potts maintain her OVER THE COUNTER MEDICATION, Multiple Vitamin (MULTI-VITAMIN DAILY PO), CALCIUM CITRATE PO, ibuprofen, hydrocortisone cream, Vitamin D, olopatadine, albuterol, clindamycin, EpiCeram, famotidine, montelukast, fluticasone, ALPRAZolam,  atorvastatin, fexofenadine, Crisaborole, Dupixent, doxycycline, and venlafaxine XR.  No orders of the defined types were placed in this encounter.    Penni Homans, MD

## 2022-10-22 ENCOUNTER — Ambulatory Visit (INDEPENDENT_AMBULATORY_CARE_PROVIDER_SITE_OTHER): Payer: No Typology Code available for payment source | Admitting: Family Medicine

## 2022-10-22 ENCOUNTER — Encounter: Payer: Self-pay | Admitting: Family Medicine

## 2022-10-22 ENCOUNTER — Other Ambulatory Visit: Payer: Self-pay | Admitting: Family Medicine

## 2022-10-22 ENCOUNTER — Other Ambulatory Visit (HOSPITAL_COMMUNITY): Payer: Self-pay

## 2022-10-22 ENCOUNTER — Other Ambulatory Visit: Payer: Self-pay | Admitting: Pharmacist

## 2022-10-22 DIAGNOSIS — G5603 Carpal tunnel syndrome, bilateral upper limbs: Secondary | ICD-10-CM

## 2022-10-22 DIAGNOSIS — R739 Hyperglycemia, unspecified: Secondary | ICD-10-CM

## 2022-10-22 DIAGNOSIS — L309 Dermatitis, unspecified: Secondary | ICD-10-CM

## 2022-10-22 DIAGNOSIS — F419 Anxiety disorder, unspecified: Secondary | ICD-10-CM

## 2022-10-22 DIAGNOSIS — E782 Mixed hyperlipidemia: Secondary | ICD-10-CM

## 2022-10-22 DIAGNOSIS — R7989 Other specified abnormal findings of blood chemistry: Secondary | ICD-10-CM | POA: Diagnosis not present

## 2022-10-22 DIAGNOSIS — F32A Depression, unspecified: Secondary | ICD-10-CM

## 2022-10-22 DIAGNOSIS — K219 Gastro-esophageal reflux disease without esophagitis: Secondary | ICD-10-CM | POA: Diagnosis not present

## 2022-10-22 DIAGNOSIS — G5600 Carpal tunnel syndrome, unspecified upper limb: Secondary | ICD-10-CM | POA: Insufficient documentation

## 2022-10-22 LAB — COMPREHENSIVE METABOLIC PANEL
ALT: 21 U/L (ref 0–35)
AST: 23 U/L (ref 0–37)
Albumin: 4.5 g/dL (ref 3.5–5.2)
Alkaline Phosphatase: 66 U/L (ref 39–117)
BUN: 13 mg/dL (ref 6–23)
CO2: 25 mEq/L (ref 19–32)
Calcium: 9.6 mg/dL (ref 8.4–10.5)
Chloride: 103 mEq/L (ref 96–112)
Creatinine, Ser: 0.64 mg/dL (ref 0.40–1.20)
GFR: 100.73 mL/min (ref >60.00)
Glucose, Bld: 93 mg/dL (ref 70–99)
Potassium: 4.1 mEq/L (ref 3.5–5.1)
Sodium: 138 mEq/L (ref 135–145)
Total Bilirubin: 0.6 mg/dL (ref 0.2–1.2)
Total Protein: 7.4 g/dL (ref 6.0–8.3)

## 2022-10-22 LAB — CBC
HCT: 42.2 % (ref 36.0–46.0)
Hemoglobin: 14.2 g/dL (ref 12.0–15.0)
MCHC: 33.7 g/dL (ref 30.0–36.0)
MCV: 92 fl (ref 78.0–100.0)
Platelets: 295 10*3/uL (ref 150.0–400.0)
RBC: 4.58 Mil/uL (ref 3.87–5.11)
RDW: 12.8 % (ref 11.5–15.5)
WBC: 4.7 10*3/uL (ref 4.0–10.5)

## 2022-10-22 LAB — HEMOGLOBIN A1C: Hgb A1c MFr Bld: 6 % (ref 4.6–6.5)

## 2022-10-22 LAB — VITAMIN D 25 HYDROXY (VIT D DEFICIENCY, FRACTURES): VITD: 27.39 ng/mL — ABNORMAL LOW (ref 30.00–100.00)

## 2022-10-22 LAB — LIPID PANEL
Cholesterol: 182 mg/dL (ref 0–200)
HDL: 69.6 mg/dL (ref >39.00)
LDL Cholesterol: 89 mg/dL (ref 0–99)
NonHDL: 112.53
Total CHOL/HDL Ratio: 3
Triglycerides: 120 mg/dL (ref 0.0–149.0)
VLDL: 24 mg/dL (ref 0.0–40.0)

## 2022-10-22 LAB — TSH: TSH: 2.94 u[IU]/mL (ref 0.35–5.50)

## 2022-10-22 MED ORDER — DUPIXENT 300 MG/2ML ~~LOC~~ SOAJ
SUBCUTANEOUS | 2 refills | Status: DC
  Filled 2022-10-22: qty 4, fill #0

## 2022-10-22 MED ORDER — VENLAFAXINE HCL ER 75 MG PO CP24
75.0000 mg | ORAL_CAPSULE | Freq: Every day | ORAL | 1 refills | Status: DC
  Filled 2022-10-22: qty 90, 90d supply, fill #0
  Filled 2023-01-20: qty 90, 90d supply, fill #1

## 2022-10-22 MED ORDER — DUPIXENT 300 MG/2ML ~~LOC~~ SOAJ: SUBCUTANEOUS | 4 refills | Status: DC

## 2022-10-22 MED ORDER — DUPIXENT 300 MG/2ML ~~LOC~~ SOAJ
SUBCUTANEOUS | 2 refills | Status: DC
  Filled 2022-10-22: qty 4, fill #0
  Filled 2022-10-22: qty 4, 28d supply, fill #0
  Filled 2022-11-13: qty 4, 28d supply, fill #1
  Filled 2022-12-12: qty 4, 28d supply, fill #2

## 2022-10-22 NOTE — Assessment & Plan Note (Signed)
Is on Doxycyline from Derm and she is starting Tacrolimus. Started probiotic. Is on Allegra daily can increase to BID and restart Pepcid bid

## 2022-10-22 NOTE — Patient Instructions (Signed)

## 2022-10-22 NOTE — Assessment & Plan Note (Signed)
Left>Right has surgery on left soon and then the right will be next

## 2022-10-23 ENCOUNTER — Other Ambulatory Visit (HOSPITAL_COMMUNITY): Payer: Self-pay

## 2022-10-23 MED ORDER — FAMOTIDINE 40 MG PO TABS
40.0000 mg | ORAL_TABLET | Freq: Every day | ORAL | 1 refills | Status: DC
  Filled 2022-10-23: qty 90, 90d supply, fill #0

## 2022-10-25 ENCOUNTER — Other Ambulatory Visit: Payer: Self-pay | Admitting: Family Medicine

## 2022-10-25 ENCOUNTER — Other Ambulatory Visit (HOSPITAL_COMMUNITY): Payer: Self-pay

## 2022-10-25 ENCOUNTER — Encounter: Payer: Self-pay | Admitting: Family Medicine

## 2022-10-26 ENCOUNTER — Other Ambulatory Visit: Payer: Self-pay | Admitting: Family Medicine

## 2022-10-26 DIAGNOSIS — R0681 Apnea, not elsewhere classified: Secondary | ICD-10-CM

## 2022-10-26 DIAGNOSIS — R5383 Other fatigue: Secondary | ICD-10-CM

## 2022-10-26 DIAGNOSIS — R0683 Snoring: Secondary | ICD-10-CM

## 2022-11-01 ENCOUNTER — Encounter (HOSPITAL_COMMUNITY): Payer: Self-pay | Admitting: Neurological Surgery

## 2022-11-01 ENCOUNTER — Other Ambulatory Visit: Payer: Self-pay

## 2022-11-01 NOTE — Progress Notes (Signed)
PCP - Penni Homans, MD Cardiologist - denies  PPM/ICD - denies  Chest x-ray - n/a EKG - n/a  CPAP - n/a  Fasting Blood Sugar - n/a  Blood Thinner Instructions: n/a Aspirin Instructions: Patient was instructed: As of today, STOP taking any Aspirin (unless otherwise instructed by your surgeon) Aleve, Naproxen, Ibuprofen, Motrin, Advil, Goody's, BC's, all herbal medications, fish oil, and all vitamins.  ERAS Protcol - n/a  COVID TEST- n/a  Anesthesia review: no  Patient verbally denies any shortness of breath, fever, cough and chest pain during phone call   -------------  SDW INSTRUCTIONS given:  Your procedure is scheduled on Monday, November 13th, 2023.  Report to [facility]" at 08:00 A.M., and check in at the Admitting office.  Call this number if you have problems the morning of surgery:  [phone]   Remember:  Do not eat or drink after midnight the night before your surgery    Take these medicines the morning of surgery with A SIP OF WATER: Effexor, Fexofenadine PRN: Xanax, Doxycycline, Flonase; eye drops, inhaler  Please bring all inhalers with you the day of surgery.     The day of surgery:                     Do not wear jewelry, make up, or nail polish            Do not wear lotions, powders, perfumes, or deodorant.            Do not shave 48 hours prior to surgery.              Do not bring valuables to the hospital.            [facility] is not responsible for any belongings or valuables.  Do NOT Smoke (Tobacco/Vaping) 24 hours prior to your procedure If you use a CPAP at night, you may bring all equipment for your overnight stay.   Contacts, glasses, dentures or bridgework may not be worn into surgery.      For patients admitted to the hospital, discharge time will be determined by your treatment team.   Patients discharged the day of surgery will not be allowed to drive home, and someone needs to stay with them for 24  hours.    Special instructions:   [facility]- Preparing For Surgery  Before surgery, you can play an important role. Because skin is not sterile, your skin needs to be as free of germs as possible. You can reduce the number of germs on your skin by washing with CHG (chlorahexidine gluconate) Soap before surgery.  CHG is an antiseptic cleaner which kills germs and bonds with the skin to continue killing germs even after washing.    Oral Hygiene is also important to reduce your risk of infection.  Remember - BRUSH YOUR TEETH THE MORNING OF SURGERY WITH YOUR REGULAR TOOTHPASTE  Please do not use if you have an allergy to CHG or antibacterial soaps. If your skin becomes reddened/irritated stop using the CHG.  Do not shave (including legs and underarms) for at least 48 hours prior to first CHG shower. It is OK to shave your face.  Please follow these instructions carefully.   Shower the NIGHT BEFORE SURGERY and the MORNING OF SURGERY with DIAL Soap.   Pat yourself dry with a CLEAN TOWEL.  Wear CLEAN PAJAMAS to bed the night before surgery  Place CLEAN SHEETS on your bed  the night of your first shower and DO NOT SLEEP WITH PETS.   Day of Surgery: Please shower morning of surgery  Wear Clean/Comfortable clothing the morning of surgery Do not apply any deodorants/lotions.   Remember to brush your teeth WITH YOUR REGULAR TOOTHPASTE.   Questions were answered. Patient verbalized understanding of instructions.

## 2022-11-04 NOTE — Anesthesia Preprocedure Evaluation (Signed)
Anesthesia Evaluation  Patient identified by MRN, date of birth, ID band Patient awake    Reviewed: Allergy & Precautions, H&P , NPO status , Patient's Chart, lab work & pertinent test results  Airway Mallampati: III  TM Distance: >3 FB Neck ROM: Full    Dental no notable dental hx. (+) Teeth Intact, Dental Advisory Given   Pulmonary asthma    Pulmonary exam normal breath sounds clear to auscultation       Cardiovascular Exercise Tolerance: Good negative cardio ROS  Rhythm:Regular Rate:Normal     Neuro/Psych   Anxiety Depression    negative neurological ROS     GI/Hepatic Neg liver ROS,GERD  Medicated,,  Endo/Other  negative endocrine ROS    Renal/GU negative Renal ROS  negative genitourinary   Musculoskeletal   Abdominal   Peds  Hematology  (+) Blood dyscrasia, anemia   Anesthesia Other Findings   Reproductive/Obstetrics negative OB ROS                             Anesthesia Physical Anesthesia Plan  ASA: 2  Anesthesia Plan: MAC   Post-op Pain Management: Tylenol PO (pre-op)* and Toradol IV (intra-op)*   Induction: Intravenous  PONV Risk Score and Plan: 3 and Ondansetron, Dexamethasone, Propofol infusion and Midazolam  Airway Management Planned: Natural Airway and Simple Face Mask  Additional Equipment:   Intra-op Plan:   Post-operative Plan:   Informed Consent: I have reviewed the patients History and Physical, chart, labs and discussed the procedure including the risks, benefits and alternatives for the proposed anesthesia with the patient or authorized representative who has indicated his/her understanding and acceptance.     Dental advisory given  Plan Discussed with: CRNA  Anesthesia Plan Comments:        Anesthesia Quick Evaluation

## 2022-11-05 ENCOUNTER — Other Ambulatory Visit: Payer: Self-pay

## 2022-11-05 ENCOUNTER — Ambulatory Visit (HOSPITAL_BASED_OUTPATIENT_CLINIC_OR_DEPARTMENT_OTHER): Payer: No Typology Code available for payment source | Admitting: Certified Registered Nurse Anesthetist

## 2022-11-05 ENCOUNTER — Encounter (HOSPITAL_COMMUNITY): Payer: Self-pay | Admitting: Neurological Surgery

## 2022-11-05 ENCOUNTER — Encounter (HOSPITAL_COMMUNITY)
Admission: RE | Disposition: A | Discharge status: Home / Self Care | Payer: Self-pay | Source: Home / Self Care | Attending: Neurological Surgery

## 2022-11-05 ENCOUNTER — Ambulatory Visit (HOSPITAL_COMMUNITY)
Admission: RE | Admit: 2022-11-05 | Discharge: 2022-11-05 | Disposition: A | Discharge status: Home / Self Care | Payer: No Typology Code available for payment source | Attending: Neurological Surgery | Admitting: Neurological Surgery

## 2022-11-05 ENCOUNTER — Other Ambulatory Visit (HOSPITAL_COMMUNITY): Payer: Self-pay

## 2022-11-05 ENCOUNTER — Ambulatory Visit (HOSPITAL_COMMUNITY): Payer: No Typology Code available for payment source | Admitting: Certified Registered Nurse Anesthetist

## 2022-11-05 DIAGNOSIS — F419 Anxiety disorder, unspecified: Secondary | ICD-10-CM | POA: Insufficient documentation

## 2022-11-05 DIAGNOSIS — G5602 Carpal tunnel syndrome, left upper limb: Secondary | ICD-10-CM

## 2022-11-05 DIAGNOSIS — F32A Depression, unspecified: Secondary | ICD-10-CM | POA: Diagnosis not present

## 2022-11-05 DIAGNOSIS — K219 Gastro-esophageal reflux disease without esophagitis: Secondary | ICD-10-CM | POA: Diagnosis not present

## 2022-11-05 DIAGNOSIS — D649 Anemia, unspecified: Secondary | ICD-10-CM | POA: Diagnosis not present

## 2022-11-05 DIAGNOSIS — J45909 Unspecified asthma, uncomplicated: Secondary | ICD-10-CM | POA: Diagnosis not present

## 2022-11-05 DIAGNOSIS — F418 Other specified anxiety disorders: Secondary | ICD-10-CM

## 2022-11-05 HISTORY — DX: Pneumonia, unspecified organism: J18.9

## 2022-11-05 HISTORY — PX: CARPAL TUNNEL RELEASE: SHX101

## 2022-11-05 LAB — POCT PREGNANCY, URINE: Preg Test, Ur: NEGATIVE

## 2022-11-05 SURGERY — CARPAL TUNNEL RELEASE
Anesthesia: Monitor Anesthesia Care | Site: Wrist | Laterality: Left

## 2022-11-05 MED ORDER — HYDROCODONE-ACETAMINOPHEN 5-325 MG PO TABS
1.0000 | ORAL_TABLET | ORAL | 0 refills | Status: DC | PRN
  Filled 2022-11-05: qty 20, 4d supply, fill #0

## 2022-11-05 MED ORDER — BUPIVACAINE HCL (PF) 0.25 % IJ SOLN
INTRAMUSCULAR | Status: AC
  Filled 2022-11-05: qty 30

## 2022-11-05 MED ORDER — PROPOFOL 10 MG/ML IV BOLUS
INTRAVENOUS | Status: AC
  Filled 2022-11-05: qty 20

## 2022-11-05 MED ORDER — BUPIVACAINE HCL (PF) 0.25 % IJ SOLN
INTRAMUSCULAR | Status: DC | PRN
  Administered 2022-11-05: 10 mL

## 2022-11-05 MED ORDER — CHLORHEXIDINE GLUCONATE 0.12 % MT SOLN
OROMUCOSAL | Status: AC
  Filled 2022-11-05: qty 15

## 2022-11-05 MED ORDER — MIDAZOLAM HCL 2 MG/2ML IJ SOLN
INTRAMUSCULAR | Status: DC | PRN
  Administered 2022-11-05: 2 mg via INTRAVENOUS

## 2022-11-05 MED ORDER — CEFAZOLIN SODIUM-DEXTROSE 2-4 GM/100ML-% IV SOLN
2.0000 g | INTRAVENOUS | Status: AC
  Administered 2022-11-05: 2 g via INTRAVENOUS
  Filled 2022-11-05: qty 100

## 2022-11-05 MED ORDER — ONDANSETRON HCL 4 MG/2ML IJ SOLN
INTRAMUSCULAR | Status: AC
  Filled 2022-11-05: qty 2

## 2022-11-05 MED ORDER — KETOROLAC TROMETHAMINE 30 MG/ML IJ SOLN
INTRAMUSCULAR | Status: AC
  Filled 2022-11-05: qty 1

## 2022-11-05 MED ORDER — ACETAMINOPHEN 500 MG PO TABS
1000.0000 mg | ORAL_TABLET | Freq: Once | ORAL | Status: AC
  Administered 2022-11-05: 1000 mg via ORAL
  Filled 2022-11-05: qty 2

## 2022-11-05 MED ORDER — KETOROLAC TROMETHAMINE 30 MG/ML IJ SOLN
INTRAMUSCULAR | Status: DC | PRN
  Administered 2022-11-05: 30 mg via INTRAVENOUS

## 2022-11-05 MED ORDER — CHLORHEXIDINE GLUCONATE CLOTH 2 % EX PADS: 6.0000 | MEDICATED_PAD | Freq: Once | CUTANEOUS | Status: DC

## 2022-11-05 MED ORDER — LACTATED RINGERS IV SOLN: INTRAVENOUS | Status: DC

## 2022-11-05 MED ORDER — MIDAZOLAM HCL 2 MG/2ML IJ SOLN
INTRAMUSCULAR | Status: AC
  Filled 2022-11-05: qty 2

## 2022-11-05 MED ORDER — FENTANYL CITRATE (PF) 100 MCG/2ML IJ SOLN: 25.0000 ug | INTRAMUSCULAR | Status: DC | PRN

## 2022-11-05 MED ORDER — ONDANSETRON HCL 4 MG/2ML IJ SOLN
INTRAMUSCULAR | Status: DC | PRN
  Administered 2022-11-05: 4 mg via INTRAVENOUS

## 2022-11-05 MED ORDER — FENTANYL CITRATE (PF) 250 MCG/5ML IJ SOLN
INTRAMUSCULAR | Status: AC
  Filled 2022-11-05: qty 5

## 2022-11-05 MED ORDER — ORAL CARE MOUTH RINSE: 15.0000 mL | Freq: Once | OROMUCOSAL | Status: AC

## 2022-11-05 MED ORDER — 0.9 % SODIUM CHLORIDE (POUR BTL) OPTIME
TOPICAL | Status: DC | PRN
  Administered 2022-11-05: 1000 mL

## 2022-11-05 MED ORDER — CHLORHEXIDINE GLUCONATE 0.12 % MT SOLN
15.0000 mL | Freq: Once | OROMUCOSAL | Status: AC
  Administered 2022-11-05: 15 mL via OROMUCOSAL

## 2022-11-05 MED ORDER — PROPOFOL 500 MG/50ML IV EMUL
INTRAVENOUS | Status: DC | PRN
  Administered 2022-11-05: 100 ug/kg/min via INTRAVENOUS

## 2022-11-05 SURGICAL SUPPLY — 41 items
BAG COUNTER SPONGE SURGICOUNT (BAG) ×1 IMPLANT
BAG SPNG CNTER NS LX DISP (BAG) ×1
BLADE SURG 15 STRL LF DISP TIS (BLADE) ×1 IMPLANT
BLADE SURG 15 STRL SS (BLADE) ×1
BNDG ELASTIC 4X5.8 VLCR STR LF (GAUZE/BANDAGES/DRESSINGS) ×1 IMPLANT
BNDG GAUZE DERMACEA FLUFF 4 (GAUZE/BANDAGES/DRESSINGS) ×1 IMPLANT
BNDG GZE DERMACEA 4 6PLY (GAUZE/BANDAGES/DRESSINGS) ×1
CABLE BIPOLOR RESECTION CORD (MISCELLANEOUS) ×1 IMPLANT
CANISTER SUCT 3000ML PPV (MISCELLANEOUS) ×1 IMPLANT
DRAPE EXTREMITY T 121X128X90 (DISPOSABLE) ×1 IMPLANT
DRAPE HALF SHEET 40X57 (DRAPES) ×1 IMPLANT
DRSG XEROFORM 1X8 (GAUZE/BANDAGES/DRESSINGS) IMPLANT
DURAPREP 26ML APPLICATOR (WOUND CARE) ×1 IMPLANT
GAUZE 4X4 16PLY ~~LOC~~+RFID DBL (SPONGE) ×1 IMPLANT
GAUZE SPONGE 4X4 12PLY STRL (GAUZE/BANDAGES/DRESSINGS) ×1 IMPLANT
GLOVE BIO SURGEON STRL SZ7 (GLOVE) IMPLANT
GLOVE BIO SURGEON STRL SZ8 (GLOVE) ×1 IMPLANT
GLOVE BIOGEL PI IND STRL 7.0 (GLOVE) IMPLANT
GOWN STRL REUS W/ TWL LRG LVL3 (GOWN DISPOSABLE) IMPLANT
GOWN STRL REUS W/ TWL XL LVL3 (GOWN DISPOSABLE) IMPLANT
GOWN STRL REUS W/TWL 2XL LVL3 (GOWN DISPOSABLE) ×1 IMPLANT
GOWN STRL REUS W/TWL LRG LVL3 (GOWN DISPOSABLE)
GOWN STRL REUS W/TWL XL LVL3 (GOWN DISPOSABLE)
KIT BASIN OR (CUSTOM PROCEDURE TRAY) ×1 IMPLANT
KIT TURNOVER KIT B (KITS) ×1 IMPLANT
NDL HYPO 25X1 1.5 SAFETY (NEEDLE) ×1 IMPLANT
NEEDLE HYPO 25X1 1.5 SAFETY (NEEDLE) ×1 IMPLANT
NS IRRIG 1000ML POUR BTL (IV SOLUTION) ×1 IMPLANT
PACK BASIC III (CUSTOM PROCEDURE TRAY) ×1
PACK SRG BSC III STRL LF ECLPS (CUSTOM PROCEDURE TRAY) ×1 IMPLANT
PAD ARMBOARD 7.5X6 YLW CONV (MISCELLANEOUS) ×1 IMPLANT
STOCKINETTE 4X48 STRL (DRAPES) ×1 IMPLANT
SUT ETHILON 4 0 PS 2 18 (SUTURE) ×1 IMPLANT
SUT VIC AB 3-0 SH 8-18 (SUTURE) ×1 IMPLANT
SYR BULB EAR ULCER 3OZ GRN STR (SYRINGE) ×1 IMPLANT
SYR CONTROL 10ML LL (SYRINGE) ×1 IMPLANT
TOWEL GREEN STERILE (TOWEL DISPOSABLE) ×1 IMPLANT
TOWEL GREEN STERILE FF (TOWEL DISPOSABLE) ×1 IMPLANT
TUBE CONNECTING 12X1/4 (SUCTIONS) ×1 IMPLANT
UNDERPAD 30X36 HEAVY ABSORB (UNDERPADS AND DIAPERS) ×1 IMPLANT
WATER STERILE IRR 1000ML POUR (IV SOLUTION) ×1 IMPLANT

## 2022-11-05 NOTE — H&P (Signed)
Subjective: Patient is a 51 y.o. female admitted for CTS. Onset of symptoms was several months ago, gradually worsening since that time.  The pain is rated mild, and is located at the hands. The pain is described as aching and occurs intermittently. The symptoms have been progressive. Symptoms are exacerbated by flexion and lying down. EMGs showed B CTS   Past Medical History:  Diagnosis Date   Allergy    Anemia 06/18/2014   Anxiety    Anxiety and depression 02/17/2009   Qualifier: Diagnosis of  By: Redmond Pulling MD, LauraLee     Asthma    Broken ankle 2011   (left) roller skating   Bronchitis, mucopurulent recurrent ([place]) 04/21/2013   Cervical cancer screening 02/20/2016   Depression    GERD (gastroesophageal reflux disease)    Hemorrhoid    History of hidradenitis suppurativa    Hyperlipemia    IBS (irritable bowel syndrome)    Kidney infection    as a child   Low back pain 05/25/2016   Osteopenia    PCO (polycystic ovaries)    Pneumonia    Pruritus 08/22/2017    Past Surgical History:  Procedure Laterality Date   ANKLE SURGERY     plate and 8 screws in left ankle   HEMORRHOID SURGERY      Prior to Admission medications   Medication Sig Start Date End Date Taking? Authorizing Provider  ALPRAZolam Duanne Moron) 0.25 MG tablet Take 1 tablet by mouth 2 (two) times daily as needed for anxiety. 06/05/22  Yes Mosie Lukes, MD  atorvastatin (LIPITOR) 20 MG tablet Take 1 tablet (20 mg total) by mouth at bedtime. 06/06/22 06/06/23 Yes Mosie Lukes, MD  CALCIUM CITRATE PO Take 2 tablets by mouth daily.   Yes [provider]  Cholecalciferol (VITAMIN D) 50 MCG (2000 UT) CAPS Take 2,000 Units by mouth daily.   Yes [provider]  clindamycin (CLEOCIN T) 1 % external solution Apply a small amount to skin 2 - 3 times a week for acne 08/02/21  Yes   Crisaborole 2 % OINT APPLY TO FINGERTIPS 2 TIMES DAILY. 06/29/22  Yes Mosie Lukes, MD  Dermatological Products, Misc. [facility])  lotion Apply 1 Application topically 2 (two) times daily as needed (atopic dermatitis). 08/02/21  Yes [provider]  doxycycline (VIBRAMYCIN) 100 MG capsule Take 1 capsule (100 mg total) by mouth daily as needed. 10/16/22  Yes   Dupilumab (DUPIXENT) 300 MG/2ML SOPN Inject 1 pen under the skin every other week as directed 10/22/22  Yes Jegede, Olugbemiga E, MD  famotidine (PEPCID) 40 MG tablet Take 1 tablet (40 mg total) by mouth at bedtime. 10/23/22  Yes Mosie Lukes, MD  fexofenadine (SM FEXOFENADINE HCL) 180 MG tablet TAKE 1 TABLET BY MOUTH DAILY. 06/28/22 06/28/23 Yes Mosie Lukes, MD  fluticasone (FLONASE) 50 MCG/ACT nasal spray PLACE 2 SPRAYS INTO EACH NOSTRIL DAILY Patient taking differently: Place 2 sprays into both nostrils daily as needed for allergies. 02/27/22 02/27/23 Yes Mosie Lukes, MD  hydrocortisone cream 1 % Apply 1 application topically as needed for itching.   Yes [provider]  Multiple Vitamin (MULTI-VITAMIN DAILY PO) Take 1 tablet by mouth daily.   Yes [provider]  OVER THE COUNTER MEDICATION Place 1 drop into both eyes as needed (allergy symptoms). OTC eye drops  Systane/Artificial Tears/Homeopathic eye drops   Yes [provider]  Probiotic Product (PROBIOTIC DAILY PO) Take 1 capsule by mouth daily.  Yes [provider]  tacrolimus (PROTOPIC) 0.1 % ointment Apply 1 Application topically 2 (two) times daily.   Yes [provider]  venlafaxine XR (EFFEXOR-XR) 75 MG 24 hr capsule Take 1 capsule (75 mg total) by mouth daily with breakfast. 10/22/22  Yes Mosie Lukes, MD  albuterol (VENTOLIN HFA) 108 (90 Base) MCG/ACT inhaler Inhale 2 puffs into the lungs every 6 (six) hours as needed for wheezing or shortness of breath. 06/01/21   Mosie Lukes, MD  Dupilumab ([place]) 300 MG/2ML SOPN inject one pen subcutaneously evrey other week Patient not taking: Reported on 10/30/2022 10/22/22     ibuprofen (ADVIL,MOTRIN) 200  MG tablet Take 400-800 mg by mouth every 6 (six) hours as needed for headache or mild pain.    [provider]  montelukast (SINGULAIR) 10 MG tablet Take 1 tablet (10 mg total) by mouth at bedtime as needed. 02/19/22   Mosie Lukes, MD  olopatadine (PATANOL) 0.1 % ophthalmic solution PLACE 1 DROP INTO BOTH EYES 2 TIMES DAILY. Patient taking differently: Place 1 drop into both eyes 2 (two) times daily as needed for allergies. 06/13/20   Mosie Lukes, MD   No Known Allergies  Social History   Tobacco Use   Smoking status: Never   Smokeless tobacco: Never  Substance Use Topics   Alcohol use: No    Family History  Problem Relation Age of Onset   Allergies Mother    COPD Mother    Irritable bowel syndrome Mother    Heart disease Father        mitral valve disease/rupture during physical stress   GER disease Father    Irritable bowel syndrome Father    Heart failure Father    Hepatitis C Brother    Cancer Brother 27       ALL   Heart disease Daughter        asd s/p repair at age 70   Anxiety disorder Daughter    Cancer Maternal Grandmother        ovarian cancer   Heart disease Maternal Grandfather        MI at 76   Kidney disease Paternal Grandfather        possible kidney cancer   Allergies Sister    Eczema Sister    Cancer Sister 45       breast, DCIS   Allergies Daughter    Kidney disease Paternal Grandmother    Ovarian cancer Other        Grandmother   Arthritis Other    Irritable bowel syndrome Niece    Colon cancer Neg Hx    Rectal cancer Neg Hx    Esophageal cancer Neg Hx    Stomach cancer Neg Hx      Review of Systems  Positive ROS: neg  All other systems have been reviewed and were otherwise negative with the exception of those mentioned in the HPI and as above.  Objective: Vital signs in last 24 hours: Temp:  [99.2 F (37.3 C)] 99.2 F (37.3 C) (11/13 0826) Pulse Rate:  [65] 65 (11/13 0826) Resp:  [18] 18 (11/13 0826) BP: (126)/(77)  126/77 (11/13 0826) SpO2:  [97 %] 97 % (11/13 0826) Weight:  [75.3 kg] 75.3 kg (11/13 0826)  General Appearance: Alert, cooperative, no distress, appears stated age Head: Normocephalic, without obvious abnormality, atraumatic Eyes: PERRL, conjunctiva/corneas clear, EOM's intact    Neck: Supple, symmetrical, trachea midline Back: Symmetric, no curvature, ROM normal, no  CVA tenderness Lungs:  respirations unlabored Heart: Regular rate and rhythm Abdomen: Soft, non-tender Extremities: Extremities normal, atraumatic, no cyanosis or edema Pulses: 2+ and symmetric all extremities Skin: Skin color, texture, turgor normal, no rashes or lesions  NEUROLOGIC:   Mental status: Alert and oriented x4,  no aphasia, good attention span, fund of knowledge, and memory Motor Exam - grossly normal Sensory Exam - grossly normal Reflexes: 1+ Coordination - grossly normal Gait - grossly normal Balance - grossly normal Cranial Nerves: I: smell Not tested  II: visual acuity  OS: nl    OD: nl  II: visual fields Full to confrontation  II: pupils Equal, round, reactive to light  III,VII: ptosis None  III,IV,VI: extraocular muscles  Full ROM  V: mastication Normal  V: facial light touch sensation  Normal  V,VII: corneal reflex  Present  VII: facial muscle function - upper  Normal  VII: facial muscle function - lower Normal  VIII: hearing Not tested  IX: soft palate elevation  Normal  IX,X: gag reflex Present  XI: trapezius strength  5/5  XI: sternocleidomastoid strength 5/5  XI: neck flexion strength  5/5  XII: tongue strength  Normal    Data Review Lab Results  Component Value Date   WBC 4.7 10/22/2022   HGB 14.2 10/22/2022   HCT 42.2 10/22/2022   MCV 92.0 10/22/2022   PLT 295.0 10/22/2022   Lab Results  Component Value Date   NA 138 10/22/2022   K 4.1 10/22/2022   CL 103 10/22/2022   CO2 25 10/22/2022   BUN 13 10/22/2022   CREATININE 0.64 10/22/2022   GLUCOSE 93 10/22/2022   No  results found for: "INR", "PROTIME"  Assessment/Plan:  Estimated body mass index is 32.42 kg/m as calculated from the following:   Height as of this encounter: 5' (1.524 m).   Weight as of this encounter: 75.3 kg. Patient admitted for L CTR. Patient has failed a reasonable attempt at conservative therapy.  I explained the condition and procedure to the patient and answered any questions.  Patient wishes to proceed with procedure as planned. Understands risks/ benefits and typical outcomes of procedure.   Eustace Moore 11/05/2022 10:15 AM

## 2022-11-05 NOTE — Anesthesia Postprocedure Evaluation (Signed)
Anesthesia Post Note  Patient: Tricia Potts  Procedure(s) Performed: Carpel Tunnel Release - left (Left: Wrist)     Patient location during evaluation: PACU Anesthesia Type: MAC Level of consciousness: awake and alert Pain management: pain level controlled Vital Signs Assessment: post-procedure vital signs reviewed and stable Respiratory status: spontaneous breathing, nonlabored ventilation and respiratory function stable Cardiovascular status: stable and blood pressure returned to baseline Postop Assessment: no apparent nausea or vomiting Anesthetic complications: no  No notable events documented.  Last Vitals:  Vitals:   11/05/22 1130 11/05/22 1145  BP: 113/68 122/85  Pulse: 61 (!) 56  Resp: 18 18  Temp:  36.7 C  SpO2: 97% 94%    Last Pain:  Vitals:   11/05/22 1145  TempSrc:   PainSc: 0-No pain                 Wynne Rozak,W. EDMOND

## 2022-11-05 NOTE — Anesthesia Procedure Notes (Signed)
Procedure Name: MAC Date/Time: 11/05/2022 10:35 AM  Performed by: Valda Favia, CRNAPre-anesthesia Checklist: Patient identified, Emergency Drugs available, Patient being monitored, Suction available and Timeout performed Patient Re-evaluated:Patient Re-evaluated prior to induction Oxygen Delivery Method: Simple face mask Placement Confirmation: positive ETCO2 Dental Injury: Teeth and Oropharynx as per pre-operative assessment

## 2022-11-05 NOTE — Discharge Summary (Signed)
Physician Discharge Summary  Patient ID: Tricia Potts MRN: [id number] DOB/AGE: 53-Aug-1972 53 y.o.  Admit date: 11/05/2022 Discharge date: 11/05/2022  Admission Diagnoses: Left carpal tunnel syndrome    Discharge Diagnoses: same   Discharged Condition: good  Hospital Course: The patient was admitted on 11/05/2022 and taken to the operating room where the patient underwent L CTR. The patient tolerated the procedure well and was taken to the recovery room and then to the pacu in stable condition. The hospital course was routine. There were no complications. The wound remained clean dry and intact. Pt had appropriate hand soreness. No complaints pain or new N/T/W. The patient remained afebrile with stable vital signs, and tolerated a regular diet. The patient continued to increase activities, and pain was well controlled with oral pain medications.   Consults: None  Significant Diagnostic Studies:  Results for orders placed or performed during the hospital encounter of 11/05/22  Pregnancy, urine POC  Result Value Ref Range   Preg Test, Ur NEGATIVE NEGATIVE    No results found.  Antibiotics:  Anti-infectives (From admission, onward)    Start     Dose/Rate Route Frequency Ordered Stop   11/05/22 0815  ceFAZolin (ANCEF) IVPB 2g/100 mL premix        2 g 200 mL/hr over 30 Minutes Intravenous On call to O.R. 11/05/22 0810 11/05/22 1052       Discharge Exam: Blood pressure 119/79, pulse 62, temperature 98.1 F (36.7 C), resp. rate 16, height 5' (1.524 m), weight 75.3 kg, SpO2 97 %. Neurologic: Grossly normal Dressing dry  Discharge Medications:   Allergies as of 11/05/2022   No Known Allergies      Medication List     TAKE these medications    albuterol 108 (90 Base) MCG/ACT inhaler Commonly known as: VENTOLIN HFA Inhale 2 puffs into the lungs every 6 (six) hours as needed for wheezing or shortness of breath.   ALPRAZolam 0.25 MG tablet Commonly known as:  XANAX Take 1 tablet by mouth 2 (two) times daily as needed for anxiety.   atorvastatin 20 MG tablet Commonly known as: LIPITOR Take 1 tablet (20 mg total) by mouth at bedtime.   CALCIUM CITRATE PO Take 2 tablets by mouth daily.   clindamycin 1 % external solution Commonly known as: CLEOCIN T Apply a small amount to skin 2 - 3 times a week for acne   doxycycline 100 MG capsule Commonly known as: VIBRAMYCIN Take 1 capsule (100 mg total) by mouth daily as needed.   Dupixent 300 MG/2ML Sopn Generic drug: Dupilumab Inject 1 pen under the skin every other week as directed   Dupixent 300 MG/2ML Sopn Generic drug: Dupilumab inject one pen subcutaneously evrey other week   EpiCeram lotion Apply 1 Application topically 2 (two) times daily as needed (atopic dermatitis).   Eucrisa 2 % Oint Generic drug: Crisaborole APPLY TO FINGERTIPS 2 TIMES DAILY.   famotidine 40 MG tablet Commonly known as: PEPCID Take 1 tablet (40 mg total) by mouth at bedtime.   fluticasone 50 MCG/ACT nasal spray Commonly known as: FLONASE PLACE 2 SPRAYS INTO EACH NOSTRIL DAILY What changed:  how much to take how to take this when to take this reasons to take this   HYDROcodone-acetaminophen 5-325 MG tablet Commonly known as: NORCO/VICODIN Take 1 tablet by mouth every 4 (four) hours as needed for moderate pain.   hydrocortisone cream 1 % Apply 1 application topically as needed for itching.   ibuprofen 200 MG tablet  Commonly known as: ADVIL Take 400-800 mg by mouth every 6 (six) hours as needed for headache or mild pain.   montelukast 10 MG tablet Commonly known as: SINGULAIR Take 1 tablet (10 mg total) by mouth at bedtime as needed.   MULTI-VITAMIN DAILY PO Take 1 tablet by mouth daily.   olopatadine 0.1 % ophthalmic solution Commonly known as: PATANOL PLACE 1 DROP INTO BOTH EYES 2 TIMES DAILY. What changed: See the new instructions.   OVER THE COUNTER MEDICATION Place 1 drop into both  eyes as needed (allergy symptoms). OTC eye drops  Systane/Artificial Tears/Homeopathic eye drops   PROBIOTIC DAILY PO Take 1 capsule by mouth daily.   SM Fexofenadine HCl 180 MG tablet Generic drug: fexofenadine TAKE 1 TABLET BY MOUTH DAILY.   tacrolimus 0.1 % ointment Commonly known as: PROTOPIC Apply 1 Application topically 2 (two) times daily.   venlafaxine XR 75 MG 24 hr capsule Commonly known as: EFFEXOR-XR Take 1 capsule (75 mg total) by mouth daily with breakfast.   Vitamin D 50 MCG (2000 UT) Caps Take 2,000 Units by mouth daily.        Disposition: home   Final Dx: L CTR  Discharge Instructions     Call MD for:  persistant nausea and vomiting   Complete by: As directed    Call MD for:  redness, tenderness, or signs of infection (pain, swelling, redness, odor or green/yellow discharge around incision site)   Complete by: As directed    Call MD for:  severe uncontrolled pain   Complete by: As directed    Call MD for:  temperature >100.4   Complete by: As directed    Diet - low sodium heart healthy   Complete by: As directed    Incentive spirometry RT   Complete by: As directed    Increase activity slowly   Complete by: As directed         Follow-up Information     Eustace Moore, MD Follow up.   Specialty: Neurosurgery Contact information: [address] [place] [address] [phone]                  Signed: Eustace Moore 11/05/2022, 11:23 AM

## 2022-11-05 NOTE — Transfer of Care (Signed)
Immediate Anesthesia Transfer of Care Note  Patient: Tricia Potts  Procedure(s) Performed: Carpel Tunnel Release - left (Left: Wrist)  Patient Location: PACU  Anesthesia Type:MAC  Level of Consciousness: awake and alert   Airway & Oxygen Therapy: Patient Spontanous Breathing and Patient connected to face mask oxygen  Post-op Assessment: Report given to RN and Post -op Vital signs reviewed and stable  Post vital signs: Reviewed and stable  Last Vitals:  Vitals Value Taken Time  BP 119/79 11/05/22 1120  Temp 36.7C 11/05/22 1120  Pulse 62 11/05/22 1119  Resp 16 11/05/22 1119  SpO2 97 % 11/05/22 1119  Vitals shown include unvalidated device data.  Last Pain:  Vitals:   11/05/22 0837  TempSrc:   PainSc: 0-No pain      Patients Stated Pain Goal: 0 (82/95/62 1308)  Complications: No notable events documented.

## 2022-11-05 NOTE — Op Note (Signed)
11/05/2022  11:18 AM  PATIENT:  Tricia Potts  51 y.o. female  PRE-OPERATIVE DIAGNOSIS: Left carpal tunnel syndrome  POST-OPERATIVE DIAGNOSIS:  same  PROCEDURE: Left carpal tunnel release  SURGEON:  Sherley Bounds, MD  ASSISTANTS: Glenford Peers FNP  ANESTHESIA:   General  EBL: Minimal ml  Total I/O In: 800 [I.V.:700; IV Piggyback:100] Out: 10 [Blood:10]  BLOOD ADMINISTERED: none  DRAINS: None  SPECIMEN:  none  INDICATION FOR PROCEDURE: This patient presented with Tingling in her hands.  Nerve conduction studies showed bilateral median neuropathy at the wrist. The patient tried conservative measures without relief. Pain was debilitating. Recommended bilateral carpal tunnel release starting on the left-hand side today. Patient understood the risks, benefits, and alternatives and potential outcomes and wished to proceed.  PROCEDURE DETAILS: The patient was taken to the operating room and placed in the supine position operative table with her left arm extended on an armboard.  We used local MAC anesthesia.  Her arm was prepped circumferentially from the fingertips to the elbow with DuraPrep and then draped in the usual sterile fashion.  10 cc of local anesthesia was injected and a small mark incision was made from the distal wrist crease into the palm in line with the webspace between the third and fourth digits.  Dissected down through the palmar fascia and identified the transverse carpal ligament was opened with a 15 blade scalpel.  We then spread between the ligament and the nerve with a hemostat to completely transected the ligament both proximally and distally.  The nerve was then free.  We palpated both proximally and distally with a hemostat to assure decompression of the nerve.  We dried all bleeding points.  We irrigated with saline solution.  We closed the palmar fascia with a single 3-0 Vicryl.  We closed the subcuticular tissue with 3-0 Vicryl.  We closed the skin with  interrupted 4-0 Ethilon vertical mattress sutures.  The hand was then cleaned and then capped and a Kerlix and Ace bandage.  She was awakened and taken to the recovery in stable condition.  At the end of the procedure all sponge needle and instrument counts were correct.   PLAN OF CARE: Discharge to home after PACU  PATIENT DISPOSITION:  PACU - hemodynamically stable.   Delay start of Pharmacological VTE agent (>24hrs) due to surgical blood loss or risk of bleeding:  yes

## 2022-11-06 ENCOUNTER — Encounter (HOSPITAL_COMMUNITY): Payer: Self-pay | Admitting: Neurological Surgery

## 2022-11-13 ENCOUNTER — Encounter: Payer: Self-pay | Admitting: Nurse Practitioner

## 2022-11-13 ENCOUNTER — Ambulatory Visit (INDEPENDENT_AMBULATORY_CARE_PROVIDER_SITE_OTHER): Payer: No Typology Code available for payment source | Admitting: Nurse Practitioner

## 2022-11-13 ENCOUNTER — Other Ambulatory Visit (HOSPITAL_COMMUNITY): Payer: Self-pay

## 2022-11-13 VITALS — BP 124/72 | HR 83 | Ht 63.0 in | Wt 168.6 lb

## 2022-11-13 DIAGNOSIS — J069 Acute upper respiratory infection, unspecified: Secondary | ICD-10-CM

## 2022-11-13 DIAGNOSIS — G4719 Other hypersomnia: Secondary | ICD-10-CM

## 2022-11-13 NOTE — Patient Instructions (Addendum)
Given your symptoms, I am concerned that you may have sleep disordered breathing with obstructive sleep apnea. I have ordered a home sleep study for further evaluation. Someone from our office will contact you to schedule this in about 12 weeks. If you have not heard anything at this point, please call us.   We discussed how untreated sleep apnea puts an individual at risk for cardiac arrhthymias, pulm HTN, DM, stroke and increases their risk for daytime accidents. We also briefly reviewed treatment options including weight loss, side sleeping position, oral appliance, CPAP therapy or referral to ENT for possible surgical options.   Caution when driving; pull over if you become sleepy.  Follow up in 14 weeks with Tricia Syrianna Schillaci,NP to discuss sleep study results, or sooner if needed

## 2022-11-13 NOTE — Progress Notes (Unsigned)
$'@Patient'Y$  ID: Tricia Potts, female    DOB: [birth year], 51 y.o.   MRN: [id number]  Chief Complaint  Patient presents with   Consult    Pt consult, reports she snores ,has been told she stops breathing, and fatigue.     Referring provider: Mosie Lukes, MD  HPI: 50 year old female, never smoker referred for sleep consult.  Past medical history significant for allergic rhinitis, asthma, anxiety, depression, HLD.   TEST/EVENTS:   11/13/2022: Today - sleep consult Patient presents today for a sleep consult referred by Dr. Charlett Blake, her PCP. Her husband is with her; he is also a sleep patient of our's. She has had a longstanding history of trouble sleeping and feeling very fatigued.  She feels poorly rested upon waking.  She also wakes herself up snoring.  Her husband tells her that she stops breathing at night multiple times.  Denies any morning headaches, drowsy driving, sleep parasomnia/paralysis.  No history of narcolepsy or cataplexy. She goes to bed between 9 to 11 PM.  Falls asleep within 30 minutes.  Wakes 2-8 times at night.  Officially without bed in the morning around 6 to 8 AM.  She works as a Hotel manager.  Does not operate any heavy machinery.  She is gained about 5 to 10 pounds in the last 2 years.  No sleep aids. No previous sleep studies.  No cardiac history.  No history of stroke.  She is never smoker.  Does not drink any alcohol.  Lives at home with her husband.  Family history of allergies, asthma and cancer.  She does have asthma. Well-controlled currently. She has a head cold right now but denies any increased shortness of breath, chest tightness or wheezing. Denies fevers or chills.   Epworth 12  No Known Allergies  Immunization History  Administered Date(s) Administered   Influenza Whole 09/23/2012, 09/30/2013   Influenza,inj,Quad PF,6+ Mos 09/19/2018   Influenza,inj,Quad PF,6-35 Mos 10/09/2021   Influenza-Unspecified 09/23/2014, 09/26/2015    Moderna Sars-Covid-2 Vaccination 11/23/2020   PFIZER(Purple Top)SARS-COV-2 Vaccination 02/01/2020, 12/13/2020   PNEUMOCOCCAL CONJUGATE-20 06/01/2021   Pneumococcal Conjugate-13 10/25/2014   Td 03/07/2010   Tdap 06/21/2014   Zoster Recombinat (Shingrix) 06/10/2020, 06/29/2022    Past Medical History:  Diagnosis Date   Allergy    Anemia 06/18/2014   Anxiety    Anxiety and depression 02/17/2009   Qualifier: Diagnosis of  By: Redmond Pulling MD, LauraLee     Asthma    Broken ankle 2011   (left) roller skating   Bronchitis, mucopurulent recurrent ([facility]) 04/21/2013   Cervical cancer screening 02/20/2016   Depression    GERD (gastroesophageal reflux disease)    Hemorrhoid    History of hidradenitis suppurativa    Hyperlipemia    IBS (irritable bowel syndrome)    Kidney infection    as a child   Low back pain 05/25/2016   Osteopenia    PCO (polycystic ovaries)    Pneumonia    Pruritus 08/22/2017    Tobacco History: Social History   Tobacco Use  Smoking Status Never  Smokeless Tobacco Never   Counseling given: Not Answered   Outpatient Medications Prior to Visit  Medication Sig Dispense Refill   albuterol (VENTOLIN HFA) 108 (90 Base) MCG/ACT inhaler Inhale 2 puffs into the lungs every 6 (six) hours as needed for wheezing or shortness of breath. 18 g 2   ALPRAZolam (XANAX) 0.25 MG tablet Take 1 tablet by mouth 2 (two) times daily as  needed for anxiety. 40 tablet 1   atorvastatin (LIPITOR) 20 MG tablet Take 1 tablet (20 mg total) by mouth at bedtime. 90 tablet 1   CALCIUM CITRATE PO Take 2 tablets by mouth daily.     Cholecalciferol (VITAMIN D) 50 MCG (2000 UT) CAPS Take 2,000 Units by mouth daily.     clindamycin (CLEOCIN T) 1 % external solution Apply a small amount to skin 2 - 3 times a week for acne 60 mL 5   Crisaborole 2 % OINT APPLY TO FINGERTIPS 2 TIMES DAILY. 100 g 3   Dermatological Products, Misc. [facility]) lotion Apply 1 Application topically 2 (two) times daily as  needed (atopic dermatitis).     doxycycline (VIBRAMYCIN) 100 MG capsule Take 1 capsule (100 mg total) by mouth daily as needed. 30 capsule 1   Dupilumab (DUPIXENT) 300 MG/2ML SOPN Inject 1 pen under the skin every other week as directed 4 mL 2   Dupilumab (DUPIXENT) 300 MG/2ML SOPN inject one pen subcutaneously evrey other week 4 mL 4   famotidine (PEPCID) 40 MG tablet Take 1 tablet (40 mg total) by mouth at bedtime. 90 tablet 1   fexofenadine (SM FEXOFENADINE HCL) 180 MG tablet TAKE 1 TABLET BY MOUTH DAILY. 90 tablet 1   fluticasone (FLONASE) 50 MCG/ACT nasal spray PLACE 2 SPRAYS INTO EACH NOSTRIL DAILY (Patient taking differently: Place 2 sprays into both nostrils daily as needed for allergies.) 48 g 1   HYDROcodone-acetaminophen (NORCO/VICODIN) 5-325 MG tablet Take 1 tablet by mouth every 4 (four) hours as needed for moderate pain. 20 tablet 0   hydrocortisone cream 1 % Apply 1 application topically as needed for itching.     ibuprofen (ADVIL,MOTRIN) 200 MG tablet Take 400-800 mg by mouth every 6 (six) hours as needed for headache or mild pain.     montelukast (SINGULAIR) 10 MG tablet Take 1 tablet (10 mg total) by mouth at bedtime as needed. 90 tablet 3   Multiple Vitamin (MULTI-VITAMIN DAILY PO) Take 1 tablet by mouth daily.     olopatadine (PATANOL) 0.1 % ophthalmic solution PLACE 1 DROP INTO BOTH EYES 2 TIMES DAILY. (Patient taking differently: Place 1 drop into both eyes 2 (two) times daily as needed for allergies.) 5 mL 12   OVER THE COUNTER MEDICATION Place 1 drop into both eyes as needed (allergy symptoms). OTC eye drops  Systane/Artificial Tears/Homeopathic eye drops     Probiotic Product (PROBIOTIC DAILY PO) Take 1 capsule by mouth daily.     tacrolimus (PROTOPIC) 0.1 % ointment Apply 1 Application topically 2 (two) times daily.     venlafaxine XR (EFFEXOR-XR) 75 MG 24 hr capsule Take 1 capsule (75 mg total) by mouth daily with breakfast. 90 capsule 1   No facility-administered  medications prior to visit.     Review of Systems:   Constitutional: No weight loss or gain, night sweats, fevers, chills, or lassitude. +excessive daytime fatigue HEENT: No headaches, difficulty swallowing, tooth/dental problems, or sore throat. No sneezing, itching, ear ache. +nasal congestion/drainage CV:  No chest pain, orthopnea, PND, swelling in lower extremities, anasarca, dizziness, palpitations, syncope Resp: +snoring, witnessed nocturnal apneas; cough. No shortness of breath with exertion or at rest. No excess mucus or change in color of mucus. No hemoptysis. No wheezing.  No chest wall deformity GI:  No heartburn, indigestion, abdominal pain, nausea, vomiting, diarrhea, change in bowel habits, loss of appetite, bloody stools.  GU: No dysuria, change in color of urine, urgency or frequency. Skin:  No rash, lesions, ulcerations MSK:  No joint pain or swelling.   Neuro: No dizziness or lightheadedness.  Psych: No depression or anxiety. Mood stable.     Physical Exam:  BP 124/72   Pulse 83   Ht '5\' 3"'$  (1.6 m)   Wt 168 lb 9.6 oz (76.5 kg)   LMP  (LMP Unknown)   SpO2 97%   BMI 29.87 kg/m   GEN: Pleasant, interactive, well-appearing; in no acute distress. HEENT:  Normocephalic and atraumatic. PERRLA. Sclera white. Nasal turbinates pink, moist and patent bilaterally. No rhinorrhea present. Oropharynx erythematous and moist, without exudate or edema. No lesions, ulcerations, or postnasal drip. Mallampati III/IV NECK:  Supple w/ fair ROM. No JVD present. Normal carotid impulses w/o bruits. Thyroid symmetrical with no goiter or nodules palpated. No lymphadenopathy.   CV: RRR, no m/r/g, no peripheral edema. Pulses intact, +2 bilaterally. No cyanosis, pallor or clubbing. PULMONARY:  Unlabored, regular breathing. Clear bilaterally A&P w/o wheezes/rales/rhonchi. No accessory muscle use.  GI: BS present and normoactive. Soft, non-tender to palpation. No organomegaly or masses detected.   MSK: No erythema, warmth or tenderness. Cap refil <2 sec all extrem. No deformities or joint swelling noted.  Neuro: A/Ox3. No focal deficits noted.   Skin: Warm, no lesions or rashe Psych: Normal affect and behavior. Judgement and thought content appropriate.     Lab Results:  CBC    Component Value Date/Time   WBC 4.7 10/22/2022 0901   RBC 4.58 10/22/2022 0901   HGB 14.2 10/22/2022 0901   HGB 14.5 01/06/2020 0000   HCT 42.2 10/22/2022 0901   HCT 42.0 01/06/2020 0000   PLT 295.0 10/22/2022 0901   PLT 263 01/06/2020 0000   MCV 92.0 10/22/2022 0901   MCV 87 01/06/2020 0000   MCH 30.1 01/06/2020 0000   MCH 27.9 05/31/2014 0913   MCHC 33.7 10/22/2022 0901   RDW 12.8 10/22/2022 0901   RDW 13.0 01/06/2020 0000   LYMPHSABS 1.6 06/01/2021 0903   MONOABS 0.3 06/01/2021 0903   EOSABS 0.2 06/01/2021 0903   BASOSABS 0.0 06/01/2021 0903    BMET    Component Value Date/Time   NA 138 10/22/2022 0901   K 4.1 10/22/2022 0901   CL 103 10/22/2022 0901   CO2 25 10/22/2022 0901   GLUCOSE 93 10/22/2022 0901   BUN 13 10/22/2022 0901   CREATININE 0.64 10/22/2022 0901   CREATININE 0.63 05/31/2014 0913   CALCIUM 9.6 10/22/2022 0901   CALCIUM 9.7 04/07/2010 0000   GFRNONAA 117.65 03/18/2009 0942    BNP No results found for: "BNP"   Imaging:  No results found.        No data to display          No results found for: "NITRICOXIDE"      Assessment & Plan:   Excessive daytime sleepiness She has snoring, excessive daytime sleepiness, nocturnal apneic events. Epworth 12. Given this,  I am concerned she could have sleep disordered breathing with obstructive sleep apnea. She will need sleep study for further evaluation.    - discussed how weight can impact sleep and risk for sleep disordered breathing - discussed options to assist with weight loss: combination of diet modification, cardiovascular and strength training exercises   - had an extensive discussion regarding  the adverse health consequences related to untreated sleep disordered breathing - specifically discussed the risks for hypertension, coronary artery disease, cardiac dysrhythmias, cerebrovascular disease, and diabetes - lifestyle modification discussed   - discussed how  sleep disruption can increase risk of accidents, particularly when driving - safe driving practices were discussed   Patient Instructions  Given your symptoms, I am concerned that you may have sleep disordered breathing with obstructive sleep apnea. I have ordered a home sleep study for further evaluation. Someone from our office will contact you to schedule this in about 12 weeks. If you have not heard anything at this point, please call us.   We discussed how untreated sleep apnea puts an individual at risk for cardiac arrhthymias, pulm HTN, DM, stroke and increases their risk for daytime accidents. We also briefly reviewed treatment options including weight loss, side sleeping position, oral appliance, CPAP therapy or referral to ENT for possible surgical options.   Caution when driving; pull over if you become sleepy.  Follow up in 14 weeks with Katie Deaaron Fulghum,NP to discuss sleep study results, or sooner if needed    URI (upper respiratory infection) URI symptoms. Asthma seems well controlled without acute exacerbation. COVID negative. Slowly improving. Supportive care measures advised.   I spent 35 minutes of dedicated to the care of this patient on the date of this encounter to include pre-visit review of records, face-to-face time with the patient discussing conditions above, post visit ordering of testing, clinical documentation with the electronic health record, making appropriate referrals as documented, and communicating necessary findings to members of the patients care team.  Clayton Bibles, NP 11/14/2022  Pt aware and understands NP's role.

## 2022-11-14 ENCOUNTER — Encounter: Payer: Self-pay | Admitting: Nurse Practitioner

## 2022-11-14 DIAGNOSIS — G4719 Other hypersomnia: Secondary | ICD-10-CM | POA: Insufficient documentation

## 2022-11-14 DIAGNOSIS — J069 Acute upper respiratory infection, unspecified: Secondary | ICD-10-CM | POA: Insufficient documentation

## 2022-11-14 NOTE — Assessment & Plan Note (Signed)
She has snoring, excessive daytime sleepiness, nocturnal apneic events. Epworth 12. Given this,  I am concerned she could have sleep disordered breathing with obstructive sleep apnea. She will need sleep study for further evaluation.    - discussed how weight can impact sleep and risk for sleep disordered breathing - discussed options to assist with weight loss: combination of diet modification, cardiovascular and strength training exercises   - had an extensive discussion regarding the adverse health consequences related to untreated sleep disordered breathing - specifically discussed the risks for hypertension, coronary artery disease, cardiac dysrhythmias, cerebrovascular disease, and diabetes - lifestyle modification discussed   - discussed how sleep disruption can increase risk of accidents, particularly when driving - safe driving practices were discussed   Patient Instructions  Given your symptoms, I am concerned that you may have sleep disordered breathing with obstructive sleep apnea. I have ordered a home sleep study for further evaluation. Someone from our office will contact you to schedule this in about 12 weeks. If you have not heard anything at this point, please call us.   We discussed how untreated sleep apnea puts an individual at risk for cardiac arrhthymias, pulm HTN, DM, stroke and increases their risk for daytime accidents. We also briefly reviewed treatment options including weight loss, side sleeping position, oral appliance, CPAP therapy or referral to ENT for possible surgical options.   Caution when driving; pull over if you become sleepy.  Follow up in 14 weeks with Tricia Casady Voshell,NP to discuss sleep study results, or sooner if needed

## 2022-11-14 NOTE — Assessment & Plan Note (Addendum)
URI symptoms. Asthma seems well controlled without acute exacerbation. COVID negative. Slowly improving. Supportive care measures advised.

## 2022-11-15 NOTE — Progress Notes (Signed)
Reviewed and agree with assessment/plan.   Chesley Mires, MD [facility] Pulmonary/Critical Care 11/15/2022, 8:14 PM Pager:  [phone]

## 2022-11-22 ENCOUNTER — Other Ambulatory Visit (HOSPITAL_COMMUNITY): Payer: Self-pay

## 2022-11-23 ENCOUNTER — Other Ambulatory Visit: Payer: Self-pay | Admitting: Neurological Surgery

## 2022-11-29 ENCOUNTER — Other Ambulatory Visit (HOSPITAL_COMMUNITY): Payer: Self-pay

## 2022-11-29 ENCOUNTER — Encounter (HOSPITAL_COMMUNITY): Payer: Self-pay | Admitting: Neurological Surgery

## 2022-11-29 MED ORDER — SPIRONOLACTONE 50 MG PO TABS
50.0000 mg | ORAL_TABLET | Freq: Every day | ORAL | 2 refills | Status: DC
  Filled 2022-11-29: qty 30, 30d supply, fill #0
  Filled 2022-12-21: qty 30, 30d supply, fill #1
  Filled 2023-01-27 – 2023-01-28 (×2): qty 30, 30d supply, fill #2

## 2022-11-29 NOTE — Progress Notes (Signed)
PCP - Dr Penni Homans Cardiologist - n/a  Chest x-ray - n/a EKG - n/a Stress Test - n/a ECHO - n/a Cardiac Cath - n/a  ICD Pacemaker/Loop - n/a  Sleep Study -  n/a CPAP - none  NPO after midnight tonight per MD order.  STOP now taking any Aspirin (unless otherwise instructed by your surgeon), Aleve, Naproxen, Ibuprofen, Motrin, Advil, Goody's, BC's, all herbal medications, fish oil, and all vitamins.   Coronavirus Screening Do you have any of the following symptoms:  Cough yes/no: No Fever (>100.51F)  yes/no: No Runny nose yes/no: No Sore throat yes/no: No Difficulty breathing/shortness of breath  yes/no: No  Have you traveled in the last 14 days and where? yes/no: No  Patient verbalized understanding of instructions that were given via phone.

## 2022-11-30 ENCOUNTER — Ambulatory Visit (HOSPITAL_COMMUNITY)
Admission: RE | Admit: 2022-11-30 | Payer: No Typology Code available for payment source | Source: Ambulatory Visit | Admitting: Neurological Surgery

## 2022-11-30 ENCOUNTER — Other Ambulatory Visit (HOSPITAL_COMMUNITY): Payer: Self-pay

## 2022-11-30 SURGERY — CARPAL TUNNEL RELEASE
Anesthesia: Monitor Anesthesia Care | Laterality: Right

## 2022-11-30 MED ORDER — HYDROCODONE-ACETAMINOPHEN 5-325 MG PO TABS
1.0000 | ORAL_TABLET | Freq: Four times a day (QID) | ORAL | 0 refills | Status: DC | PRN
  Filled 2022-11-30: qty 20, 5d supply, fill #0

## 2022-12-02 NOTE — Progress Notes (Unsigned)
New Patient Note  RE: SIVAN CUELLO MRN: [id number] DOB: 01-Dec-1971 Date of Office Visit: 12/03/2022  Consult requested by: Mosie Lukes, MD Primary care provider: Mosie Lukes, MD  Chief Complaint: No chief complaint on file.  History of Present Illness: I had the pleasure of seeing Terie Lear for initial evaluation at the [facility] on 12/02/2022. She is a 51 y.o. female, who is referred here by Mosie Lukes, MD for the evaluation of ***.  ***  Assessment and Plan: Matika is a 51 y.o. female with: No problem-specific Assessment & Plan notes found for this encounter.  No follow-ups on file.  No orders of the defined types were placed in this encounter.  Lab Orders  No laboratory test(s) ordered today    Other allergy screening: Asthma: {Blank single:19197::"yes","no"} Rhino conjunctivitis: {Blank single:19197::"yes","no"} Food allergy: {Blank single:19197::"yes","no"} Medication allergy: {Blank single:19197::"yes","no"} Hymenoptera allergy: {Blank single:19197::"yes","no"} Urticaria: {Blank single:19197::"yes","no"} Eczema:{Blank single:19197::"yes","no"} History of recurrent infections suggestive of immunodeficency: {Blank single:19197::"yes","no"}  Diagnostics: Spirometry:  Tracings reviewed. Her effort: {Blank single:19197::"Good reproducible efforts.","It was hard to get consistent efforts and there is a question as to whether this reflects a maximal maneuver.","Poor effort, data can not be interpreted."} FVC: ***L FEV1: ***L, ***% predicted FEV1/FVC ratio: ***% Interpretation: {Blank single:19197::"Spirometry consistent with mild obstructive disease","Spirometry consistent with moderate obstructive disease","Spirometry consistent with severe obstructive disease","Spirometry consistent with possible restrictive disease","Spirometry consistent with mixed obstructive and restrictive disease","Spirometry uninterpretable due to  technique","Spirometry consistent with normal pattern","No overt abnormalities noted given today's efforts"}.  Please see scanned spirometry results for details.  Skin Testing: {Blank single:19197::"Select foods","Environmental allergy panel","Environmental allergy panel and select foods","Food allergy panel","None","Deferred due to recent antihistamines use"}. *** Results discussed with patient/family.   Past Medical History: Patient Active Problem List   Diagnosis Date Noted  . Excessive daytime sleepiness 11/14/2022  . URI (upper respiratory infection) 11/14/2022  . CTS (carpal tunnel syndrome) 10/22/2022  . Foot pain, bilateral 06/05/2022  . Musculoskeletal neck pain 06/05/2022  . Post-menopausal bleeding 04/15/2022  . Pelvic pain 04/13/2022  . Epiretinal membrane 04/20/2020  . Low vitamin D level 04/01/2020  . Hyperglycemia 02/14/2020  . Osteopenia 11/02/2019  . Acid reflux 11/02/2019  . Perimenopause 04/06/2019  . Low back pain 05/25/2016  . Cervical cancer screening 02/20/2016  . Anemia 06/18/2014  . Preventative health care 04/23/2013  . Asthma 01/22/2011  . Hyperlipidemia, mixed 02/17/2009  . Anxiety and depression 02/17/2009  . HEMORRHOIDS 02/17/2009  . Allergic rhinitis 02/17/2009  . Bronchitis with asthma, subacute 02/17/2009  . IRRITABLE BOWEL SYNDROME 02/17/2009  . Dermatitis 02/17/2009   Past Medical History:  Diagnosis Date  . Allergy   . Anemia 06/18/2014  . Anxiety   . Anxiety and depression 02/17/2009   Qualifier: Diagnosis of  By: Redmond Pulling MD, Frann Rider    . Asthma    mild - rarely uses inhaler  . Broken ankle 2011   (left) roller skating  . Bronchitis, mucopurulent recurrent ([place]) 04/21/2013  . Cervical cancer screening 02/20/2016  . Depression   . GERD (gastroesophageal reflux disease)   . Hemorrhoid   . History of hidradenitis suppurativa   . Hyperlipemia   . IBS (irritable bowel syndrome)   . Kidney infection    as a child  . Low back  pain 05/25/2016  . Osteopenia   . PCO (polycystic ovaries)   . Pneumonia   . Pruritus 08/22/2017   Past Surgical History: Past Surgical History:  Procedure Laterality Date  .  ANKLE SURGERY Left    plate and 8 screws in left ankle  . CARPAL TUNNEL RELEASE Left 11/05/2022   Procedure: Carpel Tunnel Release - left;  Surgeon: Eustace Moore, MD;  Location: [facility];  Service: Neurosurgery;  Laterality: Left;  . EYE SURGERY     Lasik  . HEMORRHOID SURGERY    . WISDOM TOOTH EXTRACTION     Medication List:  Current Outpatient Medications  Medication Sig Dispense Refill  . acetaminophen (TYLENOL) 500 MG tablet Take 1,000 mg by mouth in the morning and at bedtime.    Marland Kitchen albuterol (VENTOLIN HFA) 108 (90 Base) MCG/ACT inhaler Inhale 2 puffs into the lungs every 6 (six) hours as needed for wheezing or shortness of breath. 18 g 2  . ALPRAZolam (XANAX) 0.25 MG tablet Take 1 tablet by mouth 2 (two) times daily as needed for anxiety. 40 tablet 1  . atorvastatin (LIPITOR) 20 MG tablet Take 1 tablet (20 mg total) by mouth at bedtime. 90 tablet 1  . Brimonidine Tartrate (LUMIFY) 0.025 % SOLN Place 1 drop into both eyes 2 (two) times daily as needed (eye irritation.).    Marland Kitchen CALCIUM CITRATE PO Take 600 mg by mouth in the morning.    . Cholecalciferol (VITAMIN D) 50 MCG (2000 UT) CAPS Take 2,000 Units by mouth in the morning.    . clindamycin (CLEOCIN T) 1 % external solution Apply a small amount to skin 2 - 3 times a week for acne (Patient taking differently: Apply 1 Application topically daily as needed (acne).) 60 mL 5  . Crisaborole 2 % OINT APPLY TO FINGERTIPS 2 TIMES DAILY. 100 g 3  . Dermatological Products, Misc. [facility]) lotion Apply 1 Application topically in the morning and at bedtime.    Marland Kitchen doxycycline (VIBRAMYCIN) 100 MG capsule Take 1 capsule (100 mg total) by mouth daily as needed. (Patient not taking: Reported on 11/27/2022) 30 capsule 1  . Dupilumab (DUPIXENT) 300 MG/2ML SOPN Inject 1 pen under  the skin every other week as directed 4 mL 2  . Dupilumab (DUPIXENT) 300 MG/2ML SOPN inject one pen subcutaneously evrey other week (Patient not taking: Reported on 11/27/2022) 4 mL 4  . famotidine (PEPCID) 40 MG tablet Take 1 tablet (40 mg total) by mouth at bedtime. 90 tablet 1  . fexofenadine (SM FEXOFENADINE HCL) 180 MG tablet TAKE 1 TABLET BY MOUTH DAILY. (Patient taking differently: Take 180 mg by mouth at bedtime.) 90 tablet 1  . fluticasone (FLONASE) 50 MCG/ACT nasal spray PLACE 2 SPRAYS INTO EACH NOSTRIL DAILY 48 g 1  . HYDROcodone-acetaminophen (NORCO/VICODIN) 5-325 MG tablet Take 1 tablet by mouth every 6 (six) hours as needed for pain. 20 tablet 0  . hydroxypropyl methylcellulose / hypromellose (ISOPTO TEARS / GONIOVISC) 2.5 % ophthalmic solution Place 1 drop into both eyes 3 (three) times daily as needed for dry eyes.    Marland Kitchen ibuprofen (ADVIL,MOTRIN) 200 MG tablet Take 400-800 mg by mouth every 6 (six) hours as needed for headache or mild pain.    Marland Kitchen levocetirizine (XYZAL) 5 MG tablet Take 5 mg by mouth in the morning.    . montelukast (SINGULAIR) 10 MG tablet Take 1 tablet (10 mg total) by mouth at bedtime as needed. (Patient taking differently: Take 10 mg by mouth at bedtime.) 90 tablet 3  . Multiple Vitamin (MULTI-VITAMIN DAILY PO) Take 1 tablet by mouth in the morning.    . naphazoline-pheniramine (ALLERGY EYE) 0.025-0.3 % ophthalmic solution Place 1 drop into both  eyes 3 (three) times daily as needed for eye irritation or allergies.    . Probiotic Product (PROBIOTIC DAILY PO) Take 1 capsule by mouth in the morning. Women's Probiotic    . spironolactone (ALDACTONE) 50 MG tablet Take 1 tablet (50 mg total) by mouth daily. 30 tablet 2  . venlafaxine XR (EFFEXOR-XR) 75 MG 24 hr capsule Take 1 capsule (75 mg total) by mouth daily with breakfast. 90 capsule 1   No current facility-administered medications for this visit.   Allergies: No Known Allergies Social History: Social History    Socioeconomic History  . Marital status: Married    Spouse name: Not on file  . Number of children: 3  . Years of education: Not on file  . Highest education level: Not on file  Occupational History  . Occupation: Programmer, multimedia: [employer]  Tobacco Use  . Smoking status: Never  . Smokeless tobacco: Never  Vaping Use  . Vaping Use: Never used  Substance and Sexual Activity  . Alcohol use: No  . Drug use: No  . Sexual activity: Yes    Partners: Male    Birth control/protection: Post-menopausal  Other Topics Concern  . Not on file  Social History Narrative   Works in interventional radiology-RN [employer]   Married   3 daughters   Social Determinants of Health   Financial Resource Strain: Not on file  Food Insecurity: Not on file  Transportation Needs: Not on file  Physical Activity: Not on file  Stress: Not on file  Social Connections: Not on file   Lives in a ***. Smoking: *** Occupation: ***  Environmental HistoryFreight forwarder in the house: Estate agent in the family room: {Blank single:19197::"yes","no"} Carpet in the bedroom: {Blank single:19197::"yes","no"} Heating: {Blank single:19197::"electric","gas","heat pump"} Cooling: {Blank single:19197::"central","window","heat pump"} Pet: {Blank single:19197::"yes ***","no"}  Family History: Family History  Problem Relation Age of Onset  . Allergies Mother   . COPD Mother   . Irritable bowel syndrome Mother   . Heart disease Father        mitral valve disease/rupture during physical stress  . GER disease Father   . Irritable bowel syndrome Father   . Heart failure Father   . Hepatitis C Brother   . Cancer Brother 17       ALL  . Heart disease Daughter        asd s/p repair at age 68  . Anxiety disorder Daughter   . Cancer Maternal Grandmother        ovarian cancer  . Heart disease Maternal Grandfather        MI at 54  . Kidney disease Paternal Grandfather         possible kidney cancer  . Allergies Sister   . Eczema Sister   . Cancer Sister 25       breast, DCIS  . Allergies Daughter   . Kidney disease Paternal Grandmother   . Ovarian cancer Other        Grandmother  . Arthritis Other   . Irritable bowel syndrome Niece   . Colon cancer Neg Hx   . Rectal cancer Neg Hx   . Esophageal cancer Neg Hx   . Stomach cancer Neg Hx    Problem                               Relation Asthma                                   ***  Eczema                                *** Food allergy                          *** Allergic rhino conjunctivitis     ***  Review of Systems  Constitutional:  Negative for appetite change, chills, fever and unexpected weight change.  HENT:  Negative for congestion and rhinorrhea.   Eyes:  Negative for itching.  Respiratory:  Negative for cough, chest tightness, shortness of breath and wheezing.   Cardiovascular:  Negative for chest pain.  Gastrointestinal:  Negative for abdominal pain.  Genitourinary:  Negative for difficulty urinating.  Skin:  Negative for rash.  Neurological:  Negative for headaches.   Objective: LMP  (LMP Unknown)  There is no height or weight on file to calculate BMI. Physical Exam Vitals and nursing note reviewed.  Constitutional:      Appearance: Normal appearance. She is well-developed.  HENT:     Head: Normocephalic and atraumatic.     Right Ear: Tympanic membrane and external ear normal.     Left Ear: Tympanic membrane and external ear normal.     Nose: Nose normal.     Mouth/Throat:     Mouth: Mucous membranes are moist.     Pharynx: Oropharynx is clear.  Eyes:     Conjunctiva/sclera: Conjunctivae normal.  Cardiovascular:     Rate and Rhythm: Normal rate and regular rhythm.     Heart sounds: Normal heart sounds. No murmur heard.    No friction rub. No gallop.  Pulmonary:     Effort: Pulmonary effort is normal.     Breath sounds: Normal breath sounds. No wheezing, rhonchi or rales.   Musculoskeletal:     Cervical back: Neck supple.  Skin:    General: Skin is warm.     Findings: No rash.  Neurological:     Mental Status: She is alert and oriented to person, place, and time.  Psychiatric:        Behavior: Behavior normal.  The plan was reviewed with the patient/family, and all questions/concerned were addressed.  It was my pleasure to see Tricia Potts today and participate in her care. Please feel free to contact me with any questions or concerns.  Sincerely,  Rexene Alberts, DO Allergy & Immunology  [facility] office: [place] office: [phone]

## 2022-12-03 ENCOUNTER — Other Ambulatory Visit: Payer: Self-pay

## 2022-12-03 ENCOUNTER — Encounter (HOSPITAL_COMMUNITY): Payer: Self-pay

## 2022-12-03 ENCOUNTER — Ambulatory Visit (INDEPENDENT_AMBULATORY_CARE_PROVIDER_SITE_OTHER): Payer: No Typology Code available for payment source | Admitting: Allergy

## 2022-12-03 ENCOUNTER — Other Ambulatory Visit (HOSPITAL_COMMUNITY): Payer: Self-pay

## 2022-12-03 ENCOUNTER — Encounter: Payer: Self-pay | Admitting: Allergy

## 2022-12-03 VITALS — BP 126/72 | HR 61 | Temp 98.3°F | Resp 18 | Ht 60.75 in | Wt 167.7 lb

## 2022-12-03 DIAGNOSIS — J3089 Other allergic rhinitis: Secondary | ICD-10-CM

## 2022-12-03 DIAGNOSIS — J4520 Mild intermittent asthma, uncomplicated: Secondary | ICD-10-CM

## 2022-12-03 DIAGNOSIS — H1013 Acute atopic conjunctivitis, bilateral: Secondary | ICD-10-CM | POA: Insufficient documentation

## 2022-12-03 DIAGNOSIS — L2089 Other atopic dermatitis: Secondary | ICD-10-CM

## 2022-12-03 MED ORDER — RYALTRIS 665-25 MCG/ACT NA SUSP: 1.0000 | Freq: Two times a day (BID) | NASAL | 5 refills | Status: DC

## 2022-12-03 MED ORDER — AIRSUPRA 90-80 MCG/ACT IN AERO
2.0000 | INHALATION_SPRAY | RESPIRATORY_TRACT | 2 refills | Status: AC | PRN
  Filled 2022-12-03: qty 10.7, 10d supply, fill #0

## 2022-12-03 NOTE — Assessment & Plan Note (Signed)
Follows with dermatology. Today's skin testing showed: Positive to grass, weed, ragweed, trees, cat, dog, mold. Borderline to soy, sesame and milk. Monitor symptoms after you eat these foods.  See below for proper skin care. Continue recommendations as per your dermatologist.  Dupixent at home injections every 2 weeks.

## 2022-12-03 NOTE — Patient Instructions (Addendum)
Today's skin testing showed: Positive to grass, weed, ragweed, trees, cat, dog, mold. Borderline to soy, sesame and milk.  Monitor for symptoms after you eat these foods.   Results given.  Environmental allergies Start environmental control measures as below. Use over the counter antihistamines such as Zyrtec (cetirizine), Claritin (loratadine), Allegra (fexofenadine), or Xyzal (levocetirizine) daily as needed. May take twice a day during allergy flares. May switch antihistamines every few months. Continue Singulair (montelukast) '10mg'$  daily at night. Start Ryaltris (olopatadine + mometasone nasal spray combination) 1-2 sprays per nostril twice a day. Sample given. This replaces your other nasal sprays. If this works well for you, then have [facility] ship the medication to your home - prescription already sent in.  Nasal saline spray (i.e., Simply Saline) or nasal saline lavage (i.e., NeilMed) is recommended as needed and prior to medicated nasal sprays. May use over the counter eye drops as needed. Start allergy injections. Let us know when ready to start.  Had a detailed discussion with patient/family that clinical history is suggestive of allergic rhinitis, and may benefit from allergy immunotherapy (AIT). Discussed in detail regarding the dosing, schedule, side effects (mild to moderate local allergic reaction and rarely systemic allergic reactions including anaphylaxis), and benefits (significant improvement in nasal symptoms, seasonal flares of asthma) of immunotherapy with the patient. There is significant time commitment involved with allergy shots, which includes weekly immunotherapy injections for first 9-12 months and then biweekly to monthly injections for 3-5 years. Consent was signed.  Asthma Normal breathing test.  Daily controller medication(s): none.  May use Airsupra rescue inhaler 2 puffs every 4 to 6 hours as needed for shortness of breath, chest tightness, coughing, and  wheezing. Do not use more than 12 puffs in 24 hours. May use Airsupra rescue inhaler 2 puffs 5 to 15 minutes prior to strenuous physical activities. Monitor frequency of use. Rinse mouth after each use. If not covered then use albuterol as before.  Coupon given. Breathing control goals:  Full participation in all desired activities (may need albuterol before activity) Albuterol use two times or less a week on average (not counting use with activity) Cough interfering with sleep two times or less a month Oral steroids no more than once a year No hospitalizations   Eczema See below for proper skin care. Continue recommendations as per your dermatologist.   Follow up in 3 months or sooner if needed.    Reducing Pollen Exposure Pollen seasons: trees (spring), grass (summer) and ragweed/weeds (fall). Keep windows closed in your home and car to lower pollen exposure.  Install air conditioning in the bedroom and throughout the house if possible.  Avoid going out in dry windy days - especially early morning. Pollen counts are highest between 5 - 10 AM and on dry, hot and windy days.  Save outside activities for late afternoon or after a heavy rain, when pollen levels are lower.  Avoid mowing of grass if you have grass pollen allergy. Be aware that pollen can also be transported indoors on people and pets.  Dry your clothes in an automatic dryer rather than hanging them outside where they might collect pollen.  Rinse hair and eyes before bedtime.  Pet Allergen Avoidance: Contrary to popular opinion, there are no "hypoallergenic" breeds of dogs or cats. That is because people are not allergic to an animal's hair, but to an allergen found in the animal's saliva, dander (dead skin flakes) or urine. Pet allergy symptoms typically occur within minutes. For some people,  symptoms can build up and become most severe 8 to 12 hours after contact with the animal. People with severe allergies can experience  reactions in public places if dander has been transported on the pet owners' clothing. Keeping an animal outdoors is only a partial solution, since homes with pets in the yard still have higher concentrations of animal allergens. Before getting a pet, ask your allergist to determine if you are allergic to animals. If your pet is already considered part of your family, try to minimize contact and keep the pet out of the bedroom and other rooms where you spend a great deal of time. As with dust mites, vacuum carpets often or replace carpet with a hardwood floor, tile or linoleum. High-efficiency particulate air (HEPA) cleaners can reduce allergen levels over time. While dander and saliva are the source of cat and dog allergens, urine is the source of allergens from rabbits, hamsters, mice and [place] pigs; so ask a non-allergic family member to clean the animal's cage. If you have a pet allergy, talk to your allergist about the potential for allergy immunotherapy (allergy shots). This strategy can often provide long-term relief.  Mold Control Mold and fungi can grow on a variety of surfaces provided certain temperature and moisture conditions exist.  Outdoor molds grow on plants, decaying vegetation and soil. The major outdoor mold, Alternaria and Cladosporium, are found in very high numbers during hot and dry conditions. Generally, a late summer - fall peak is seen for common outdoor fungal spores. Rain will temporarily lower outdoor mold spore count, but counts rise rapidly when the rainy period ends. The most important indoor molds are Aspergillus and Penicillium. Dark, humid and poorly ventilated basements are ideal sites for mold growth. The next most common sites of mold growth are the bathroom and the kitchen. Outdoor (Seasonal) Mold Control Use air conditioning and keep windows closed. Avoid exposure to decaying vegetation. Avoid leaf raking. Avoid grain handling. Consider wearing a face mask  if working in moldy areas.  Indoor (Perennial) Mold Control  Maintain humidity below 50%. Get rid of mold growth on hard surfaces with water, detergent and, if necessary, 5% bleach (do not mix with other cleaners). Then dry the area completely. If mold covers an area more than 10 square feet, consider hiring an indoor environmental professional. For clothing, washing with soap and water is best. If moldy items cannot be cleaned and dried, throw them away. Remove sources e.g. contaminated carpets. Repair and seal leaking roofs or pipes. Using dehumidifiers in damp basements may be helpful, but empty the water and clean units regularly to prevent mildew from forming. All rooms, especially basements, bathrooms and kitchens, require ventilation and cleaning to deter mold and mildew growth. Avoid carpeting on concrete or damp floors, and storing items in damp areas.   Skin care recommendations  Bath time: Always use lukewarm water. AVOID very hot or cold water. Keep bathing time to 5-10 minutes. Do NOT use bubble bath. Use a mild soap and use just enough to wash the dirty areas. Do NOT scrub skin vigorously.  After bathing, pat dry your skin with a towel. Do NOT rub or scrub the skin.  Moisturizers and prescriptions:  ALWAYS apply moisturizers immediately after bathing (within 3 minutes). This helps to lock-in moisture. Use the moisturizer several times a day over the whole body. Good summer moisturizers include: Aveeno, CeraVe, Cetaphil. Good winter moisturizers include: Aquaphor, Vaseline, Cerave, Cetaphil, Eucerin, Vanicream. When using moisturizers along with medications, the moisturizer  should be applied about one hour after applying the medication to prevent diluting effect of the medication or moisturize around where you applied the medications. When not using medications, the moisturizer can be continued twice daily as maintenance.  Laundry and clothing: Avoid laundry products with  added color or perfumes. Use unscented hypo-allergenic laundry products such as Tide free, Cheer free & gentle, and All free and clear.  If the skin still seems dry or sensitive, you can try double-rinsing the clothes. Avoid tight or scratchy clothing such as wool. Do not use fabric softeners or dyer sheets.

## 2022-12-03 NOTE — Assessment & Plan Note (Signed)
.   See assessment and plan as above. 

## 2022-12-03 NOTE — Assessment & Plan Note (Signed)
Usually flares with URIs. No recent prednisone. Today's spirometry was normal.  Daily controller medication(s): none.  May use Airsupra rescue inhaler 2 puffs every 4 to 6 hours as needed for shortness of breath, chest tightness, coughing, and wheezing. Do not use more than 12 puffs in 24 hours. May use Airsupra rescue inhaler 2 puffs 5 to 15 minutes prior to strenuous physical activities. Monitor frequency of use. Rinse mouth after each use. If not covered then use albuterol as before.  Coupon given.

## 2022-12-03 NOTE — Assessment & Plan Note (Signed)
>>  ASSESSMENT AND PLAN FOR OTHER ALLERGIC RHINITIS WRITTEN ON 12/03/2022  1:27 PM BY Garnet Sierras, DO  Perennial rhinoconjunctivitis symptoms for 40+ years which flares in the spring and fall.  Was on AIT at age 51 and age 61s for less than 1 year.  Had some pruritus after the last injection so she stopped.  3 cats and 2 dogs at home.  One of the cats do flare her symptoms. Today's skin testing showed: Positive to grass, weed, ragweed, trees, cat, dog, mold. Start environmental control measures as below. Use over the counter antihistamines such as Zyrtec (cetirizine), Claritin (loratadine), Allegra (fexofenadine), or Xyzal (levocetirizine) daily as needed. May take twice a day during allergy flares. May switch antihistamines every few months. Continue Singulair (montelukast) 10mg  daily at night. Start Ryaltris (olopatadine + mometasone nasal spray combination) 1-2 sprays per nostril twice a day. Sample given. This replaces your other nasal sprays. If this works well for you, then have [facility] ship the medication to your home - prescription already sent in.  Nasal saline spray (i.e., Simply Saline) or nasal saline lavage (i.e., NeilMed) is recommended as needed and prior to medicated nasal sprays. May use over the counter eye drops as needed. Start allergy injections. Let us know when ready to start.  Had a detailed discussion with patient/family that clinical history is suggestive of allergic rhinitis, and may benefit from allergy immunotherapy (AIT). Discussed in detail regarding the dosing, schedule, side effects (mild to moderate local allergic reaction and rarely systemic allergic reactions including anaphylaxis), and benefits (significant improvement in nasal symptoms, seasonal flares of asthma) of immunotherapy with the patient. There is significant time commitment involved with allergy shots, which includes weekly immunotherapy injections for first 9-12 months and then biweekly to monthly  injections for 3-5 years. Consent was signed.

## 2022-12-03 NOTE — Assessment & Plan Note (Signed)
Perennial rhinoconjunctivitis symptoms for 40+ years which flares in the spring and fall.  Was on AIT at age 51 and age 21s for less than 1 year.  Had some pruritus after the last injection so she stopped.  3 cats and 2 dogs at home.  One of the cats do flare her symptoms. Today's skin testing showed: Positive to grass, weed, ragweed, trees, cat, dog, mold. Start environmental control measures as below. Use over the counter antihistamines such as Zyrtec (cetirizine), Claritin (loratadine), Allegra (fexofenadine), or Xyzal (levocetirizine) daily as needed. May take twice a day during allergy flares. May switch antihistamines every few months. Continue Singulair (montelukast) '10mg'$  daily at night. Start Ryaltris (olopatadine + mometasone nasal spray combination) 1-2 sprays per nostril twice a day. Sample given. This replaces your other nasal sprays. If this works well for you, then have [facility] ship the medication to your home - prescription already sent in.  Nasal saline spray (i.e., Simply Saline) or nasal saline lavage (i.e., NeilMed) is recommended as needed and prior to medicated nasal sprays. May use over the counter eye drops as needed. Start allergy injections. Let us know when ready to start.  Had a detailed discussion with patient/family that clinical history is suggestive of allergic rhinitis, and may benefit from allergy immunotherapy (AIT). Discussed in detail regarding the dosing, schedule, side effects (mild to moderate local allergic reaction and rarely systemic allergic reactions including anaphylaxis), and benefits (significant improvement in nasal symptoms, seasonal flares of asthma) of immunotherapy with the patient. There is significant time commitment involved with allergy shots, which includes weekly immunotherapy injections for first 9-12 months and then biweekly to monthly injections for 3-5 years. Consent was signed.

## 2022-12-04 ENCOUNTER — Other Ambulatory Visit (HOSPITAL_COMMUNITY): Payer: Self-pay

## 2022-12-06 ENCOUNTER — Other Ambulatory Visit (HOSPITAL_COMMUNITY): Payer: Self-pay

## 2022-12-10 ENCOUNTER — Other Ambulatory Visit (HOSPITAL_COMMUNITY): Payer: Self-pay

## 2022-12-12 ENCOUNTER — Other Ambulatory Visit: Payer: Self-pay

## 2022-12-18 ENCOUNTER — Other Ambulatory Visit (HOSPITAL_COMMUNITY): Payer: Self-pay

## 2022-12-20 ENCOUNTER — Other Ambulatory Visit (HOSPITAL_COMMUNITY): Payer: Self-pay

## 2022-12-25 ENCOUNTER — Other Ambulatory Visit (HOSPITAL_COMMUNITY): Payer: Self-pay

## 2023-01-09 ENCOUNTER — Other Ambulatory Visit: Payer: Self-pay | Admitting: Allergy

## 2023-01-09 DIAGNOSIS — J3089 Other allergic rhinitis: Secondary | ICD-10-CM

## 2023-01-09 DIAGNOSIS — L2089 Other atopic dermatitis: Secondary | ICD-10-CM

## 2023-01-09 MED ORDER — EPINEPHRINE 0.3 MG/0.3ML IJ SOAJ
0.3000 mg | INTRAMUSCULAR | 1 refills | Status: AC | PRN
  Filled 2023-01-09: qty 2, 10d supply, fill #0

## 2023-01-10 ENCOUNTER — Other Ambulatory Visit (HOSPITAL_COMMUNITY): Payer: Self-pay

## 2023-01-10 DIAGNOSIS — J301 Allergic rhinitis due to pollen: Secondary | ICD-10-CM

## 2023-01-10 NOTE — Progress Notes (Signed)
Aeroallergen Immunotherapy   Ordering Provider: Dr. Rexene Alberts   Patient Details  Name: Tricia Potts  MRN: [id number]  Date of Birth: 10/09/1971   Order 2 of 2   Vial Label: M-C-D   0.2 ml (Volume)  1:20 Concentration -- Alternaria alternata  0.5 ml (Volume)  1:10 Concentration -- Cat Hair  0.5 ml (Volume)  1:10 Concentration -- Dog Epithelia    1.2  ml Extract Subtotal  3.8  ml Diluent  5.0  ml Maintenance Total   Schedule:  B  Silver Vial (1:1,000,000): Schedule B (6 doses)  Blue Vial (1:100,000): Schedule B (6 doses)  Yellow Vial (1:10,000): Schedule B (6 doses)  Green Vial (1:1,000): Schedule B (6 doses)  Red Vial (1:100): Schedule A (14 doses)   Special Instructions: once per week during build up.

## 2023-01-10 NOTE — Progress Notes (Signed)
VIALS EXP 01-11-24

## 2023-01-10 NOTE — Progress Notes (Signed)
Aeroallergen Immunotherapy  Ordering Provider: Dr. Rexene Alberts  Patient Details Name: Tricia Potts MRN: [id number] Date of Birth: 1971/03/31  Order 1 of 2  Vial Label: G-RW-W-T  0.3 ml (Volume)  BAU Concentration -- 7 Grass Mix* 100,000 ([address] [place], [place], [place], [place], [place], [place], [place]) 0.2 ml (Volume)  1:20 Concentration -- [place] 0.3 ml (Volume)  BAU Concentration -- [place] 10,000 0.2 ml (Volume)  1:20 Concentration -- [place] 0.3 ml (Volume)  1:20 Concentration -- Ragweed Mix 0.5 ml (Volume)  1:20 Concentration -- Weed Mix* 0.5 ml (Volume)  1:20 Concentration -- Eastern 10 Tree Mix (also Sweet Gum)   2.3  ml Extract Subtotal 2.7  ml Diluent 5.0  ml Maintenance Total  Schedule:  B Silver Vial (1:1,000,000): Schedule B (6 doses) Blue Vial (1:100,000): Schedule B (6 doses) Yellow Vial (1:10,000): Schedule B (6 doses) Green Vial (1:1,000): Schedule B (6 doses) Red Vial (1:100): Schedule A (14 doses)  Special Instructions: once per week during build up.

## 2023-01-11 ENCOUNTER — Ambulatory Visit: Payer: 59 | Attending: Family Medicine | Admitting: Pharmacist

## 2023-01-11 ENCOUNTER — Other Ambulatory Visit (HOSPITAL_COMMUNITY): Payer: Self-pay

## 2023-01-11 DIAGNOSIS — J3081 Allergic rhinitis due to animal (cat) (dog) hair and dander: Secondary | ICD-10-CM | POA: Diagnosis not present

## 2023-01-11 DIAGNOSIS — Z79899 Other long term (current) drug therapy: Secondary | ICD-10-CM

## 2023-01-11 MED ORDER — DUPIXENT 300 MG/2ML ~~LOC~~ SOAJ
SUBCUTANEOUS | 4 refills | Status: DC
  Filled 2023-01-11: qty 4, 28d supply, fill #0
  Filled 2023-02-19: qty 4, 28d supply, fill #1

## 2023-01-11 NOTE — Progress Notes (Signed)
   S: Patient presents for review of their specialty medication therapy.  Patient is currently taking Dupixent for atopic dermatitis. Patient is managed by Dr. Pearline Cables for this.   Adherence: confirms.   Efficacy: reports that while not 100%, the medication works fairly well for her.    Dosing: 300 mg subq Every 14 days.   Dose adjustments: Renal: no dose adjustments (has not been studied) Hepatic: no dose adjustments (has not been studied)  Drug-drug interactions: none identified   Screening: TB test: completed   Monitoring: S/sx of infection: none  S/sx of hypersensitivity: none  S/sx of ocular effects: none  S/sx of eosinophilia/vasculitis: none   O:     Lab Results  Component Value Date   WBC 4.7 10/22/2022   HGB 14.2 10/22/2022   HCT 42.2 10/22/2022   MCV 92.0 10/22/2022   PLT 295.0 10/22/2022      Chemistry      Component Value Date/Time   NA 138 10/22/2022 0901   K 4.1 10/22/2022 0901   CL 103 10/22/2022 0901   CO2 25 10/22/2022 0901   BUN 13 10/22/2022 0901   CREATININE 0.64 10/22/2022 0901   CREATININE 0.63 05/31/2014 0913      Component Value Date/Time   CALCIUM 9.6 10/22/2022 0901   CALCIUM 9.7 04/07/2010 0000   ALKPHOS 66 10/22/2022 0901   AST 23 10/22/2022 0901   ALT 21 10/22/2022 0901   BILITOT 0.6 10/22/2022 0901       A/P: 1. Medication review: Patient currently on [place] for atopic dermatitis. Reviewed the medication with the patient, including the following: Dupixent is a monoclonal antibody used for the treatment of asthma or atopic dermatitis. Patient educated on purpose, proper use and potential adverse effects of Dupixent. Possible adverse effects include increased risk of infection, ocular effects, vasculitis/eosinophilia, and hypersensitivity reactions. Administer as a SubQ injection and rotate sites. Allow the medication to reach room temp prior to administration (45 mins for 300 mg syringe or 30 min for 200 mg syringe). Do not  shake. Discard any unused portion. No recommendations for any changes.   Benard Halsted, PharmD, [place], [place] [phone]

## 2023-01-14 ENCOUNTER — Other Ambulatory Visit (HOSPITAL_COMMUNITY): Payer: Self-pay

## 2023-01-14 ENCOUNTER — Telehealth: Payer: Self-pay

## 2023-01-14 MED ORDER — RYALTRIS 665-25 MCG/ACT NA SUSP
1.0000 | Freq: Two times a day (BID) | NASAL | 5 refills | Status: DC
  Filled 2023-01-14: qty 29, 30d supply, fill #0

## 2023-01-14 NOTE — Telephone Encounter (Signed)
Patient called in - DOB/Phamacy verified - request Ryaltris prescription be sent to [facility] instead of [facility].

## 2023-01-15 ENCOUNTER — Other Ambulatory Visit (HOSPITAL_COMMUNITY): Payer: Self-pay

## 2023-01-16 ENCOUNTER — Other Ambulatory Visit: Payer: Self-pay

## 2023-01-18 ENCOUNTER — Other Ambulatory Visit (HOSPITAL_COMMUNITY): Payer: Self-pay

## 2023-01-20 ENCOUNTER — Other Ambulatory Visit: Payer: Self-pay | Admitting: Family Medicine

## 2023-01-20 ENCOUNTER — Encounter: Payer: Self-pay | Admitting: Family Medicine

## 2023-01-22 ENCOUNTER — Other Ambulatory Visit (HOSPITAL_COMMUNITY): Payer: Self-pay

## 2023-01-22 MED ORDER — VITAMIN D 50 MCG (2000 UT) PO CAPS
2000.0000 [IU] | ORAL_CAPSULE | Freq: Every morning | ORAL | 5 refills | Status: AC
  Filled 2023-01-22: qty 30, 30d supply, fill #0

## 2023-01-23 ENCOUNTER — Ambulatory Visit (INDEPENDENT_AMBULATORY_CARE_PROVIDER_SITE_OTHER): Payer: 59 | Admitting: Family

## 2023-01-23 ENCOUNTER — Other Ambulatory Visit (HOSPITAL_COMMUNITY): Payer: Self-pay

## 2023-01-23 ENCOUNTER — Other Ambulatory Visit (HOSPITAL_BASED_OUTPATIENT_CLINIC_OR_DEPARTMENT_OTHER): Payer: Self-pay

## 2023-01-23 VITALS — BP 130/83 | HR 78 | Temp 98.2°F | Resp 16 | Ht 60.75 in | Wt 168.0 lb

## 2023-01-23 DIAGNOSIS — H6501 Acute serous otitis media, right ear: Secondary | ICD-10-CM | POA: Insufficient documentation

## 2023-01-23 DIAGNOSIS — H6503 Acute serous otitis media, bilateral: Secondary | ICD-10-CM

## 2023-01-23 MED ORDER — AMOXICILLIN 500 MG PO CAPS
500.0000 mg | ORAL_CAPSULE | Freq: Three times a day (TID) | ORAL | 0 refills | Status: DC
  Filled 2023-01-23: qty 30, 10d supply, fill #0

## 2023-01-23 NOTE — Patient Instructions (Signed)
Continue flonase 2 sprays each nostril daily.  Start amoxicillin. Continue sudafed. Call if symptoms worsen or if not improved in 1 week.

## 2023-01-23 NOTE — Progress Notes (Signed)
Subjective:     Patient ID: Tricia Potts, female    DOB: 09/22/1971, 52 y.o.   MRN: [id number]  Chief Complaint  Patient presents with   Ear Pain    R worse than L    HPI Patient is in today with c/o ear discomfort. R>L.  Feels like her voice is loud. She has been using Rogers wax removal.  Denies any recent cough or cold.  She is followed by Allergist.  She is   Health Maintenance Due  Topic Date Due   HIV Screening  Never done    Past Medical History:  Diagnosis Date   Allergy    Anemia 06/18/2014   Anxiety    Anxiety and depression 02/17/2009   Qualifier: Diagnosis of  By: Redmond Pulling MD, LauraLee     Asthma    mild - rarely uses inhaler   Broken ankle 2011   (left) roller skating   Bronchitis, mucopurulent recurrent ([place]) 04/21/2013   Cervical cancer screening 02/20/2016   Depression    Eczema    GERD (gastroesophageal reflux disease)    Hemorrhoid    History of hidradenitis suppurativa    Hyperlipemia    IBS (irritable bowel syndrome)    Kidney infection    as a child   Low back pain 05/25/2016   Osteopenia    PCO (polycystic ovaries)    Pneumonia    Pruritus 08/22/2017    Past Surgical History:  Procedure Laterality Date   ANKLE SURGERY Left    plate and 8 screws in left ankle   CARPAL TUNNEL RELEASE Left 11/05/2022   Procedure: Carpel Tunnel Release - left;  Surgeon: Eustace Moore, MD;  Location: [facility];  Service: Neurosurgery;  Laterality: Left;   EYE SURGERY     Lasik   HEMORRHOID SURGERY     WISDOM TOOTH EXTRACTION      Family History  Problem Relation Age of Onset   Allergic rhinitis Mother    Eczema Mother    Asthma Mother    Allergies Mother    COPD Mother    Irritable bowel syndrome Mother    Allergic rhinitis Father    Heart disease Father        mitral valve disease/rupture during physical stress   GER disease Father    Irritable bowel syndrome Father    Heart failure Father    Allergic rhinitis Sister    Allergies Sister     Eczema Sister    Cancer Sister 7       breast, DCIS   Allergic rhinitis Sister    Allergic rhinitis Brother    Hepatitis C Brother    Cancer Brother 35       ALL   Cancer Maternal Grandmother        ovarian cancer   Heart disease Maternal Grandfather        MI at 39   Kidney disease Paternal Grandmother    Kidney disease Paternal Grandfather        possible kidney cancer   Heart disease Daughter        asd s/p repair at age 79   Anxiety disorder Daughter    Allergies Daughter    Irritable bowel syndrome Niece    Ovarian cancer Other        Grandmother   Arthritis Other    Colon cancer Neg Hx    Rectal cancer Neg Hx    Esophageal cancer Neg Hx  Stomach cancer Neg Hx     Social History   Socioeconomic History   Marital status: Married    Spouse name: Not on file   Number of children: 3   Years of education: Not on file   Highest education level: Not on file  Occupational History   Occupation: Programmer, multimedia: [employer]  Tobacco Use   Smoking status: Never    Passive exposure: Past   Smokeless tobacco: Never  Vaping Use   Vaping Use: Never used  Substance and Sexual Activity   Alcohol use: No   Drug use: No   Sexual activity: Yes    Partners: Male    Birth control/protection: Post-menopausal  Other Topics Concern   Not on file  Social History Narrative   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     albuterol (VENTOLIN HFA) 108 (90 Base) MCG/ACT inhaler Inhale 2 puffs into the lungs every 6 (six)  hours as needed for wheezing or shortness of breath. 18 g 2   Albuterol-Budesonide (AIRSUPRA) 90-80 MCG/ACT AERO Inhale 2 puffs into the lungs every 4 (four) hours as needed (coughing, wheezing, chest tightness). Do not exceed 12 puffs in 24 hours. 10.7 g 2   ALPRAZolam (XANAX) 0.25 MG tablet Take 1 tablet by mouth 2 (two) times daily as needed for anxiety. 40 tablet 1   atorvastatin (LIPITOR) 20 MG tablet Take 1 tablet (20 mg total) by mouth at bedtime. 90 tablet 1   Brimonidine Tartrate (LUMIFY) 0.025 % SOLN Place 1 drop into both eyes 2 (two) times daily as needed (eye irritation.).     CALCIUM CITRATE PO Take 600 mg by mouth in the morning.     Cholecalciferol (VITAMIN D) 50 MCG (2000 UT) CAPS Take 1 capsule (2,000 Units total) by mouth in the morning. 30 capsule 5   clindamycin (CLEOCIN T) 1 % external solution Apply a small amount to skin 2 - 3 times a week for acne (Patient taking differently: Apply 1 Application topically daily as needed (acne).) 60 mL 5   Crisaborole 2 % OINT APPLY TO FINGERTIPS 2 TIMES DAILY. 100 g 3   Dermatological Products, Misc. [facility]) lotion Apply 1 Application topically in the morning and at bedtime.     Dupilumab (DUPIXENT) 300 MG/2ML SOPN inject one pen subcutaneously evrey other week 4 mL 4   famotidine (PEPCID) 40 MG tablet Take 1 tablet (40 mg total) by mouth at bedtime. 90 tablet 1   fexofenadine (SM FEXOFENADINE HCL) 180 MG tablet TAKE 1 TABLET BY MOUTH DAILY. (Patient taking differently: Take 180 mg by mouth at bedtime.) 90 tablet 1   hydroxypropyl methylcellulose / hypromellose (ISOPTO TEARS / GONIOVISC) 2.5 % ophthalmic solution Place 1 drop into both eyes 3 (three) times daily as needed for dry eyes.     ibuprofen (ADVIL,MOTRIN) 200 MG tablet Take 400-800 mg by mouth every 6 (six) hours as needed for headache or mild pain.     levocetirizine (XYZAL) 5  MG tablet Take 5 mg by mouth in the morning.     montelukast (SINGULAIR) 10 MG tablet Take 1 tablet (10  mg total) by mouth at bedtime as needed. (Patient taking differently: Take 10 mg by mouth at bedtime.) 90 tablet 3   Multiple Vitamin (MULTI-VITAMIN DAILY PO) Take 1 tablet by mouth in the morning.     naphazoline-pheniramine (ALLERGY EYE) 0.025-0.3 % ophthalmic solution Place 1 drop into both eyes 3 (three) times daily as needed for eye irritation or allergies.     Olopatadine-Mometasone (RYALTRIS) [id number] MCG/ACT SUSP Place 1-2 sprays into the nose in the morning and at bedtime. 29 g 5   Probiotic Product (PROBIOTIC DAILY PO) Take 1 capsule by mouth in the morning. Women's Probiotic     spironolactone (ALDACTONE) 50 MG tablet Take 1 tablet (50 mg total) by mouth daily. 30 tablet 2   venlafaxine XR (EFFEXOR-XR) 75 MG 24 hr capsule Take 1 capsule (75 mg total) by mouth daily with breakfast. 90 capsule 1   EPINEPHrine 0.3 mg/0.3 mL IJ SOAJ injection Inject 0.3 mg into the muscle as needed for anaphylaxis. (Patient not taking: Reported on 01/23/2023) 2 each 1   doxycycline (VIBRAMYCIN) 100 MG capsule Take 1 capsule (100 mg total) by mouth daily as needed. (Patient not taking: Reported on 11/27/2022) 30 capsule 1   HYDROcodone-acetaminophen (NORCO/VICODIN) 5-325 MG tablet Take 1 tablet by mouth every 6 (six) hours as needed for pain. (Patient not taking: Reported on 01/23/2023) 20 tablet 0   No facility-administered medications prior to visit.    No Known Allergies  ROS     Objective:    Physical Exam Constitutional:      Appearance: Normal appearance.  HENT:     Right Ear: Ear canal normal.     Left Ear: Ear canal normal.     Ears:     Comments: Bilateral serous effusion, R TM was slightly bulging Pulmonary:     Effort: Pulmonary effort is normal.  Skin:    General: Skin is warm and dry.  Neurological:     Mental Status: She is alert and oriented to person, place, and time.  Psychiatric:        Mood and Affect: Mood normal.        Behavior: Behavior normal.        Thought Content:  Thought content normal.     BP 130/83   Pulse 78   Temp 98.2 F (36.8 C) (Oral)   Resp 16   Ht 5' 0.75" (1.543 m)   Wt 168 lb (76.2 kg)   LMP  (LMP Unknown)   SpO2 98%   BMI 32.01 kg/m  Wt Readings from Last 3 Encounters:  01/23/23 168 lb (76.2 kg)  12/03/22 167 lb 11.2 oz (76.1 kg)  11/13/22 168 lb 9.6 oz (76.5 kg)       Assessment & Plan:   Problem List Items Addressed This Visit       Unprioritized   Bilateral acute serous otitis media - Primary    New. Pt is advised as follows:  Continue flonase 2 sprays each nostril daily.  Start amoxicillin. Continue sudafed. Call if symptoms worsen or if not improved in 1 week.       Relevant Medications   amoxicillin (AMOXIL) 500 MG capsule    I have discontinued Bonita Brindisi. Reznik's doxycycline and HYDROcodone-acetaminophen. I am also having her start on amoxicillin. Additionally, I am having her maintain her Multiple Vitamin (MULTI-VITAMIN DAILY PO),  CALCIUM CITRATE PO, ibuprofen, albuterol, clindamycin, EpiCeram, montelukast, ALPRAZolam, atorvastatin, fexofenadine, Crisaborole, venlafaxine XR, famotidine, Probiotic Product (PROBIOTIC DAILY PO), levocetirizine, acetaminophen, hydroxypropyl methylcellulose / hypromellose, Allergy Eye, Lumify, spironolactone, Airsupra, EPINEPHrine, Dupixent, Ryaltris, and Vitamin D.  Meds ordered this encounter  Medications   amoxicillin (AMOXIL) 500 MG capsule    Sig: Take 1 capsule (500 mg total) by mouth 3 (three) times daily for 10 days.    Dispense:  30 capsule    Refill:  0    Order Specific Question:   Supervising Provider    Answer:   Penni Homans A [[id number]]

## 2023-01-23 NOTE — Assessment & Plan Note (Signed)
New. Pt is advised as follows:  Continue flonase 2 sprays each nostril daily.  Start amoxicillin. Continue sudafed. Call if symptoms worsen or if not improved in 1 week.

## 2023-01-28 ENCOUNTER — Other Ambulatory Visit (HOSPITAL_COMMUNITY): Payer: Self-pay

## 2023-01-29 ENCOUNTER — Encounter: Payer: Self-pay | Admitting: Family

## 2023-01-29 ENCOUNTER — Other Ambulatory Visit (HOSPITAL_COMMUNITY): Payer: Self-pay

## 2023-01-29 DIAGNOSIS — H6593 Unspecified nonsuppurative otitis media, bilateral: Secondary | ICD-10-CM

## 2023-01-29 MED ORDER — CEFDINIR 300 MG PO CAPS
300.0000 mg | ORAL_CAPSULE | Freq: Two times a day (BID) | ORAL | 0 refills | Status: DC
  Filled 2023-01-29: qty 20, 10d supply, fill #0

## 2023-01-30 ENCOUNTER — Ambulatory Visit (INDEPENDENT_AMBULATORY_CARE_PROVIDER_SITE_OTHER): Payer: 59

## 2023-01-30 DIAGNOSIS — J309 Allergic rhinitis, unspecified: Secondary | ICD-10-CM | POA: Diagnosis not present

## 2023-01-30 NOTE — Progress Notes (Signed)
Immunotherapy   Patient Details  Name: Tricia Potts MRN: [id number] Date of Birth: 1971-10-20  01/30/2023  Richrd Humbles here to start allergy injections for G-RW-W-T and M-C-D. Patient received 0.05 ml of both her silver vials with an expiration of 01/11/2024. Patient waited 30 minutes with no problems.  Following schedule: B  Frequency:1 time per week Epi-Pen:Epi-Pen Available  Consent signed and patient instructions given.   Herbie Drape 01/30/2023, 8:31 AM

## 2023-02-04 ENCOUNTER — Other Ambulatory Visit: Payer: Self-pay | Admitting: Family

## 2023-02-04 ENCOUNTER — Ambulatory Visit (INDEPENDENT_AMBULATORY_CARE_PROVIDER_SITE_OTHER): Payer: 59 | Admitting: *Deleted

## 2023-02-04 DIAGNOSIS — J309 Allergic rhinitis, unspecified: Secondary | ICD-10-CM | POA: Diagnosis not present

## 2023-02-04 DIAGNOSIS — H6593 Unspecified nonsuppurative otitis media, bilateral: Secondary | ICD-10-CM

## 2023-02-08 NOTE — Addendum Note (Signed)
Addended by: [name] on: 02/08/2023 08:22 AM   Modules accepted: Orders

## 2023-02-13 ENCOUNTER — Other Ambulatory Visit (HOSPITAL_BASED_OUTPATIENT_CLINIC_OR_DEPARTMENT_OTHER): Payer: Self-pay

## 2023-02-13 ENCOUNTER — Ambulatory Visit (INDEPENDENT_AMBULATORY_CARE_PROVIDER_SITE_OTHER): Payer: 59

## 2023-02-13 ENCOUNTER — Ambulatory Visit (INDEPENDENT_AMBULATORY_CARE_PROVIDER_SITE_OTHER): Payer: 59 | Admitting: Family

## 2023-02-13 VITALS — BP 126/82 | HR 96 | Temp 97.9°F | Resp 16 | Wt 168.0 lb

## 2023-02-13 DIAGNOSIS — J309 Allergic rhinitis, unspecified: Secondary | ICD-10-CM | POA: Diagnosis not present

## 2023-02-13 DIAGNOSIS — H6501 Acute serous otitis media, right ear: Secondary | ICD-10-CM

## 2023-02-13 MED ORDER — PREDNISONE 10 MG PO TABS
40.0000 mg | ORAL_TABLET | Freq: Every day | ORAL | 0 refills | Status: DC
  Filled 2023-02-13: qty 20, 5d supply, fill #0

## 2023-02-13 NOTE — Progress Notes (Signed)
Subjective:   By signing my name below, I, Jamey Reas, attest that this documentation has been prepared under the direction and in the presence of [name], NP. 02/13/2023   Patient ID: Tricia Potts, female    DOB: 04/09/1971, 52 y.o.   MRN: [id number]  No chief complaint on file.   HPI Patient is in today for a follow-up appointment.  Ears: She complains of having difficulty hearing and feels fluid in her ear. She uses ryaltris 2x daily PO. She is taking  50 mg spironolactone daily PO.   Allergies: She is getting injections for her allergies.  ENT: She has an upcoming appointment with ENT in March.     Past Medical History:  Diagnosis Date   Allergy    Anemia 06/18/2014   Anxiety    Anxiety and depression 02/17/2009   Qualifier: Diagnosis of  By: Redmond Pulling MD, LauraLee     Asthma    mild - rarely uses inhaler   Broken ankle 2011   (left) roller skating   Bronchitis, mucopurulent recurrent ([place]) 04/21/2013   Cervical cancer screening 02/20/2016   Depression    Eczema    GERD (gastroesophageal reflux disease)    Hemorrhoid    History of hidradenitis suppurativa    Hyperlipemia    IBS (irritable bowel syndrome)    Kidney infection    as a child   Low back pain 05/25/2016   Osteopenia    PCO (polycystic ovaries)    Pneumonia    Pruritus 08/22/2017    Past Surgical History:  Procedure Laterality Date   ANKLE SURGERY Left    plate and 8 screws in left ankle   CARPAL TUNNEL RELEASE Left 11/05/2022   Procedure: Carpel Tunnel Release - left;  Surgeon: Eustace Moore, MD;  Location: [facility];  Service: Neurosurgery;  Laterality: Left;   EYE SURGERY     Lasik   HEMORRHOID SURGERY     WISDOM TOOTH EXTRACTION      Family History  Problem Relation Age of Onset   Allergic rhinitis Mother    Eczema Mother    Asthma Mother    Allergies Mother    COPD Mother    Irritable bowel syndrome Mother    Allergic rhinitis Father    Heart disease Father         mitral valve disease/rupture during physical stress   GER disease Father    Irritable bowel syndrome Father    Heart failure Father    Allergic rhinitis Sister    Allergies Sister    Eczema Sister    Cancer Sister 44       breast, DCIS   Allergic rhinitis Sister    Allergic rhinitis Brother    Hepatitis C Brother    Cancer Brother 52       ALL   Cancer Maternal Grandmother        ovarian cancer   Heart disease Maternal Grandfather        MI at 14   Kidney disease Paternal Grandmother    Kidney disease Paternal Grandfather        possible kidney cancer   Heart disease Daughter        asd s/p repair at age 66   Anxiety disorder Daughter    Allergies Daughter    Irritable bowel syndrome Niece    Ovarian cancer Other        Grandmother   Arthritis Other    Colon cancer Neg  Hx    Rectal cancer Neg Hx    Esophageal cancer Neg Hx    Stomach cancer Neg Hx     Social History   Socioeconomic History   Marital status: Married    Spouse name: Not on file   Number of children: 3   Years of education: Not on file   Highest education level: Not on file  Occupational History   Occupation: Programmer, multimedia: [employer]  Tobacco Use   Smoking status: Never    Passive exposure: Past   Smokeless tobacco: Never  Vaping Use   Vaping Use: Never used  Substance and Sexual Activity   Alcohol use: No   Drug use: No   Sexual activity: Yes    Partners: Male    Birth control/protection: Post-menopausal  Other Topics Concern   Not on file  Social History Narrative   Works in interventional radiology-RN [employer]   Married   3 daughters   Social Determinants of Health   Financial Resource Strain: Not on file  Food Insecurity: Not on file  Transportation Needs: Not on file  Physical Activity: Not on file  Stress: Not on file  Social Connections: Not on file  Intimate Partner Violence: Not on file    Outpatient Medications Prior to Visit  Medication Sig Dispense Refill    acetaminophen (TYLENOL) 500 MG tablet Take 1,000 mg by mouth in the morning and at bedtime.     albuterol (VENTOLIN HFA) 108 (90 Base) MCG/ACT inhaler Inhale 2 puffs into the lungs every 6 (six) hours as needed for wheezing or shortness of breath. 18 g 2   Albuterol-Budesonide (AIRSUPRA) 90-80 MCG/ACT AERO Inhale 2 puffs into the lungs every 4 (four) hours as needed (coughing, wheezing, chest tightness). Do not exceed 12 puffs in 24 hours. 10.7 g 2   ALPRAZolam (XANAX) 0.25 MG tablet Take 1 tablet by mouth 2 (two) times daily as needed for anxiety. 40 tablet 1   atorvastatin (LIPITOR) 20 MG tablet Take 1 tablet (20 mg total) by mouth at bedtime. 90 tablet 1   Brimonidine Tartrate (LUMIFY) 0.025 % SOLN Place 1 drop into both eyes 2 (two) times daily as needed (eye irritation.).     CALCIUM CITRATE PO Take 600 mg by mouth in the morning.     cefdinir (OMNICEF) 300 MG capsule Take 1 capsule (300 mg total) by mouth 2 (two) times daily. 20 capsule 0   Cholecalciferol (VITAMIN D) 50 MCG (2000 UT) CAPS Take 1 capsule (2,000 Units total) by mouth in the morning. 30 capsule 5   clindamycin (CLEOCIN T) 1 % external solution Apply a small amount to skin 2 - 3 times a week for acne (Patient taking differently: Apply 1 Application topically daily as needed (acne).) 60 mL 5   Crisaborole 2 % OINT APPLY TO FINGERTIPS 2 TIMES DAILY. 100 g 3   Dermatological Products, Misc. [facility]) lotion Apply 1 Application topically in the morning and at bedtime.     Dupilumab (DUPIXENT) 300 MG/2ML SOPN inject one pen subcutaneously evrey other week 4 mL 4   EPINEPHrine 0.3 mg/0.3 mL IJ SOAJ injection Inject 0.3 mg into the muscle as needed for anaphylaxis. (Patient not taking: Reported on 01/23/2023) 2 each 1   famotidine (PEPCID) 40 MG tablet Take 1 tablet (40 mg total) by mouth at bedtime. 90 tablet 1   fexofenadine (SM FEXOFENADINE HCL) 180 MG tablet TAKE 1 TABLET BY MOUTH DAILY. (Patient taking differently: Take 180 mg  by  mouth at bedtime.) 90 tablet 1   hydroxypropyl methylcellulose / hypromellose (ISOPTO TEARS / GONIOVISC) 2.5 % ophthalmic solution Place 1 drop into both eyes 3 (three) times daily as needed for dry eyes.     ibuprofen (ADVIL,MOTRIN) 200 MG tablet Take 400-800 mg by mouth every 6 (six) hours as needed for headache or mild pain.     levocetirizine (XYZAL) 5 MG tablet Take 5 mg by mouth in the morning.     montelukast (SINGULAIR) 10 MG tablet Take 1 tablet (10 mg total) by mouth at bedtime as needed. (Patient taking differently: Take 10 mg by mouth at bedtime.) 90 tablet 3   Multiple Vitamin (MULTI-VITAMIN DAILY PO) Take 1 tablet by mouth in the morning.     naphazoline-pheniramine (ALLERGY EYE) 0.025-0.3 % ophthalmic solution Place 1 drop into both eyes 3 (three) times daily as needed for eye irritation or allergies.     Olopatadine-Mometasone (RYALTRIS) [id number] MCG/ACT SUSP Place 1-2 sprays into the nose in the morning and at bedtime. 29 g 5   Probiotic Product (PROBIOTIC DAILY PO) Take 1 capsule by mouth in the morning. Women's Probiotic     spironolactone (ALDACTONE) 50 MG tablet Take 1 tablet (50 mg total) by mouth daily. 30 tablet 2   venlafaxine XR (EFFEXOR-XR) 75 MG 24 hr capsule Take 1 capsule (75 mg total) by mouth daily with breakfast. 90 capsule 1   No facility-administered medications prior to visit.    No Known Allergies  ROS     Objective:    Physical Exam Constitutional:      General: She is not in acute distress.    Appearance: Normal appearance.  HENT:     Head: Normocephalic and atraumatic.     Right Ear: Tympanic membrane and ear canal normal.     Left Ear: Tympanic membrane, ear canal and external ear normal.     Ears:     Comments: Clear fluid noted behind right EM (-) erythema Eyes:     Extraocular Movements: Extraocular movements intact.     Pupils: Pupils are equal, round, and reactive to light.  Cardiovascular:     Rate and Rhythm: Normal rate and regular  rhythm.     Heart sounds: Normal heart sounds. No murmur heard.    No gallop.  Pulmonary:     Effort: Pulmonary effort is normal. No respiratory distress.     Breath sounds: Normal breath sounds. No wheezing or rales.  Skin:    General: Skin is warm.  Neurological:     Mental Status: She is alert and oriented to person, place, and time.  Psychiatric:        Judgment: Judgment normal.     LMP  (LMP Unknown)  Wt Readings from Last 3 Encounters:  01/23/23 168 lb (76.2 kg)  12/03/22 167 lb 11.2 oz (76.1 kg)  11/13/22 168 lb 9.6 oz (76.5 kg)       Assessment & Plan:  There are no diagnoses linked to this encounter.  I, Jamey Reas, personally preformed the services described in this documentation.  All medical record entries made by the scribe were at my direction and in my presence.  I have reviewed the chart and discharge instructions (if applicable) and agree that the record reflects my personal performance and is accurate and complete. 02/13/2023   Lacretia Leigh as a scribe for Nance Pear, NP.,have documented all relevant documentation on the behalf of Nance Pear, NP,as directed by  Nance Pear, NP while in the presence of Nance Pear, NP.   Jamey Reas

## 2023-02-14 NOTE — Assessment & Plan Note (Signed)
Not significantly improved. I do not think she would benefit from any additional antibiotics.  I will give her a trial of short course of steroids and have advised her to keep her upcoming appointment with ENT.

## 2023-02-19 ENCOUNTER — Other Ambulatory Visit (HOSPITAL_COMMUNITY): Payer: Self-pay

## 2023-02-20 ENCOUNTER — Other Ambulatory Visit (HOSPITAL_COMMUNITY): Payer: Self-pay

## 2023-02-20 ENCOUNTER — Other Ambulatory Visit: Payer: Self-pay | Admitting: Family Medicine

## 2023-02-20 ENCOUNTER — Other Ambulatory Visit: Payer: Self-pay

## 2023-02-20 DIAGNOSIS — G4719 Other hypersomnia: Secondary | ICD-10-CM

## 2023-02-20 MED ORDER — ATORVASTATIN CALCIUM 20 MG PO TABS
20.0000 mg | ORAL_TABLET | Freq: Every evening | ORAL | 1 refills | Status: DC
  Filled 2023-02-20: qty 90, 90d supply, fill #0

## 2023-02-20 NOTE — Assessment & Plan Note (Signed)
Supplement and monitor 

## 2023-02-20 NOTE — Assessment & Plan Note (Signed)
hgba1c acceptable, minimize simple carbs. Increase exercise as tolerated.  

## 2023-02-20 NOTE — Assessment & Plan Note (Signed)
No recent exacerbation 

## 2023-02-20 NOTE — Assessment & Plan Note (Signed)
Encourage heart healthy diet such as MIND or DASH diet, increase exercise, avoid trans fats, simple carbohydrates and processed foods, consider a krill or fish or flaxseed oil cap daily.  °

## 2023-02-21 ENCOUNTER — Other Ambulatory Visit (HOSPITAL_COMMUNITY): Payer: Self-pay

## 2023-02-21 ENCOUNTER — Ambulatory Visit: Payer: Self-pay | Admitting: Nurse Practitioner

## 2023-02-21 ENCOUNTER — Other Ambulatory Visit: Payer: Self-pay

## 2023-02-21 ENCOUNTER — Other Ambulatory Visit (HOSPITAL_BASED_OUTPATIENT_CLINIC_OR_DEPARTMENT_OTHER): Payer: Self-pay

## 2023-02-21 ENCOUNTER — Ambulatory Visit (INDEPENDENT_AMBULATORY_CARE_PROVIDER_SITE_OTHER): Payer: 59 | Admitting: Family Medicine

## 2023-02-21 VITALS — BP 118/74 | HR 78 | Temp 98.0°F | Resp 16 | Ht 63.0 in | Wt 167.4 lb

## 2023-02-21 DIAGNOSIS — F419 Anxiety disorder, unspecified: Secondary | ICD-10-CM | POA: Diagnosis not present

## 2023-02-21 DIAGNOSIS — J45909 Unspecified asthma, uncomplicated: Secondary | ICD-10-CM

## 2023-02-21 DIAGNOSIS — F32A Depression, unspecified: Secondary | ICD-10-CM

## 2023-02-21 DIAGNOSIS — R7989 Other specified abnormal findings of blood chemistry: Secondary | ICD-10-CM | POA: Diagnosis not present

## 2023-02-21 DIAGNOSIS — R739 Hyperglycemia, unspecified: Secondary | ICD-10-CM

## 2023-02-21 DIAGNOSIS — E782 Mixed hyperlipidemia: Secondary | ICD-10-CM

## 2023-02-21 DIAGNOSIS — N951 Menopausal and female climacteric states: Secondary | ICD-10-CM

## 2023-02-21 DIAGNOSIS — L040 Acute lymphadenitis of face, head and neck: Secondary | ICD-10-CM | POA: Insufficient documentation

## 2023-02-21 LAB — TSH: TSH: 2.98 u[IU]/mL (ref 0.35–5.50)

## 2023-02-21 LAB — CBC WITH DIFFERENTIAL/PLATELET
Basophils Absolute: 0 10*3/uL (ref 0.0–0.1)
Basophils Relative: 0.5 % (ref 0.0–3.0)
Eosinophils Absolute: 0.2 10*3/uL (ref 0.0–0.7)
Eosinophils Relative: 3.3 % (ref 0.0–5.0)
HCT: 44.3 % (ref 36.0–46.0)
Hemoglobin: 15.1 g/dL — ABNORMAL HIGH (ref 12.0–15.0)
Lymphocytes Relative: 35 % (ref 12.0–46.0)
Lymphs Abs: 2 10*3/uL (ref 0.7–4.0)
MCHC: 34.1 g/dL (ref 30.0–36.0)
MCV: 92.6 fl (ref 78.0–100.0)
Monocytes Absolute: 0.5 10*3/uL (ref 0.1–1.0)
Monocytes Relative: 8.2 % (ref 3.0–12.0)
Neutro Abs: 3.1 10*3/uL (ref 1.4–7.7)
Neutrophils Relative %: 53 % (ref 43.0–77.0)
Platelets: 389 10*3/uL (ref 150.0–400.0)
RBC: 4.79 Mil/uL (ref 3.87–5.11)
RDW: 13.2 % (ref 11.5–15.5)
WBC: 5.8 10*3/uL (ref 4.0–10.5)

## 2023-02-21 LAB — LIPID PANEL
Cholesterol: 179 mg/dL (ref 0–200)
HDL: 62.5 mg/dL (ref >39.00)
NonHDL: 116.11
Total CHOL/HDL Ratio: 3
Triglycerides: 225 mg/dL — ABNORMAL HIGH (ref 0.0–149.0)
VLDL: 45 mg/dL — ABNORMAL HIGH (ref 0.0–40.0)

## 2023-02-21 LAB — COMPREHENSIVE METABOLIC PANEL
ALT: 17 U/L (ref 0–35)
AST: 14 U/L (ref 0–37)
Albumin: 4.4 g/dL (ref 3.5–5.2)
Alkaline Phosphatase: 68 U/L (ref 39–117)
BUN: 12 mg/dL (ref 6–23)
CO2: 26 mEq/L (ref 19–32)
Calcium: 10.3 mg/dL (ref 8.4–10.5)
Chloride: 102 mEq/L (ref 96–112)
Creatinine, Ser: 0.84 mg/dL (ref 0.40–1.20)
GFR: 79.02 mL/min (ref >60.00)
Glucose, Bld: 87 mg/dL (ref 70–99)
Potassium: 4.6 mEq/L (ref 3.5–5.1)
Sodium: 139 mEq/L (ref 135–145)
Total Bilirubin: 0.6 mg/dL (ref 0.2–1.2)
Total Protein: 7.5 g/dL (ref 6.0–8.3)

## 2023-02-21 LAB — LDL CHOLESTEROL, DIRECT: Direct LDL: 93 mg/dL

## 2023-02-21 LAB — VITAMIN D 25 HYDROXY (VIT D DEFICIENCY, FRACTURES): VITD: 28.32 ng/mL — ABNORMAL LOW (ref 30.00–100.00)

## 2023-02-21 LAB — HEMOGLOBIN A1C: Hgb A1c MFr Bld: 5.6 % (ref 4.6–6.5)

## 2023-02-21 MED ORDER — LEVOFLOXACIN 250 MG PO TABS
250.0000 mg | ORAL_TABLET | Freq: Every day | ORAL | 0 refills | Status: DC
  Filled 2023-02-21 (×2): qty 5, 5d supply, fill #0

## 2023-02-21 NOTE — Assessment & Plan Note (Signed)
Small, frequent heart healthy diet with minimal carbohydrates, increase hydration, exercise and rest

## 2023-02-21 NOTE — Assessment & Plan Note (Signed)
Doing well on current dose of venlafaxine no changes

## 2023-02-21 NOTE — Assessment & Plan Note (Signed)
Below left year, tender, swollen, no concerns in the ear. Started on Levaquin and ultrasound ordered for further evaluation

## 2023-02-21 NOTE — Progress Notes (Signed)
Subjective:   By signing my name below, I, Kellie Simmering, attest that this documentation has been prepared under the direction and in the presence of Mosie Lukes, MD., 02/21/2023.   Patient ID: ESMA KOOI, female    DOB: April 15, 1971, 52 y.o.   MRN: [id number]  Chief Complaint  Patient presents with   Follow-up    Here for follow up   HPI Patient is in today for an office visit and is accompanied by her husband. She denies CP/palpitations/SOB/congestion/ fever/chills/GI or GU symptoms.  Allergic Rhinitis Patient is inquiring whether it is appropriate for her to receive an immunotherapy allergy injection today since she recently completed her Prednisone regimen on 02/18/2023.  Perimenopause She reports that she has been experiencing perimenopausal brain fog.  Serous Otis Media Patient was seen by [name], NP, on 02/13/2023 due to right acute serous otitis media. She was started on a 5-day regimen of Prednisone 10 mg. Today she reports that the pain/fullness in the right ear has slightly improved, but she noticed an enlarged lymph node behind this ear one week ago. This lymph node is tender and has increased in size since being discovered. Patient further reports that one week ago she started experiencing left ear pain. She confirms having slight headache and sore throat, but denies fever/chills. She continues to follow with ENT Dr. Laurance Flatten at [facility].  Sleep Apnea Patient has an appointment today with Marland Kitchen, NP, to manage her sleep apnea. She does snore at night and feels that the back of her throat is constantly swollen.  Past Medical History:  Diagnosis Date   Allergy    Anemia 06/18/2014   Anxiety    Anxiety and depression 02/17/2009   Qualifier: Diagnosis of  By: Redmond Pulling MD, LauraLee     Asthma    mild - rarely uses inhaler   Broken ankle 2011   (left) roller skating   Bronchitis, mucopurulent recurrent ([place]) 04/21/2013   Cervical  cancer screening 02/20/2016   Depression    Eczema    GERD (gastroesophageal reflux disease)    Hemorrhoid    History of hidradenitis suppurativa    Hyperlipemia    IBS (irritable bowel syndrome)    Kidney infection    as a child   Low back pain 05/25/2016   Osteopenia    PCO (polycystic ovaries)    Pneumonia    Pruritus 08/22/2017    Past Surgical History:  Procedure Laterality Date   ANKLE SURGERY Left    plate and 8 screws in left ankle   CARPAL TUNNEL RELEASE Left 11/05/2022   Procedure: Carpel Tunnel Release - left;  Surgeon: Eustace Moore, MD;  Location: [facility];  Service: Neurosurgery;  Laterality: Left;   EYE SURGERY     Lasik   HEMORRHOID SURGERY     WISDOM TOOTH EXTRACTION      Family History  Problem Relation Age of Onset   Allergic rhinitis Mother    Eczema Mother    Asthma Mother    Allergies Mother    COPD Mother    Irritable bowel syndrome Mother    Allergic rhinitis Father    Heart disease Father        mitral valve disease/rupture during physical stress   GER disease Father    Irritable bowel syndrome Father    Heart failure Father    Allergic rhinitis Sister    Allergies Sister    Eczema Sister    Cancer Sister  39       breast, DCIS   Allergic rhinitis Sister    Allergic rhinitis Brother    Hepatitis C Brother    Cancer Brother 29       ALL   Cancer Maternal Grandmother        ovarian cancer   Heart disease Maternal Grandfather        MI at 22   Kidney disease Paternal Grandmother    Kidney disease Paternal Grandfather        possible kidney cancer   Heart disease Daughter        asd s/p repair at age 76   Anxiety disorder Daughter    Allergies Daughter    Irritable bowel syndrome Niece    Ovarian cancer Other        Grandmother   Arthritis Other    Colon cancer Neg Hx    Rectal cancer Neg Hx    Esophageal cancer Neg Hx    Stomach cancer Neg Hx     Social History   Socioeconomic History   Marital status: Married    Spouse  name: Not on file   Number of children: 3   Years of education: Not on file   Highest education level: Not on file  Occupational History   Occupation: Programmer, multimedia: [employer]  Tobacco Use   Smoking status: Never    Passive exposure: Past   Smokeless tobacco: Never  Vaping Use   Vaping Use: Never used  Substance and Sexual Activity   Alcohol use: No   Drug use: No   Sexual activity: Yes    Partners: Male    Birth control/protection: Post-menopausal  Other Topics Concern   Not on file  Social History Narrative   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     albuterol (VENTOLIN HFA) 108 (90 Base) MCG/ACT inhaler Inhale 2 puffs into the lungs every 6 (six) hours as needed for wheezing or shortness of breath. 18 g 2   Albuterol-Budesonide (AIRSUPRA) 90-80 MCG/ACT AERO Inhale 2 puffs into the lungs every 4 (four) hours as needed (coughing, wheezing, chest tightness). Do not exceed 12 puffs in 24 hours. 10.7 g 2   ALPRAZolam (XANAX) 0.25 MG tablet Take 1 tablet by mouth 2 (two) times daily as needed for anxiety. 40 tablet 1   atorvastatin (LIPITOR) 20 MG tablet Take 1 tablet (20 mg total) by mouth at bedtime. 90 tablet 1   Brimonidine Tartrate (LUMIFY) 0.025 % SOLN Place 1 drop into both eyes 2 (two) times daily as needed (eye irritation.).     CALCIUM CITRATE PO Take 600 mg by mouth in the morning.     cefdinir (OMNICEF) 300 MG capsule Take 1 capsule (300 mg total) by mouth 2 (two) times daily. 20 capsule 0   Cholecalciferol  (VITAMIN D) 50 MCG (2000 UT) CAPS Take 1 capsule (2,000 Units total) by mouth in the morning. 30 capsule 5   clindamycin (CLEOCIN T)  1 % external solution Apply a small amount to skin 2 - 3 times a week for acne (Patient taking differently: Apply 1 Application topically daily as needed (acne).) 60 mL 5   Crisaborole 2 % OINT APPLY TO FINGERTIPS 2 TIMES DAILY. 100 g 3   Dermatological Products, Misc. [facility]) lotion Apply 1 Application topically in the morning and at bedtime.     Dupilumab (DUPIXENT) 300 MG/2ML SOPN inject one pen subcutaneously evrey other week 4 mL 4   EPINEPHrine 0.3 mg/0.3 mL IJ SOAJ injection Inject 0.3 mg into the muscle as needed for anaphylaxis. 2 each 1   famotidine (PEPCID) 40 MG tablet Take 1 tablet (40 mg total) by mouth at bedtime. 90 tablet 1   fexofenadine (SM FEXOFENADINE HCL) 180 MG tablet TAKE 1 TABLET BY MOUTH DAILY. (Patient taking differently: Take 180 mg by mouth at bedtime.) 90 tablet 1   hydroxypropyl methylcellulose / hypromellose (ISOPTO TEARS / GONIOVISC) 2.5 % ophthalmic solution Place 1 drop into both eyes 3 (three) times daily as needed for dry eyes.     ibuprofen (ADVIL,MOTRIN) 200 MG tablet Take 400-800 mg by mouth every 6 (six) hours as needed for headache or mild pain.     levocetirizine (XYZAL) 5 MG tablet Take 5 mg by mouth in the morning.     montelukast (SINGULAIR) 10 MG tablet Take 1 tablet (10 mg total) by mouth at bedtime as needed. (Patient taking differently: Take 10 mg by mouth at bedtime.) 90 tablet 3   Multiple Vitamin (MULTI-VITAMIN DAILY PO) Take 1 tablet by mouth in the morning.     naphazoline-pheniramine (ALLERGY EYE) 0.025-0.3 % ophthalmic solution Place 1 drop into both eyes 3 (three) times daily as needed for eye irritation or allergies.     Olopatadine-Mometasone (RYALTRIS) [id number] MCG/ACT SUSP Place 1-2 sprays into the nose in the morning and at bedtime. 29 g 5   predniSONE (DELTASONE) 10 MG tablet Take 4 tablets (40 mg total) by  mouth daily with breakfast. 20 tablet 0   Probiotic Product (PROBIOTIC DAILY PO) Take 1 capsule by mouth in the morning. Women's Probiotic     spironolactone (ALDACTONE) 50 MG tablet Take 1 tablet (50 mg total) by mouth daily. 30 tablet 2   venlafaxine XR (EFFEXOR-XR) 75 MG 24 hr capsule Take 1 capsule (75 mg total) by mouth daily with breakfast. 90 capsule 1   No facility-administered medications prior to visit.    No Known Allergies  Review of Systems  Constitutional:  Negative for chills and fever.  HENT:  Positive for ear pain (bilateral) and sore throat. Negative for congestion.        (+) lymphadenopathy.  Respiratory:  Negative for shortness of breath.   Cardiovascular:  Negative for chest pain and palpitations.  Gastrointestinal:  Negative for abdominal pain, blood in stool, constipation, diarrhea, nausea and vomiting.  Genitourinary:  Negative for dysuria, frequency, hematuria and urgency.  Skin:           Neurological:  Positive for headaches.      Objective:    Physical Exam Constitutional:      General: She is not in acute distress.    Appearance: Normal appearance. She is normal weight. She is not ill-appearing.  HENT:     Head: Normocephalic and atraumatic.     Right Ear: Tympanic membrane, ear canal and external ear normal.     Left Ear: Tympanic membrane, ear canal and external ear normal.     Nose: Nose  normal.     Mouth/Throat:     Mouth: Mucous membranes are moist.     Pharynx: Oropharynx is clear. Posterior oropharyngeal erythema present.     Comments: Back of the throat is erythematic.  Eyes:     General:        Right eye: No discharge.        Left eye: No discharge.     Extraocular Movements: Extraocular movements intact.     Conjunctiva/sclera: Conjunctivae normal.     Pupils: Pupils are equal, round, and reactive to light.  Neck:     Comments: Enlarged, tender lymph node below the jaw. Cardiovascular:     Rate and Rhythm: Normal rate and regular  rhythm.     Pulses: Normal pulses.     Heart sounds: Normal heart sounds. No murmur heard.    No gallop.  Pulmonary:     Effort: Pulmonary effort is normal. No respiratory distress.     Breath sounds: Normal breath sounds. No wheezing or rales.  Abdominal:     General: Bowel sounds are normal.     Palpations: Abdomen is soft.     Tenderness: There is no abdominal tenderness. There is no guarding.  Musculoskeletal:        General: Normal range of motion.     Cervical back: Normal range of motion.     Right lower leg: No edema.     Left lower leg: No edema.  Skin:    General: Skin is warm and dry.  Neurological:     Mental Status: She is alert and oriented to person, place, and time.  Psychiatric:        Mood and Affect: Mood normal.        Behavior: Behavior normal.        Judgment: Judgment normal.     BP 118/74 (BP Location: Right Arm, Patient Position: Sitting, Cuff Size: Normal)   Pulse 78   Temp 98 F (36.7 C) (Oral)   Resp 16   Ht '5\' 3"'$  (1.6 m)   Wt 167 lb 6.4 oz (75.9 kg)   LMP  (LMP Unknown)   SpO2 98%   BMI 29.65 kg/m  Wt Readings from Last 3 Encounters:  02/21/23 167 lb 6.4 oz (75.9 kg)  02/13/23 168 lb (76.2 kg)  01/23/23 168 lb (76.2 kg)    Diabetic Foot Exam - Simple   No data filed    Lab Results  Component Value Date   WBC 5.8 02/21/2023   HGB 15.1 (H) 02/21/2023   HCT 44.3 02/21/2023   PLT 389.0 02/21/2023   GLUCOSE 87 02/21/2023   CHOL 179 02/21/2023   TRIG 225.0 (H) 02/21/2023   HDL 62.50 02/21/2023   LDLDIRECT 93.0 02/21/2023   LDLCALC 89 10/22/2022   ALT 17 02/21/2023   AST 14 02/21/2023   NA 139 02/21/2023   K 4.6 02/21/2023   CL 102 02/21/2023   CREATININE 0.84 02/21/2023   BUN 12 02/21/2023   CO2 26 02/21/2023   TSH 2.98 02/21/2023   HGBA1C 5.6 02/21/2023    Lab Results  Component Value Date   TSH 2.98 02/21/2023   Lab Results  Component Value Date   WBC 5.8 02/21/2023   HGB 15.1 (H) 02/21/2023   HCT 44.3 02/21/2023    MCV 92.6 02/21/2023   PLT 389.0 02/21/2023   Lab Results  Component Value Date   NA 139 02/21/2023   K 4.6 02/21/2023   CO2 26 02/21/2023  GLUCOSE 87 02/21/2023   BUN 12 02/21/2023   CREATININE 0.84 02/21/2023   BILITOT 0.6 02/21/2023   ALKPHOS 68 02/21/2023   AST 14 02/21/2023   ALT 17 02/21/2023   PROT 7.5 02/21/2023   ALBUMIN 4.4 02/21/2023   CALCIUM 10.3 02/21/2023   GFR 79.02 02/21/2023   Lab Results  Component Value Date   CHOL 179 02/21/2023   Lab Results  Component Value Date   HDL 62.50 02/21/2023   Lab Results  Component Value Date   LDLCALC 89 10/22/2022   Lab Results  Component Value Date   TRIG 225.0 (H) 02/21/2023   Lab Results  Component Value Date   CHOLHDL 3 02/21/2023   Lab Results  Component Value Date   HGBA1C 5.6 02/21/2023      Assessment & Plan:  Allergic Rhinitis: Patient will wait until she has completed her Levofloxacin 5 mg regimen before receiving further immunotherapy allergy injections.  Lymphadenopathy: Levofloxacin 5 mg prescribed today.  Problem List Items Addressed This Visit     Acute cervical lymphadenitis    Below left year, tender, swollen, no concerns in the ear. Started on Levaquin and ultrasound ordered for further evaluation      Anxiety and depression    Doing well on current dose of venlafaxine no changes      Asthma - Primary    No recent exacerbation      Relevant Orders   CBC with Differential/Platelet (Completed)   Hyperglycemia    hgba1c acceptable, minimize simple carbs. Increase exercise as tolerated.      Relevant Orders   Hemoglobin A1c (Completed)   Comprehensive metabolic panel (Completed)   TSH (Completed)   Hyperlipidemia, mixed    Encourage heart healthy diet such as MIND or DASH diet, increase exercise, avoid trans fats, simple carbohydrates and processed foods, consider a krill or fish or flaxseed oil cap daily.       Relevant Orders   Lipid panel (Completed)   TSH  (Completed)   LDL cholesterol, direct (Completed)   Low vitamin D level    Supplement and monitor      Relevant Orders   VITAMIN D 25 Hydroxy (Vit-D Deficiency, Fractures) (Completed)   Perimenopause    Small, frequent heart healthy diet with minimal carbohydrates, increase hydration, exercise and rest      Meds ordered this encounter  Medications   levofloxacin (LEVAQUIN) 250 MG tablet    Sig: Take 1 tablet (250 mg total) by mouth daily.    Dispense:  5 tablet    Refill:  0   I, Penni Homans, MD, personally preformed the services described in this documentation.  All medical record entries made by the scribe were at my direction and in my presence.  I have reviewed the chart and discharge instructions (if applicable) and agree that the record reflects my personal performance and is accurate and complete. 02/21/2023  I,Mohammed Iqbal,acting as a scribe for Penni Homans, MD.,have documented all relevant documentation on the behalf of Penni Homans, MD,as directed by  Penni Homans, MD while in the presence of Penni Homans, MD.  Penni Homans, MD

## 2023-02-21 NOTE — Patient Instructions (Signed)
Menopause Menopause is the normal time of a woman's life when menstrual periods stop completely. It marks the natural end to a woman's ability to become pregnant. It can be defined as the absence of a menstrual period for 12 months without another medical cause. The transition to menopause (perimenopause) most often happens between the ages of 26 and 78, and can last for many years. During perimenopause, hormone levels change in your body, which can cause symptoms and affect your health. Menopause may increase your risk for: Weakened bones (osteoporosis), which causes fractures. Depression. Hardening and narrowing of the arteries (atherosclerosis), which can cause heart attacks and strokes. What are the causes? This condition is usually caused by a natural change in hormone levels that happens as you get older. The condition may also be caused by changes that are not natural, including: Surgery to remove both ovaries (surgical menopause). Side effects from some medicines, such as chemotherapy used to treat cancer (chemical menopause). What increases the risk? This condition is more likely to start at an earlier age if you have certain medical conditions or have undergone treatments, including: A tumor of the pituitary gland in the brain. A disease that affects the ovaries and hormones. Certain cancer treatments, such as chemotherapy or hormone therapy, or radiation therapy on the pelvis. Heavy smoking and excessive alcohol use. Family history of early menopause. This condition is also more likely to develop earlier in women who are very thin. What are the signs or symptoms? Symptoms of this condition include: Hot flashes. Irregular menstrual periods. Night sweats. Changes in feelings about sex. This could be a decrease in sex drive or an increased discomfort around your sexuality. Vaginal dryness and thinning of the vaginal walls. This may cause painful sex. Dryness of the skin and  development of wrinkles. Headaches. Problems sleeping (insomnia). Mood swings or irritability. Memory problems. Weight gain. Hair growth on the face and chest. Bladder infections or problems with urinating. How is this diagnosed? This condition is diagnosed based on your medical history, a physical exam, your age, your menstrual history, and your symptoms. Hormone tests may also be done. How is this treated? In some cases, no treatment is needed. You and your health care provider should make a decision together about whether treatment is necessary. Treatment will be based on your individual condition and preferences. Treatment for this condition focuses on managing symptoms. Treatment may include: Menopausal hormone therapy (MHT). Medicines to treat specific symptoms or complications. Acupuncture. Vitamin or herbal supplements. Before starting treatment, make sure to let your health care provider know if you have a personal or family history of these conditions: Heart disease. Breast cancer. Blood clots. Diabetes. Osteoporosis. Follow these instructions at home: Lifestyle Do not use any products that contain nicotine or tobacco, such as cigarettes, e-cigarettes, and chewing tobacco. If you need help quitting, ask your health care provider. Get at least 30 minutes of physical activity on 5 or more days each week. Avoid alcoholic and caffeinated beverages, as well as spicy foods. This may help prevent hot flashes. Get 7-8 hours of sleep each night. If you have hot flashes, try: Dressing in layers. Avoiding things that may trigger hot flashes, such as spicy food, warm places, or stress. Taking slow, deep breaths when a hot flash starts. Keeping a fan in your home and office. Find ways to manage stress, such as deep breathing, meditation, or journaling. Consider going to group therapy with other women who are having menopause symptoms. Ask your health care  provider about recommended  group therapy meetings. Eating and drinking  Eat a healthy, balanced diet that contains whole grains, lean protein, low-fat dairy, and plenty of fruits and vegetables. Your health care provider may recommend adding more soy to your diet. Foods that contain soy include tofu, tempeh, and soy milk. Eat plenty of foods that contain calcium and vitamin D for bone health. Items that are rich in calcium include low-fat milk, yogurt, beans, almonds, sardines, broccoli, and kale. Medicines Take over-the-counter and prescription medicines only as told by your health care provider. Talk with your health care provider before starting any herbal supplements. If prescribed, take vitamins and supplements as told by your health care provider. General instructions  Keep track of your menstrual periods, including: When they occur. How heavy they are and how long they last. How much time passes between periods. Keep track of your symptoms, noting when they start, how often you have them, and how long they last. Use vaginal lubricants or moisturizers to help with vaginal dryness and improve comfort during sex. Keep all follow-up visits. This is important. This includes any group therapy or counseling. Contact a health care provider if: You are still having menstrual periods after age 4. You have pain during sex. You have not had a period for 12 months and you develop vaginal bleeding. Get help right away if you have: Severe depression. Excessive vaginal bleeding. Pain when you urinate. A fast or irregular heartbeat (palpitations). Severe headaches. Abdominal pain or severe indigestion. Summary Menopause is a normal time of life when menstrual periods stop completely. It is usually defined as the absence of a menstrual period for 12 months without another medical cause. The transition to menopause (perimenopause) most often happens between the ages of 82 and 76 and can last for several years. Symptoms  can be managed through medicines, lifestyle changes, and complementary therapies such as acupuncture. Eat a balanced diet that is rich in nutrients to promote bone health and heart health and to manage symptoms during menopause. This information is not intended to replace advice given to you by your health care provider. Make sure you discuss any questions you have with your health care provider. Document Revised: 09/09/2020 Document Reviewed: 05/26/2020 Elsevier Patient Education  [place].

## 2023-02-26 ENCOUNTER — Ambulatory Visit (INDEPENDENT_AMBULATORY_CARE_PROVIDER_SITE_OTHER): Payer: 59

## 2023-02-26 DIAGNOSIS — J309 Allergic rhinitis, unspecified: Secondary | ICD-10-CM

## 2023-03-01 ENCOUNTER — Ambulatory Visit: Payer: 59

## 2023-03-01 DIAGNOSIS — G4719 Other hypersomnia: Secondary | ICD-10-CM

## 2023-03-01 DIAGNOSIS — G4733 Obstructive sleep apnea (adult) (pediatric): Secondary | ICD-10-CM | POA: Diagnosis not present

## 2023-03-04 ENCOUNTER — Other Ambulatory Visit (HOSPITAL_COMMUNITY): Payer: Self-pay

## 2023-03-04 DIAGNOSIS — L239 Allergic contact dermatitis, unspecified cause: Secondary | ICD-10-CM | POA: Diagnosis not present

## 2023-03-04 DIAGNOSIS — L700 Acne vulgaris: Secondary | ICD-10-CM | POA: Diagnosis not present

## 2023-03-04 DIAGNOSIS — L710 Perioral dermatitis: Secondary | ICD-10-CM | POA: Diagnosis not present

## 2023-03-04 DIAGNOSIS — L2389 Allergic contact dermatitis due to other agents: Secondary | ICD-10-CM | POA: Diagnosis not present

## 2023-03-04 DIAGNOSIS — L218 Other seborrheic dermatitis: Secondary | ICD-10-CM | POA: Diagnosis not present

## 2023-03-04 MED ORDER — KETOCONAZOLE 2 % EX CREA
TOPICAL_CREAM | CUTANEOUS | 1 refills | Status: DC
  Filled 2023-03-04: qty 60, 30d supply, fill #0

## 2023-03-04 MED ORDER — SPIRONOLACTONE 50 MG PO TABS
50.0000 mg | ORAL_TABLET | Freq: Every day | ORAL | 3 refills | Status: DC
  Filled 2023-03-04: qty 30, 30d supply, fill #0
  Filled 2023-04-07: qty 30, 30d supply, fill #1
  Filled 2023-05-03: qty 30, 30d supply, fill #2
  Filled 2023-06-02: qty 30, 30d supply, fill #3

## 2023-03-05 ENCOUNTER — Ambulatory Visit (INDEPENDENT_AMBULATORY_CARE_PROVIDER_SITE_OTHER): Payer: 59

## 2023-03-05 DIAGNOSIS — J309 Allergic rhinitis, unspecified: Secondary | ICD-10-CM | POA: Diagnosis not present

## 2023-03-06 ENCOUNTER — Ambulatory Visit: Payer: Self-pay | Admitting: Allergy

## 2023-03-11 DIAGNOSIS — G4733 Obstructive sleep apnea (adult) (pediatric): Secondary | ICD-10-CM | POA: Diagnosis not present

## 2023-03-12 ENCOUNTER — Ambulatory Visit (INDEPENDENT_AMBULATORY_CARE_PROVIDER_SITE_OTHER): Payer: 59

## 2023-03-12 DIAGNOSIS — J309 Allergic rhinitis, unspecified: Secondary | ICD-10-CM | POA: Diagnosis not present

## 2023-03-17 NOTE — Progress Notes (Unsigned)
Follow Up Note  RE: Tricia Potts MRN: [id number] DOB: 06/09/1971 Date of Office Visit: 03/18/2023  Referring provider: Mosie Lukes, MD Primary care provider: Mosie Lukes, MD  Chief Complaint: No chief complaint on file.  History of Present Illness: I had the pleasure of seeing Tricia Potts for a follow up visit at the [facility] on 03/17/2023. She is a 52 y.o. female, who is being followed for allergic rhinoconjunctivitis, asthma, atopic dermatitis. Her previous allergy office visit was on 12/03/2022 with Dr. Maudie Mercury. Today is a regular follow up visit.  Other allergic rhinitis Perennial rhinoconjunctivitis symptoms for 40+ years which flares in the spring and fall.  Was on AIT at age 19 and age 7s for less than 1 year.  Had some pruritus after the last injection so she stopped.  3 cats and 2 dogs at home.  One of the cats do flare her symptoms. Today's skin testing showed: Positive to grass, weed, ragweed, trees, cat, dog, mold. Start environmental control measures as below. Use over the counter antihistamines such as Zyrtec (cetirizine), Claritin (loratadine), Allegra (fexofenadine), or Xyzal (levocetirizine) daily as needed. May take twice a day during allergy flares. May switch antihistamines every few months. Continue Singulair (montelukast) 10mg  daily at night. Start Ryaltris (olopatadine + mometasone nasal spray combination) 1-2 sprays per nostril twice a day. Sample given. This replaces your other nasal sprays. If this works well for you, then have [facility] ship the medication to your home - prescription already sent in.  Nasal saline spray (i.e., Simply Saline) or nasal saline lavage (i.e., NeilMed) is recommended as needed and prior to medicated nasal sprays. May use over the counter eye drops as needed. Start allergy injections. Let us know when ready to start.  Had a detailed discussion with patient/family that clinical history is suggestive of allergic  rhinitis, and may benefit from allergy immunotherapy (AIT). Discussed in detail regarding the dosing, schedule, side effects (mild to moderate local allergic reaction and rarely systemic allergic reactions including anaphylaxis), and benefits (significant improvement in nasal symptoms, seasonal flares of asthma) of immunotherapy with the patient. There is significant time commitment involved with allergy shots, which includes weekly immunotherapy injections for first 9-12 months and then biweekly to monthly injections for 3-5 years. Consent was signed.   Allergic conjunctivitis of both eyes See assessment and plan as above.   Asthma Usually flares with URIs. No recent prednisone. Today's spirometry was normal.  Daily controller medication(s): none.  May use Airsupra rescue inhaler 2 puffs every 4 to 6 hours as needed for shortness of breath, chest tightness, coughing, and wheezing. Do not use more than 12 puffs in 24 hours. May use Airsupra rescue inhaler 2 puffs 5 to 15 minutes prior to strenuous physical activities. Monitor frequency of use. Rinse mouth after each use. If not covered then use albuterol as before.  Coupon given.   Other atopic dermatitis Follows with dermatology. Today's skin testing showed: Positive to grass, weed, ragweed, trees, cat, dog, mold. Borderline to soy, sesame and milk. Monitor symptoms after you eat these foods.  See below for proper skin care. Continue recommendations as per your dermatologist.  Dupixent at home injections every 2 weeks.  Assessment and Plan: Tricia Potts is a 52 y.o. female with: No problem-specific Assessment & Plan notes found for this encounter.  No follow-ups on file.  No orders of the defined types were placed in this encounter.  Lab Orders  No laboratory test(s) ordered  today    Diagnostics: Spirometry:  Tracings reviewed. Her effort: {Blank single:19197::"Good reproducible efforts.","It was hard to get consistent efforts and there  is a question as to whether this reflects a maximal maneuver.","Poor effort, data can not be interpreted."} FVC: ***L FEV1: ***L, ***% predicted FEV1/FVC ratio: ***% Interpretation: {Blank single:19197::"Spirometry consistent with mild obstructive disease","Spirometry consistent with moderate obstructive disease","Spirometry consistent with severe obstructive disease","Spirometry consistent with possible restrictive disease","Spirometry consistent with mixed obstructive and restrictive disease","Spirometry uninterpretable due to technique","Spirometry consistent with normal pattern","No overt abnormalities noted given today's efforts"}.  Please see scanned spirometry results for details.  Skin Testing: {Blank single:19197::"Select foods","Environmental allergy panel","Environmental allergy panel and select foods","Food allergy panel","None","Deferred due to recent antihistamines use"}. *** Results discussed with patient/family.   Medication List:  Current Outpatient Medications  Medication Sig Dispense Refill   acetaminophen (TYLENOL) 500 MG tablet Take 1,000 mg by mouth in the morning and at bedtime.     albuterol (VENTOLIN HFA) 108 (90 Base) MCG/ACT inhaler Inhale 2 puffs into the lungs every 6 (six) hours as needed for wheezing or shortness of breath. 18 g 2   Albuterol-Budesonide (AIRSUPRA) 90-80 MCG/ACT AERO Inhale 2 puffs into the lungs every 4 (four) hours as needed (coughing, wheezing, chest tightness). Do not exceed 12 puffs in 24 hours. 10.7 g 2   ALPRAZolam (XANAX) 0.25 MG tablet Take 1 tablet by mouth 2 (two) times daily as needed for anxiety. 40 tablet 1   atorvastatin (LIPITOR) 20 MG tablet Take 1 tablet (20 mg total) by mouth at bedtime. 90 tablet 1   Brimonidine Tartrate (LUMIFY) 0.025 % SOLN Place 1 drop into both eyes 2 (two) times daily as needed (eye irritation.).     CALCIUM CITRATE PO Take 600 mg by mouth in the morning.     cefdinir (OMNICEF) 300 MG capsule Take 1 capsule  (300 mg total) by mouth 2 (two) times daily. 20 capsule 0   Cholecalciferol (VITAMIN D) 50 MCG (2000 UT) CAPS Take 1 capsule (2,000 Units total) by mouth in the morning. 30 capsule 5   clindamycin (CLEOCIN T) 1 % external solution Apply a small amount to skin 2 - 3 times a week for acne (Patient taking differently: Apply 1 Application topically daily as needed (acne).) 60 mL 5   Crisaborole 2 % OINT APPLY TO FINGERTIPS 2 TIMES DAILY. 100 g 3   Dermatological Products, Misc. [facility]) lotion Apply 1 Application topically in the morning and at bedtime.     Dupilumab (DUPIXENT) 300 MG/2ML SOPN inject one pen subcutaneously evrey other week 4 mL 4   EPINEPHrine 0.3 mg/0.3 mL IJ SOAJ injection Inject 0.3 mg into the muscle as needed for anaphylaxis. 2 each 1   famotidine (PEPCID) 40 MG tablet Take 1 tablet (40 mg total) by mouth at bedtime. 90 tablet 1   fexofenadine (SM FEXOFENADINE HCL) 180 MG tablet TAKE 1 TABLET BY MOUTH DAILY. (Patient taking differently: Take 180 mg by mouth at bedtime.) 90 tablet 1   hydroxypropyl methylcellulose / hypromellose (ISOPTO TEARS / GONIOVISC) 2.5 % ophthalmic solution Place 1 drop into both eyes 3 (three) times daily as needed for dry eyes.     ibuprofen (ADVIL,MOTRIN) 200 MG tablet Take 400-800 mg by mouth every 6 (six) hours as needed for headache or mild pain.     ketoconazole (NIZORAL) 2 % cream Apply to the face twice a day for flaking and scaling 60 g 1   levocetirizine (XYZAL) 5 MG tablet Take 5 mg by  mouth in the morning.     levofloxacin (LEVAQUIN) 250 MG tablet Take 1 tablet (250 mg total) by mouth daily. 5 tablet 0   montelukast (SINGULAIR) 10 MG tablet Take 1 tablet (10 mg total) by mouth at bedtime as needed. (Patient taking differently: Take 10 mg by mouth at bedtime.) 90 tablet 3   Multiple Vitamin (MULTI-VITAMIN DAILY PO) Take 1 tablet by mouth in the morning.     naphazoline-pheniramine (ALLERGY EYE) 0.025-0.3 % ophthalmic solution Place 1 drop into  both eyes 3 (three) times daily as needed for eye irritation or allergies.     Olopatadine-Mometasone (RYALTRIS) [id number] MCG/ACT SUSP Place 1-2 sprays into the nose in the morning and at bedtime. 29 g 5   predniSONE (DELTASONE) 10 MG tablet Take 4 tablets (40 mg total) by mouth daily with breakfast. 20 tablet 0   Probiotic Product (PROBIOTIC DAILY PO) Take 1 capsule by mouth in the morning. Women's Probiotic     spironolactone (ALDACTONE) 50 MG tablet Take one tablet by mouth once daily 30 tablet 3   venlafaxine XR (EFFEXOR-XR) 75 MG 24 hr capsule Take 1 capsule (75 mg total) by mouth daily with breakfast. 90 capsule 1   No current facility-administered medications for this visit.   Allergies: No Known Allergies I reviewed her past medical history, social history, family history, and environmental history and no significant changes have been reported from her previous visit.  Review of Systems  Constitutional:  Negative for appetite change, chills, fever and unexpected weight change.  HENT:  Positive for congestion, postnasal drip, rhinorrhea and sneezing.   Eyes:  Positive for itching.  Respiratory:  Negative for cough, chest tightness, shortness of breath and wheezing.   Cardiovascular:  Negative for chest pain.  Gastrointestinal:  Negative for abdominal pain.  Genitourinary:  Negative for difficulty urinating.  Skin:  Positive for rash.  Allergic/Immunologic: Positive for environmental allergies.  Neurological:  Negative for headaches.    Objective: LMP  (LMP Unknown)  There is no height or weight on file to calculate BMI. Physical Exam Vitals and nursing note reviewed.  Constitutional:      Appearance: Normal appearance. She is well-developed.  HENT:     Head: Normocephalic and atraumatic.     Right Ear: Tympanic membrane and external ear normal.     Left Ear: Tympanic membrane and external ear normal.     Nose: Nose normal.     Mouth/Throat:     Mouth: Mucous membranes are  moist.     Pharynx: Oropharynx is clear.  Eyes:     Conjunctiva/sclera: Conjunctivae normal.  Cardiovascular:     Rate and Rhythm: Normal rate and regular rhythm.     Heart sounds: Normal heart sounds. No murmur heard.    No friction rub. No gallop.  Pulmonary:     Effort: Pulmonary effort is normal.     Breath sounds: Normal breath sounds. No wheezing, rhonchi or rales.  Musculoskeletal:     Cervical back: Neck supple.  Skin:    General: Skin is warm.     Findings: Rash present.     Comments: Healing scar on left wrist area and bandage on right wrist - recent carpal tunnel surgery.  Neurological:     Mental Status: She is alert and oriented to person, place, and time.  Psychiatric:        Behavior: Behavior normal.    Previous notes and tests were reviewed. The plan was reviewed with the patient/family, and all  questions/concerned were addressed.  It was my pleasure to see Tricia Potts today and participate in her care. Please feel free to contact me with any questions or concerns.  Sincerely,  Rexene Alberts, DO Allergy & Immunology  [facility] office: [place] office: [phone]

## 2023-03-18 ENCOUNTER — Encounter: Payer: Self-pay | Admitting: Allergy

## 2023-03-18 ENCOUNTER — Other Ambulatory Visit: Payer: Self-pay

## 2023-03-18 ENCOUNTER — Ambulatory Visit: Payer: 59 | Admitting: Allergy

## 2023-03-18 VITALS — BP 120/86 | HR 70 | Temp 98.2°F | Resp 18 | Ht 61.0 in | Wt 168.8 lb

## 2023-03-18 DIAGNOSIS — H1013 Acute atopic conjunctivitis, bilateral: Secondary | ICD-10-CM

## 2023-03-18 DIAGNOSIS — L2089 Other atopic dermatitis: Secondary | ICD-10-CM | POA: Diagnosis not present

## 2023-03-18 DIAGNOSIS — J4520 Mild intermittent asthma, uncomplicated: Secondary | ICD-10-CM

## 2023-03-18 DIAGNOSIS — H1010 Acute atopic conjunctivitis, unspecified eye: Secondary | ICD-10-CM

## 2023-03-18 DIAGNOSIS — J309 Allergic rhinitis, unspecified: Secondary | ICD-10-CM

## 2023-03-18 NOTE — Assessment & Plan Note (Signed)
Past history - Usually flares with URIs. No recent prednisone. Interim history - no issues.  Today's spirometry was normal.  Daily controller medication(s): none.  May use Airsupra rescue inhaler 2 puffs every 4 to 6 hours as needed for shortness of breath, chest tightness, coughing, and wheezing. Do not use more than 12 puffs in 24 hours. May use Airsupra rescue inhaler 2 puffs 5 to 15 minutes prior to strenuous physical activities. Monitor frequency of use. Rinse mouth after each use. If not covered then use albuterol as before.

## 2023-03-18 NOTE — Assessment & Plan Note (Addendum)
Past history - Perennial rhinoconjunctivitis symptoms for 40+ years which flares in the spring and fall.  Was on AIT at age 52 and age 46s for less than 1 year.  Had some pruritus after the last injection so she stopped.  3 cats and 2 dogs at home.  2023 skin testing showed: Positive to grass, weed, ragweed, trees, cat, dog, mold. Interim history - Started AIT on 01/30/2023 (G-[id number] and M-C-D) with no issues. Continue environmental control measures as below. Use over the counter antihistamines such as Zyrtec (cetirizine), Claritin (loratadine), Allegra (fexofenadine), or Xyzal (levocetirizine) daily as needed. May take twice a day during allergy flares. May switch antihistamines every few months. May take Singulair (montelukast) 10mg  daily at night as needed.  Continue Ryaltris (olopatadine + mometasone nasal spray combination) 1-2 sprays per nostril twice a day.  Nasal saline spray (i.e., Simply Saline) or nasal saline lavage (i.e., NeilMed) is recommended as needed and prior to medicated nasal sprays. May use over the counter eye drops as needed. Continue allergy injections - given today. Keep follow up with ENT.

## 2023-03-18 NOTE — Assessment & Plan Note (Signed)
Rash on face. No issues with foods. Follows with dermatology. Continue proper skin care. Continue recommendations as per your dermatologist.  Patch testing. Dupixent.

## 2023-03-18 NOTE — Patient Instructions (Addendum)
Environmental allergies 2023 skin testing showed: Positive to grass, weed, ragweed, trees, cat, dog, mold. Continue environmental control measures as below. Use over the counter antihistamines such as Zyrtec (cetirizine), Claritin (loratadine), Allegra (fexofenadine), or Xyzal (levocetirizine) daily as needed. May take twice a day during allergy flares. May switch antihistamines every few months. May take Singulair (montelukast) 10mg  daily at night as needed.  Continue Ryaltris (olopatadine + mometasone nasal spray combination) 1-2 sprays per nostril twice a day.  Nasal saline spray (i.e., Simply Saline) or nasal saline lavage (i.e., NeilMed) is recommended as needed and prior to medicated nasal sprays. May use over the counter eye drops as needed. Continue allergy injections. Keep follow up with ENT.   Asthma Normal breathing test.  Daily controller medication(s): none.  May use Airsupra rescue inhaler 2 puffs every 4 to 6 hours as needed for shortness of breath, chest tightness, coughing, and wheezing. Do not use more than 12 puffs in 24 hours. May use Airsupra rescue inhaler 2 puffs 5 to 15 minutes prior to strenuous physical activities. Monitor frequency of use. Rinse mouth after each use. If not covered then use albuterol as before.  Breathing control goals:  Full participation in all desired activities (may need albuterol before activity) Albuterol use two times or less a week on average (not counting use with activity) Cough interfering with sleep two times or less a month Oral steroids no more than once a year No hospitalizations   Eczema Continue proper skin care. Continue recommendations as per your dermatologist.  Patch testing. Dupixent.   Follow up in 6 months or sooner if needed.    Reducing Pollen Exposure Pollen seasons: trees (spring), grass (summer) and ragweed/weeds (fall). Keep windows closed in your home and car to lower pollen exposure.  Install air conditioning  in the bedroom and throughout the house if possible.  Avoid going out in dry windy days - especially early morning. Pollen counts are highest between 5 - 10 AM and on dry, hot and windy days.  Save outside activities for late afternoon or after a heavy rain, when pollen levels are lower.  Avoid mowing of grass if you have grass pollen allergy. Be aware that pollen can also be transported indoors on people and pets.  Dry your clothes in an automatic dryer rather than hanging them outside where they might collect pollen.  Rinse hair and eyes before bedtime.  Pet Allergen Avoidance: Contrary to popular opinion, there are no "hypoallergenic" breeds of dogs or cats. That is because people are not allergic to an animal's hair, but to an allergen found in the animal's saliva, dander (dead skin flakes) or urine. Pet allergy symptoms typically occur within minutes. For some people, symptoms can build up and become most severe 8 to 12 hours after contact with the animal. People with severe allergies can experience reactions in public places if dander has been transported on the pet owners' clothing. Keeping an animal outdoors is only a partial solution, since homes with pets in the yard still have higher concentrations of animal allergens. Before getting a pet, ask your allergist to determine if you are allergic to animals. If your pet is already considered part of your family, try to minimize contact and keep the pet out of the bedroom and other rooms where you spend a great deal of time. As with dust mites, vacuum carpets often or replace carpet with a hardwood floor, tile or linoleum. High-efficiency particulate air (HEPA) cleaners can reduce allergen levels over  time. While dander and saliva are the source of cat and dog allergens, urine is the source of allergens from rabbits, hamsters, mice and [place] pigs; so ask a non-allergic family member to clean the animal's cage. If you have a pet allergy, talk to  your allergist about the potential for allergy immunotherapy (allergy shots). This strategy can often provide long-term relief.  Mold Control Mold and fungi can grow on a variety of surfaces provided certain temperature and moisture conditions exist.  Outdoor molds grow on plants, decaying vegetation and soil. The major outdoor mold, Alternaria and Cladosporium, are found in very high numbers during hot and dry conditions. Generally, a late summer - fall peak is seen for common outdoor fungal spores. Rain will temporarily lower outdoor mold spore count, but counts rise rapidly when the rainy period ends. The most important indoor molds are Aspergillus and Penicillium. Dark, humid and poorly ventilated basements are ideal sites for mold growth. The next most common sites of mold growth are the bathroom and the kitchen. Outdoor (Seasonal) Mold Control Use air conditioning and keep windows closed. Avoid exposure to decaying vegetation. Avoid leaf raking. Avoid grain handling. Consider wearing a face mask if working in moldy areas.  Indoor (Perennial) Mold Control  Maintain humidity below 50%. Get rid of mold growth on hard surfaces with water, detergent and, if necessary, 5% bleach (do not mix with other cleaners). Then dry the area completely. If mold covers an area more than 10 square feet, consider hiring an indoor environmental professional. For clothing, washing with soap and water is best. If moldy items cannot be cleaned and dried, throw them away. Remove sources e.g. contaminated carpets. Repair and seal leaking roofs or pipes. Using dehumidifiers in damp basements may be helpful, but empty the water and clean units regularly to prevent mildew from forming. All rooms, especially basements, bathrooms and kitchens, require ventilation and cleaning to deter mold and mildew growth. Avoid carpeting on concrete or damp floors, and storing items in damp areas.   Skin care recommendations  Bath  time: Always use lukewarm water. AVOID very hot or cold water. Keep bathing time to 5-10 minutes. Do NOT use bubble bath. Use a mild soap and use just enough to wash the dirty areas. Do NOT scrub skin vigorously.  After bathing, pat dry your skin with a towel. Do NOT rub or scrub the skin.  Moisturizers and prescriptions:  ALWAYS apply moisturizers immediately after bathing (within 3 minutes). This helps to lock-in moisture. Use the moisturizer several times a day over the whole body. Good summer moisturizers include: Aveeno, CeraVe, Cetaphil. Good winter moisturizers include: Aquaphor, Vaseline, Cerave, Cetaphil, Eucerin, Vanicream. When using moisturizers along with medications, the moisturizer should be applied about one hour after applying the medication to prevent diluting effect of the medication or moisturize around where you applied the medications. When not using medications, the moisturizer can be continued twice daily as maintenance.  Laundry and clothing: Avoid laundry products with added color or perfumes. Use unscented hypo-allergenic laundry products such as Tide free, Cheer free & gentle, and All free and clear.  If the skin still seems dry or sensitive, you can try double-rinsing the clothes. Avoid tight or scratchy clothing such as wool. Do not use fabric softeners or dyer sheets.

## 2023-03-19 ENCOUNTER — Other Ambulatory Visit (HOSPITAL_COMMUNITY): Payer: Self-pay

## 2023-03-21 NOTE — Telephone Encounter (Signed)
Moderate OSA with AHI 15.9/h. She needs f/u visit to discuss treatment options. Thanks.

## 2023-03-21 NOTE — Telephone Encounter (Signed)
Tricia Potts,   Sleep study results are back and patient is requesting results/recommendations.  Please advise.  Thank you.

## 2023-03-25 DIAGNOSIS — L239 Allergic contact dermatitis, unspecified cause: Secondary | ICD-10-CM | POA: Diagnosis not present

## 2023-03-28 ENCOUNTER — Ambulatory Visit (INDEPENDENT_AMBULATORY_CARE_PROVIDER_SITE_OTHER): Payer: 59

## 2023-03-28 ENCOUNTER — Other Ambulatory Visit (HOSPITAL_COMMUNITY): Payer: Self-pay

## 2023-03-28 DIAGNOSIS — H6993 Unspecified Eustachian tube disorder, bilateral: Secondary | ICD-10-CM | POA: Insufficient documentation

## 2023-03-28 DIAGNOSIS — L249 Irritant contact dermatitis, unspecified cause: Secondary | ICD-10-CM | POA: Diagnosis not present

## 2023-03-28 DIAGNOSIS — J309 Allergic rhinitis, unspecified: Secondary | ICD-10-CM

## 2023-03-28 DIAGNOSIS — L239 Allergic contact dermatitis, unspecified cause: Secondary | ICD-10-CM | POA: Diagnosis not present

## 2023-03-28 MED ORDER — BETAMETHASONE DIPROPIONATE AUG 0.05 % EX CREA
TOPICAL_CREAM | CUTANEOUS | 1 refills | Status: DC
  Filled 2023-03-28: qty 50, 30d supply, fill #0
  Filled 2023-10-28: qty 50, 30d supply, fill #1

## 2023-04-03 ENCOUNTER — Other Ambulatory Visit (HOSPITAL_COMMUNITY): Payer: Self-pay

## 2023-04-09 ENCOUNTER — Ambulatory Visit (INDEPENDENT_AMBULATORY_CARE_PROVIDER_SITE_OTHER): Payer: 59

## 2023-04-09 ENCOUNTER — Other Ambulatory Visit (HOSPITAL_COMMUNITY): Payer: Self-pay

## 2023-04-09 DIAGNOSIS — J309 Allergic rhinitis, unspecified: Secondary | ICD-10-CM

## 2023-04-09 NOTE — Telephone Encounter (Signed)
Pt called the office stating that she still had not received phone call about the sleep study results. I went over the results with pt per [facility] and have scheduled pt an appt. Nothing further needed.

## 2023-04-15 ENCOUNTER — Encounter: Payer: Self-pay | Admitting: Nurse Practitioner

## 2023-04-15 ENCOUNTER — Ambulatory Visit: Payer: 59 | Admitting: Nurse Practitioner

## 2023-04-15 VITALS — BP 116/78 | HR 79 | Temp 98.2°F | Ht 60.0 in | Wt 169.4 lb

## 2023-04-15 DIAGNOSIS — G4733 Obstructive sleep apnea (adult) (pediatric): Secondary | ICD-10-CM

## 2023-04-15 HISTORY — DX: Obstructive sleep apnea (adult) (pediatric): G47.33

## 2023-04-15 NOTE — Assessment & Plan Note (Signed)
Mildly moderate OSA with AHI 15.9/h and SpO2 low 82%. Reviewed risks of untreated OSA and potential treatment options today. She is interested in oral appliance but unsure if she has orthodontics coverage. She is going to contact her dental and health insurance companies to see if this would be an affordable option for her. If not, she would like to move forward with CPAP therapy. We discussed proper use/care. Reviewed risks/benefits of each therapy. Cautioned on safe driving practices.  Patient Instructions  Your sleep study showed mildly moderate sleep apnea. We discussed treatment options today. Let me know if the oral appliance is something that is covered. Otherwise, we will move forward with CPAP therapy.   We discussed how untreated sleep apnea puts an individual at risk for cardiac arrhthymias, pulm HTN, DM, stroke and increases their risk for daytime accidents. We also briefly reviewed treatment options including weight loss, side sleeping position, oral appliance, CPAP therapy or referral to ENT for possible surgical options.   Use caution when driving and pull over if you become sleepy   Follow up in 12 weeks with Dr. Wynona Neat to establish care for sleep and see how treatment is going, or sooner if needed

## 2023-04-15 NOTE — Telephone Encounter (Signed)
Received a message from patient. He stated that she called [employer] and was informed that they will cover the oral appliance that was discussed during her visit today.   Katie, did you have a preference of whom we should send the referral to?

## 2023-04-15 NOTE — Telephone Encounter (Signed)
I put the order in to Dr. Toni Arthurs. Thanks!!

## 2023-04-15 NOTE — Patient Instructions (Addendum)
Your sleep study showed mildly moderate sleep apnea. We discussed treatment options today. Let me know if the oral appliance is something that is covered. Otherwise, we will move forward with CPAP therapy.   We discussed how untreated sleep apnea puts an individual at risk for cardiac arrhthymias, pulm HTN, DM, stroke and increases their risk for daytime accidents. We also briefly reviewed treatment options including weight loss, side sleeping position, oral appliance, CPAP therapy or referral to ENT for possible surgical options.   Use caution when driving and pull over if you become sleepy   Follow up in 12 weeks with Tricia Potts to establish care for sleep and see how treatment is going, or sooner if needed

## 2023-04-15 NOTE — Progress Notes (Signed)
@Patient  ID: Tricia Potts, female    DOB: April 06, 1971, 52 y.o.   MRN: [id number]  Chief Complaint  Patient presents with   Follow-up    Hst     Referring provider: [name], MD  HPI: 52 year old female, never smoker followed for OSA.  Past medical history significant for allergic rhinitis, asthma, anxiety, depression, HLD.   TEST/EVENTS:  03/01/2023 HST: AHI 15.9/h, SpO2 low 82%  11/13/2022: OV with Breylon Sherrow NP for a sleep consult referred by Dr. Abner Greenspan, her PCP. Her husband is with her; he is also a sleep patient of our's. She has had a longstanding history of trouble sleeping and feeling very fatigued.  She feels poorly rested upon waking.  She also wakes herself up snoring.  Her husband tells her that she stops breathing at night multiple times.  Denies any morning headaches, drowsy driving, sleep parasomnia/paralysis.  No history of narcolepsy or cataplexy. She goes to bed between 9 to 11 PM.  Falls asleep within 30 minutes.  Wakes 2-8 times at night.  Officially without bed in the morning around 6 to 8 AM.  She works as a Occupational psychologist.  Does not operate any heavy machinery.  She is gained about 5 to 10 pounds in the last 2 years.  No sleep aids. No previous sleep studies.  No cardiac history.  No history of stroke.  She is never smoker.  Does not drink any alcohol.  Lives at home with her husband.  Family history of allergies, asthma and cancer.  She does have asthma. Well-controlled currently. She has a head cold right now but denies any increased shortness of breath, chest tightness or wheezing. Denies fevers or chills.  [place] 12  04/15/2023: Today - follow up Patient presents today for follow up to discuss home sleep study results, which revealed mildly moderate OSA with AHI 15.9/h. She feels unchanged compared to how she was last time she was here. Wakes tired and feels fatigued during the day. Denies morning headaches, drowsy driving, sleep  parasomnias/paralysis. She wants to discuss her treatment options.   No Known Allergies  Immunization History  Administered Date(s) Administered   Influenza Whole 09/23/2012, 09/30/2013   Influenza,inj,Quad PF,6+ Mos 09/19/2018   Influenza,inj,Quad PF,6-35 Mos 10/09/2021   Influenza-Unspecified 09/23/2014, 09/26/2015, 09/23/2022   Moderna Sars-Covid-2 Vaccination 11/23/2020   PFIZER(Purple Top)SARS-COV-2 Vaccination 02/01/2020, 12/13/2020   PNEUMOCOCCAL CONJUGATE-20 06/01/2021   Pneumococcal Conjugate-13 10/25/2014   Td 03/07/2010   Tdap 06/21/2014   Zoster Recombinat (Shingrix) 06/10/2020, 06/29/2022    Past Medical History:  Diagnosis Date   Allergy    Anemia 06/18/2014   Anxiety    Anxiety and depression 02/17/2009   Qualifier: Diagnosis of  By: Andrey Campanile MD, LauraLee     Asthma    mild - rarely uses inhaler   Broken ankle 2011   (left) roller skating   Bronchitis, mucopurulent recurrent 04/21/2013   Cervical cancer screening 02/20/2016   Depression    Eczema    GERD (gastroesophageal reflux disease)    Hemorrhoid    History of hidradenitis suppurativa    Hyperlipemia    IBS (irritable bowel syndrome)    Kidney infection    as a child   Low back pain 05/25/2016   Moderate obstructive sleep apnea 04/15/2023   Osteopenia    PCO (polycystic ovaries)    Pneumonia    Pruritus 08/22/2017    Tobacco History: Social History   Tobacco Use  Smoking Status Never  Passive exposure: Past  Smokeless Tobacco Never   Counseling given: Not Answered   Outpatient Medications Prior to Visit  Medication Sig Dispense Refill   acetaminophen (TYLENOL) 500 MG tablet Take 1,000 mg by mouth in the morning and at bedtime.     albuterol (VENTOLIN HFA) 108 (90 Base) MCG/ACT inhaler Inhale 2 puffs into the lungs every 6 (six) hours as needed for wheezing or shortness of breath. 18 g 2   Albuterol-Budesonide (AIRSUPRA) 90-80 MCG/ACT AERO Inhale 2 puffs into the lungs every 4 (four)  hours as needed (coughing, wheezing, chest tightness). Do not exceed 12 puffs in 24 hours. 10.7 g 2   ALPRAZolam (XANAX) 0.25 MG tablet Take 1 tablet by mouth 2 (two) times daily as needed for anxiety. 40 tablet 1   atorvastatin (LIPITOR) 20 MG tablet Take 1 tablet (20 mg total) by mouth at bedtime. 90 tablet 1   augmented betamethasone dipropionate (DIPROLENE-AF) 0.05 % cream Apply to affected areas 2 times a day as needed for rash/flares 50 g 1   Brimonidine Tartrate (LUMIFY) 0.025 % SOLN Place 1 drop into both eyes 2 (two) times daily as needed (eye irritation.).     CALCIUM CITRATE PO Take 600 mg by mouth in the morning.     Cholecalciferol (VITAMIN D) 50 MCG (2000 UT) CAPS Take 1 capsule (2,000 Units total) by mouth in the morning. 30 capsule 5   clindamycin (CLEOCIN T) 1 % external solution Apply a small amount to skin 2 - 3 times a week for acne (Patient taking differently: Apply 1 Application topically daily as needed (acne).) 60 mL 5   Crisaborole 2 % OINT APPLY TO FINGERTIPS 2 TIMES DAILY. 100 g 3   Dermatological Products, Misc. [facility]) lotion Apply 1 Application topically in the morning and at bedtime.     Dupilumab (DUPIXENT) 300 MG/2ML SOPN inject one pen subcutaneously evrey other week 4 mL 4   EPINEPHrine 0.3 mg/0.3 mL IJ SOAJ injection Inject 0.3 mg into the muscle as needed for anaphylaxis. 2 each 1   famotidine (PEPCID) 40 MG tablet Take 1 tablet (40 mg total) by mouth at bedtime. 90 tablet 1   fexofenadine (SM FEXOFENADINE HCL) 180 MG tablet TAKE 1 TABLET BY MOUTH DAILY. (Patient taking differently: Take 180 mg by mouth at bedtime.) 90 tablet 1   hydroxypropyl methylcellulose / hypromellose (ISOPTO TEARS / GONIOVISC) 2.5 % ophthalmic solution Place 1 drop into both eyes 3 (three) times daily as needed for dry eyes.     ibuprofen (ADVIL,MOTRIN) 200 MG tablet Take 400-800 mg by mouth every 6 (six) hours as needed for headache or mild pain.     ketoconazole (NIZORAL) 2 % cream  Apply to the face twice a day for flaking and scaling 60 g 1   levocetirizine (XYZAL) 5 MG tablet Take 5 mg by mouth in the morning.     montelukast (SINGULAIR) 10 MG tablet Take 1 tablet (10 mg total) by mouth at bedtime as needed. (Patient taking differently: Take 10 mg by mouth at bedtime.) 90 tablet 3   Multiple Vitamin (MULTI-VITAMIN DAILY PO) Take 1 tablet by mouth in the morning.     naphazoline-pheniramine (ALLERGY EYE) 0.025-0.3 % ophthalmic solution Place 1 drop into both eyes 3 (three) times daily as needed for eye irritation or allergies.     Olopatadine-Mometasone (RYALTRIS) [id number] MCG/ACT SUSP Place 1-2 sprays into the nose in the morning and at bedtime. 29 g 5   spironolactone (ALDACTONE) 50 MG tablet  Take one tablet by mouth once daily 30 tablet 3   venlafaxine XR (EFFEXOR-XR) 75 MG 24 hr capsule Take 1 capsule (75 mg total) by mouth daily with breakfast. 90 capsule 1   No facility-administered medications prior to visit.     Review of Systems:   Constitutional: No weight loss or gain, night sweats, fevers, chills, or lassitude. +excessive daytime fatigue HEENT: No headaches, difficulty swallowing, tooth/dental problems, or sore throat. No sneezing, itching, ear ache. +nasal congestion/drainage CV:  No chest pain, orthopnea, PND, swelling in lower extremities, anasarca, dizziness, palpitations, syncope Resp: +snoring, witnessed nocturnal apneas; cough. No shortness of breath with exertion or at rest. No excess mucus or change in color of mucus. No hemoptysis. No wheezing.  No chest wall deformity GI:  No heartburn, indigestion, abdominal pain, nausea, vomiting, diarrhea, change in bowel habits, loss of appetite, bloody stools.  GU: No dysuria, change in color of urine, urgency or frequency. Skin: No rash, lesions, ulcerations MSK:  No joint pain or swelling.   Neuro: No dizziness or lightheadedness.  Psych: No depression or anxiety. Mood stable.     Physical Exam:  BP  116/78   Pulse 79   Temp 98.2 F (36.8 C) (Oral)   Ht 5' (1.524 m)   Wt 169 lb 6.4 oz (76.8 kg)   LMP  (LMP Unknown)   SpO2 98%   BMI 33.08 kg/m   GEN: Pleasant, interactive, well-appearing; in no acute distress. HEENT:  Normocephalic and atraumatic. PERRLA. Sclera white. Nasal turbinates pink, moist and patent bilaterally. No rhinorrhea present. Oropharynx erythematous and moist, without exudate or edema. No lesions, ulcerations, or postnasal drip. Mallampati III/IV NECK:  Supple w/ fair ROM. No JVD present. Normal carotid impulses w/o bruits. Thyroid symmetrical with no goiter or nodules palpated. No lymphadenopathy.   CV: RRR, no m/r/g, no peripheral edema. Pulses intact, +2 bilaterally. No cyanosis, pallor or clubbing. PULMONARY:  Unlabored, regular breathing. Clear bilaterally A&P w/o wheezes/rales/rhonchi. No accessory muscle use.  GI: BS present and normoactive. Soft, non-tender to palpation. No organomegaly or masses detected.  MSK: No erythema, warmth or tenderness. Cap refil <2 sec all extrem. No deformities or joint swelling noted.  Neuro: A/Ox3. No focal deficits noted.   Skin: Warm, no lesions or rashe Psych: Normal affect and behavior. Judgement and thought content appropriate.     Lab Results:  CBC    Component Value Date/Time   WBC 5.8 02/21/2023 0948   RBC 4.79 02/21/2023 0948   HGB 15.1 (H) 02/21/2023 0948   HGB 14.5 01/06/2020 0000   HCT 44.3 02/21/2023 0948   HCT 42.0 01/06/2020 0000   PLT 389.0 02/21/2023 0948   PLT 263 01/06/2020 0000   MCV 92.6 02/21/2023 0948   MCV 87 01/06/2020 0000   MCH 30.1 01/06/2020 0000   MCH 27.9 05/31/2014 0913   MCHC 34.1 02/21/2023 0948   RDW 13.2 02/21/2023 0948   RDW 13.0 01/06/2020 0000   LYMPHSABS 2.0 02/21/2023 0948   MONOABS 0.5 02/21/2023 0948   EOSABS 0.2 02/21/2023 0948   BASOSABS 0.0 02/21/2023 0948    BMET    Component Value Date/Time   NA 139 02/21/2023 0948   K 4.6 02/21/2023 0948   CL 102  02/21/2023 0948   CO2 26 02/21/2023 0948   GLUCOSE 87 02/21/2023 0948   BUN 12 02/21/2023 0948   CREATININE 0.84 02/21/2023 0948   CREATININE 0.63 05/31/2014 0913   CALCIUM 10.3 02/21/2023 0948   CALCIUM 9.7 04/07/2010 0000  GFRNONAA 117.65 03/18/2009 0942    BNP No results found for: "BNP"   Imaging:  No results found.        No data to display          No results found for: "NITRICOXIDE"      Assessment & Plan:   Moderate obstructive sleep apnea Mildly moderate OSA with AHI 15.9/h and SpO2 low 82%. Reviewed risks of untreated OSA and potential treatment options today. She is interested in oral appliance but unsure if she has orthodontics coverage. She is going to contact her dental and health insurance companies to see if this would be an affordable option for her. If not, she would like to move forward with CPAP therapy. We discussed proper use/care. Reviewed risks/benefits of each therapy. Cautioned on safe driving practices.  Patient Instructions  Your sleep study showed mildly moderate sleep apnea. We discussed treatment options today. Let me know if the oral appliance is something that is covered. Otherwise, we will move forward with CPAP therapy.   We discussed how untreated sleep apnea puts an individual at risk for cardiac arrhthymias, pulm HTN, DM, stroke and increases their risk for daytime accidents. We also briefly reviewed treatment options including weight loss, side sleeping position, oral appliance, CPAP therapy or referral to ENT for possible surgical options.   Use caution when driving and pull over if you become sleepy   Follow up in 12 weeks with Dr. Wynona Neat to establish care for sleep and see how treatment is going, or sooner if needed    I spent 35 minutes of dedicated to the care of this patient on the date of this encounter to include pre-visit review of records, face-to-face time with the patient discussing conditions above, post visit  ordering of testing, clinical documentation with the electronic health record, making appropriate referrals as documented, and communicating necessary findings to members of the patients care team.  Noemi Chapel, NP 04/15/2023  Pt aware and understands NP's role.

## 2023-04-15 NOTE — Progress Notes (Signed)
04/15/2023: Pt contact the office. Monia Pouch will cover oral appliance for OSA from what she was told. Will move forward with referral to orthodontics for oral appliance fitting.

## 2023-04-16 ENCOUNTER — Ambulatory Visit (INDEPENDENT_AMBULATORY_CARE_PROVIDER_SITE_OTHER): Payer: 59

## 2023-04-16 DIAGNOSIS — J309 Allergic rhinitis, unspecified: Secondary | ICD-10-CM

## 2023-04-16 NOTE — Telephone Encounter (Signed)
Katie stated in message below that she is sending referral to Dr Toni Arthurs.  Will send back to triage so patient can be made aware thru MyChart that [facility] asked for Dr Toni Arthurs and not Dr Myrtis Ser.  Referral faxed to Dr Toni Arthurs today.

## 2023-04-22 DIAGNOSIS — G4733 Obstructive sleep apnea (adult) (pediatric): Secondary | ICD-10-CM

## 2023-04-25 NOTE — Telephone Encounter (Signed)
Katie, can we change the referral to Dr. Myrtis Ser instead  of Dr. Toni Arthurs? Thanks.

## 2023-04-25 NOTE — Telephone Encounter (Signed)
[facility], new referral placed for Tricia Potts. Pt preferred Tricia Potts, cancel previous referral to [place]. Thanks.

## 2023-04-25 NOTE — Telephone Encounter (Signed)
Sorry for the confusion. I'm not sure what happened as I put the order in for Dr. Toni Arthurs and that's who the [facility] sent it to. We can change it to Dr. Myrtis Ser, if that's who she prefers! No problem. Thanks!

## 2023-04-26 ENCOUNTER — Ambulatory Visit (INDEPENDENT_AMBULATORY_CARE_PROVIDER_SITE_OTHER): Payer: 59

## 2023-04-26 DIAGNOSIS — J309 Allergic rhinitis, unspecified: Secondary | ICD-10-CM

## 2023-04-30 ENCOUNTER — Ambulatory Visit (INDEPENDENT_AMBULATORY_CARE_PROVIDER_SITE_OTHER): Payer: 59

## 2023-04-30 DIAGNOSIS — J309 Allergic rhinitis, unspecified: Secondary | ICD-10-CM | POA: Diagnosis not present

## 2023-04-30 NOTE — Telephone Encounter (Signed)
Referral was faxed to Dr Myrtis Ser on 5/3.  Will route back to triage so patient can be made aware thru MyChart.

## 2023-05-03 ENCOUNTER — Other Ambulatory Visit: Payer: Self-pay

## 2023-05-03 ENCOUNTER — Other Ambulatory Visit (HOSPITAL_COMMUNITY): Payer: Self-pay

## 2023-05-03 ENCOUNTER — Other Ambulatory Visit: Payer: Self-pay | Admitting: Family Medicine

## 2023-05-03 DIAGNOSIS — J302 Other seasonal allergic rhinitis: Secondary | ICD-10-CM

## 2023-05-03 MED ORDER — VENLAFAXINE HCL ER 75 MG PO CP24
75.0000 mg | ORAL_CAPSULE | Freq: Every day | ORAL | 1 refills | Status: DC
  Filled 2023-05-03: qty 90, 90d supply, fill #0
  Filled 2023-08-08: qty 90, 90d supply, fill #1

## 2023-05-03 MED ORDER — MONTELUKAST SODIUM 10 MG PO TABS
10.0000 mg | ORAL_TABLET | Freq: Every evening | ORAL | 3 refills | Status: DC | PRN
  Filled 2023-05-03: qty 90, 90d supply, fill #0
  Filled 2023-11-14 – 2023-11-19 (×2): qty 90, 90d supply, fill #1
  Filled 2024-02-21: qty 90, 90d supply, fill #2

## 2023-05-09 ENCOUNTER — Ambulatory Visit (INDEPENDENT_AMBULATORY_CARE_PROVIDER_SITE_OTHER): Payer: 59

## 2023-05-09 DIAGNOSIS — J309 Allergic rhinitis, unspecified: Secondary | ICD-10-CM

## 2023-05-09 DIAGNOSIS — L718 Other rosacea: Secondary | ICD-10-CM | POA: Diagnosis not present

## 2023-05-09 DIAGNOSIS — L239 Allergic contact dermatitis, unspecified cause: Secondary | ICD-10-CM | POA: Diagnosis not present

## 2023-05-09 DIAGNOSIS — L249 Irritant contact dermatitis, unspecified cause: Secondary | ICD-10-CM | POA: Diagnosis not present

## 2023-05-14 ENCOUNTER — Ambulatory Visit (INDEPENDENT_AMBULATORY_CARE_PROVIDER_SITE_OTHER): Payer: 59

## 2023-05-14 DIAGNOSIS — J309 Allergic rhinitis, unspecified: Secondary | ICD-10-CM

## 2023-05-21 ENCOUNTER — Ambulatory Visit (INDEPENDENT_AMBULATORY_CARE_PROVIDER_SITE_OTHER): Payer: 59 | Admitting: *Deleted

## 2023-05-21 DIAGNOSIS — J309 Allergic rhinitis, unspecified: Secondary | ICD-10-CM | POA: Diagnosis not present

## 2023-05-22 NOTE — Assessment & Plan Note (Signed)
Supplement and monitor 

## 2023-05-22 NOTE — Assessment & Plan Note (Signed)
Encouraged to get adequate exercise, calcium and vitamin d intake 

## 2023-05-22 NOTE — Assessment & Plan Note (Signed)
hgba1c acceptable, minimize simple carbs. Increase exercise as tolerated.  

## 2023-05-22 NOTE — Assessment & Plan Note (Signed)
Encourage heart healthy diet such as MIND or DASH diet, increase exercise, avoid trans fats, simple carbohydrates and processed foods, consider a krill or fish or flaxseed oil cap daily.  °

## 2023-05-23 ENCOUNTER — Encounter: Payer: Self-pay | Admitting: Family Medicine

## 2023-05-23 ENCOUNTER — Ambulatory Visit (INDEPENDENT_AMBULATORY_CARE_PROVIDER_SITE_OTHER): Payer: 59 | Admitting: Family Medicine

## 2023-05-23 VITALS — BP 128/80 | HR 81 | Temp 98.0°F | Resp 16 | Ht 61.0 in | Wt 164.2 lb

## 2023-05-23 DIAGNOSIS — E782 Mixed hyperlipidemia: Secondary | ICD-10-CM | POA: Diagnosis not present

## 2023-05-23 DIAGNOSIS — M8580 Other specified disorders of bone density and structure, unspecified site: Secondary | ICD-10-CM | POA: Diagnosis not present

## 2023-05-23 DIAGNOSIS — G5603 Carpal tunnel syndrome, bilateral upper limbs: Secondary | ICD-10-CM

## 2023-05-23 DIAGNOSIS — G4733 Obstructive sleep apnea (adult) (pediatric): Secondary | ICD-10-CM

## 2023-05-23 DIAGNOSIS — L719 Rosacea, unspecified: Secondary | ICD-10-CM | POA: Diagnosis not present

## 2023-05-23 DIAGNOSIS — L309 Dermatitis, unspecified: Secondary | ICD-10-CM

## 2023-05-23 DIAGNOSIS — R739 Hyperglycemia, unspecified: Secondary | ICD-10-CM | POA: Diagnosis not present

## 2023-05-23 DIAGNOSIS — E559 Vitamin D deficiency, unspecified: Secondary | ICD-10-CM

## 2023-05-23 LAB — CBC WITH DIFFERENTIAL/PLATELET
Basophils Absolute: 0 10*3/uL (ref 0.0–0.1)
Basophils Relative: 0.6 % (ref 0.0–3.0)
Eosinophils Absolute: 0.2 10*3/uL (ref 0.0–0.7)
Eosinophils Relative: 2.9 % (ref 0.0–5.0)
HCT: 44.8 % (ref 36.0–46.0)
Hemoglobin: 15 g/dL (ref 12.0–15.0)
Lymphocytes Relative: 35.5 % (ref 12.0–46.0)
Lymphs Abs: 2.2 10*3/uL (ref 0.7–4.0)
MCHC: 33.6 g/dL (ref 30.0–36.0)
MCV: 91.5 fl (ref 78.0–100.0)
Monocytes Absolute: 0.4 10*3/uL (ref 0.1–1.0)
Monocytes Relative: 7.4 % (ref 3.0–12.0)
Neutro Abs: 3.2 10*3/uL (ref 1.4–7.7)
Neutrophils Relative %: 53.6 % (ref 43.0–77.0)
Platelets: 333 10*3/uL (ref 150.0–400.0)
RBC: 4.9 Mil/uL (ref 3.87–5.11)
RDW: 12.9 % (ref 11.5–15.5)
WBC: 6.1 10*3/uL (ref 4.0–10.5)

## 2023-05-23 LAB — LDL CHOLESTEROL, DIRECT: Direct LDL: 94 mg/dL

## 2023-05-23 LAB — COMPREHENSIVE METABOLIC PANEL
ALT: 20 U/L (ref 0–35)
AST: 21 U/L (ref 0–37)
Albumin: 4.8 g/dL (ref 3.5–5.2)
Alkaline Phosphatase: 65 U/L (ref 39–117)
BUN: 15 mg/dL (ref 6–23)
CO2: 26 mEq/L (ref 19–32)
Calcium: 10.4 mg/dL (ref 8.4–10.5)
Chloride: 103 mEq/L (ref 96–112)
Creatinine, Ser: 0.74 mg/dL (ref 0.40–1.20)
GFR: 91.85 mL/min (ref >60.00)
Glucose, Bld: 83 mg/dL (ref 70–99)
Potassium: 4.5 mEq/L (ref 3.5–5.1)
Sodium: 139 mEq/L (ref 135–145)
Total Bilirubin: 0.6 mg/dL (ref 0.2–1.2)
Total Protein: 7.9 g/dL (ref 6.0–8.3)

## 2023-05-23 LAB — HEMOGLOBIN A1C: Hgb A1c MFr Bld: 6 % (ref 4.6–6.5)

## 2023-05-23 LAB — VITAMIN D 25 HYDROXY (VIT D DEFICIENCY, FRACTURES): VITD: 40.31 ng/mL (ref 30.00–100.00)

## 2023-05-23 LAB — LIPID PANEL
Cholesterol: 166 mg/dL (ref 0–200)
HDL: 51 mg/dL (ref >39.00)
NonHDL: 114.89
Total CHOL/HDL Ratio: 3
Triglycerides: 228 mg/dL — ABNORMAL HIGH (ref 0.0–149.0)
VLDL: 45.6 mg/dL — ABNORMAL HIGH (ref 0.0–40.0)

## 2023-05-23 LAB — TSH: TSH: 2.1 u[IU]/mL (ref 0.35–5.50)

## 2023-05-23 NOTE — Assessment & Plan Note (Signed)
New diagnosis she feels it is largely triggered by stress

## 2023-05-23 NOTE — Patient Instructions (Signed)
[employer] for safe personal care products  Goop sunscreen

## 2023-05-23 NOTE — Assessment & Plan Note (Addendum)
Has been seen by dermatology Dr Zannie Kehr and skin testing showed allergies to Imidazolidinyl Urea and 4-Phenylenediamine base with treatment she is improving

## 2023-05-23 NOTE — Assessment & Plan Note (Signed)
Moderate severity is awaiting dental device from Dr Myrtis Ser for treatment

## 2023-05-23 NOTE — Progress Notes (Signed)
Subjective:   By signing my name below, I, Tricia Potts, attest that this documentation has been prepared under the direction and in the presence of Tricia Canary, MD., 05/23/2023.   Patient ID: Tricia Potts, female    DOB: Apr 12, 1971, 52 y.o.   MRN: [id number]  Chief Complaint  Patient presents with   Follow-up    Follow up   HPI Patient is in today for an office visit. She denies recent hospitalization, febrile illness, CP/palpitations/SOB/HA/fever/chills/GI or GU symptoms.   Bilateral Carpal Tunnel Release Patient is doing well since undergoing bilateral carpal tunnel release surgery on 11/06/2023 and denies any associated inflammation or pain.  Sleep Apnea Patient continues to follow with pulmonologist Dr. Micheline Maze to manage moderate sleep apnea and has decided to move forward with oral appliance. She has been seeing orthodontist Dr. Althea Potts for this.  Rosacea Patient has been seeing dermatologist Dr. Zannie Kehr to manage recently diagnosed rosacea. She suspects this is stress-induced but denies any itchiness or rash today. Additionally, she recently underwent allergy patch testing which revealed that she is allergic to phenylenediamine, imidazole, and certain steroids.  Vitamin D Deficiency Patient reports that she has increased daily vitamin D from 4000 IU to 5000 IU which has been tolerable. Blood work will recheck vitamin D levels.  Past Medical History:  Diagnosis Date   Allergy    Anemia 06/18/2014   Anxiety    Anxiety and depression 02/17/2009   Qualifier: Diagnosis of  By: Andrey Campanile MD, LauraLee     Asthma    mild - rarely uses inhaler   Broken ankle 2011   (left) roller skating   Bronchitis, mucopurulent recurrent (HCC) 04/21/2013   Cervical cancer screening 02/20/2016   Depression    Eczema    GERD (gastroesophageal reflux disease)    Hemorrhoid    History of hidradenitis suppurativa    Hyperlipemia    IBS (irritable bowel syndrome)    Kidney  infection    as a child   Low back pain 05/25/2016   Moderate obstructive sleep apnea 04/15/2023   Osteopenia    PCO (polycystic ovaries)    Pneumonia    Pruritus 08/22/2017    Past Surgical History:  Procedure Laterality Date   ANKLE SURGERY Left    plate and 8 screws in left ankle   CARPAL TUNNEL RELEASE Left 11/05/2022   Procedure: Carpel Tunnel Release - left;  Surgeon: Tia Alert, MD;  Location: [facility] OR;  Service: Neurosurgery;  Laterality: Left;   EYE SURGERY     Lasik   HEMORRHOID SURGERY     WISDOM TOOTH EXTRACTION      Family History  Problem Relation Age of Onset   Allergic rhinitis Mother    Eczema Mother    Asthma Mother    Allergies Mother    COPD Mother    Irritable bowel syndrome Mother    Allergic rhinitis Father    Heart disease Father        mitral valve disease/rupture during physical stress   GER disease Father    Irritable bowel syndrome Father    Heart failure Father    Allergic rhinitis Sister    Allergies Sister    Eczema Sister    Cancer Sister 56       breast, DCIS   Allergic rhinitis Sister    Allergic rhinitis Brother    Hepatitis C Brother    Cancer Brother 17       ALL  Cancer Maternal Grandmother        ovarian cancer   Heart disease Maternal Grandfather        MI at 80   Kidney disease Paternal Grandmother    Kidney disease Paternal Grandfather        possible kidney cancer   Heart disease Daughter        asd s/p repair at age 52   Anxiety disorder Daughter    Allergies Daughter    Irritable bowel syndrome Niece    Ovarian cancer Other        Grandmother   Arthritis Other    Colon cancer Neg Hx    Rectal cancer Neg Hx    Esophageal cancer Neg Hx    Stomach cancer Neg Hx     Social History   Socioeconomic History   Marital status: Married    Spouse name: Not on file   Number of children: 3   Years of education: Not on file   Highest education level: Bachelor's degree (e.g., BA, AB, BS)  Occupational History    Occupation: Teacher, adult education: [employer]  Tobacco Use   Smoking status: Never    Passive exposure: Past   Smokeless tobacco: Never  Vaping Use   Vaping Use: Never used  Substance and Sexual Activity   Alcohol use: No   Drug use: No   Sexual activity: Yes    Partners: Male    Birth control/protection: Post-menopausal  Other Topics Concern   Not on file  Social History Narrative   Works in interventional radiology-RN [employer]   Married   3 daughters   Social Determinants of Health   Financial Resource Strain: Low Risk  (05/22/2023)   Overall Financial Resource Strain (CARDIA)    Difficulty of Paying Living Expenses: Not hard at all  Food Insecurity: No Food Insecurity (05/22/2023)   Hunger Vital Sign    Worried About Running Out of Food in the Last Year: Never true    Ran Out of Food in the Last Year: Never true  Transportation Needs: No Transportation Needs (05/22/2023)   PRAPARE - Administrator, Civil Service (Medical): No    Lack of Transportation (Non-Medical): No  Physical Activity: Unknown (05/22/2023)   Exercise Vital Sign    Days of Exercise per Week: 4 days    Minutes of Exercise per Session: Patient declined  Stress: No Stress Concern Present (05/22/2023)   [employer] of Occupational Health - Occupational Stress Questionnaire    Feeling of Stress : Only a little  Social Connections: Socially Integrated (05/22/2023)   Social Connection and Isolation Panel [NHANES]    Frequency of Communication with Friends and Family: More than three times a week    Frequency of Social Gatherings with Friends and Family: Twice a week    Attends Religious Services: More than 4 times per year    Active Member of [employer] or Organizations: Yes    Attends Engineer, structural: More than 4 times per year    Marital Status: Married  Catering manager Violence: Not on file    Outpatient Medications Prior to Visit  Medication Sig Dispense Refill   acetaminophen  (TYLENOL) 500 MG tablet Take 1,000 mg by mouth in the morning and at bedtime.     albuterol (VENTOLIN HFA) 108 (90 Base) MCG/ACT inhaler Inhale 2 puffs into the lungs every 6 (six) hours as needed for wheezing or shortness of breath. 18 g 2  Albuterol-Budesonide (AIRSUPRA) 90-80 MCG/ACT AERO Inhale 2 puffs into the lungs every 4 (four) hours as needed (coughing, wheezing, chest tightness). Do not exceed 12 puffs in 24 hours. 10.7 g 2   ALPRAZolam (XANAX) 0.25 MG tablet Take 1 tablet by mouth 2 (two) times daily as needed for anxiety. 40 tablet 1   atorvastatin (LIPITOR) 20 MG tablet Take 1 tablet (20 mg total) by mouth at bedtime. 90 tablet 1   augmented betamethasone dipropionate (DIPROLENE-AF) 0.05 % cream Apply to affected areas 2 times a day as needed for rash/flares 50 g 1   Brimonidine Tartrate (LUMIFY) 0.025 % SOLN Place 1 drop into both eyes 2 (two) times daily as needed (eye irritation.).     CALCIUM CITRATE PO Take 600 mg by mouth in the morning.     Cholecalciferol (VITAMIN D) 50 MCG (2000 UT) CAPS Take 1 capsule (2,000 Units total) by mouth in the morning. 30 capsule 5   clindamycin (CLEOCIN T) 1 % external solution Apply a small amount to skin 2 - 3 times a week for acne (Patient taking differently: Apply 1 Application topically daily as needed (acne).) 60 mL 5   Crisaborole 2 % OINT APPLY TO FINGERTIPS 2 TIMES DAILY. 100 g 3   Dermatological Products, Misc. [facility]) lotion Apply 1 Application topically in the morning and at bedtime.     EPINEPHrine 0.3 mg/0.3 mL IJ SOAJ injection Inject 0.3 mg into the muscle as needed for anaphylaxis. 2 each 1   famotidine (PEPCID) 40 MG tablet Take 1 tablet (40 mg total) by mouth at bedtime. 90 tablet 1   hydroxypropyl methylcellulose / hypromellose (ISOPTO TEARS / GONIOVISC) 2.5 % ophthalmic solution Place 1 drop into both eyes 3 (three) times daily as needed for dry eyes.     ibuprofen (ADVIL,MOTRIN) 200 MG tablet Take 400-800 mg by mouth every 6  (six) hours as needed for headache or mild pain.     ketoconazole (NIZORAL) 2 % cream Apply to the face twice a day for flaking and scaling 60 g 1   montelukast (SINGULAIR) 10 MG tablet Take 1 tablet (10 mg total) by mouth at bedtime as needed. 90 tablet 3   Multiple Vitamin (MULTI-VITAMIN DAILY PO) Take 1 tablet by mouth in the morning.     naphazoline-pheniramine (ALLERGY EYE) 0.025-0.3 % ophthalmic solution Place 1 drop into both eyes 3 (three) times daily as needed for eye irritation or allergies.     Olopatadine-Mometasone (RYALTRIS) [id number] MCG/ACT SUSP Place 1-2 sprays into the nose in the morning and at bedtime. 29 g 5   spironolactone (ALDACTONE) 50 MG tablet Take one tablet by mouth once daily 30 tablet 3   venlafaxine XR (EFFEXOR-XR) 75 MG 24 hr capsule Take 1 capsule (75 mg total) by mouth daily with breakfast. 90 capsule 1   Dupilumab (DUPIXENT) 300 MG/2ML SOPN inject one pen subcutaneously evrey other week 4 mL 4   fexofenadine (SM FEXOFENADINE HCL) 180 MG tablet TAKE 1 TABLET BY MOUTH DAILY. (Patient taking differently: Take 180 mg by mouth at bedtime.) 90 tablet 1   levocetirizine (XYZAL) 5 MG tablet Take 5 mg by mouth in the morning.     No facility-administered medications prior to visit.    No Known Allergies  Review of Systems  Constitutional:  Negative for chills and fever.  Respiratory:  Negative for shortness of breath.   Cardiovascular:  Negative for chest pain and palpitations.  Gastrointestinal:  Negative for abdominal pain, blood in stool, constipation,  diarrhea, nausea and vomiting.  Genitourinary:  Negative for dysuria, frequency, hematuria and urgency.  Skin:  Negative for itching and rash.          Neurological:  Negative for headaches.       Objective:    Physical Exam Constitutional:      General: She is not in acute distress.    Appearance: Normal appearance. She is not ill-appearing.  HENT:     Head: Normocephalic and atraumatic.     Right Ear:  External ear normal.     Left Ear: External ear normal.     Nose: Nose normal.     Mouth/Throat:     Mouth: Mucous membranes are moist.     Pharynx: Oropharynx is clear.  Eyes:     General:        Right eye: No discharge.        Left eye: No discharge.     Extraocular Movements: Extraocular movements intact.     Conjunctiva/sclera: Conjunctivae normal.     Pupils: Pupils are equal, round, and reactive to light.  Cardiovascular:     Rate and Rhythm: Normal rate and regular rhythm.     Pulses: Normal pulses.     Heart sounds: Normal heart sounds. No murmur heard.    No gallop.  Pulmonary:     Effort: Pulmonary effort is normal. No respiratory distress.     Breath sounds: Normal breath sounds. No wheezing or rales.  Abdominal:     General: Bowel sounds are normal.     Palpations: Abdomen is soft.     Tenderness: There is no abdominal tenderness. There is no guarding.  Musculoskeletal:        General: Normal range of motion.     Cervical back: Normal range of motion.     Right lower leg: No edema.     Left lower leg: No edema.  Skin:    General: Skin is warm and dry.  Neurological:     Mental Status: She is alert and oriented to person, place, and time.  Psychiatric:        Mood and Affect: Mood normal.        Behavior: Behavior normal.        Judgment: Judgment normal.     BP 128/80 (BP Location: Right Arm, Patient Position: Sitting, Cuff Size: Normal)   Pulse 81   Temp 98 F (36.7 C) (Oral)   Resp 16   Ht 5\' 1"  (1.549 m)   Wt 164 lb 3.2 oz (74.5 kg)   LMP  (LMP Unknown)   SpO2 97%   BMI 31.03 kg/m  Wt Readings from Last 3 Encounters:  05/23/23 164 lb 3.2 oz (74.5 kg)  04/15/23 169 lb 6.4 oz (76.8 kg)  03/18/23 168 lb 12.8 oz (76.6 kg)    Diabetic Foot Exam - Simple   No data filed    Lab Results  Component Value Date   WBC 5.8 02/21/2023   HGB 15.1 (H) 02/21/2023   HCT 44.3 02/21/2023   PLT 389.0 02/21/2023   GLUCOSE 87 02/21/2023   CHOL 179  02/21/2023   TRIG 225.0 (H) 02/21/2023   HDL 62.50 02/21/2023   LDLDIRECT 93.0 02/21/2023   LDLCALC 89 10/22/2022   ALT 17 02/21/2023   AST 14 02/21/2023   NA 139 02/21/2023   K 4.6 02/21/2023   CL 102 02/21/2023   CREATININE 0.84 02/21/2023   BUN 12 02/21/2023   CO2 26 02/21/2023  TSH 2.98 02/21/2023   HGBA1C 5.6 02/21/2023    Lab Results  Component Value Date   TSH 2.98 02/21/2023   Lab Results  Component Value Date   WBC 5.8 02/21/2023   HGB 15.1 (H) 02/21/2023   HCT 44.3 02/21/2023   MCV 92.6 02/21/2023   PLT 389.0 02/21/2023   Lab Results  Component Value Date   NA 139 02/21/2023   K 4.6 02/21/2023   CO2 26 02/21/2023   GLUCOSE 87 02/21/2023   BUN 12 02/21/2023   CREATININE 0.84 02/21/2023   BILITOT 0.6 02/21/2023   ALKPHOS 68 02/21/2023   AST 14 02/21/2023   ALT 17 02/21/2023   PROT 7.5 02/21/2023   ALBUMIN 4.4 02/21/2023   CALCIUM 10.3 02/21/2023   GFR 79.02 02/21/2023   Lab Results  Component Value Date   CHOL 179 02/21/2023   Lab Results  Component Value Date   HDL 62.50 02/21/2023   Lab Results  Component Value Date   LDLCALC 89 10/22/2022   Lab Results  Component Value Date   TRIG 225.0 (H) 02/21/2023   Lab Results  Component Value Date   CHOLHDL 3 02/21/2023   Lab Results  Component Value Date   HGBA1C 5.6 02/21/2023      Assessment & Plan:  Labs: Routine blood work ordered.  Healthy Lifestyle: Encouraged 6-8 hours of sleep, heart healthy diet, 60-80 oz of non-alcohol/non-caffeinated fluids, and 4000-8000 steps daily.  Rosacea: Patient is managing this with dermatologist Dr. Zannie Kehr.  Sleep Apnea: Patient follows with pulmonologist Dr. Micheline Maze and has decided to move forward with oral appliance. She is seeing orthodontist Dr. Althea Potts for this.  Vitamin D Deficiency: Patient has recently increased from taking vitamin D 4000 IU to 5000 IU daily. Blood work will recheck vitamin D level. Problem List Items  Addressed This Visit     Hyperglycemia - Primary    hgba1c acceptable, minimize simple carbs. Increase exercise as tolerated.      Relevant Orders   Hemoglobin A1c   Hyperlipidemia, mixed    Encourage heart healthy diet such as MIND or DASH diet, increase exercise, avoid trans fats, simple carbohydrates and processed foods, consider a krill or fish or flaxseed oil cap daily.       Relevant Orders   Lipid panel   Osteopenia    Encouraged to get adequate exercise, calcium and vitamin d intake       Vitamin D deficiency    Supplement and monitor       Relevant Orders   VITAMIN D 25 Hydroxy (Vit-D Deficiency, Fractures)   CTS (carpal tunnel syndrome)    Has had bilateral surgery and is doing much better      Dermatitis    Has been seen by dermatology Dr Zannie Kehr and skin testing showed allergies to Imidazolidinyl Urea and 4-Phenylenediamine base with treatment she is improving      Relevant Orders   CBC with Differential/Platelet   Comprehensive metabolic panel   TSH   OSA (obstructive sleep apnea)    Moderate severity is awaiting dental device from Dr Myrtis Ser for treatment      Rosacea    New diagnosis she feels it is largely triggered by stress      No orders of the defined types were placed in this encounter.  I, Danise Edge, MD, personally preformed the services described in this documentation.  All medical record entries made by the scribe were at my direction and in my  presence.  I have reviewed the chart and discharge instructions (if applicable) and agree that the record reflects my personal performance and is accurate and complete. 05/23/2023  I,Mohammed Iqbal,acting as a scribe for Danise Edge, MD.,have documented all relevant documentation on the behalf of Danise Edge, MD,as directed by  Danise Edge, MD while in the presence of Danise Edge, MD.  Danise Edge, MD

## 2023-05-23 NOTE — Assessment & Plan Note (Signed)
Has had bilateral surgery and is doing much better

## 2023-05-24 ENCOUNTER — Other Ambulatory Visit: Payer: Self-pay

## 2023-05-24 ENCOUNTER — Other Ambulatory Visit (HOSPITAL_COMMUNITY): Payer: Self-pay

## 2023-05-24 ENCOUNTER — Encounter: Payer: Self-pay | Admitting: Family Medicine

## 2023-05-24 MED ORDER — ATORVASTATIN CALCIUM 20 MG PO TABS
20.0000 mg | ORAL_TABLET | Freq: Every evening | ORAL | 1 refills | Status: DC
  Filled 2023-05-24: qty 90, 90d supply, fill #0
  Filled 2023-08-19: qty 90, 90d supply, fill #1

## 2023-05-30 ENCOUNTER — Ambulatory Visit (INDEPENDENT_AMBULATORY_CARE_PROVIDER_SITE_OTHER): Payer: 59

## 2023-05-30 DIAGNOSIS — J309 Allergic rhinitis, unspecified: Secondary | ICD-10-CM | POA: Diagnosis not present

## 2023-06-03 ENCOUNTER — Other Ambulatory Visit: Payer: Self-pay

## 2023-06-04 ENCOUNTER — Ambulatory Visit (INDEPENDENT_AMBULATORY_CARE_PROVIDER_SITE_OTHER): Payer: 59

## 2023-06-04 DIAGNOSIS — J309 Allergic rhinitis, unspecified: Secondary | ICD-10-CM | POA: Diagnosis not present

## 2023-06-11 ENCOUNTER — Ambulatory Visit (INDEPENDENT_AMBULATORY_CARE_PROVIDER_SITE_OTHER): Payer: 59 | Admitting: *Deleted

## 2023-06-11 DIAGNOSIS — J309 Allergic rhinitis, unspecified: Secondary | ICD-10-CM

## 2023-06-19 ENCOUNTER — Encounter: Payer: Self-pay | Admitting: Family Medicine

## 2023-06-25 ENCOUNTER — Ambulatory Visit (INDEPENDENT_AMBULATORY_CARE_PROVIDER_SITE_OTHER): Payer: 59

## 2023-06-25 DIAGNOSIS — J309 Allergic rhinitis, unspecified: Secondary | ICD-10-CM

## 2023-07-01 ENCOUNTER — Encounter: Payer: Self-pay | Admitting: Pulmonary Disease

## 2023-07-01 ENCOUNTER — Ambulatory Visit: Payer: 59 | Admitting: Pulmonary Disease

## 2023-07-01 VITALS — BP 118/82 | HR 65 | Ht 63.25 in | Wt 164.4 lb

## 2023-07-01 DIAGNOSIS — G4733 Obstructive sleep apnea (adult) (pediatric): Secondary | ICD-10-CM | POA: Diagnosis not present

## 2023-07-01 DIAGNOSIS — G4719 Other hypersomnia: Secondary | ICD-10-CM

## 2023-07-01 NOTE — Progress Notes (Signed)
Tricia Potts    [id number]    May 09, 1971  Primary Care Physician:Blyth, Bryon Lions, MD  Referring Physician: [name], MD [address] [place],  [place] [address]  Chief complaint:   Follow-up for obstructive sleep apnea  HPI:  Patient with moderate obstructive sleep apnea In the process of obtaining an oral device for the treatment of sleep disordered breathing  She has been seen by a dentist, and oral device has been fashioned -Has not been able to pick device up yet because she did develop COVID -Did have mild symptoms -Symptoms of infection have resolved  Usually goes to bed about 9 to 10 PM, wakes up at 5 AM Not rested in the morning  Sleep study was performed 03/01/2023 showing AHI of 15.9, lowest SpO2 was 82%  She does not have history of fatigue, daytime sleepiness She is a nurse  Non-smoker   Outpatient Encounter Medications as of 07/01/2023  Medication Sig   acetaminophen (TYLENOL) 500 MG tablet Take 1,000 mg by mouth in the morning and at bedtime.   albuterol (VENTOLIN HFA) 108 (90 Base) MCG/ACT inhaler Inhale 2 puffs into the lungs every 6 (six) hours as needed for wheezing or shortness of breath.   Albuterol-Budesonide (AIRSUPRA) 90-80 MCG/ACT AERO Inhale 2 puffs into the lungs every 4 (four) hours as needed (coughing, wheezing, chest tightness). Do not exceed 12 puffs in 24 hours.   ALPRAZolam (XANAX) 0.25 MG tablet Take 1 tablet by mouth 2 (two) times daily as needed for anxiety.   atorvastatin (LIPITOR) 20 MG tablet Take 1 tablet (20 mg total) by mouth at bedtime.   augmented betamethasone dipropionate (DIPROLENE-AF) 0.05 % cream Apply to affected areas 2 times a day as needed for rash/flares   Brimonidine Tartrate (LUMIFY) 0.025 % SOLN Place 1 drop into both eyes 2 (two) times daily as needed (eye irritation.).   CALCIUM CITRATE PO Take 600 mg by mouth in the morning.   Cholecalciferol (VITAMIN D) 50 MCG (2000 UT) CAPS Take 1  capsule (2,000 Units total) by mouth in the morning.   clindamycin (CLEOCIN T) 1 % external solution Apply a small amount to skin 2 - 3 times a week for acne (Patient taking differently: Apply 1 Application topically daily as needed (acne).)   Crisaborole 2 % OINT APPLY TO FINGERTIPS 2 TIMES DAILY.   Dermatological Products, Misc. [facility]) lotion Apply 1 Application topically in the morning and at bedtime.   EPINEPHrine 0.3 mg/0.3 mL IJ SOAJ injection Inject 0.3 mg into the muscle as needed for anaphylaxis.   famotidine (PEPCID) 40 MG tablet Take 1 tablet (40 mg total) by mouth at bedtime.   hydroxypropyl methylcellulose / hypromellose (ISOPTO TEARS / GONIOVISC) 2.5 % ophthalmic solution Place 1 drop into both eyes 3 (three) times daily as needed for dry eyes.   ibuprofen (ADVIL,MOTRIN) 200 MG tablet Take 400-800 mg by mouth every 6 (six) hours as needed for headache or mild pain.   ketoconazole (NIZORAL) 2 % cream Apply to the face twice a day for flaking and scaling   montelukast (SINGULAIR) 10 MG tablet Take 1 tablet (10 mg total) by mouth at bedtime as needed.   Multiple Vitamin (MULTI-VITAMIN DAILY PO) Take 1 tablet by mouth in the morning.   naphazoline-pheniramine (ALLERGY EYE) 0.025-0.3 % ophthalmic solution Place 1 drop into both eyes 3 (three) times daily as needed for eye irritation or allergies.   Olopatadine-Mometasone (RYALTRIS)  536-64 MCG/ACT SUSP Place 1-2 sprays into the nose in the morning and at bedtime.   spironolactone (ALDACTONE) 50 MG tablet Take one tablet by mouth once daily   venlafaxine XR (EFFEXOR-XR) 75 MG 24 hr capsule Take 1 capsule (75 mg total) by mouth daily with breakfast.   No facility-administered encounter medications on file as of 07/01/2023.    Allergies as of 07/01/2023   (No Known Allergies)    Past Medical History:  Diagnosis Date   Allergy    Anemia 06/18/2014   Anxiety    Anxiety and depression 02/17/2009   Qualifier: Diagnosis of  By: Andrey Campanile  MD, LauraLee     Asthma    mild - rarely uses inhaler   Broken ankle 2011   (left) roller skating   Bronchitis, mucopurulent recurrent (HCC) 04/21/2013   Cervical cancer screening 02/20/2016   Depression    Eczema    GERD (gastroesophageal reflux disease)    Hemorrhoid    History of hidradenitis suppurativa    Hyperlipemia    IBS (irritable bowel syndrome)    Kidney infection    as a child   Low back pain 05/25/2016   Moderate obstructive sleep apnea 04/15/2023   Osteopenia    PCO (polycystic ovaries)    Pneumonia    Pruritus 08/22/2017    Past Surgical History:  Procedure Laterality Date   ANKLE SURGERY Left    plate and 8 screws in left ankle   CARPAL TUNNEL RELEASE Left 11/05/2022   Procedure: Carpel Tunnel Release - left;  Surgeon: Tia Alert, MD;  Location: [facility] OR;  Service: Neurosurgery;  Laterality: Left;   EYE SURGERY     Lasik   HEMORRHOID SURGERY     WISDOM TOOTH EXTRACTION      Family History  Problem Relation Age of Onset   Allergic rhinitis Mother    Eczema Mother    Asthma Mother    Allergies Mother    COPD Mother    Irritable bowel syndrome Mother    Allergic rhinitis Father    Heart disease Father        mitral valve disease/rupture during physical stress   GER disease Father    Irritable bowel syndrome Father    Heart failure Father    Allergic rhinitis Sister    Allergies Sister    Eczema Sister    Cancer Sister 28       breast, DCIS   Allergic rhinitis Sister    Allergic rhinitis Brother    Hepatitis C Brother    Cancer Brother 17       ALL   Cancer Maternal Grandmother        ovarian cancer   Heart disease Maternal Grandfather        MI at 78   Kidney disease Paternal Grandmother    Kidney disease Paternal Grandfather        possible kidney cancer   Heart disease Daughter        asd s/p repair at age 89   Anxiety disorder Daughter    Allergies Daughter    Irritable bowel syndrome Niece    Ovarian cancer Other         Grandmother   Arthritis Other    Colon cancer Neg Hx    Rectal cancer Neg Hx    Esophageal cancer Neg Hx    Stomach cancer Neg Hx     Social History   Socioeconomic History   Marital status: Married  Spouse name: Not on file   Number of children: 3   Years of education: Not on file   Highest education level: Bachelor's degree (e.g., BA, AB, BS)  Occupational History   Occupation: Teacher, adult education: [employer]  Tobacco Use   Smoking status: Never    Passive exposure: Past   Smokeless tobacco: Never  Vaping Use   Vaping Use: Never used  Substance and Sexual Activity   Alcohol use: No   Drug use: No   Sexual activity: Yes    Partners: Male    Birth control/protection: Post-menopausal  Other Topics Concern   Not on file  Social History Narrative   Works in interventional radiology-RN [employer]   Married   3 daughters   Social Determinants of Health   Financial Resource Strain: Low Risk  (05/22/2023)   Overall Financial Resource Strain (CARDIA)    Difficulty of Paying Living Expenses: Not hard at all  Food Insecurity: No Food Insecurity (05/22/2023)   Hunger Vital Sign    Worried About Running Out of Food in the Last Year: Never true    Ran Out of Food in the Last Year: Never true  Transportation Needs: No Transportation Needs (05/22/2023)   PRAPARE - Administrator, Civil Service (Medical): No    Lack of Transportation (Non-Medical): No  Physical Activity: Unknown (05/22/2023)   Exercise Vital Sign    Days of Exercise per Week: 4 days    Minutes of Exercise per Session: Patient declined  Stress: No Stress Concern Present (05/22/2023)   [employer] of Occupational Health - Occupational Stress Questionnaire    Feeling of Stress : Only a little  Social Connections: Socially Integrated (05/22/2023)   Social Connection and Isolation Panel [NHANES]    Frequency of Communication with Friends and Family: More than three times a week    Frequency of Social  Gatherings with Friends and Family: Twice a week    Attends Religious Services: More than 4 times per year    Active Member of [employer] or Organizations: Yes    Attends Engineer, structural: More than 4 times per year    Marital Status: Married  Catering manager Violence: Not on file    Review of Systems  Constitutional:  Positive for fatigue.  Respiratory:  Positive for apnea.   Psychiatric/Behavioral:  Positive for sleep disturbance.     Vitals:   07/01/23 0850  BP: 118/82  Pulse: 65  SpO2: 95%     Physical Exam HENT:     Head: Normocephalic.     Mouth/Throat:     Mouth: Mucous membranes are moist.  Cardiovascular:     Rate and Rhythm: Normal rate and regular rhythm.     Heart sounds: No murmur heard.    No friction rub.  Pulmonary:     Effort: No respiratory distress.     Breath sounds: No stridor. No wheezing or rhonchi.  Musculoskeletal:     Cervical back: No rigidity or tenderness.  Neurological:     Mental Status: She is alert.  Psychiatric:        Mood and Affect: Mood normal.     Data Reviewed: Sleep study was reviewed  Previous office visits prior Micheline Maze reviewed  Assessment:  Moderate obstructive sleep apnea  Recent COVID infection -Symptoms have resolved  Underlying history of asthma  Excessive daytime sleepiness -This is related to untreated sleep disordered breathing  Plan/Recommendations:  She will be picking up her  device sometime in August  He does take a little bit of time to get used to using an oral device on a regular basis  Expectation is that symptoms will improve with oral device use  Encouraged to give [place] a call if she starts using the device and not able to tolerate it or not noticing improvement in symptoms  Other options of treatment he may need to be considered  May require a sleep study with the oral device in place if symptoms do not improve with use  Tentative follow-up in about 6  months  Encouraged to continue working on weight loss and increasing activities  I spent 30 minutes dedicated to the care of this patient on the date of this encounter to include previsit review of records, face-to-face time with the patient discussing conditions above, post visit ordering of testing, clinical documentation with electronic health record and communicated necessary findings to members of the patient's care team  Virl Diamond MD [facility] 07/01/2023, 9:10 AM  CC: [name], MD

## 2023-07-01 NOTE — Patient Instructions (Signed)
Obstructive sleep apnea -Once you pick up the oral device  -it does take a little bit of time to get used to using it  -Expectation is that symptoms should start feeling better a few weeks once you start using it regularly  I will see you back in about 6 months  Call with significant concerns  Continue weight loss efforts -Regular exercise and diet

## 2023-07-08 ENCOUNTER — Other Ambulatory Visit (HOSPITAL_COMMUNITY): Payer: Self-pay

## 2023-07-09 ENCOUNTER — Ambulatory Visit (INDEPENDENT_AMBULATORY_CARE_PROVIDER_SITE_OTHER): Payer: 59

## 2023-07-09 ENCOUNTER — Other Ambulatory Visit: Payer: Self-pay | Admitting: Oncology

## 2023-07-09 DIAGNOSIS — J309 Allergic rhinitis, unspecified: Secondary | ICD-10-CM | POA: Diagnosis not present

## 2023-07-09 DIAGNOSIS — Z006 Encounter for examination for normal comparison and control in clinical research program: Secondary | ICD-10-CM

## 2023-07-10 IMAGING — US US PELVIS COMPLETE WITH TRANSVAGINAL
1 series · 14 of 25 positions shown · non-contrast
Comparison: None

CLINICAL DATA: Postmenopausal bleeding, pelvic pain, acute cystitis
without hematuria

EXAM:
TRANSABDOMINAL AND TRANSVAGINAL ULTRASOUND OF PELVIS
TECHNIQUE: Both transabdominal and transvaginal ultrasound examinations of the
pelvis were performed. Transabdominal technique was performed for
global imaging of the pelvis including uterus, ovaries, adnexal
regions, and pelvic cul-de-sac. It was necessary to proceed with
endovaginal exam following the transabdominal exam to visualize the
endometrium and ovaries.

[Series 1: us pelvis complete with transvaginal · 14 of 79 slices shown]
[im 1/79]
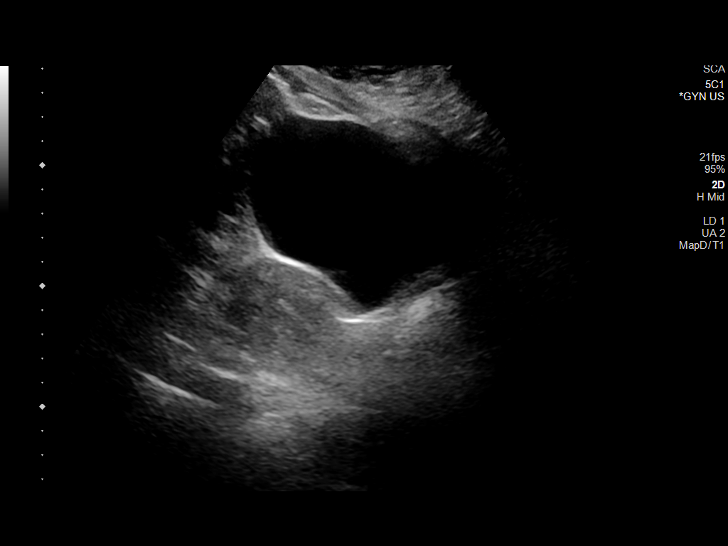
[im 7/79]
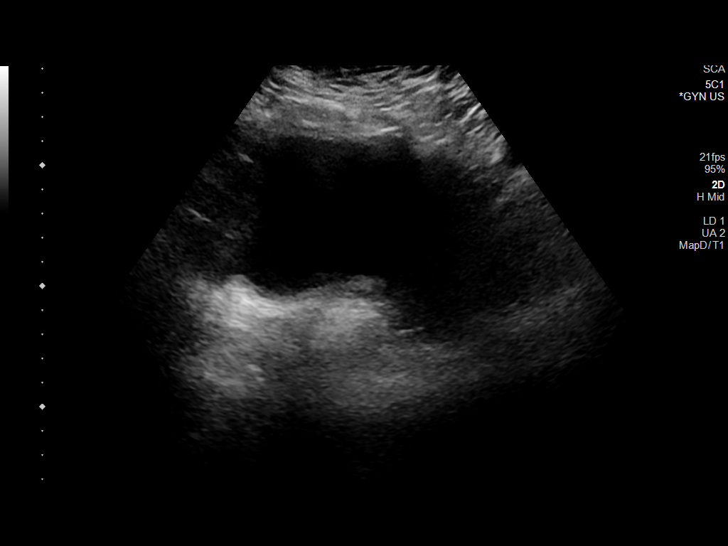
[im 14/79]
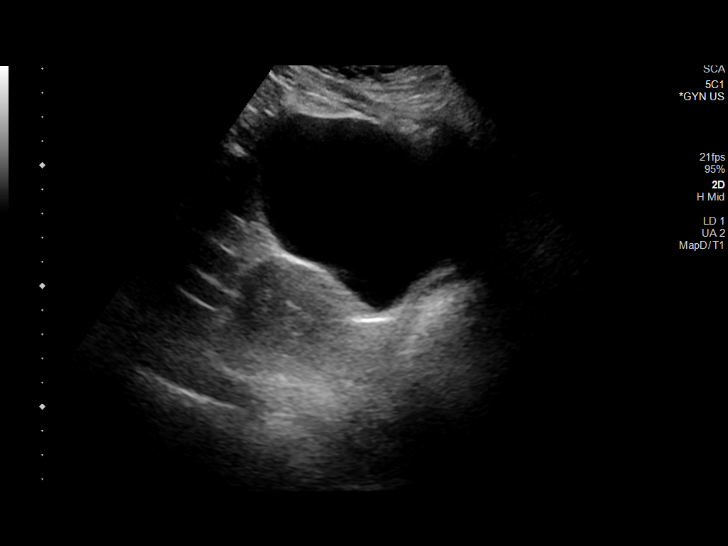
[im 20/79]
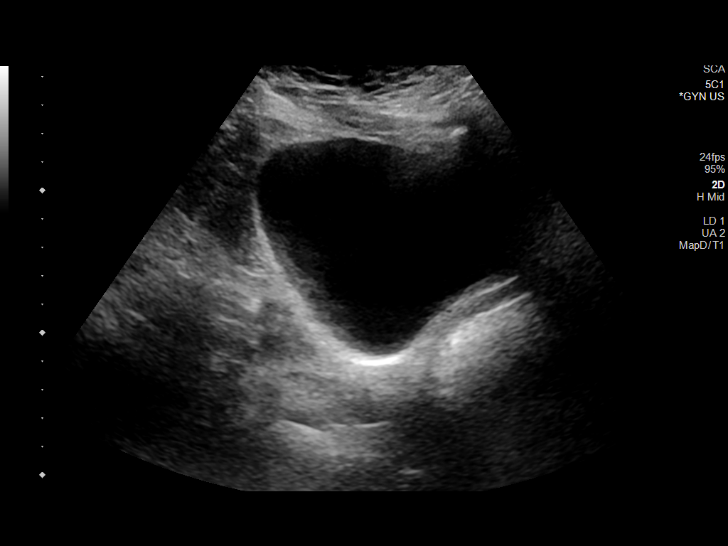
[im 27/79]
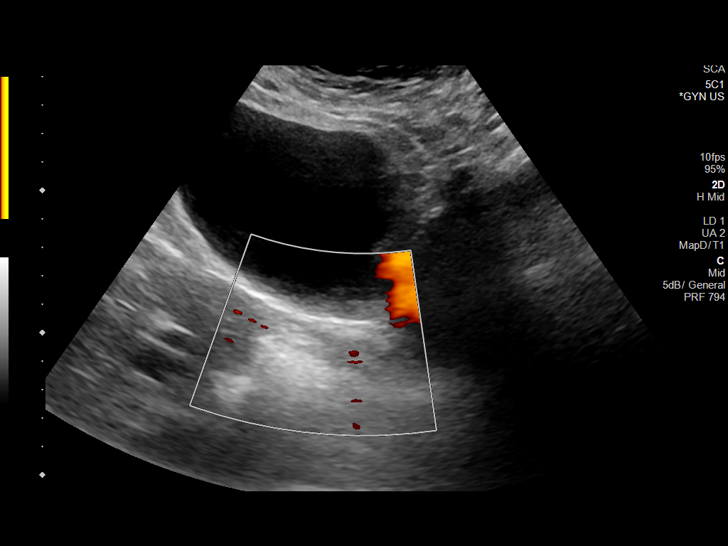
[im 30/79]
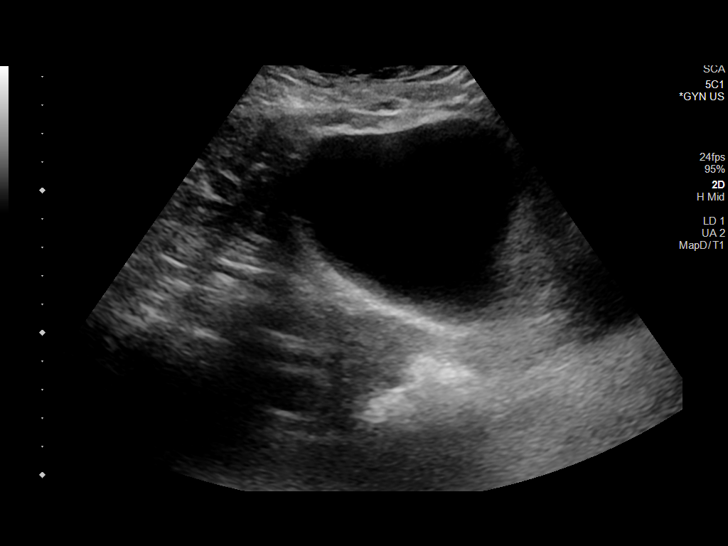
[im 36/79]
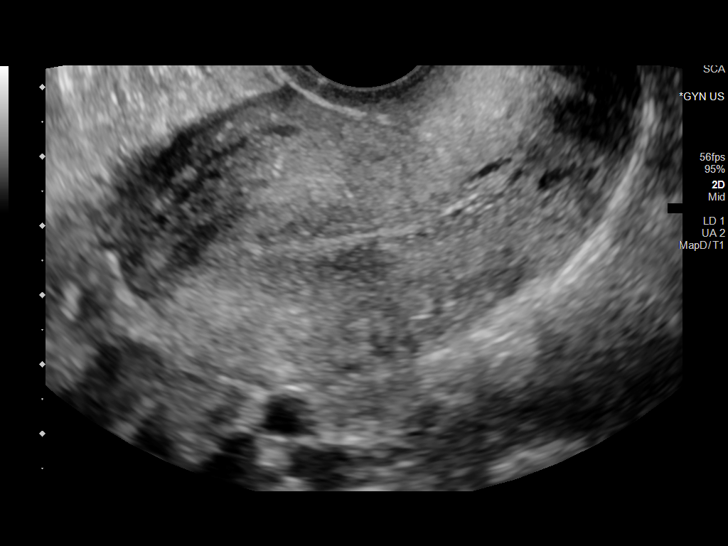
[im 43/79]
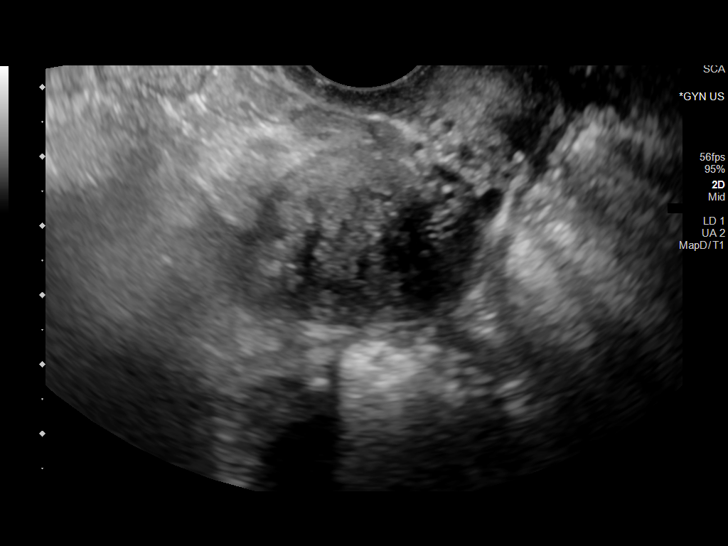
[im 49/79]
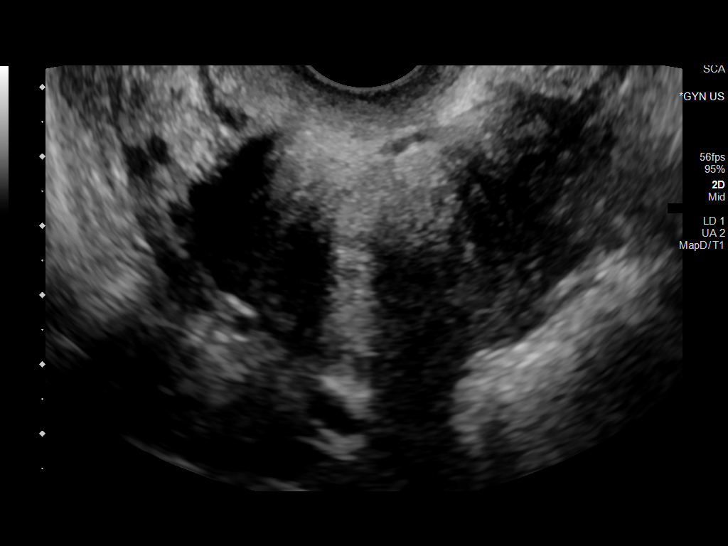
[im 53/79]
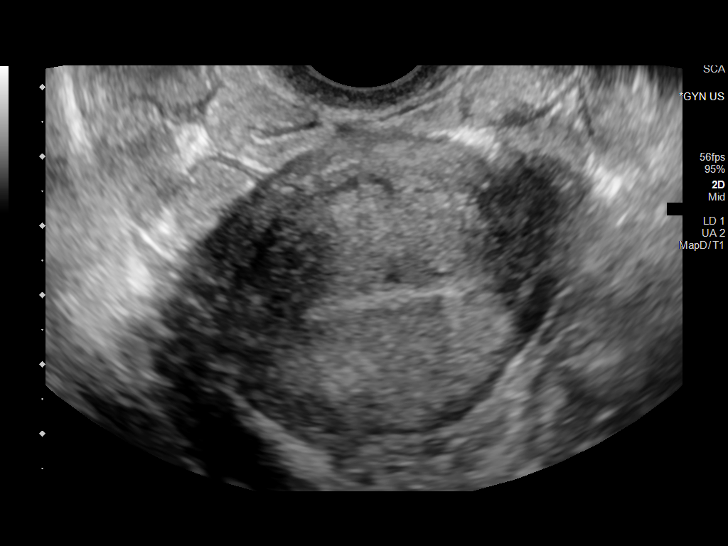
[im 59/79]
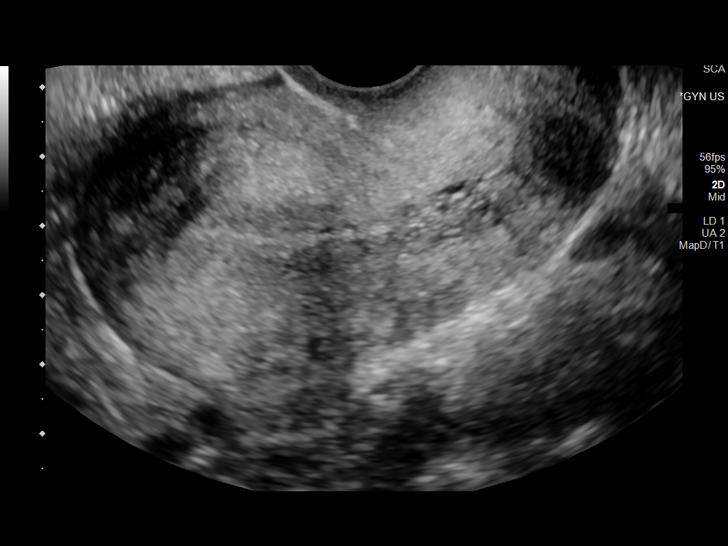
[im 66/79]
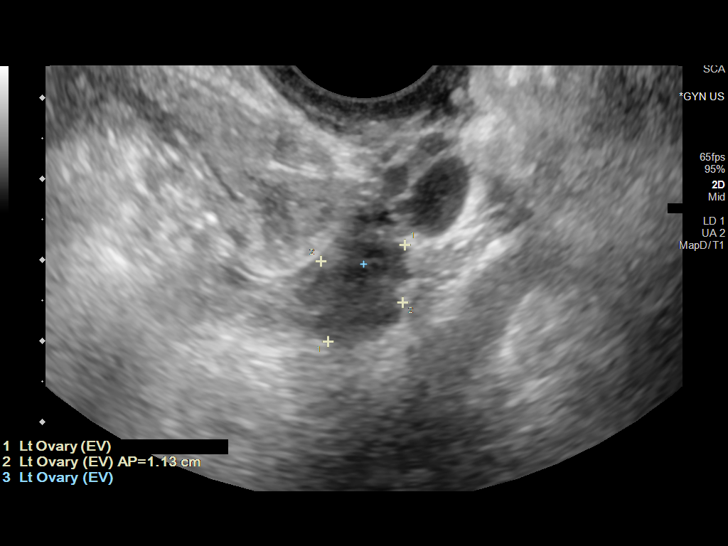
[im 72/79]
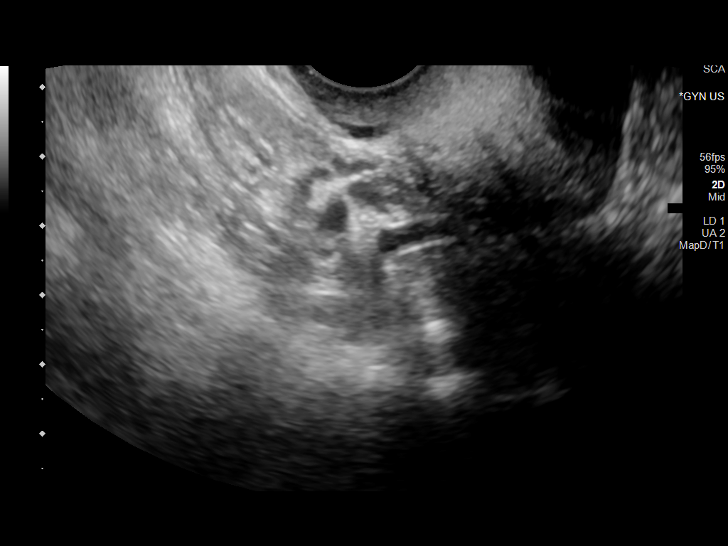
[im 79/79]
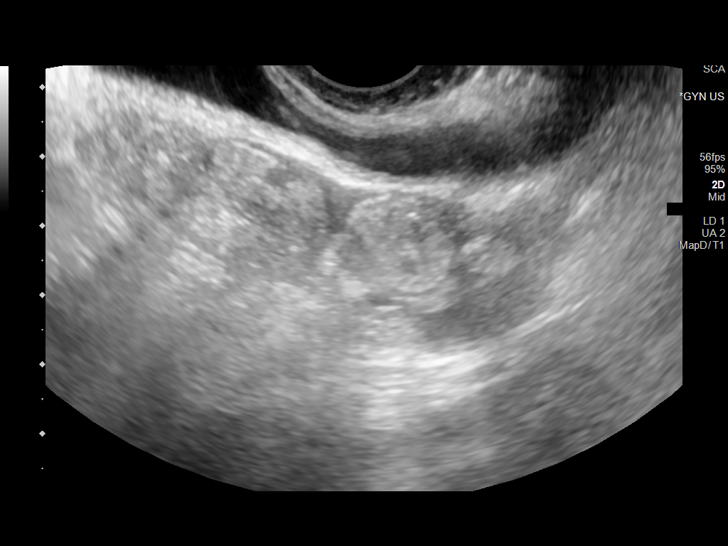

[14 of 25 positions shown; findings below may reference images not displayed]

FINDINGS: Uterus

Measurements: 7.7 x 4.3 x 4.8 cm = volume: 8.4 mL. Normal morphology
without mass

Endometrium

Thickness: 1 mm.  No endometrial fluid or KLEVER

Right ovary

Measurements: 1.7 x 1.2 x 1.1 cm = volume: 1.2 mL. Normal morphology
without mass

Left ovary

Measurements: 1.5 x 1.1 x 1.5 cm = volume: 1.4 mL. Normal morphology
without mass

Other findings

No free pelvic fluid.  No adnexal masses.
IMPRESSION: Normal exam.

## 2023-07-16 ENCOUNTER — Other Ambulatory Visit (HOSPITAL_COMMUNITY): Payer: Self-pay

## 2023-07-16 ENCOUNTER — Ambulatory Visit (INDEPENDENT_AMBULATORY_CARE_PROVIDER_SITE_OTHER): Payer: 59 | Admitting: *Deleted

## 2023-07-16 DIAGNOSIS — J309 Allergic rhinitis, unspecified: Secondary | ICD-10-CM | POA: Diagnosis not present

## 2023-07-25 ENCOUNTER — Ambulatory Visit (INDEPENDENT_AMBULATORY_CARE_PROVIDER_SITE_OTHER): Payer: 59

## 2023-07-25 DIAGNOSIS — J309 Allergic rhinitis, unspecified: Secondary | ICD-10-CM

## 2023-08-06 ENCOUNTER — Ambulatory Visit (INDEPENDENT_AMBULATORY_CARE_PROVIDER_SITE_OTHER): Payer: 59 | Admitting: *Deleted

## 2023-08-06 DIAGNOSIS — J309 Allergic rhinitis, unspecified: Secondary | ICD-10-CM | POA: Diagnosis not present

## 2023-08-08 ENCOUNTER — Other Ambulatory Visit (HOSPITAL_COMMUNITY): Payer: Self-pay

## 2023-08-16 DIAGNOSIS — G4733 Obstructive sleep apnea (adult) (pediatric): Secondary | ICD-10-CM | POA: Diagnosis not present

## 2023-08-19 ENCOUNTER — Ambulatory Visit (INDEPENDENT_AMBULATORY_CARE_PROVIDER_SITE_OTHER): Payer: 59 | Admitting: *Deleted

## 2023-08-19 ENCOUNTER — Other Ambulatory Visit (HOSPITAL_COMMUNITY): Payer: Self-pay

## 2023-08-19 DIAGNOSIS — J309 Allergic rhinitis, unspecified: Secondary | ICD-10-CM

## 2023-08-27 ENCOUNTER — Ambulatory Visit (INDEPENDENT_AMBULATORY_CARE_PROVIDER_SITE_OTHER): Payer: 59 | Admitting: *Deleted

## 2023-08-27 DIAGNOSIS — J309 Allergic rhinitis, unspecified: Secondary | ICD-10-CM

## 2023-08-27 IMAGING — DX DG CERVICAL SPINE COMPLETE 4+V
6 series · 6 of 6 positions shown · non-contrast
Comparison: None Available.

CLINICAL DATA: Neck pain.  Paresthesias.

EXAM:
CERVICAL SPINE - COMPLETE 4+ VIEW

[c-spine lat]
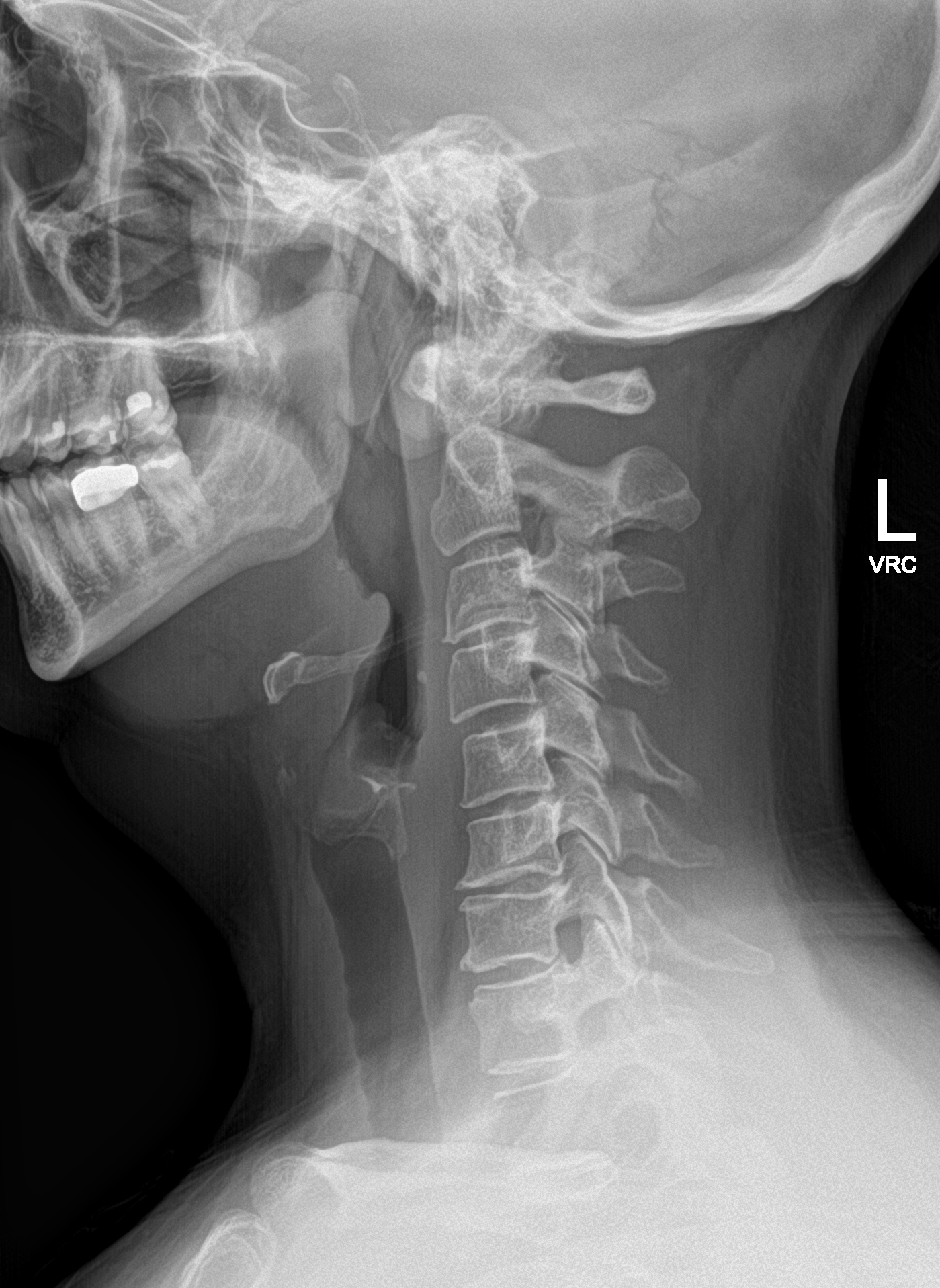

[c-spine obl (1 of 2)]
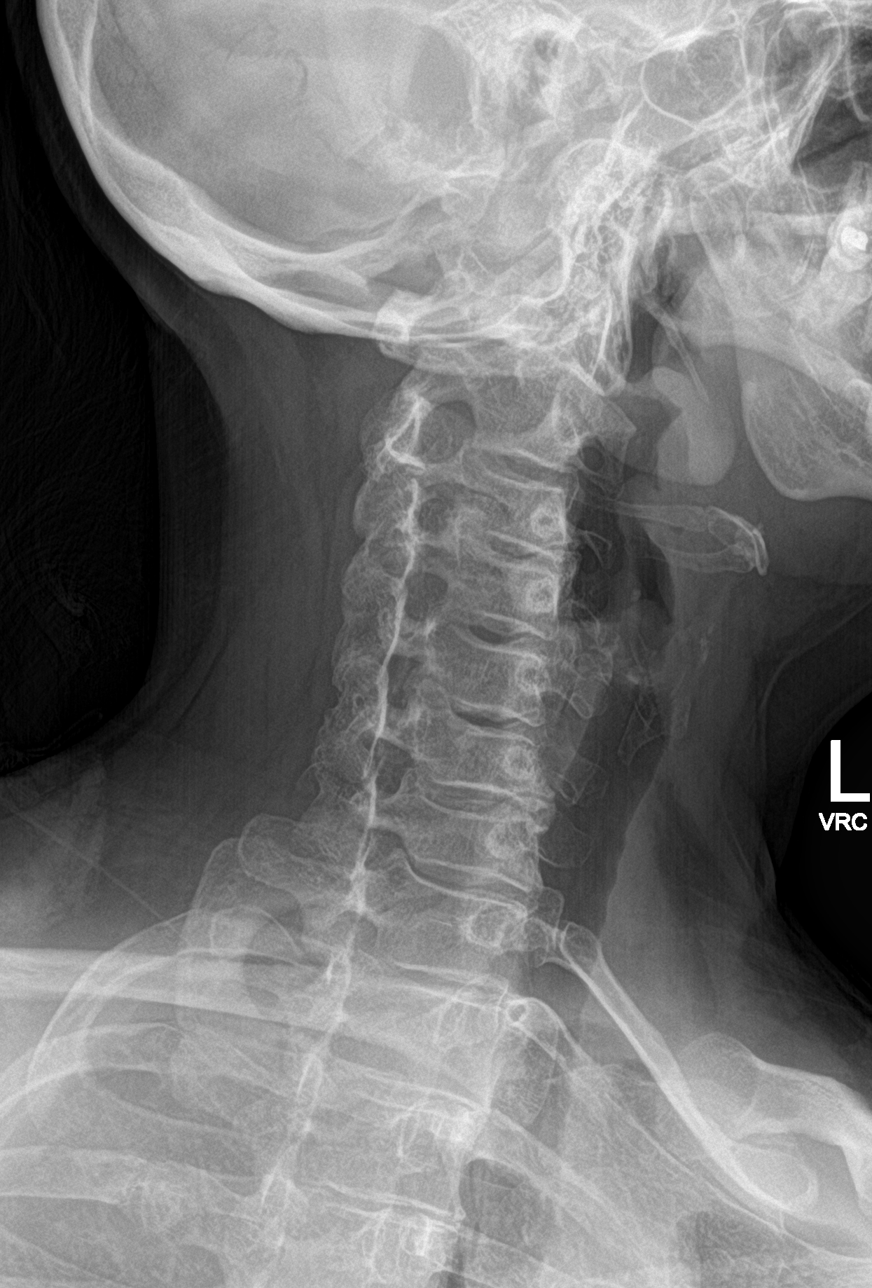

[c-spine obl (2 of 2)]
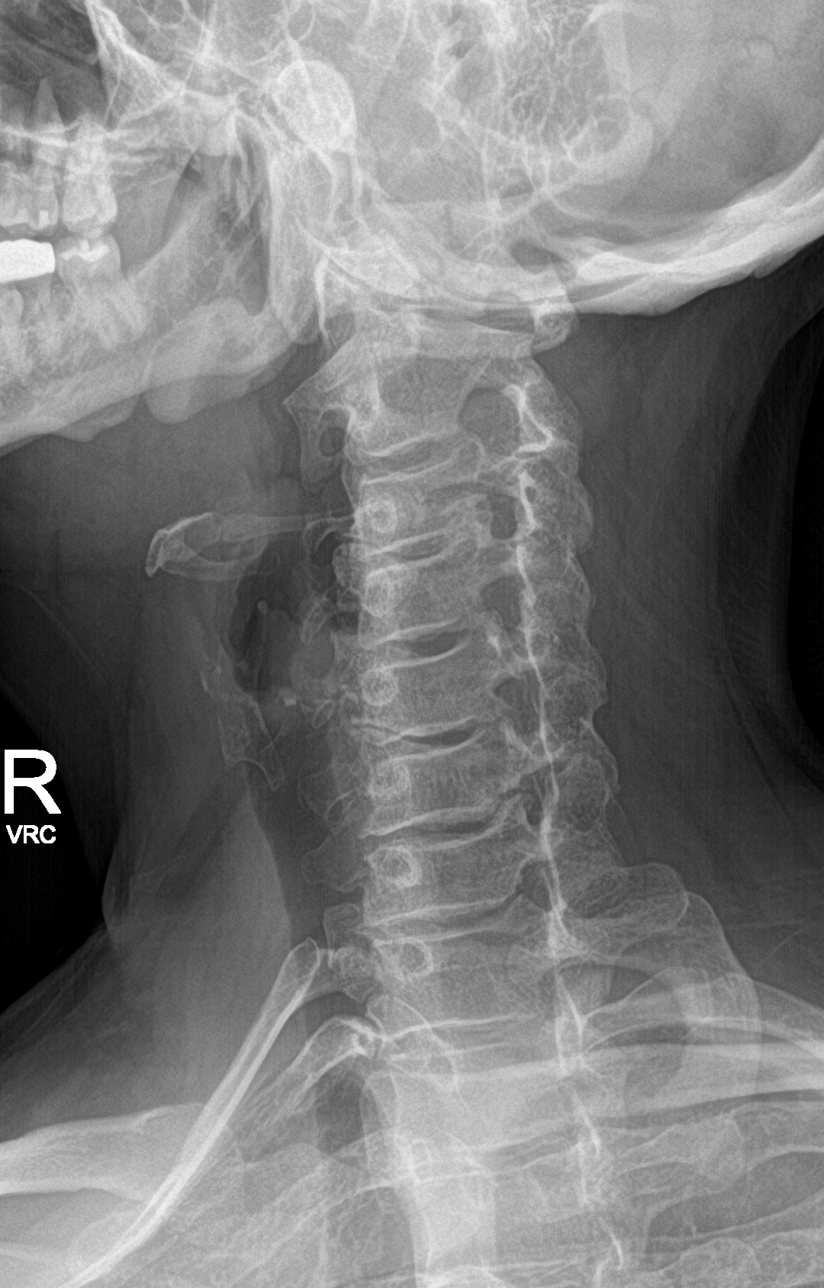

[c-spine ap]
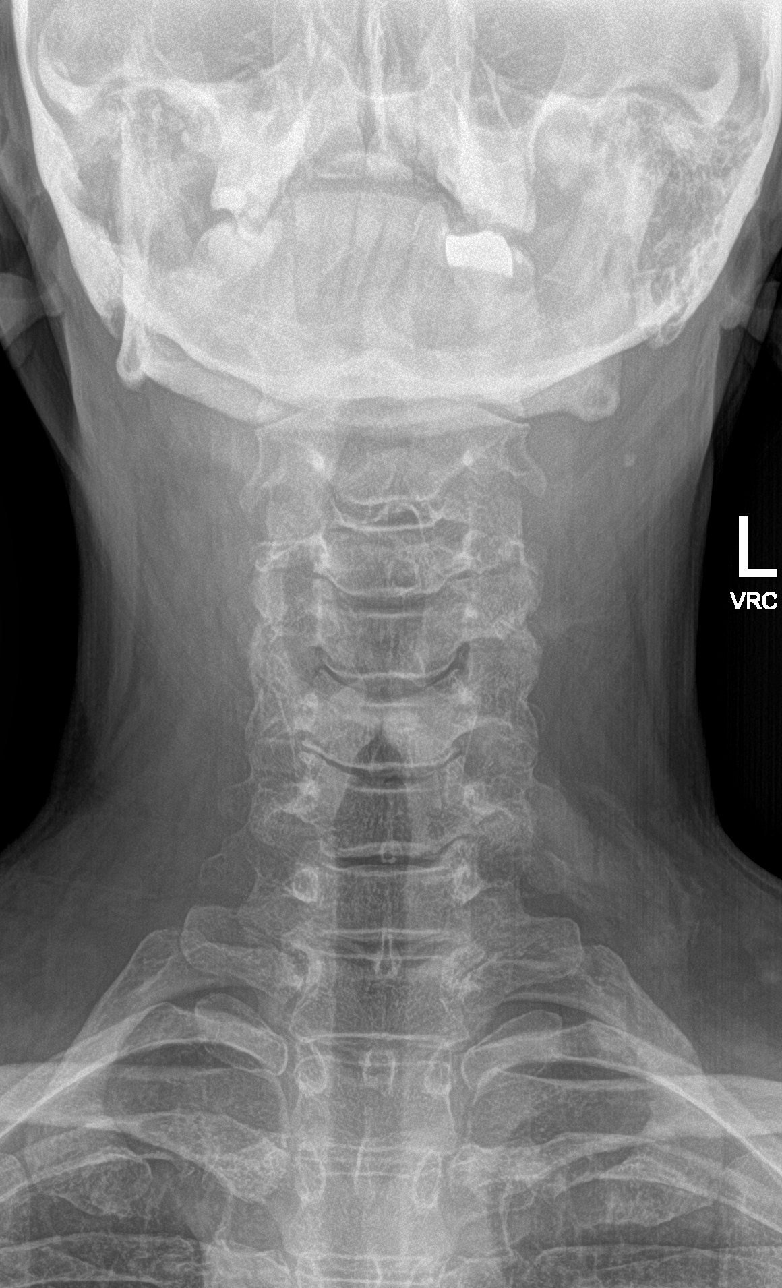

[c-spine open mouth]
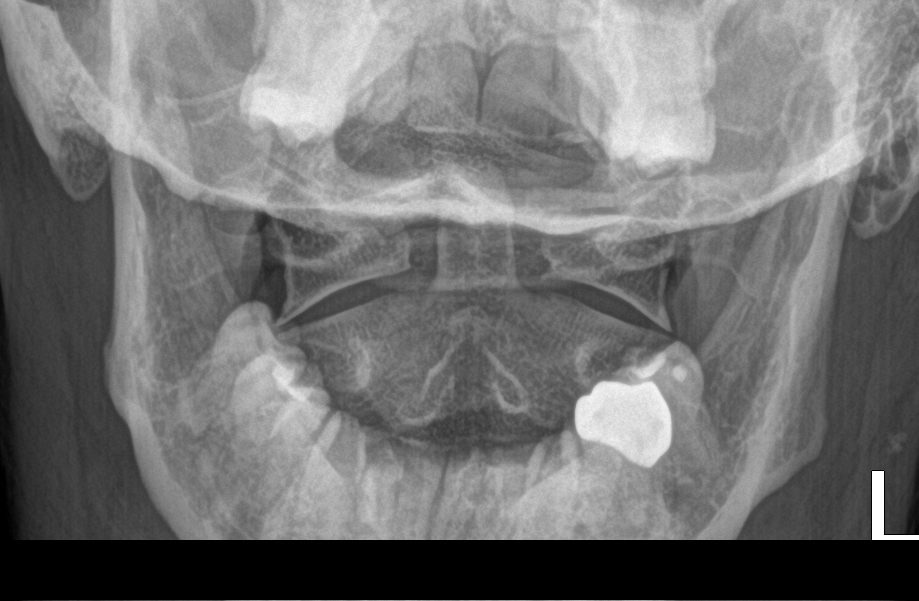

[[person_name]]
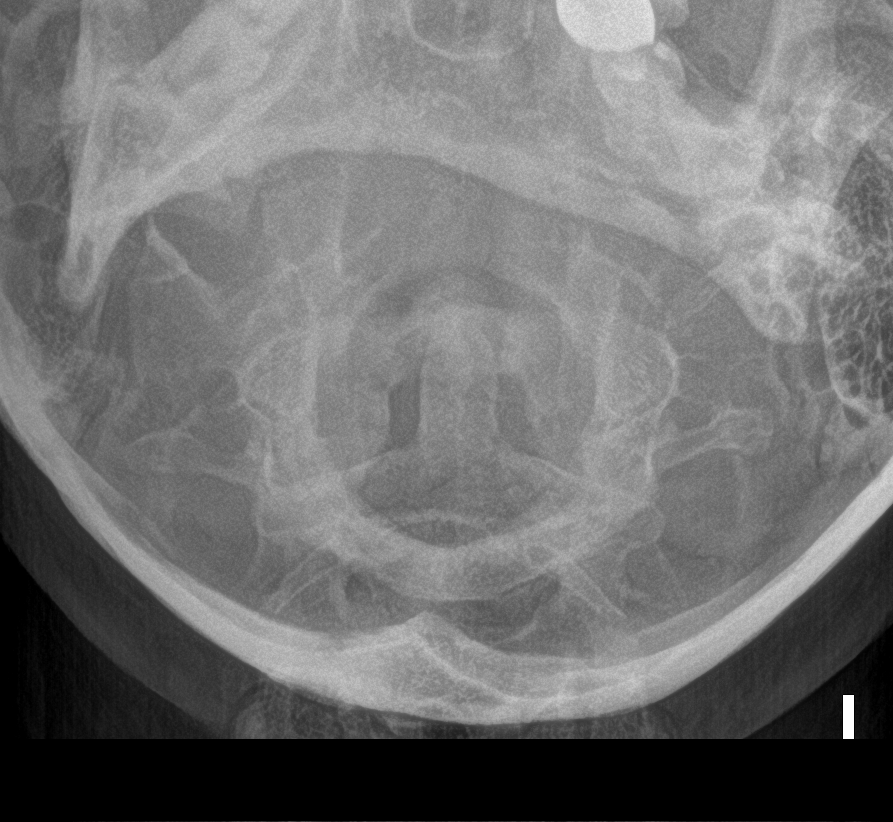

[6 of 6 positions shown; findings below may reference images not displayed]

FINDINGS: There is straightening of normal cervical lordosis. There is no
evidence of cervical spine fracture or prevertebral soft tissue
swelling. Alignment is normal. There is mild disc space narrowing
and endplate osteophyte formation at C3-C4, C6-C7 and C7-T1
compatible with mild degenerative change. Neural foramina appear
patent bilaterally. C1-C2 interval within normal limits on the
open-mouth view.
IMPRESSION: 1. No evidence for fracture or malalignment.
2. Mild degenerative changes.

## 2023-09-03 ENCOUNTER — Ambulatory Visit (INDEPENDENT_AMBULATORY_CARE_PROVIDER_SITE_OTHER): Payer: Self-pay

## 2023-09-03 DIAGNOSIS — J309 Allergic rhinitis, unspecified: Secondary | ICD-10-CM | POA: Diagnosis not present

## 2023-09-08 NOTE — Progress Notes (Unsigned)
Follow Up Note  RE: Tricia Potts MRN: [id number] DOB: 1971/04/01 Date of Office Visit: 09/09/2023  Referring provider: [name], MD Primary care provider: [name], MD  Chief Complaint: No chief complaint on file.  History of Present Illness: I had the pleasure of seeing Tricia Potts for a follow up visit at the [facility] on 09/08/2023. She is a 52 y.o. female, who is being followed for allergic rhinoconjunctivitis on AIT, asthma, atopic dermatitis. Her previous allergy office visit was on 03/18/2023 with Dr. Selena Batten. Today is a regular follow up visit.  Seasonal and perennial allergic rhinoconjunctivitis Past history - Perennial rhinoconjunctivitis symptoms for 40+ years which flares in the spring and fall.  Was on AIT at age 74 and age 54s for less than 1 year.  Had some pruritus after the last injection so she stopped.  3 cats and 2 dogs at home.  2023 skin testing showed: Positive to grass, weed, ragweed, trees, cat, dog, mold. Interim history - Started AIT on 01/30/2023 (G-RW-[id number]-T and M-C-D) with no issues. Continue environmental control measures as below. Use over the counter antihistamines such as Zyrtec (cetirizine), Claritin (loratadine), Allegra (fexofenadine), or Xyzal (levocetirizine) daily as needed. May take twice a day during allergy flares. May switch antihistamines every few months. May take Singulair (montelukast) 10mg  daily at night as needed.  Continue Ryaltris (olopatadine + mometasone nasal spray combination) 1-2 sprays per nostril twice a day.  Nasal saline spray (i.e., Simply Saline) or nasal saline lavage (i.e., NeilMed) is recommended as needed and prior to medicated nasal sprays. May use over the counter eye drops as needed. Continue allergy injections - given today. Keep follow up with ENT.    Asthma Past history - Usually flares with URIs. No recent prednisone. Interim history - no issues.  Today's spirometry was normal.  Daily  controller medication(s): none.  May use Airsupra rescue inhaler 2 puffs every 4 to 6 hours as needed for shortness of breath, chest tightness, coughing, and wheezing. Do not use more than 12 puffs in 24 hours. May use Airsupra rescue inhaler 2 puffs 5 to 15 minutes prior to strenuous physical activities. Monitor frequency of use. Rinse mouth after each use. If not covered then use albuterol as before.    Other atopic dermatitis Rash on face. No issues with foods. Follows with dermatology. Continue proper skin care. Continue recommendations as per your dermatologist.  Patch testing. Dupixent.  Assessment and Plan: Tricia Potts is a 52 y.o. female with: ***  No follow-ups on file.  No orders of the defined types were placed in this encounter.  Lab Orders  No laboratory test(s) ordered today    Diagnostics: Spirometry:  Tracings reviewed. Her effort: {Blank single:19197::"Good reproducible efforts.","It was hard to get consistent efforts and there is a question as to whether this reflects a maximal maneuver.","Poor effort, data can not be interpreted."} FVC: ***L FEV1: ***L, ***% predicted FEV1/FVC ratio: ***% Interpretation: {Blank single:19197::"Spirometry consistent with mild obstructive disease","Spirometry consistent with moderate obstructive disease","Spirometry consistent with severe obstructive disease","Spirometry consistent with possible restrictive disease","Spirometry consistent with mixed obstructive and restrictive disease","Spirometry uninterpretable due to technique","Spirometry consistent with normal pattern","No overt abnormalities noted given today's efforts"}.  Please see scanned spirometry results for details.  Skin Testing: {Blank single:19197::"Select foods","Environmental allergy panel","Environmental allergy panel and select foods","Food allergy panel","None","Deferred due to recent antihistamines use"}. *** Results discussed with patient/family.   Medication  List:  Current Outpatient Medications  Medication Sig Dispense Refill  acetaminophen (TYLENOL) 500 MG tablet Take 1,000 mg by mouth in the morning and at bedtime.     albuterol (VENTOLIN HFA) 108 (90 Base) MCG/ACT inhaler Inhale 2 puffs into the lungs every 6 (six) hours as needed for wheezing or shortness of breath. 18 g 2   Albuterol-Budesonide (AIRSUPRA) 90-80 MCG/ACT AERO Inhale 2 puffs into the lungs every 4 (four) hours as needed (coughing, wheezing, chest tightness). Do not exceed 12 puffs in 24 hours. 10.7 g 2   ALPRAZolam (XANAX) 0.25 MG tablet Take 1 tablet by mouth 2 (two) times daily as needed for anxiety. 40 tablet 1   atorvastatin (LIPITOR) 20 MG tablet Take 1 tablet (20 mg total) by mouth at bedtime. 90 tablet 1   augmented betamethasone dipropionate (DIPROLENE-AF) 0.05 % cream Apply to affected areas 2 times a day as needed for rash/flares 50 g 1   Brimonidine Tartrate (LUMIFY) 0.025 % SOLN Place 1 drop into both eyes 2 (two) times daily as needed (eye irritation.).     CALCIUM CITRATE PO Take 600 mg by mouth in the morning.     Cholecalciferol (VITAMIN D) 50 MCG (2000 UT) CAPS Take 1 capsule (2,000 Units total) by mouth in the morning. 30 capsule 5   clindamycin (CLEOCIN T) 1 % external solution Apply a small amount to skin 2 - 3 times a week for acne (Patient taking differently: Apply 1 Application topically daily as needed (acne).) 60 mL 5   Crisaborole 2 % OINT APPLY TO FINGERTIPS 2 TIMES DAILY. 100 g 3   Dermatological Products, Misc. [facility]) lotion Apply 1 Application topically in the morning and at bedtime.     EPINEPHrine 0.3 mg/0.3 mL IJ SOAJ injection Inject 0.3 mg into the muscle as needed for anaphylaxis. 2 each 1   famotidine (PEPCID) 40 MG tablet Take 1 tablet (40 mg total) by mouth at bedtime. 90 tablet 1   hydroxypropyl methylcellulose / hypromellose (ISOPTO TEARS / GONIOVISC) 2.5 % ophthalmic solution Place 1 drop into both eyes 3 (three) times daily as needed  for dry eyes.     ibuprofen (ADVIL,MOTRIN) 200 MG tablet Take 400-800 mg by mouth every 6 (six) hours as needed for headache or mild pain.     ketoconazole (NIZORAL) 2 % cream Apply to the face twice a day for flaking and scaling 60 g 1   montelukast (SINGULAIR) 10 MG tablet Take 1 tablet (10 mg total) by mouth at bedtime as needed. 90 tablet 3   Multiple Vitamin (MULTI-VITAMIN DAILY PO) Take 1 tablet by mouth in the morning.     naphazoline-pheniramine (ALLERGY EYE) 0.025-0.3 % ophthalmic solution Place 1 drop into both eyes 3 (three) times daily as needed for eye irritation or allergies.     Olopatadine-Mometasone (RYALTRIS) [id number] MCG/ACT SUSP Place 1-2 sprays into the nose in the morning and at bedtime. 29 g 5   spironolactone (ALDACTONE) 50 MG tablet Take one tablet by mouth once daily 30 tablet 3   venlafaxine XR (EFFEXOR-XR) 75 MG 24 hr capsule Take 1 capsule (75 mg total) by mouth daily with breakfast. 90 capsule 1   No current facility-administered medications for this visit.   Allergies: No Known Allergies I reviewed her past medical history, social history, family history, and environmental history and no significant changes have been reported from her previous visit.  Review of Systems  Constitutional:  Negative for appetite change, chills, fever and unexpected weight change.  HENT:  Negative for congestion, postnasal drip, rhinorrhea and  sneezing.   Eyes:  Negative for itching.  Respiratory:  Negative for cough, chest tightness, shortness of breath and wheezing.   Cardiovascular:  Negative for chest pain.  Gastrointestinal:  Negative for abdominal pain.  Genitourinary:  Negative for difficulty urinating.  Skin:  Positive for rash.  Allergic/Immunologic: Positive for environmental allergies.  Neurological:  Negative for headaches.    Objective: LMP  (LMP Unknown)  There is no height or weight on file to calculate BMI. Physical Exam Vitals and nursing note reviewed.   Constitutional:      Appearance: Normal appearance. She is well-developed.  HENT:     Head: Normocephalic and atraumatic.     Right Ear: Tympanic membrane and external ear normal.     Left Ear: Tympanic membrane and external ear normal.     Nose: Nose normal.     Mouth/Throat:     Mouth: Mucous membranes are moist.     Pharynx: Oropharynx is clear.  Eyes:     Conjunctiva/sclera: Conjunctivae normal.  Cardiovascular:     Rate and Rhythm: Normal rate and regular rhythm.     Heart sounds: Normal heart sounds. No murmur heard.    No friction rub. No gallop.  Pulmonary:     Effort: Pulmonary effort is normal.     Breath sounds: Normal breath sounds. No wheezing, rhonchi or rales.  Musculoskeletal:     Cervical back: Neck supple.  Skin:    General: Skin is warm.     Findings: Rash present.     Comments: Erythematous hue on the face and upper anterior chest.   Neurological:     Mental Status: She is alert and oriented to person, place, and time.  Psychiatric:        Behavior: Behavior normal.    Previous notes and tests were reviewed. The plan was reviewed with the patient/family, and all questions/concerned were addressed.  It was my pleasure to see Tricia Potts today and participate in her care. Please feel free to contact me with any questions or concerns.  Sincerely,  Wyline Mood, DO Allergy & Immunology  [facility] office: [phone] [facility] office: [phone]

## 2023-09-09 ENCOUNTER — Encounter: Payer: Self-pay | Admitting: Allergy

## 2023-09-09 ENCOUNTER — Other Ambulatory Visit: Payer: Self-pay

## 2023-09-09 ENCOUNTER — Ambulatory Visit: Payer: Self-pay

## 2023-09-09 ENCOUNTER — Ambulatory Visit: Payer: 59 | Admitting: Allergy

## 2023-09-09 VITALS — BP 110/80 | HR 70 | Temp 98.1°F | Resp 16 | Ht 63.25 in | Wt 170.0 lb

## 2023-09-09 DIAGNOSIS — J301 Allergic rhinitis due to pollen: Secondary | ICD-10-CM | POA: Diagnosis not present

## 2023-09-09 DIAGNOSIS — J4520 Mild intermittent asthma, uncomplicated: Secondary | ICD-10-CM

## 2023-09-09 DIAGNOSIS — J3081 Allergic rhinitis due to animal (cat) (dog) hair and dander: Secondary | ICD-10-CM | POA: Diagnosis not present

## 2023-09-09 DIAGNOSIS — L2089 Other atopic dermatitis: Secondary | ICD-10-CM | POA: Diagnosis not present

## 2023-09-09 DIAGNOSIS — H1013 Acute atopic conjunctivitis, bilateral: Secondary | ICD-10-CM

## 2023-09-09 DIAGNOSIS — J3089 Other allergic rhinitis: Secondary | ICD-10-CM | POA: Diagnosis not present

## 2023-09-09 DIAGNOSIS — J309 Allergic rhinitis, unspecified: Secondary | ICD-10-CM

## 2023-09-09 NOTE — Patient Instructions (Addendum)
Environmental allergies 2023 skin testing showed: Positive to grass, weed, ragweed, trees, cat, dog, mold. Continue environmental control measures. Use over the counter antihistamines such as Zyrtec (cetirizine), Claritin (loratadine), Allegra (fexofenadine), or Xyzal (levocetirizine) daily as needed. May take twice a day during allergy flares. May switch antihistamines every few months. May take twice a day the of injections and the day after injections.  May take Singulair (montelukast) 10mg  daily at night as needed.  Continue Ryaltris (olopatadine + mometasone nasal spray combination) 1-2 sprays per nostril twice a day.  Nasal saline spray (i.e., Simply Saline) or nasal saline lavage (i.e., NeilMed) is recommended as needed and prior to medicated nasal sprays. May use over the counter eye drops as needed. Continue allergy injections - given today.   Asthma Normal breathing test.  Daily controller medication(s): none.  May use Airsupra rescue inhaler 2 puffs every 4 to 6 hours as needed for shortness of breath, chest tightness, coughing, and wheezing. Do not use more than 12 puffs in 24 hours. May use Airsupra rescue inhaler 2 puffs 5 to 15 minutes prior to strenuous physical activities. Rinse mouth after each use.  Monitor frequency of use - if you need to use it more than twice per week on a consistent basis let us know.  Breathing control goals:  Full participation in all desired activities (may need albuterol before activity) Albuterol use two times or less a week on average (not counting use with activity) Cough interfering with sleep two times or less a month Oral steroids no more than once a year No hospitalizations   Eczema Continue proper skin care. Continue recommendations as per your dermatologist.   Follow up in 6 months or sooner if needed.

## 2023-09-14 ENCOUNTER — Encounter (HOSPITAL_COMMUNITY): Payer: Self-pay

## 2023-09-14 ENCOUNTER — Other Ambulatory Visit: Payer: Self-pay

## 2023-09-18 ENCOUNTER — Ambulatory Visit (INDEPENDENT_AMBULATORY_CARE_PROVIDER_SITE_OTHER): Payer: Self-pay

## 2023-09-18 DIAGNOSIS — J309 Allergic rhinitis, unspecified: Secondary | ICD-10-CM

## 2023-09-22 ENCOUNTER — Encounter: Payer: Self-pay | Admitting: Family Medicine

## 2023-09-25 NOTE — Assessment & Plan Note (Signed)
Encourage heart healthy diet such as MIND or DASH diet, increase exercise, avoid trans fats, simple carbohydrates and processed foods, consider a krill or fish or flaxseed oil cap daily.  °

## 2023-09-25 NOTE — Assessment & Plan Note (Signed)
Doing well on current dose of venlafaxine no changes

## 2023-09-25 NOTE — Assessment & Plan Note (Signed)
Patient encouraged to maintain heart healthy diet, regular exercise, adequate sleep. Consider daily probiotics. Take medications as prescribed. Labs ordered and reviewed. Last colonoscopy 05/2020 repeat in 2024 Pap due 2024 or 2025.

## 2023-09-25 NOTE — Assessment & Plan Note (Signed)
Supplement and monitor 

## 2023-09-25 NOTE — Assessment & Plan Note (Signed)
hgba1c acceptable, minimize simple carbs. Increase exercise as tolerated.  

## 2023-09-25 NOTE — Assessment & Plan Note (Signed)
Encouraged to get adequate exercise, calcium and vitamin d intake 

## 2023-09-26 ENCOUNTER — Ambulatory Visit (INDEPENDENT_AMBULATORY_CARE_PROVIDER_SITE_OTHER): Payer: 59

## 2023-09-26 ENCOUNTER — Ambulatory Visit (INDEPENDENT_AMBULATORY_CARE_PROVIDER_SITE_OTHER): Payer: 59 | Admitting: Family Medicine

## 2023-09-26 ENCOUNTER — Other Ambulatory Visit (HOSPITAL_BASED_OUTPATIENT_CLINIC_OR_DEPARTMENT_OTHER): Payer: Self-pay

## 2023-09-26 VITALS — BP 124/82 | HR 73 | Temp 98.0°F | Resp 16 | Ht 63.0 in | Wt 171.8 lb

## 2023-09-26 DIAGNOSIS — J309 Allergic rhinitis, unspecified: Secondary | ICD-10-CM

## 2023-09-26 DIAGNOSIS — E2839 Other primary ovarian failure: Secondary | ICD-10-CM | POA: Diagnosis not present

## 2023-09-26 DIAGNOSIS — F419 Anxiety disorder, unspecified: Secondary | ICD-10-CM

## 2023-09-26 DIAGNOSIS — K589 Irritable bowel syndrome without diarrhea: Secondary | ICD-10-CM | POA: Diagnosis not present

## 2023-09-26 DIAGNOSIS — Z1231 Encounter for screening mammogram for malignant neoplasm of breast: Secondary | ICD-10-CM

## 2023-09-26 DIAGNOSIS — Z0000 Encounter for general adult medical examination without abnormal findings: Secondary | ICD-10-CM | POA: Diagnosis not present

## 2023-09-26 DIAGNOSIS — E782 Mixed hyperlipidemia: Secondary | ICD-10-CM

## 2023-09-26 DIAGNOSIS — M8580 Other specified disorders of bone density and structure, unspecified site: Secondary | ICD-10-CM | POA: Diagnosis not present

## 2023-09-26 DIAGNOSIS — F32A Depression, unspecified: Secondary | ICD-10-CM | POA: Diagnosis not present

## 2023-09-26 DIAGNOSIS — R739 Hyperglycemia, unspecified: Secondary | ICD-10-CM

## 2023-09-26 DIAGNOSIS — L578 Other skin changes due to chronic exposure to nonionizing radiation: Secondary | ICD-10-CM

## 2023-09-26 DIAGNOSIS — E559 Vitamin D deficiency, unspecified: Secondary | ICD-10-CM

## 2023-09-26 DIAGNOSIS — Z780 Asymptomatic menopausal state: Secondary | ICD-10-CM

## 2023-09-26 MED ORDER — ALPRAZOLAM 0.25 MG PO TABS
0.2500 mg | ORAL_TABLET | Freq: Two times a day (BID) | ORAL | 0 refills | Status: AC | PRN
  Filled 2023-09-26: qty 10, 5d supply, fill #0

## 2023-09-26 NOTE — Patient Instructions (Addendum)
Live to [address] [place]  60-80 ounces ideally 10 ounces 1-2 hours  Preventive Care 54-54 Years Old, Female Preventive care refers to lifestyle choices and visits with your health care provider that can promote health and wellness. Preventive care visits are also called wellness exams. What can I expect for my preventive care visit? Counseling Your health care provider may ask you questions about your: Medical history, including: Past medical problems. Family medical history. Pregnancy history. Current health, including: Menstrual cycle. Method of birth control. Emotional well-being. Home life and relationship well-being. Sexual activity and sexual health. Lifestyle, including: Alcohol, nicotine or tobacco, and drug use. Access to firearms. Diet, exercise, and sleep habits. Work and work Astronomer. Sunscreen use. Safety issues such as seatbelt and bike helmet use. Physical exam Your health care provider will check your: Height and weight. These may be used to calculate your BMI (body mass index). BMI is a measurement that tells if you are at a healthy weight. Waist circumference. This measures the distance around your waistline. This measurement also tells if you are at a healthy weight and may help predict your risk of certain diseases, such as type 2 diabetes and high blood pressure. Heart rate and blood pressure. Body temperature. Skin for abnormal spots. What immunizations do I need?  Vaccines are usually given at various ages, according to a schedule. Your health care provider will recommend vaccines for you based on your age, medical history, and lifestyle or other factors, such as travel or where you work. What tests do I need? Screening Your health care provider may recommend screening tests for certain conditions. This may include: Lipid and cholesterol levels. Diabetes screening. This is done by checking your blood sugar (glucose) after you have not eaten for a  while (fasting). Pelvic exam and Pap test. Hepatitis B test. Hepatitis C test. HIV (human immunodeficiency virus) test. STI (sexually transmitted infection) testing, if you are at risk. Lung cancer screening. Colorectal cancer screening. Mammogram. Talk with your health care provider about when you should start having regular mammograms. This may depend on whether you have a family history of breast cancer. BRCA-related cancer screening. This may be done if you have a family history of breast, ovarian, tubal, or peritoneal cancers. Bone density scan. This is done to screen for osteoporosis. Talk with your health care provider about your test results, treatment options, and if necessary, the need for more tests. Follow these instructions at home: Eating and drinking  Eat a diet that includes fresh fruits and vegetables, whole grains, lean protein, and low-fat dairy products. Take vitamin and mineral supplements as recommended by your health care provider. Do not drink alcohol if: Your health care provider tells you not to drink. You are pregnant, may be pregnant, or are planning to become pregnant. If you drink alcohol: Limit how much you have to 0-1 drink a day. Know how much alcohol is in your drink. In the [place] one drink equals one 12 oz bottle of beer (355 mL), one 5 oz glass of wine (148 mL), or one 1 oz glass of hard liquor (44 mL). Lifestyle Brush your teeth every morning and night with fluoride toothpaste. Floss one time each day. Exercise for at least 30 minutes 5 or more days each week. Do not use any products that contain nicotine or tobacco. These products include cigarettes, chewing tobacco, and vaping devices, such as e-cigarettes. If you need help quitting, ask your health care provider. Do not use drugs. If you  are sexually active, practice safe sex. Use a condom or other form of protection to prevent STIs. If you do not wish to become pregnant, use a form of birth  control. If you plan to become pregnant, see your health care provider for a prepregnancy visit. Take aspirin only as told by your health care provider. Make sure that you understand how much to take and what form to take. Work with your health care provider to find out whether it is safe and beneficial for you to take aspirin daily. Find healthy ways to manage stress, such as: Meditation, yoga, or listening to music. Journaling. Talking to a trusted person. Spending time with friends and family. Minimize exposure to UV radiation to reduce your risk of skin cancer. Safety Always wear your seat belt while driving or riding in a vehicle. Do not drive: If you have been drinking alcohol. Do not ride with someone who has been drinking. When you are tired or distracted. While texting. If you have been using any mind-altering substances or drugs. Wear a helmet and other protective equipment during sports activities. If you have firearms in your house, make sure you follow all gun safety procedures. Seek help if you have been physically or sexually abused. What's next? Visit your health care provider once a year for an annual wellness visit. Ask your health care provider how often you should have your eyes and teeth checked. Stay up to date on all vaccines. This information is not intended to replace advice given to you by your health care provider. Make sure you discuss any questions you have with your health care provider. Document Revised: 06/07/2021 Document Reviewed: 06/07/2021 Elsevier Patient Education  2024 [employer].

## 2023-09-27 LAB — CBC WITH DIFFERENTIAL/PLATELET
Basophils Absolute: 0.1 10*3/uL (ref 0.0–0.1)
Basophils Relative: 1 % (ref 0.0–3.0)
Eosinophils Absolute: 0.2 10*3/uL (ref 0.0–0.7)
Eosinophils Relative: 2.8 % (ref 0.0–5.0)
HCT: 41.9 % (ref 36.0–46.0)
Hemoglobin: 14.1 g/dL (ref 12.0–15.0)
Lymphocytes Relative: 33.9 % (ref 12.0–46.0)
Lymphs Abs: 2.1 10*3/uL (ref 0.7–4.0)
MCHC: 33.6 g/dL (ref 30.0–36.0)
MCV: 92.5 fL (ref 78.0–100.0)
Monocytes Absolute: 0.4 10*3/uL (ref 0.1–1.0)
Monocytes Relative: 6.2 % (ref 3.0–12.0)
Neutro Abs: 3.5 10*3/uL (ref 1.4–7.7)
Neutrophils Relative %: 56.1 % (ref 43.0–77.0)
Platelets: 328 10*3/uL (ref 150.0–400.0)
RBC: 4.53 Mil/uL (ref 3.87–5.11)
RDW: 12.9 % (ref 11.5–15.5)
WBC: 6.3 10*3/uL (ref 4.0–10.5)

## 2023-09-27 LAB — COMPREHENSIVE METABOLIC PANEL
ALT: 23 U/L (ref 0–35)
AST: 22 U/L (ref 0–37)
Albumin: 4.6 g/dL (ref 3.5–5.2)
Alkaline Phosphatase: 70 U/L (ref 39–117)
BUN: 12 mg/dL (ref 6–23)
CO2: 26 meq/L (ref 19–32)
Calcium: 9.7 mg/dL (ref 8.4–10.5)
Chloride: 104 meq/L (ref 96–112)
Creatinine, Ser: 0.66 mg/dL (ref 0.40–1.20)
GFR: 99.34 mL/min (ref >60.00)
Glucose, Bld: 89 mg/dL (ref 70–99)
Potassium: 4.2 meq/L (ref 3.5–5.1)
Sodium: 138 meq/L (ref 135–145)
Total Bilirubin: 0.4 mg/dL (ref 0.2–1.2)
Total Protein: 7.4 g/dL (ref 6.0–8.3)

## 2023-09-27 LAB — LIPID PANEL
Cholesterol: 178 mg/dL (ref 0–200)
HDL: 61.5 mg/dL (ref >39.00)
LDL Cholesterol: 79 mg/dL (ref 0–99)
NonHDL: 116.54
Total CHOL/HDL Ratio: 3
Triglycerides: 190 mg/dL — ABNORMAL HIGH (ref 0.0–149.0)
VLDL: 38 mg/dL (ref 0.0–40.0)

## 2023-09-27 LAB — HEMOGLOBIN A1C: Hgb A1c MFr Bld: 5.8 % (ref 4.6–6.5)

## 2023-09-27 LAB — TSH: TSH: 3.13 u[IU]/mL (ref 0.35–5.50)

## 2023-09-29 ENCOUNTER — Encounter: Payer: Self-pay | Admitting: Family Medicine

## 2023-09-29 DIAGNOSIS — L578 Other skin changes due to chronic exposure to nonionizing radiation: Secondary | ICD-10-CM | POA: Insufficient documentation

## 2023-09-29 NOTE — Assessment & Plan Note (Signed)
Referred to dermatology for further evaluation.

## 2023-09-29 NOTE — Progress Notes (Signed)
Subjective:    Patient ID: Tricia Potts, female    DOB: Feb 22, 1971, 52 y.o.   MRN: [id number]  Chief Complaint  Patient presents with   Annual Exam    Annual Exam     HPI Discussed the use of AI scribe software for clinical note transcription with the patient, who gave verbal consent to proceed.  History of Present Illness   The patient, with a past medical history of osteopenia, presents for a routine check-up. The patient reports experiencing gastrointestinal symptoms, including constipation and diarrhea, after starting a new probiotic supplement called Seed a week ago. The patient notes that these symptoms are unusual as their irritable bowel syndrome (IBS) has been under good control. The patient also mentions having skin issues and is considering switching to a dermatologist within the [facility] system. The patient also expresses anxiety related to dental procedures due to a traumatic experience in childhood. The patient is currently taking allergy shots and reports significant improvement in localized reactions after the syringes were rinsed with epinephrine. The patient is also on Xanax, which is used sparingly, primarily for dental procedures.        Past Medical History:  Diagnosis Date   Allergy    Anemia 06/18/2014   Anxiety    Anxiety and depression 02/17/2009   Qualifier: Diagnosis of  By: Andrey Campanile MD, LauraLee     Asthma    mild - rarely uses inhaler   Broken ankle 2011   (left) roller skating   Bronchitis, mucopurulent recurrent (HCC) 04/21/2013   Cervical cancer screening 02/20/2016   Depression    Eczema    GERD (gastroesophageal reflux disease)    Hemorrhoid    History of hidradenitis suppurativa    Hyperlipemia    IBS (irritable bowel syndrome)    Kidney infection    as a child   Low back pain 05/25/2016   Moderate obstructive sleep apnea 04/15/2023   Osteopenia    PCO (polycystic ovaries)    Pneumonia    Pruritus 08/22/2017    Past Surgical  History:  Procedure Laterality Date   ANKLE SURGERY Left    plate and 8 screws in left ankle   CARPAL TUNNEL RELEASE Left 11/05/2022   Procedure: Carpel Tunnel Release - left;  Surgeon: Tia Alert, MD;  Location: [facility] OR;  Service: Neurosurgery;  Laterality: Left;   EYE SURGERY     Lasik   HEMORRHOID SURGERY     WISDOM TOOTH EXTRACTION      Family History  Problem Relation Age of Onset   Allergic rhinitis Mother    Eczema Mother    Asthma Mother    Allergies Mother    COPD Mother    Irritable bowel syndrome Mother    Allergic rhinitis Father    Heart disease Father        mitral valve disease/rupture during physical stress   GER disease Father    Irritable bowel syndrome Father    Heart failure Father    Allergic rhinitis Sister    Allergies Sister    Eczema Sister    Cancer Sister 70       breast, DCIS   Allergic rhinitis Sister    Allergic rhinitis Brother    Hepatitis C Brother    Cancer Brother 17       ALL   Cancer Maternal Grandmother        ovarian cancer   Heart disease Maternal Grandfather  MI at 70   Kidney disease Paternal Grandmother    Kidney disease Paternal Grandfather        possible kidney cancer   Heart disease Daughter        asd s/p repair at age 80   Anxiety disorder Daughter    Allergies Daughter    Irritable bowel syndrome Niece    Ovarian cancer Other        Grandmother   Arthritis Other    Colon cancer Neg Hx    Rectal cancer Neg Hx    Esophageal cancer Neg Hx    Stomach cancer Neg Hx     Social History   Socioeconomic History   Marital status: Married    Spouse name: Not on file   Number of children: 3   Years of education: Not on file   Highest education level: Bachelor's degree (e.g., BA, AB, BS)  Occupational History   Occupation: Teacher, adult education: [employer]  Tobacco Use   Smoking status: Never    Passive exposure: Past   Smokeless tobacco: Never  Vaping Use   Vaping status: Never Used  Substance and Sexual  Activity   Alcohol use: No   Drug use: No   Sexual activity: Yes    Partners: Male    Birth control/protection: Post-menopausal  Other Topics Concern   Not on file  Social History Narrative   Works in interventional radiology-RN [employer]   Married   3 daughters   Social Determinants of Health   Financial Resource Strain: Low Risk  (05/22/2023)   Overall Financial Resource Strain (CARDIA)    Difficulty of Paying Living Expenses: Not hard at all  Food Insecurity: No Food Insecurity (05/22/2023)   Hunger Vital Sign    Worried About Running Out of Food in the Last Year: Never true    Ran Out of Food in the Last Year: Never true  Transportation Needs: No Transportation Needs (05/22/2023)   PRAPARE - Administrator, Civil Service (Medical): No    Lack of Transportation (Non-Medical): No  Physical Activity: Unknown (05/22/2023)   Exercise Vital Sign    Days of Exercise per Week: 4 days    Minutes of Exercise per Session: Patient declined  Stress: No Stress Concern Present (05/22/2023)   [employer] of Occupational Health - Occupational Stress Questionnaire    Feeling of Stress : Only a little  Social Connections: Socially Integrated (05/22/2023)   Social Connection and Isolation Panel [NHANES]    Frequency of Communication with Friends and Family: More than three times a week    Frequency of Social Gatherings with Friends and Family: Twice a week    Attends Religious Services: More than 4 times per year    Active Member of [employer] or Organizations: Yes    Attends Engineer, structural: More than 4 times per year    Marital Status: Married  Catering manager Violence: Not on file    Outpatient Medications Prior to Visit  Medication Sig Dispense Refill   albuterol (VENTOLIN HFA) 108 (90 Base) MCG/ACT inhaler Inhale 2 puffs into the lungs every 6 (six) hours as needed for wheezing or shortness of breath. 18 g 2   Albuterol-Budesonide (AIRSUPRA) 90-80 MCG/ACT AERO  Inhale 2 puffs into the lungs every 4 (four) hours as needed (coughing, wheezing, chest tightness). Do not exceed 12 puffs in 24 hours. 10.7 g 2   atorvastatin (LIPITOR) 20 MG tablet Take 1 tablet (20 mg total)  by mouth at bedtime. 90 tablet 1   augmented betamethasone dipropionate (DIPROLENE-AF) 0.05 % cream Apply to affected areas 2 times a day as needed for rash/flares 50 g 1   Brimonidine Tartrate (LUMIFY) 0.025 % SOLN Place 1 drop into both eyes 2 (two) times daily as needed (eye irritation.).     CALCIUM CITRATE PO Take 600 mg by mouth in the morning.     Cholecalciferol (VITAMIN D) 50 MCG (2000 UT) CAPS Take 1 capsule (2,000 Units total) by mouth in the morning. 30 capsule 5   clindamycin (CLEOCIN T) 1 % external solution Apply a small amount to skin 2 - 3 times a week for acne (Patient taking differently: Apply 1 Application topically daily as needed (acne).) 60 mL 5   Crisaborole 2 % OINT APPLY TO FINGERTIPS 2 TIMES DAILY. 100 g 3   Dermatological Products, Misc. [facility]) lotion Apply 1 Application topically in the morning and at bedtime.     EPINEPHrine 0.3 mg/0.3 mL IJ SOAJ injection Inject 0.3 mg into the muscle as needed for anaphylaxis. 2 each 1   hydroxypropyl methylcellulose / hypromellose (ISOPTO TEARS / GONIOVISC) 2.5 % ophthalmic solution Place 1 drop into both eyes 3 (three) times daily as needed for dry eyes.     ibuprofen (ADVIL,MOTRIN) 200 MG tablet Take 400-800 mg by mouth every 6 (six) hours as needed for headache or mild pain.     ketoconazole (NIZORAL) 2 % cream Apply to the face twice a day for flaking and scaling 60 g 1   montelukast (SINGULAIR) 10 MG tablet Take 1 tablet (10 mg total) by mouth at bedtime as needed. 90 tablet 3   Multiple Vitamin (MULTI-VITAMIN DAILY PO) Take 1 tablet by mouth in the morning.     naphazoline-pheniramine (ALLERGY EYE) 0.025-0.3 % ophthalmic solution Place 1 drop into both eyes 3 (three) times daily as needed for eye irritation or  allergies.     Olopatadine-Mometasone (RYALTRIS) [id number] MCG/ACT SUSP Place 1-2 sprays into the nose in the morning and at bedtime. 29 g 5   spironolactone (ALDACTONE) 50 MG tablet Take one tablet by mouth once daily 30 tablet 3   venlafaxine XR (EFFEXOR-XR) 75 MG 24 hr capsule Take 1 capsule (75 mg total) by mouth daily with breakfast. 90 capsule 1   ALPRAZolam (XANAX) 0.25 MG tablet Take 1 tablet by mouth 2 (two) times daily as needed for anxiety. 40 tablet 1   acetaminophen (TYLENOL) 500 MG tablet Take 1,000 mg by mouth in the morning and at bedtime.     No facility-administered medications prior to visit.    No Known Allergies  Review of Systems  Constitutional:  Negative for chills, fever and malaise/fatigue.  HENT:  Positive for congestion. Negative for hearing loss.   Eyes:  Negative for discharge.  Respiratory:  Negative for cough, sputum production and shortness of breath.   Cardiovascular:  Negative for chest pain, palpitations and leg swelling.  Gastrointestinal:  Positive for constipation and diarrhea. Negative for abdominal pain, blood in stool, heartburn, nausea and vomiting.  Genitourinary:  Negative for dysuria, frequency, hematuria and urgency.  Musculoskeletal:  Negative for back pain, falls and myalgias.  Skin:  Negative for rash.  Neurological:  Negative for dizziness, sensory change, loss of consciousness, weakness and headaches.  Endo/Heme/Allergies:  Negative for environmental allergies. Does not bruise/bleed easily.  Psychiatric/Behavioral:  Negative for depression and suicidal ideas. The patient is nervous/anxious. The patient does not have insomnia.  Objective:    Physical Exam Constitutional:      General: She is not in acute distress.    Appearance: Normal appearance. She is not diaphoretic.  HENT:     Head: Normocephalic and atraumatic.     Right Ear: Tympanic membrane, ear canal and external ear normal.     Left Ear: Tympanic membrane, ear canal  and external ear normal.     Nose: Nose normal.     Mouth/Throat:     Mouth: Mucous membranes are moist.     Pharynx: Oropharynx is clear. No oropharyngeal exudate.  Eyes:     General: No scleral icterus.       Right eye: No discharge.        Left eye: No discharge.     Conjunctiva/sclera: Conjunctivae normal.     Pupils: Pupils are equal, round, and reactive to light.  Neck:     Thyroid: No thyromegaly.  Cardiovascular:     Rate and Rhythm: Normal rate and regular rhythm.     Heart sounds: Normal heart sounds. No murmur heard. Pulmonary:     Effort: Pulmonary effort is normal. No respiratory distress.     Breath sounds: Normal breath sounds. No wheezing or rales.  Abdominal:     General: Bowel sounds are normal. There is no distension.     Palpations: Abdomen is soft. There is no mass.     Tenderness: There is no abdominal tenderness.  Musculoskeletal:        General: No tenderness. Normal range of motion.     Cervical back: Normal range of motion and neck supple.  Lymphadenopathy:     Cervical: No cervical adenopathy.  Skin:    General: Skin is warm and dry.     Findings: No rash.  Neurological:     General: No focal deficit present.     Mental Status: She is alert and oriented to person, place, and time.     Cranial Nerves: No cranial nerve deficit.     Coordination: Coordination normal.     Deep Tendon Reflexes: Reflexes are normal and symmetric. Reflexes normal.  Psychiatric:        Mood and Affect: Mood normal.        Behavior: Behavior normal.        Thought Content: Thought content normal.        Judgment: Judgment normal.     BP 124/82 (BP Location: Left Arm, Patient Position: Sitting, Cuff Size: Normal)   Pulse 73   Temp 98 F (36.7 C) (Oral)   Resp 16   Ht 5\' 3"  (1.6 m)   Wt 171 lb 12.8 oz (77.9 kg)   LMP  (LMP Unknown)   SpO2 97%   BMI 30.43 kg/m  Wt Readings from Last 3 Encounters:  09/26/23 171 lb 12.8 oz (77.9 kg)  09/09/23 170 lb (77.1 kg)   07/01/23 164 lb 6.4 oz (74.6 kg)    Diabetic Foot Exam - Simple   No data filed    Lab Results  Component Value Date   WBC 6.3 09/26/2023   HGB 14.1 09/26/2023   HCT 41.9 09/26/2023   PLT 328.0 09/26/2023   GLUCOSE 89 09/26/2023   CHOL 178 09/26/2023   TRIG 190.0 (H) 09/26/2023   HDL 61.50 09/26/2023   LDLDIRECT 94.0 05/23/2023   LDLCALC 79 09/26/2023   ALT 23 09/26/2023   AST 22 09/26/2023   NA 138 09/26/2023   K 4.2 09/26/2023   CL 104  09/26/2023   CREATININE 0.66 09/26/2023   BUN 12 09/26/2023   CO2 26 09/26/2023   TSH 3.13 09/26/2023   HGBA1C 5.8 09/26/2023    Lab Results  Component Value Date   TSH 3.13 09/26/2023   Lab Results  Component Value Date   WBC 6.3 09/26/2023   HGB 14.1 09/26/2023   HCT 41.9 09/26/2023   MCV 92.5 09/26/2023   PLT 328.0 09/26/2023   Lab Results  Component Value Date   NA 138 09/26/2023   K 4.2 09/26/2023   CO2 26 09/26/2023   GLUCOSE 89 09/26/2023   BUN 12 09/26/2023   CREATININE 0.66 09/26/2023   BILITOT 0.4 09/26/2023   ALKPHOS 70 09/26/2023   AST 22 09/26/2023   ALT 23 09/26/2023   PROT 7.4 09/26/2023   ALBUMIN 4.6 09/26/2023   CALCIUM 9.7 09/26/2023   GFR 99.34 09/26/2023   Lab Results  Component Value Date   CHOL 178 09/26/2023   Lab Results  Component Value Date   HDL 61.50 09/26/2023   Lab Results  Component Value Date   LDLCALC 79 09/26/2023   Lab Results  Component Value Date   TRIG 190.0 (H) 09/26/2023   Lab Results  Component Value Date   CHOLHDL 3 09/26/2023   Lab Results  Component Value Date   HGBA1C 5.8 09/26/2023       Assessment & Plan:  Anxiety and depression Assessment & Plan: Doing well on current dose of venlafaxine no changes   Hyperglycemia Assessment & Plan: hgba1c acceptable, minimize simple carbs. Increase exercise as tolerated.  Orders: -     Comprehensive metabolic panel -     Hemoglobin A1c -     TSH  Hyperlipidemia, mixed Assessment & Plan: Encourage  heart healthy diet such as MIND or DASH diet, increase exercise, avoid trans fats, simple carbohydrates and processed foods, consider a krill or fish or flaxseed oil cap daily.   Orders: -     Lipid panel -     TSH  Osteopenia, unspecified location Assessment & Plan: Encouraged to get adequate exercise, calcium and vitamin d intake    Vitamin D deficiency Assessment & Plan: Supplement and monitor    Preventative health care Assessment & Plan: Patient encouraged to maintain heart healthy diet, regular exercise, adequate sleep. Consider daily probiotics. Take medications as prescribed. Labs ordered and reviewed. Last colonoscopy 05/2020 repeat in 2024 Pap due 2024 or 2025.    Sun-damaged skin Assessment & Plan: Referred to dermatology for further evaluation  Orders: -     Ambulatory referral to Dermatology  Irritable bowel syndrome, unspecified type -     CBC with Differential/Platelet  Breast cancer screening by mammogram -     3D Screening Mammogram, Left and Right; Future  Estrogen deficiency -     DG Bone Density; Future  Post-menopausal -     DG Bone Density; Future  Other orders -     ALPRAZolam; Take 1 tablet by mouth 2 (two) times daily as needed for anxiety.  Dispense: 10 tablet; Refill: 0    Assessment and Plan    Annual Physical Routine annual physical examination. Discussed the importance of hydration, protein intake, and maintaining connections with loved ones for overall health. -Continue current lifestyle modifications and health maintenance practices.  Dermatology Referral Patient considering switching to a dermatologist within the [facility] system for streamlined care. -Place referral for dermatology to ensure communication between providers.  Colonoscopy Due for routine colonoscopy in 2024. -If no contact  from gastroenterology by February 2025, patient to call to schedule.  Irritable Bowel Syndrome (IBS) Currently well-controlled. Recently started  Seed DS-01 Symbiotic probiotic. -Continue current management and probiotic regimen.  Pap Smear Last Pap smear in 2021 was normal. Discussed frequency of Pap smears. -Schedule Pap smear between 2024 and 2026 based on patient preference.  Mammogram Last mammogram in May 2023. -Order mammogram to be scheduled at patient's convenience within the next 6 months.  Bone Density Scan History of osteopenia. Currently taking calcium and vitamin D. -Order DEXA scan to be performed on the same day as the mammogram.  Allergy Shots Currently receiving allergy shots. Recent improvement in localized reactions after implementation of an "epi rinse" before injections. -Continue current allergy shot regimen.  Dental Anxiety History of PTSD related to childhood dental experiences. Previously used Xanax for dental appointments. -Prescribe 10 tablets of Xanax for use as needed for dental appointments.  Follow-up -Schedule 22-month follow-up appointment. -Schedule annual physical examination in 1 year.         Danise Edge, MD

## 2023-10-09 ENCOUNTER — Ambulatory Visit (INDEPENDENT_AMBULATORY_CARE_PROVIDER_SITE_OTHER): Payer: 59 | Admitting: *Deleted

## 2023-10-09 DIAGNOSIS — J309 Allergic rhinitis, unspecified: Secondary | ICD-10-CM | POA: Diagnosis not present

## 2023-10-10 ENCOUNTER — Encounter (HOSPITAL_BASED_OUTPATIENT_CLINIC_OR_DEPARTMENT_OTHER): Payer: Self-pay

## 2023-10-10 ENCOUNTER — Ambulatory Visit (HOSPITAL_BASED_OUTPATIENT_CLINIC_OR_DEPARTMENT_OTHER)
Admission: RE | Admit: 2023-10-10 | Discharge: 2023-10-10 | Disposition: A | Discharge status: Home / Self Care | Payer: 59 | Source: Ambulatory Visit | Attending: Family Medicine | Admitting: Family Medicine

## 2023-10-10 ENCOUNTER — Other Ambulatory Visit (HOSPITAL_BASED_OUTPATIENT_CLINIC_OR_DEPARTMENT_OTHER): Payer: Self-pay

## 2023-10-10 DIAGNOSIS — Z780 Asymptomatic menopausal state: Secondary | ICD-10-CM | POA: Insufficient documentation

## 2023-10-10 DIAGNOSIS — M8588 Other specified disorders of bone density and structure, other site: Secondary | ICD-10-CM | POA: Diagnosis not present

## 2023-10-10 DIAGNOSIS — E2839 Other primary ovarian failure: Secondary | ICD-10-CM | POA: Diagnosis not present

## 2023-10-10 DIAGNOSIS — Z1231 Encounter for screening mammogram for malignant neoplasm of breast: Secondary | ICD-10-CM | POA: Diagnosis not present

## 2023-10-11 ENCOUNTER — Encounter: Payer: Self-pay | Admitting: Allergy

## 2023-10-11 ENCOUNTER — Encounter: Payer: Self-pay | Admitting: Family Medicine

## 2023-10-15 ENCOUNTER — Ambulatory Visit (INDEPENDENT_AMBULATORY_CARE_PROVIDER_SITE_OTHER): Payer: Self-pay | Admitting: *Deleted

## 2023-10-15 DIAGNOSIS — J309 Allergic rhinitis, unspecified: Secondary | ICD-10-CM

## 2023-10-22 ENCOUNTER — Ambulatory Visit (INDEPENDENT_AMBULATORY_CARE_PROVIDER_SITE_OTHER): Payer: Self-pay | Admitting: *Deleted

## 2023-10-22 DIAGNOSIS — J309 Allergic rhinitis, unspecified: Secondary | ICD-10-CM | POA: Diagnosis not present

## 2023-10-28 ENCOUNTER — Other Ambulatory Visit: Payer: Self-pay | Admitting: Family Medicine

## 2023-10-28 ENCOUNTER — Other Ambulatory Visit: Payer: Self-pay

## 2023-10-28 ENCOUNTER — Other Ambulatory Visit (HOSPITAL_COMMUNITY): Payer: Self-pay

## 2023-10-28 MED ORDER — ATORVASTATIN CALCIUM 20 MG PO TABS
20.0000 mg | ORAL_TABLET | Freq: Every evening | ORAL | 1 refills | Status: DC
  Filled 2023-10-28 – 2023-11-06 (×2): qty 90, 90d supply, fill #0
  Filled 2024-01-10 – 2024-01-28 (×3): qty 90, 90d supply, fill #1

## 2023-10-28 MED ORDER — LUMIFY 0.025 % OP SOLN
1.0000 [drp] | Freq: Two times a day (BID) | OPHTHALMIC | 5 refills | Status: AC | PRN
  Filled 2023-10-28: qty 7.5, 20d supply, fill #0
  Filled 2023-11-07: qty 5, 15d supply, fill #0
  Filled 2024-01-28 – 2024-05-22 (×2): qty 5, 15d supply, fill #1

## 2023-10-29 ENCOUNTER — Other Ambulatory Visit (HOSPITAL_COMMUNITY): Payer: Self-pay

## 2023-10-29 ENCOUNTER — Ambulatory Visit (INDEPENDENT_AMBULATORY_CARE_PROVIDER_SITE_OTHER): Payer: 59 | Admitting: *Deleted

## 2023-10-29 DIAGNOSIS — J309 Allergic rhinitis, unspecified: Secondary | ICD-10-CM | POA: Diagnosis not present

## 2023-10-30 ENCOUNTER — Encounter: Payer: Self-pay | Admitting: Family Medicine

## 2023-10-30 ENCOUNTER — Other Ambulatory Visit (HOSPITAL_COMMUNITY): Payer: Self-pay

## 2023-11-01 ENCOUNTER — Other Ambulatory Visit (HOSPITAL_COMMUNITY): Payer: Self-pay | Admitting: Neurological Surgery

## 2023-11-06 ENCOUNTER — Other Ambulatory Visit: Payer: Self-pay

## 2023-11-06 ENCOUNTER — Other Ambulatory Visit (HOSPITAL_COMMUNITY): Payer: Self-pay

## 2023-11-06 ENCOUNTER — Other Ambulatory Visit: Payer: Self-pay | Admitting: Family Medicine

## 2023-11-07 ENCOUNTER — Ambulatory Visit (INDEPENDENT_AMBULATORY_CARE_PROVIDER_SITE_OTHER): Payer: 59

## 2023-11-07 ENCOUNTER — Other Ambulatory Visit (HOSPITAL_COMMUNITY): Payer: Self-pay

## 2023-11-07 DIAGNOSIS — J309 Allergic rhinitis, unspecified: Secondary | ICD-10-CM | POA: Diagnosis not present

## 2023-11-07 MED ORDER — VENLAFAXINE HCL ER 75 MG PO CP24
75.0000 mg | ORAL_CAPSULE | Freq: Every day | ORAL | 1 refills | Status: DC
  Filled 2023-11-07: qty 90, 90d supply, fill #0

## 2023-11-12 ENCOUNTER — Ambulatory Visit (INDEPENDENT_AMBULATORY_CARE_PROVIDER_SITE_OTHER): Payer: Self-pay

## 2023-11-12 DIAGNOSIS — J309 Allergic rhinitis, unspecified: Secondary | ICD-10-CM

## 2023-11-14 ENCOUNTER — Other Ambulatory Visit: Payer: Self-pay

## 2023-11-14 ENCOUNTER — Other Ambulatory Visit (HOSPITAL_COMMUNITY): Payer: Self-pay

## 2023-11-14 DIAGNOSIS — L710 Perioral dermatitis: Secondary | ICD-10-CM | POA: Diagnosis not present

## 2023-11-14 DIAGNOSIS — D225 Melanocytic nevi of trunk: Secondary | ICD-10-CM | POA: Diagnosis not present

## 2023-11-14 DIAGNOSIS — L821 Other seborrheic keratosis: Secondary | ICD-10-CM | POA: Diagnosis not present

## 2023-11-14 DIAGNOSIS — L814 Other melanin hyperpigmentation: Secondary | ICD-10-CM | POA: Diagnosis not present

## 2023-11-14 MED ORDER — HYDROCORTISONE 2.5 % EX OINT
1.0000 | TOPICAL_OINTMENT | Freq: Two times a day (BID) | CUTANEOUS | 2 refills | Status: AC | PRN
  Filled 2023-11-14: qty 28.35, 30d supply, fill #0

## 2023-11-19 ENCOUNTER — Other Ambulatory Visit (HOSPITAL_COMMUNITY): Payer: Self-pay

## 2023-11-19 ENCOUNTER — Other Ambulatory Visit: Payer: Self-pay

## 2023-11-19 ENCOUNTER — Ambulatory Visit (INDEPENDENT_AMBULATORY_CARE_PROVIDER_SITE_OTHER): Payer: 59

## 2023-11-19 DIAGNOSIS — J309 Allergic rhinitis, unspecified: Secondary | ICD-10-CM

## 2023-11-20 ENCOUNTER — Encounter: Payer: Self-pay | Admitting: Allergy

## 2023-11-26 ENCOUNTER — Ambulatory Visit (INDEPENDENT_AMBULATORY_CARE_PROVIDER_SITE_OTHER): Payer: Self-pay | Admitting: *Deleted

## 2023-11-26 DIAGNOSIS — J309 Allergic rhinitis, unspecified: Secondary | ICD-10-CM

## 2023-11-27 ENCOUNTER — Encounter: Payer: Self-pay | Admitting: Allergy

## 2023-11-27 ENCOUNTER — Other Ambulatory Visit (HOSPITAL_COMMUNITY): Payer: Self-pay

## 2023-11-27 ENCOUNTER — Other Ambulatory Visit: Payer: Self-pay

## 2023-11-27 ENCOUNTER — Ambulatory Visit (INDEPENDENT_AMBULATORY_CARE_PROVIDER_SITE_OTHER): Payer: 59 | Admitting: Allergy

## 2023-11-27 VITALS — BP 116/82 | HR 72 | Temp 98.2°F | Resp 14 | Ht 61.0 in | Wt 172.7 lb

## 2023-11-27 DIAGNOSIS — H1013 Acute atopic conjunctivitis, bilateral: Secondary | ICD-10-CM | POA: Diagnosis not present

## 2023-11-27 DIAGNOSIS — J3081 Allergic rhinitis due to animal (cat) (dog) hair and dander: Secondary | ICD-10-CM | POA: Diagnosis not present

## 2023-11-27 DIAGNOSIS — R21 Rash and other nonspecific skin eruption: Secondary | ICD-10-CM

## 2023-11-27 DIAGNOSIS — J4520 Mild intermittent asthma, uncomplicated: Secondary | ICD-10-CM

## 2023-11-27 DIAGNOSIS — J3089 Other allergic rhinitis: Secondary | ICD-10-CM | POA: Diagnosis not present

## 2023-11-27 DIAGNOSIS — J301 Allergic rhinitis due to pollen: Secondary | ICD-10-CM | POA: Diagnosis not present

## 2023-11-27 MED ORDER — HYDROXYZINE HCL 25 MG PO TABS
25.0000 mg | ORAL_TABLET | Freq: Every evening | ORAL | 3 refills | Status: AC | PRN
  Filled 2023-11-27: qty 30, 30d supply, fill #0

## 2023-11-27 MED ORDER — RYALTRIS 665-25 MCG/ACT NA SUSP: 1.0000 | Freq: Two times a day (BID) | NASAL | 5 refills | Status: DC

## 2023-11-27 MED ORDER — METHYLPREDNISOLONE 4 MG PO TBPK
ORAL_TABLET | ORAL | 0 refills | Status: DC
  Filled 2023-11-27: qty 21, 6d supply, fill #0

## 2023-11-27 NOTE — Progress Notes (Signed)
Follow Up Note  RE: Tricia Potts MRN: [id number] DOB: 1971-01-30 Date of Office Visit: 11/27/2023  Referring provider: [name], MD Primary care provider: [name], MD  Chief Complaint: Allergies (Wants to discuss starting up the Dupixent injections again possibly. )  History of Present Illness: I had the pleasure of seeing Tricia Potts for a follow up visit at the [facility] on 11/27/2023. She is a 52 y.o. female, who is being followed for allergic rhinoconjunctivitis on AIT, asthma, atopic dermatitis . Her previous allergy office visit was on 09/09/2023 with Dr. Selena Batten. Today is a new complaint visit of discuss Dupixent .  Discussed the use of AI scribe software for clinical note transcription with the patient, who gave verbal consent to proceed.  The patient presents with persistent skin concerns. She reports that her sinus symptoms are under control, but she is experiencing significant skin issues. She had previously been on Dupixent, which she stopped in the spring due to a suspected facial rash. Since discontinuing the medication, she has noticed an increase in skin lesions, particularly on the face and on the back. These lesions are described as single vesicles that are extremely itchy, particularly on the back.  The patient has been managing these symptoms with 2.5% hydrocortisone as prescribed by her dermatologist. However, she reports that the condition seems to be persisting. She has also undergone patch testing, which revealed sensitivities to [place] of [place], black rubber mix, potassium dichromate, imidazolidinyl urea, and 4-phenylenediamine base. The patient has been attempting to avoid these substances in her skincare products, but she is still experiencing skin issues.  In addition to these skin concerns, the patient also reports occasional flare-ups of her allergies, which she manages with Allegra and a nasal spray. She has not had any significant  upper respiratory infections recently. She also reports that she is postmenopausal and has not had any significant changes in her hormonal status.     Assessment and Plan: Tricia Potts is a 52 y.o. female with: Seasonal allergic rhinitis due to pollen Allergic rhinitis due to animal dander Allergic rhinitis due to dust mite Allergic rhinitis due to mold Allergic conjunctivitis of both eyes Past history - on AIT at age 55 and age 52s for less than 1 year.  Had some pruritus after the last injection so she stopped.  3 cats and 2 dogs at home.  2023 skin testing showed: Positive to grass, weed, ragweed, trees, cat, dog, mold. Started AIT on 01/30/2023 (G-RW-[id number]-T and M-C-D). Interim history - stable.  Continue environmental control measures. Use over the counter antihistamines such as Zyrtec (cetirizine), Claritin (loratadine), Allegra (fexofenadine), or Xyzal (levocetirizine) daily as needed. May take twice a day during allergy flares. May switch antihistamines every few months. May take twice a day the of injections and the day after injections.  Continue Singulair (montelukast) 10mg  daily at night. Continue Ryaltris (olopatadine + mometasone nasal spray combination) 1-2 sprays per nostril twice a day.  Prescription sent to [facility].  Nasal saline spray (i.e., Simply Saline) or nasal saline lavage (i.e., NeilMed) is recommended as needed and prior to medicated nasal sprays. May use over the counter eye drops as needed. Continue allergy injections. .   Mild intermittent asthma without complication Past history - flares with URIs Interim history - didn't use Airsupra.  May use Airsupra rescue inhaler 2 puffs every 4 to 6 hours as needed for shortness of breath, chest tightness, coughing, and wheezing. Do not use more  than 12 puffs in 24 hours. May use Airsupra rescue inhaler 2 puffs 5 to 15 minutes prior to strenuous physical activities. Rinse mouth after each use.  Monitor frequency of use - if you  need to use it more than twice per week on a consistent basis let us know.   Rash  Past history - patch testing Positive to balsam of [place], imidazolidinyl urea, and 4-phenylenediamine base (done by derm) and off Dupixent now.  Interim history - increased itching and red little bumps on face and neck.  Current rash does not resemble typical eczema. Continue proper skin care. Start medrol pak. If you notice significant improvement in your rash within a few days let me know. Keep track of rashes and take pictures. Keep dermatology appointment in June.  I'm going to email you a safe list.  Make sure you use items from the safelist. Consider patch testing at our office in the future - we will be having the NAC-80 patches available in 2025.  Don't use the honey cream. Take hydroxyzine 25mg  1 hour before bedtime as needed for itching.     Return in about 6 months (around 05/27/2024).  Meds ordered this encounter  Medications   Olopatadine-Mometasone (RYALTRIS) 665-25 MCG/ACT SUSP    Sig: Place 1-2 sprays into the nose in the morning and at bedtime.    Dispense:  29 g    Refill:  5    [phone]   hydrOXYzine (ATARAX) 25 MG tablet    Sig: Take 1 tablet (25 mg total) by mouth at bedtime as needed for itching.    Dispense:  30 tablet    Refill:  3   methylPREDNISolone (MEDROL DOSEPAK) 4 MG TBPK tablet    Sig: Take 6 tablets on day 1, 5 tablets on day 2, 4 tabs on day 3, 3 tabs on day 4, 2 tabs on day 5, 1 tab on day 6.    Dispense:  21 tablet    Refill:  0   Lab Orders  No laboratory test(s) ordered today    Diagnostics: None.   Medication List:  Current Outpatient Medications  Medication Sig Dispense Refill   albuterol (VENTOLIN HFA) 108 (90 Base) MCG/ACT inhaler Inhale 2 puffs into the lungs every 6 (six) hours as needed for wheezing or shortness of breath. 18 g 2   Albuterol-Budesonide (AIRSUPRA) 90-80 MCG/ACT AERO Inhale 2 puffs into the lungs every 4 (four) hours as needed  (coughing, wheezing, chest tightness). Do not exceed 12 puffs in 24 hours. 10.7 g 2   ALPRAZolam (XANAX) 0.25 MG tablet Take 1 tablet by mouth 2 (two) times daily as needed for anxiety. 10 tablet 0   atorvastatin (LIPITOR) 20 MG tablet Take 1 tablet (20 mg total) by mouth at bedtime. 90 tablet 1   Brimonidine Tartrate (LUMIFY) 0.025 % SOLN Place 1 drop into both eyes 2 (two) times daily as needed (eye irritation.). 7.5 mL 5   calcium carbonate (OSCAL) 1500 (600 Ca) MG TABS tablet Take 600 mg of elemental calcium by mouth daily with breakfast.     CALCIUM CITRATE PO Take 600 mg by mouth in the morning.     Cholecalciferol (VITAMIN D) 50 MCG (2000 UT) CAPS Take 1 capsule (2,000 Units total) by mouth in the morning. 30 capsule 5   clindamycin (CLEOCIN T) 1 % external solution Apply a small amount to skin 2 - 3 times a week for acne (Patient taking differently: Apply 1 Application topically daily as needed (  acne).) 60 mL 5   Dermatological Products, Misc. [facility]) lotion Apply 1 Application topically in the morning and at bedtime.     EPINEPHrine 0.3 mg/0.3 mL IJ SOAJ injection Inject 0.3 mg into the muscle as needed for anaphylaxis. 2 each 1   famotidine (PEPCID) 40 MG tablet Take 40 mg by mouth at bedtime.     Ferrous Sulfate (IRON PO) Take 1 tablet by mouth daily.     fexofenadine (ALLEGRA) 180 MG tablet Take 180 mg by mouth daily.     hydrocortisone 2.5 % ointment Apply 1 Application topically 2 (two) times daily as needed. 28.35 g 2   hydroxypropyl methylcellulose / hypromellose (ISOPTO TEARS / GONIOVISC) 2.5 % ophthalmic solution Place 1 drop into both eyes 3 (three) times daily as needed for dry eyes.     hydrOXYzine (ATARAX) 25 MG tablet Take 1 tablet (25 mg total) by mouth at bedtime as needed for itching. 30 tablet 3   ibuprofen (ADVIL,MOTRIN) 200 MG tablet Take 400-800 mg by mouth every 6 (six) hours as needed for headache or mild pain.     methylPREDNISolone (MEDROL DOSEPAK) 4 MG TBPK  tablet Take 6 tablets on day 1, 5 tablets on day 2, 4 tabs on day 3, 3 tabs on day 4, 2 tabs on day 5, 1 tab on day 6. 21 tablet 0   montelukast (SINGULAIR) 10 MG tablet Take 1 tablet (10 mg total) by mouth at bedtime as needed. 90 tablet 3   Multiple Vitamin (MULTI-VITAMIN DAILY PO) Take 1 tablet by mouth in the morning.     naphazoline-pheniramine (ALLERGY EYE) 0.025-0.3 % ophthalmic solution Place 1 drop into both eyes 3 (three) times daily as needed for eye irritation or allergies.     venlafaxine XR (EFFEXOR-XR) 75 MG 24 hr capsule Take 1 capsule (75 mg total) by mouth daily with breakfast. 90 capsule 1   Olopatadine-Mometasone (RYALTRIS) 665-25 MCG/ACT SUSP Place 1-2 sprays into the nose in the morning and at bedtime. 29 g 5   No current facility-administered medications for this visit.   Allergies: No Known Allergies I reviewed her past medical history, social history, family history, and environmental history and no significant changes have been reported from her previous visit.  Review of Systems  Constitutional:  Negative for appetite change, chills, fever and unexpected weight change.  HENT:  Negative for congestion, postnasal drip, rhinorrhea and sneezing.   Eyes:  Negative for itching.  Respiratory:  Negative for cough, chest tightness, shortness of breath and wheezing.   Cardiovascular:  Negative for chest pain.  Gastrointestinal:  Negative for abdominal pain.  Genitourinary:  Negative for difficulty urinating.  Skin:  Positive for rash.  Allergic/Immunologic: Positive for environmental allergies.  Neurological:  Negative for headaches.    Objective: BP 116/82 (BP Location: Right Arm, Patient Position: Sitting, Cuff Size: Normal)   Pulse 72   Temp 98.2 F (36.8 C) (Temporal)   Resp 14   Ht 5\' 1"  (1.549 m)   Wt 172 lb 11.2 oz (78.3 kg)   LMP  (LMP Unknown)   SpO2 97%   BMI 32.63 kg/m  Body mass index is 32.63 kg/m. Physical Exam Vitals and nursing note reviewed.   Constitutional:      Appearance: Normal appearance. She is well-developed.  HENT:     Head: Normocephalic and atraumatic.     Right Ear: Tympanic membrane and external ear normal.     Left Ear: Tympanic membrane and external ear normal.  Nose: Nose normal.     Mouth/Throat:     Mouth: Mucous membranes are moist.     Pharynx: Oropharynx is clear.  Eyes:     Conjunctiva/sclera: Conjunctivae normal.  Cardiovascular:     Rate and Rhythm: Normal rate and regular rhythm.     Heart sounds: Normal heart sounds. No murmur heard.    No friction rub. No gallop.  Pulmonary:     Effort: Pulmonary effort is normal.     Breath sounds: Normal breath sounds. No wheezing, rhonchi or rales.  Musculoskeletal:     Cervical back: Neck supple.  Skin:    General: Skin is warm.     Findings: Rash present.     Comments: Erythematous hue on the face. Dry skin with excoriations on upper back. Scattered papular rash on hairline.   Neurological:     Mental Status: She is alert and oriented to person, place, and time.  Psychiatric:        Behavior: Behavior normal.   Previous notes and tests were reviewed. The plan was reviewed with the patient/family, and all questions/concerned were addressed.  It was my pleasure to see Tricia Potts today and participate in her care. Please feel free to contact me with any questions or concerns.  Sincerely,  Wyline Mood, DO Allergy & Immunology  [facility] office: [phone] [facility] office: [phone]

## 2023-11-27 NOTE — Patient Instructions (Addendum)
Environmental allergies 2023 skin testing positive to grass, weed, ragweed, trees, cat, dog, mold. Continue environmental control measures. Use over the counter antihistamines such as Zyrtec (cetirizine), Claritin (loratadine), Allegra (fexofenadine), or Xyzal (levocetirizine) daily as needed. May take twice a day during allergy flares. May switch antihistamines every few months. May take twice a day the of injections and the day after injections.  Continue Singulair (montelukast) 10mg  daily at night. Continue Ryaltris (olopatadine + mometasone nasal spray combination) 1-2 sprays per nostril twice a day.  Prescription sent to [facility].  Nasal saline spray (i.e., Simply Saline) or nasal saline lavage (i.e., NeilMed) is recommended as needed and prior to medicated nasal sprays. May use over the counter eye drops as needed. Continue allergy injections.   Asthma May use Airsupra rescue inhaler 2 puffs every 4 to 6 hours as needed for shortness of breath, chest tightness, coughing, and wheezing. Do not use more than 12 puffs in 24 hours. May use Airsupra rescue inhaler 2 puffs 5 to 15 minutes prior to strenuous physical activities. Rinse mouth after each use.  Monitor frequency of use - if you need to use it more than twice per week on a consistent basis let us know.  Breathing control goals:  Full participation in all desired activities (may need albuterol before activity) Albuterol use two times or less a week on average (not counting use with activity) Cough interfering with sleep two times or less a month Oral steroids no more than once a year No hospitalizations   Skin  Continue proper skin care. Start medrol pak. If you notice significant improvement in your rash within a few days let me know. Keep track of rashes and take pictures.  Keep dermatology appointment in June.  I'm going to email you a safe list.  Make sure you use items from the safelist.  Consider patch testing at our  office in the future - we will be having the NAC-80 patches available in 2025.  Don't use the honey cream. Take hydroxyzine 25mg  1 hour before bedtime as needed for itching.   Follow up in 6 months or sooner if needed.   Skin care recommendations  Bath time: Always use lukewarm water. AVOID very hot or cold water. Keep bathing time to 5-10 minutes. Do NOT use bubble bath. Use a mild soap and use just enough to wash the dirty areas. Do NOT scrub skin vigorously.  After bathing, pat dry your skin with a towel. Do NOT rub or scrub the skin.  Moisturizers and prescriptions:  ALWAYS apply moisturizers immediately after bathing (within 3 minutes). This helps to lock-in moisture. Use the moisturizer several times a day over the whole body. Good summer moisturizers include: Aveeno, CeraVe, Cetaphil. Good winter moisturizers include: Aquaphor, Vaseline, Cerave, Cetaphil, Eucerin, Vanicream. When using moisturizers along with medications, the moisturizer should be applied about one hour after applying the medication to prevent diluting effect of the medication or moisturize around where you applied the medications. When not using medications, the moisturizer can be continued twice daily as maintenance.  Laundry and clothing: Avoid laundry products with added color or perfumes. Use unscented hypo-allergenic laundry products such as Tide free, Cheer free & gentle, and All free and clear.  If the skin still seems dry or sensitive, you can try double-rinsing the clothes. Avoid tight or scratchy clothing such as wool. Do not use fabric softeners or dyer sheets.

## 2023-12-02 ENCOUNTER — Other Ambulatory Visit (HOSPITAL_COMMUNITY): Payer: Self-pay

## 2023-12-03 DIAGNOSIS — J301 Allergic rhinitis due to pollen: Secondary | ICD-10-CM | POA: Diagnosis not present

## 2023-12-03 NOTE — Progress Notes (Signed)
VIALS EXP 12-02-24

## 2023-12-05 ENCOUNTER — Other Ambulatory Visit: Payer: Self-pay

## 2023-12-10 ENCOUNTER — Ambulatory Visit (INDEPENDENT_AMBULATORY_CARE_PROVIDER_SITE_OTHER): Payer: Self-pay | Admitting: *Deleted

## 2023-12-10 DIAGNOSIS — J309 Allergic rhinitis, unspecified: Secondary | ICD-10-CM

## 2023-12-17 ENCOUNTER — Ambulatory Visit (INDEPENDENT_AMBULATORY_CARE_PROVIDER_SITE_OTHER): Payer: 59 | Admitting: *Deleted

## 2023-12-17 DIAGNOSIS — J309 Allergic rhinitis, unspecified: Secondary | ICD-10-CM

## 2023-12-24 ENCOUNTER — Ambulatory Visit (INDEPENDENT_AMBULATORY_CARE_PROVIDER_SITE_OTHER): Payer: 59 | Admitting: *Deleted

## 2023-12-24 DIAGNOSIS — J309 Allergic rhinitis, unspecified: Secondary | ICD-10-CM

## 2024-01-07 ENCOUNTER — Ambulatory Visit (INDEPENDENT_AMBULATORY_CARE_PROVIDER_SITE_OTHER): Payer: 59 | Admitting: *Deleted

## 2024-01-07 DIAGNOSIS — J309 Allergic rhinitis, unspecified: Secondary | ICD-10-CM

## 2024-01-10 ENCOUNTER — Other Ambulatory Visit: Payer: Self-pay | Admitting: Emergency Medicine

## 2024-01-10 ENCOUNTER — Other Ambulatory Visit: Payer: Self-pay | Admitting: Family Medicine

## 2024-01-10 ENCOUNTER — Telehealth: Payer: Self-pay

## 2024-01-10 ENCOUNTER — Encounter: Payer: Self-pay | Admitting: Family Medicine

## 2024-01-10 ENCOUNTER — Other Ambulatory Visit (HOSPITAL_BASED_OUTPATIENT_CLINIC_OR_DEPARTMENT_OTHER): Payer: Self-pay

## 2024-01-10 MED ORDER — FAMOTIDINE 40 MG PO TABS
40.0000 mg | ORAL_TABLET | Freq: Every day | ORAL | 1 refills | Status: AC
  Filled 2024-01-10: qty 90, 90d supply, fill #0
  Filled 2024-05-06: qty 90, 90d supply, fill #1

## 2024-01-10 NOTE — Telephone Encounter (Signed)
Copied from CRM [id number]. Topic: Clinical - Prescription Issue >> Jan 10, 2024  3:42 PM Adaysia C wrote: Reason for CRM: Patient wanted to inform the provider that the note to send the medication, famotidine (PEPCID) 40 MG tablet, to her home was meant for the pharmacy not the provider. The RX refill should still be sent too [facility] [address], [place] [address] Phone: [phone]  Fax: [phone]

## 2024-01-11 ENCOUNTER — Other Ambulatory Visit (HOSPITAL_COMMUNITY): Payer: Self-pay

## 2024-01-13 ENCOUNTER — Other Ambulatory Visit (HOSPITAL_COMMUNITY): Payer: Self-pay

## 2024-01-13 ENCOUNTER — Other Ambulatory Visit: Payer: Self-pay

## 2024-01-14 ENCOUNTER — Ambulatory Visit (INDEPENDENT_AMBULATORY_CARE_PROVIDER_SITE_OTHER): Payer: Self-pay | Admitting: *Deleted

## 2024-01-14 DIAGNOSIS — J309 Allergic rhinitis, unspecified: Secondary | ICD-10-CM | POA: Diagnosis not present

## 2024-01-17 ENCOUNTER — Other Ambulatory Visit (HOSPITAL_COMMUNITY): Payer: Self-pay

## 2024-01-17 ENCOUNTER — Other Ambulatory Visit: Payer: Self-pay

## 2024-01-23 ENCOUNTER — Ambulatory Visit (INDEPENDENT_AMBULATORY_CARE_PROVIDER_SITE_OTHER): Payer: 59

## 2024-01-23 DIAGNOSIS — J309 Allergic rhinitis, unspecified: Secondary | ICD-10-CM

## 2024-01-28 ENCOUNTER — Other Ambulatory Visit (HOSPITAL_COMMUNITY): Payer: Self-pay

## 2024-01-29 ENCOUNTER — Ambulatory Visit (INDEPENDENT_AMBULATORY_CARE_PROVIDER_SITE_OTHER): Payer: Self-pay | Admitting: *Deleted

## 2024-01-29 DIAGNOSIS — J309 Allergic rhinitis, unspecified: Secondary | ICD-10-CM | POA: Diagnosis not present

## 2024-02-10 ENCOUNTER — Ambulatory Visit (INDEPENDENT_AMBULATORY_CARE_PROVIDER_SITE_OTHER): Payer: Self-pay

## 2024-02-10 DIAGNOSIS — J309 Allergic rhinitis, unspecified: Secondary | ICD-10-CM | POA: Diagnosis not present

## 2024-02-12 ENCOUNTER — Other Ambulatory Visit: Payer: Self-pay | Admitting: Family Medicine

## 2024-02-12 ENCOUNTER — Encounter: Payer: Self-pay | Admitting: Family Medicine

## 2024-02-12 MED ORDER — VENLAFAXINE HCL ER 37.5 MG PO CP24
37.5000 mg | ORAL_CAPSULE | Freq: Every day | ORAL | 1 refills | Status: DC
  Filled 2024-02-12: qty 90, 90d supply, fill #0

## 2024-02-13 ENCOUNTER — Other Ambulatory Visit (HOSPITAL_COMMUNITY): Payer: Self-pay

## 2024-02-15 ENCOUNTER — Encounter: Payer: Self-pay | Admitting: Family Medicine

## 2024-02-17 ENCOUNTER — Other Ambulatory Visit: Payer: Self-pay | Admitting: Family

## 2024-02-17 ENCOUNTER — Other Ambulatory Visit: Payer: Self-pay

## 2024-02-17 ENCOUNTER — Other Ambulatory Visit (HOSPITAL_COMMUNITY): Payer: Self-pay

## 2024-02-17 MED ORDER — MINOCYCLINE HCL 50 MG PO TABS
50.0000 mg | ORAL_TABLET | Freq: Every day | ORAL | 1 refills | Status: DC
  Filled 2024-02-17: qty 30, 30d supply, fill #0

## 2024-02-19 ENCOUNTER — Ambulatory Visit (INDEPENDENT_AMBULATORY_CARE_PROVIDER_SITE_OTHER): Payer: 59 | Admitting: *Deleted

## 2024-02-19 DIAGNOSIS — J309 Allergic rhinitis, unspecified: Secondary | ICD-10-CM | POA: Diagnosis not present

## 2024-02-25 ENCOUNTER — Other Ambulatory Visit (HOSPITAL_COMMUNITY): Payer: Self-pay

## 2024-02-25 ENCOUNTER — Ambulatory Visit (INDEPENDENT_AMBULATORY_CARE_PROVIDER_SITE_OTHER): Payer: Self-pay

## 2024-02-25 DIAGNOSIS — J309 Allergic rhinitis, unspecified: Secondary | ICD-10-CM | POA: Diagnosis not present

## 2024-02-25 MED ORDER — MINOCYCLINE HCL 50 MG PO CAPS
50.0000 mg | ORAL_CAPSULE | Freq: Two times a day (BID) | ORAL | 1 refills | Status: DC
  Filled 2024-02-25 – 2024-03-17 (×2): qty 30, 15d supply, fill #0
  Filled 2024-03-29: qty 30, 15d supply, fill #1

## 2024-02-28 ENCOUNTER — Other Ambulatory Visit: Payer: Self-pay

## 2024-02-28 MED ORDER — RYALTRIS 665-25 MCG/ACT NA SUSP: 1.0000 | Freq: Two times a day (BID) | NASAL | 5 refills | Status: AC

## 2024-03-04 ENCOUNTER — Ambulatory Visit (INDEPENDENT_AMBULATORY_CARE_PROVIDER_SITE_OTHER): Payer: Self-pay | Admitting: *Deleted

## 2024-03-04 DIAGNOSIS — J309 Allergic rhinitis, unspecified: Secondary | ICD-10-CM | POA: Diagnosis not present

## 2024-03-09 ENCOUNTER — Ambulatory Visit: Payer: 59 | Admitting: Allergy

## 2024-03-10 ENCOUNTER — Other Ambulatory Visit (HOSPITAL_COMMUNITY): Payer: Self-pay

## 2024-03-12 ENCOUNTER — Ambulatory Visit (INDEPENDENT_AMBULATORY_CARE_PROVIDER_SITE_OTHER): Payer: Self-pay

## 2024-03-12 DIAGNOSIS — J309 Allergic rhinitis, unspecified: Secondary | ICD-10-CM | POA: Diagnosis not present

## 2024-03-16 ENCOUNTER — Other Ambulatory Visit (HOSPITAL_COMMUNITY): Payer: Self-pay

## 2024-03-17 ENCOUNTER — Ambulatory Visit (INDEPENDENT_AMBULATORY_CARE_PROVIDER_SITE_OTHER): Payer: Self-pay | Admitting: *Deleted

## 2024-03-17 ENCOUNTER — Other Ambulatory Visit (HOSPITAL_COMMUNITY): Payer: Self-pay

## 2024-03-17 DIAGNOSIS — J309 Allergic rhinitis, unspecified: Secondary | ICD-10-CM

## 2024-03-24 ENCOUNTER — Ambulatory Visit (INDEPENDENT_AMBULATORY_CARE_PROVIDER_SITE_OTHER): Payer: Self-pay | Admitting: *Deleted

## 2024-03-24 DIAGNOSIS — J309 Allergic rhinitis, unspecified: Secondary | ICD-10-CM

## 2024-03-24 NOTE — Assessment & Plan Note (Signed)
 [id number] acceptable, minimize simple carbs. Increase exercise as tolerated.

## 2024-03-24 NOTE — Assessment & Plan Note (Signed)
 Supplement and monitor

## 2024-03-24 NOTE — Assessment & Plan Note (Signed)
 Encouraged to get adequate exercise, calcium and vitamin d intake

## 2024-03-24 NOTE — Assessment & Plan Note (Signed)
 No recent exacerbation

## 2024-03-24 NOTE — Assessment & Plan Note (Signed)
 Encourage heart healthy diet such as MIND or DASH diet, increase exercise, avoid trans fats, simple carbohydrates and processed foods, consider a krill or fish or flaxseed oil cap daily.

## 2024-03-25 ENCOUNTER — Encounter: Payer: Self-pay | Admitting: Allergy

## 2024-03-25 NOTE — Telephone Encounter (Signed)
 Patient received .30mL out of her injection vials last night. She has been having issues with large local reactions despite EpiRinse.

## 2024-03-26 ENCOUNTER — Encounter (HOSPITAL_COMMUNITY): Payer: Self-pay

## 2024-03-26 ENCOUNTER — Ambulatory Visit: Payer: 59 | Admitting: Family Medicine

## 2024-03-26 ENCOUNTER — Other Ambulatory Visit (HOSPITAL_COMMUNITY): Payer: Self-pay

## 2024-03-26 VITALS — BP 130/78 | HR 91 | Temp 98.6°F | Resp 18 | Ht 63.0 in | Wt 171.0 lb

## 2024-03-26 DIAGNOSIS — M8580 Other specified disorders of bone density and structure, unspecified site: Secondary | ICD-10-CM | POA: Diagnosis not present

## 2024-03-26 DIAGNOSIS — J45909 Unspecified asthma, uncomplicated: Secondary | ICD-10-CM | POA: Diagnosis not present

## 2024-03-26 DIAGNOSIS — G8929 Other chronic pain: Secondary | ICD-10-CM

## 2024-03-26 DIAGNOSIS — E559 Vitamin D deficiency, unspecified: Secondary | ICD-10-CM | POA: Diagnosis not present

## 2024-03-26 DIAGNOSIS — E782 Mixed hyperlipidemia: Secondary | ICD-10-CM

## 2024-03-26 DIAGNOSIS — R739 Hyperglycemia, unspecified: Secondary | ICD-10-CM

## 2024-03-26 DIAGNOSIS — M25572 Pain in left ankle and joints of left foot: Secondary | ICD-10-CM

## 2024-03-26 MED ORDER — ATORVASTATIN CALCIUM 20 MG PO TABS
ORAL_TABLET | ORAL | 1 refills | Status: DC
  Filled 2024-03-26 – 2024-04-07 (×2): qty 120, 90d supply, fill #0
  Filled 2024-07-20: qty 120, 90d supply, fill #1

## 2024-03-26 MED ORDER — AZELAIC ACID 15 % EX GEL
CUTANEOUS | 0 refills | Status: AC
  Filled 2024-03-26: qty 50, fill #0

## 2024-03-26 NOTE — Patient Instructions (Addendum)
 Cbd Daily cream with menthol can obtain at the [employer]  Call for f/u colonoscopy

## 2024-03-26 NOTE — Telephone Encounter (Signed)
 Allergy Flowsheet has been updated to reflect these changes.

## 2024-03-27 ENCOUNTER — Encounter: Payer: Self-pay | Admitting: Family Medicine

## 2024-03-27 ENCOUNTER — Other Ambulatory Visit (HOSPITAL_COMMUNITY): Payer: Self-pay

## 2024-03-27 NOTE — Progress Notes (Signed)
 Subjective:    Patient ID: Tricia Potts, female    DOB: 1971-12-01, 53 y.o.   MRN: [id number]  Chief Complaint  Patient presents with  . Follow-up    HPI Discussed the use of AI scribe software for clinical note transcription with the patient, who gave verbal consent to proceed.  History of Present Illness Tricia Potts is a 53 year old female who presents with left ankle pain and hardware concerns.  She experiences increased pain in her left ankle, which contains hardware from a previous surgery in 2001. The pain has become more pronounced recently, especially at night, necessitating the use of Advil for relief. The pain is localized to the ankle and foot area, with certain movements exacerbating the discomfort. She has not had any recent imaging or consultations regarding the hardware. She has tried various types of footwear, including Brooks and Earth shoes, finding Brooks to be the most comfortable. Hoka sneakers were less comfortable and had wear issues. She has flat feet and bunions, which contribute to her foot problems.  She manages her cholesterol with atorvastatin, taking two pills on Tuesdays and Saturdays. She also takes vitamin D regularly. She has been on venlafaxine, recently reducing to half a pill, and is considering discontinuing it completely. Alprazolam is used very rarely for emergencies.  She has a history of fingertip dermatitis and uses tacrolimus and a compounded cream containing azelaic acid, metronidazole, and ivermectin for skin issues around her nose and perioral area. She has been diagnosed with various skin conditions, including eczema and rosacea, but there is some uncertainty about the exact diagnosis.    Past Medical History:  Diagnosis Date  . Allergy   . Anemia 06/18/2014  . Anxiety   . Anxiety and depression 02/17/2009   Qualifier: Diagnosis of  By: Andrey Campanile MD, Raliegh Ip    . Asthma    mild - rarely uses inhaler  . Broken ankle 2011   (left)  roller skating  . Bronchitis, mucopurulent recurrent (HCC) 04/21/2013  . Cervical cancer screening 02/20/2016  . Depression   . Eczema   . GERD (gastroesophageal reflux disease)   . Hemorrhoid   . History of hidradenitis suppurativa   . Hyperlipemia   . IBS (irritable bowel syndrome)   . Kidney infection    as a child  . Low back pain 05/25/2016  . Moderate obstructive sleep apnea 04/15/2023  . Osteopenia   . PCO (polycystic ovaries)   . Pneumonia   . Pruritus 08/22/2017    Past Surgical History:  Procedure Laterality Date  . ANKLE SURGERY Left    plate and 8 screws in left ankle  . CARPAL TUNNEL RELEASE Left 11/05/2022   Procedure: Carpel Tunnel Release - left;  Surgeon: Tia Alert, MD;  Location: [facility] OR;  Service: Neurosurgery;  Laterality: Left;  . EYE SURGERY     Lasik  . HEMORRHOID SURGERY    . WISDOM TOOTH EXTRACTION      Family History  Problem Relation Age of Onset  . Allergic rhinitis Mother   . Eczema Mother   . Asthma Mother   . Allergies Mother   . COPD Mother   . Irritable bowel syndrome Mother   . Allergic rhinitis Father   . Heart disease Father        mitral valve disease/rupture during physical stress  . GER disease Father   . Irritable bowel syndrome Father   . Heart failure Father   . Allergic rhinitis Sister   .  Allergies Sister   . Eczema Sister   . Cancer Sister 73       breast, DCIS  . Allergic rhinitis Sister   . Allergic rhinitis Brother   . Hepatitis C Brother   . Cancer Brother 17       ALL  . Cancer Maternal Grandmother        ovarian cancer  . Heart disease Maternal Grandfather        MI at 56  . Kidney disease Paternal Grandmother   . Kidney disease Paternal Grandfather        possible kidney cancer  . Heart disease Daughter        asd s/p repair at age 38  . Anxiety disorder Daughter   . Allergies Daughter   . Irritable bowel syndrome Niece   . Ovarian cancer Other        Grandmother  . Arthritis Other   . Colon  cancer Neg Hx   . Rectal cancer Neg Hx   . Esophageal cancer Neg Hx   . Stomach cancer Neg Hx     Social History   Socioeconomic History  . Marital status: Married    Spouse name: Not on file  . Number of children: 3  . Years of education: Not on file  . Highest education level: Bachelor's degree (e.g., BA, AB, BS)  Occupational History  . Occupation: Teacher, adult education: [employer]  Tobacco Use  . Smoking status: Never    Passive exposure: Past  . Smokeless tobacco: Never  Vaping Use  . Vaping status: Never Used  Substance and Sexual Activity  . Alcohol use: No  . Drug use: No  . Sexual activity: Yes    Partners: Male    Birth control/protection: Post-menopausal  Other Topics Concern  . Not on file  Social History Narrative   Works in interventional radiology-RN [employer]   Married   3 daughters   Social Drivers of Health   Financial Resource Strain: Low Risk  (03/25/2024)   Overall Financial Resource Strain (CARDIA)   . Difficulty of Paying Living Expenses: Not hard at all  Food Insecurity: No Food Insecurity (03/25/2024)   Hunger Vital Sign   . Worried About Programme researcher, broadcasting/film/video in the Last Year: Never true   . Ran Out of Food in the Last Year: Never true  Transportation Needs: No Transportation Needs (03/25/2024)   PRAPARE - Transportation   . Lack of Transportation (Medical): No   . Lack of Transportation (Non-Medical): No  Physical Activity: Unknown (03/25/2024)   Exercise Vital Sign   . Days of Exercise per Week: 0 days   . Minutes of Exercise per Session: Patient declined  Stress: No Stress Concern Present (03/25/2024)   [employer] of Occupational Health - Occupational Stress Questionnaire   . Feeling of Stress : Only a little  Social Connections: Socially Integrated (03/25/2024)   Social Connection and Isolation Panel [NHANES]   . Frequency of Communication with Friends and Family: More than three times a week   . Frequency of Social Gatherings with Friends and  Family: More than three times a week   . Attends Religious Services: More than 4 times per year   . Active Member of Clubs or Organizations: Yes   . Attends Banker Meetings: More than 4 times per year   . Marital Status: Married  Catering manager Violence: Not on file    Outpatient Medications Prior to Visit  Medication  Sig Dispense Refill  . albuterol (VENTOLIN HFA) 108 (90 Base) MCG/ACT inhaler Inhale 2 puffs into the lungs every 6 (six) hours as needed for wheezing or shortness of breath. 18 g 2  . Albuterol-Budesonide (AIRSUPRA) 90-80 MCG/ACT AERO Inhale 2 puffs into the lungs every 4 (four) hours as needed (coughing, wheezing, chest tightness). Do not exceed 12 puffs in 24 hours. 10.7 g 2  . ALPRAZolam (XANAX) 0.25 MG tablet Take 1 tablet by mouth 2 (two) times daily as needed for anxiety. 10 tablet 0  . Brimonidine Tartrate (LUMIFY) 0.025 % SOLN Place 1 drop into both eyes 2 (two) times daily as needed (eye irritation.). 7.5 mL 5  . calcium carbonate (OSCAL) 1500 (600 Ca) MG TABS tablet Take 600 mg of elemental calcium by mouth daily with breakfast.    . Cholecalciferol (VITAMIN D) 50 MCG (2000 UT) CAPS Take 1 capsule (2,000 Units total) by mouth in the morning. 30 capsule 5  . clindamycin (CLEOCIN T) 1 % external solution Apply a small amount to skin 2 - 3 times a week for acne (Patient taking differently: Apply 1 Application topically daily as needed (acne).) 60 mL 5  . Dermatological Products, Misc. [facility]) lotion Apply 1 Application topically in the morning and at bedtime.    Marland Kitchen EPINEPHrine 0.3 mg/0.3 mL IJ SOAJ injection Inject 0.3 mg into the muscle as needed for anaphylaxis. 2 each 1  . famotidine (PEPCID) 40 MG tablet Take 1 tablet (40 mg total) by mouth at bedtime. 90 tablet 1  . hydrocortisone 2.5 % ointment Apply 1 Application topically 2 (two) times daily as needed. 28.35 g 2  . hydroxypropyl methylcellulose / hypromellose (ISOPTO TEARS / GONIOVISC) 2.5 %  ophthalmic solution Place 1 drop into both eyes 3 (three) times daily as needed for dry eyes.    . hydrOXYzine (ATARAX) 25 MG tablet Take 1 tablet (25 mg total) by mouth at bedtime as needed for itching. 30 tablet 3  . ibuprofen (ADVIL,MOTRIN) 200 MG tablet Take 400-800 mg by mouth every 6 (six) hours as needed for headache or mild pain.    . minocycline (MINOCIN) 50 MG capsule Take 1 capsule (50 mg total) by mouth 2 (two) times daily. 30 capsule 1  . montelukast (SINGULAIR) 10 MG tablet Take 1 tablet (10 mg total) by mouth at bedtime as needed. 90 tablet 3  . Multiple Vitamin (MULTI-VITAMIN DAILY PO) Take 1 tablet by mouth in the morning.    . naphazoline-pheniramine (ALLERGY EYE) 0.025-0.3 % ophthalmic solution Place 1 drop into both eyes 3 (three) times daily as needed for eye irritation or allergies.    . Niacinamide-Tacrolimus 4-0.1 % CREA Apply 1 Dose topically 2 (two) times daily as needed.    Marrion Coy (RYALTRIS) [id number] MCG/ACT SUSP Place 1-2 sprays into the nose in the morning and at bedtime. 29 g 5  . venlafaxine XR (EFFEXOR XR) 37.5 MG 24 hr capsule Take 1 capsule (37.5 mg total) by mouth daily with breakfast. 90 capsule 1  . atorvastatin (LIPITOR) 20 MG tablet Take 1 tablet (20 mg total) by mouth at bedtime. 90 tablet 1  . CALCIUM CITRATE PO Take 600 mg by mouth in the morning.    . Ferrous Sulfate (IRON PO) Take 1 tablet by mouth daily.    . fexofenadine (ALLEGRA) 180 MG tablet Take 180 mg by mouth daily.    . methylPREDNISolone (MEDROL DOSEPAK) 4 MG TBPK tablet Take 6 tablets on day 1, 5 tablets on day 2,  4 tabs on day 3, 3 tabs on day 4, 2 tabs on day 5, 1 tab on day 6. 21 tablet 0   No facility-administered medications prior to visit.    No Known Allergies  Review of Systems  Constitutional:  Negative for fever and malaise/fatigue.  HENT:  Negative for congestion.   Eyes:  Negative for blurred vision.  Respiratory:  Negative for shortness of breath.    Cardiovascular:  Negative for chest pain, palpitations and leg swelling.  Gastrointestinal:  Negative for abdominal pain, blood in stool and nausea.  Genitourinary:  Negative for dysuria and frequency.  Musculoskeletal:  Positive for joint pain. Negative for falls.  Skin:  Positive for itching and rash.  Neurological:  Negative for dizziness, loss of consciousness and headaches.  Endo/Heme/Allergies:  Negative for environmental allergies.  Psychiatric/Behavioral:  Negative for depression. The patient is nervous/anxious.        Objective:    Physical Exam Constitutional:      General: She is not in acute distress.    Appearance: Normal appearance. She is well-developed. She is not toxic-appearing.  HENT:     Head: Normocephalic and atraumatic.     Right Ear: External ear normal.     Left Ear: External ear normal.     Nose: Nose normal.  Eyes:     General:        Right eye: No discharge.        Left eye: No discharge.     Conjunctiva/sclera: Conjunctivae normal.  Neck:     Thyroid: No thyromegaly.  Cardiovascular:     Rate and Rhythm: Normal rate and regular rhythm.     Heart sounds: Normal heart sounds. No murmur heard. Pulmonary:     Effort: Pulmonary effort is normal. No respiratory distress.     Breath sounds: Normal breath sounds.  Abdominal:     General: Bowel sounds are normal.     Palpations: Abdomen is soft.     Tenderness: There is no abdominal tenderness. There is no guarding.  Musculoskeletal:        General: Normal range of motion.     Cervical back: Neck supple.  Lymphadenopathy:     Cervical: No cervical adenopathy.  Skin:    General: Skin is warm and dry.  Neurological:     Mental Status: She is alert and oriented to person, place, and time.  Psychiatric:        Mood and Affect: Mood normal.        Behavior: Behavior normal.        Thought Content: Thought content normal.        Judgment: Judgment normal.    BP 130/78 (BP Location: Right Arm,  Patient Position: Sitting, Cuff Size: Large)   Pulse 91   Temp 98.6 F (37 C) (Oral)   Resp 18   Ht 5\' 3"  (1.6 m)   Wt 171 lb (77.6 kg)   LMP  (LMP Unknown)   SpO2 98%   BMI 30.29 kg/m  Wt Readings from Last 3 Encounters:  03/26/24 171 lb (77.6 kg)  11/27/23 172 lb 11.2 oz (78.3 kg)  09/26/23 171 lb 12.8 oz (77.9 kg)    Diabetic Foot Exam - Simple   No data filed    Lab Results  Component Value Date   WBC 6.3 09/26/2023   HGB 14.1 09/26/2023   HCT 41.9 09/26/2023   PLT 328.0 09/26/2023   GLUCOSE 89 09/26/2023   CHOL 178 09/26/2023  TRIG 190.0 (H) 09/26/2023   HDL 61.50 09/26/2023   LDLDIRECT 94.0 05/23/2023   LDLCALC 79 09/26/2023   ALT 23 09/26/2023   AST 22 09/26/2023   NA 138 09/26/2023   K 4.2 09/26/2023   CL 104 09/26/2023   CREATININE 0.66 09/26/2023   BUN 12 09/26/2023   CO2 26 09/26/2023   TSH 3.13 09/26/2023   HGBA1C 5.8 09/26/2023    Lab Results  Component Value Date   TSH 3.13 09/26/2023   Lab Results  Component Value Date   WBC 6.3 09/26/2023   HGB 14.1 09/26/2023   HCT 41.9 09/26/2023   MCV 92.5 09/26/2023   PLT 328.0 09/26/2023   Lab Results  Component Value Date   NA 138 09/26/2023   K 4.2 09/26/2023   CO2 26 09/26/2023   GLUCOSE 89 09/26/2023   BUN 12 09/26/2023   CREATININE 0.66 09/26/2023   BILITOT 0.4 09/26/2023   ALKPHOS 70 09/26/2023   AST 22 09/26/2023   ALT 23 09/26/2023   PROT 7.4 09/26/2023   ALBUMIN 4.6 09/26/2023   CALCIUM 9.7 09/26/2023   GFR 99.34 09/26/2023   Lab Results  Component Value Date   CHOL 178 09/26/2023   Lab Results  Component Value Date   HDL 61.50 09/26/2023   Lab Results  Component Value Date   LDLCALC 79 09/26/2023   Lab Results  Component Value Date   TRIG 190.0 (H) 09/26/2023   Lab Results  Component Value Date   CHOLHDL 3 09/26/2023   Lab Results  Component Value Date   HGBA1C 5.8 09/26/2023       Assessment & Plan:  Uncomplicated asthma, unspecified asthma severity,  unspecified whether persistent Assessment & Plan: No recent exacerbation   Hyperglycemia Assessment & Plan: hgba1c acceptable, minimize simple carbs. Increase exercise as tolerated.  Orders: -     Comprehensive metabolic panel with GFR -     Hemoglobin A1c -     TSH  Hyperlipidemia, mixed Assessment & Plan: Encourage heart healthy diet such as MIND or DASH diet, increase exercise, avoid trans fats, simple carbohydrates and processed foods, consider a krill or fish or flaxseed oil cap daily.   Orders: -     Lipid panel -     TSH  Osteopenia, unspecified location Assessment & Plan: Encouraged to get adequate exercise, calcium and vitamin d intake    Vitamin D deficiency Assessment & Plan: Supplement and monitor   Orders: -     VITAMIN D 25 Hydroxy (Vit-D Deficiency, Fractures)  Chronic pain of left ankle -     CBC with Differential/Platelet  Other orders -     Atorvastatin Calcium; Take one tab (20 mg) by mouth daily x 5 days a week and 2 tabs (40 mg) daily the other two days  Dispense: 120 tablet; Refill: 1 -     Azelaic Acid; After skin is thoroughly washed and patted dry, gently but thoroughly massage a thin film of azelaic acid cream into the affected area twice daily, in the morning and evening. With metronidazole/ivermectin 1%/1%  Dispense: 30 g; Refill: 0    Assessment and Plan Assessment & Plan Left ankle pain with hardware Increased pain in left ankle with existing hardware from 2001 surgery. Age-related arthritic changes may contribute. Hardware removal discussed but may not alleviate pain due to potential arthritis. Removal in adults is complex and may leave bone holes. Orthopedic evaluation needed. - Refer to orthopedist for evaluation of left ankle and hardware removal  consideration. - Discuss potential benefits and risks of hardware removal with orthopedist. - Consider alternative footwear options and consult with a podiatrist if  needed.  Hyperlipidemia Currently on atorvastatin, taking two pills on Tuesdays and Saturdays, effectively managing cholesterol levels. Prescription needs updating to reflect increased dosage. - Update atorvastatin prescription to reflect the increased dosage of two pills on Tuesdays and Saturdays.  Vitamin D deficiency Continues regular vitamin D supplementation.  Anxiety and Depression On venlafaxine XR, taking 37.5 mg daily. Desires to discontinue medication. Tapering off by taking it every other day for two weeks is advised, monitoring for increased irritability, anxiety, or depression. She has a strong support system and manages stress well. - Taper venlafaxine XR by taking it every other day for two weeks, then discontinue if no adverse effects are noted. - Monitor for increased irritability, anxiety, or depression during tapering.  Follow-up Due for follow-up in six months. Discussed possibility of virtual follow-up or seeing new nurse practitioner, Shanda Bumps, for a physical if needed before year-end. - Schedule a six-month follow-up appointment. - Check if the previous physical in October 2024 covers the insurance requirement for 2025. - Consider scheduling a physical with the new nurse practitioner, Shanda Bumps, if needed before the end of the year.     Danise Edge, MD

## 2024-03-30 ENCOUNTER — Other Ambulatory Visit (HOSPITAL_COMMUNITY): Payer: Self-pay

## 2024-03-31 ENCOUNTER — Encounter: Payer: Self-pay | Admitting: Family Medicine

## 2024-04-01 ENCOUNTER — Other Ambulatory Visit: Payer: Self-pay | Admitting: Family Medicine

## 2024-04-03 ENCOUNTER — Other Ambulatory Visit (HOSPITAL_COMMUNITY): Payer: Self-pay

## 2024-04-03 MED ORDER — MEDROXYPROGESTERONE ACETATE 5 MG PO TABS
ORAL_TABLET | ORAL | 1 refills | Status: AC
  Filled 2024-04-03: qty 30, 84d supply, fill #0
  Filled 2024-06-25: qty 30, 84d supply, fill #1

## 2024-04-03 MED ORDER — ESTRADIOL 0.025 MG/24HR TD PTWK
0.0250 mg | MEDICATED_PATCH | TRANSDERMAL | 1 refills | Status: AC
  Filled 2024-04-03: qty 12, 84d supply, fill #0
  Filled 2024-06-25: qty 12, 84d supply, fill #1

## 2024-04-04 ENCOUNTER — Other Ambulatory Visit (HOSPITAL_COMMUNITY): Payer: Self-pay

## 2024-04-07 ENCOUNTER — Other Ambulatory Visit (HOSPITAL_COMMUNITY): Payer: Self-pay

## 2024-04-07 ENCOUNTER — Ambulatory Visit (INDEPENDENT_AMBULATORY_CARE_PROVIDER_SITE_OTHER): Payer: Self-pay

## 2024-04-07 DIAGNOSIS — J309 Allergic rhinitis, unspecified: Secondary | ICD-10-CM | POA: Diagnosis not present

## 2024-04-18 ENCOUNTER — Other Ambulatory Visit: Payer: Self-pay | Admitting: Family

## 2024-04-24 ENCOUNTER — Other Ambulatory Visit: Payer: Self-pay | Admitting: Family Medicine

## 2024-04-24 ENCOUNTER — Other Ambulatory Visit (HOSPITAL_COMMUNITY): Payer: Self-pay

## 2024-04-26 MED ORDER — MINOCYCLINE HCL 50 MG PO CAPS
50.0000 mg | ORAL_CAPSULE | Freq: Two times a day (BID) | ORAL | 1 refills | Status: DC
  Filled 2024-04-26: qty 30, 15d supply, fill #0

## 2024-04-27 ENCOUNTER — Other Ambulatory Visit: Payer: Self-pay

## 2024-04-30 ENCOUNTER — Ambulatory Visit (INDEPENDENT_AMBULATORY_CARE_PROVIDER_SITE_OTHER): Payer: Self-pay

## 2024-04-30 DIAGNOSIS — J309 Allergic rhinitis, unspecified: Secondary | ICD-10-CM | POA: Diagnosis not present

## 2024-05-15 ENCOUNTER — Other Ambulatory Visit (HOSPITAL_COMMUNITY): Payer: Self-pay

## 2024-05-22 ENCOUNTER — Other Ambulatory Visit: Payer: Self-pay | Admitting: Family Medicine

## 2024-05-22 ENCOUNTER — Other Ambulatory Visit (HOSPITAL_COMMUNITY): Payer: Self-pay

## 2024-05-22 DIAGNOSIS — J302 Other seasonal allergic rhinitis: Secondary | ICD-10-CM

## 2024-05-22 MED ORDER — MONTELUKAST SODIUM 10 MG PO TABS
10.0000 mg | ORAL_TABLET | Freq: Every evening | ORAL | 3 refills | Status: AC | PRN
  Filled 2024-05-22: qty 90, 90d supply, fill #0

## 2024-05-24 NOTE — Progress Notes (Unsigned)
 Follow Up Note  RE: Tricia Potts MRN: [id number] DOB: March 19, 1971 Date of Office Visit: 05/25/2024  Referring provider: Neda Balk, MD Primary care provider: Neda Balk, MD  Chief Complaint: No chief complaint on file.  History of Present Illness: I had the pleasure of seeing Tricia Potts for a follow up visit at the [facility] on 05/24/2024. She is a 53 y.o. female, who is being followed for allergic rhinoconjunctivitis on AIT, asthma, rash. Tricia Potts previous allergy  office visit was on 11/27/2023 with Dr. Burdette Carolin. Today is a regular follow up visit.  Discussed the use of AI scribe software for clinical note transcription with the patient, who gave verbal consent to proceed.  History of Present Illness            ***  Assessment and Plan: Tricia Potts is a 53 y.o. female with: Seasonal allergic rhinitis due to pollen Allergic rhinitis due to animal dander Allergic rhinitis due to dust mite Allergic rhinitis due to mold Allergic conjunctivitis of both eyes Past history - on AIT at age 70 and age 89s for less than 1 year.  Had some pruritus after the last injection so she stopped.  3 cats and 2 dogs at home.  2023 skin testing showed: Positive to grass, weed, ragweed, trees, cat, dog, mold. Started AIT on 01/30/2023 (G-[id number] and M-C-D). Interim history - stable.  Continue environmental control measures. Use over the counter antihistamines such as Zyrtec (cetirizine), Claritin (loratadine), Allegra  (fexofenadine ), or Xyzal (levocetirizine) daily as needed. May take twice a day during allergy  flares. May switch antihistamines every few months. May take twice a day the of injections and the day after injections.  Continue Singulair  (montelukast ) 10mg  daily at night. Continue Ryaltris  (olopatadine  + mometasone  nasal spray combination) 1-2 sprays per nostril twice a day.  Prescription sent to [facility].  Nasal saline spray (i.e., Simply Saline) or nasal saline lavage  (i.e., NeilMed) is recommended as needed and prior to medicated nasal sprays. May use over the counter eye drops as needed. Continue allergy  injections. .   Mild intermittent asthma without complication Past history - flares with URIs Interim history - didn't use Airsupra .  May use Airsupra  rescue inhaler 2 puffs every 4 to 6 hours as needed for shortness of breath, chest tightness, coughing, and wheezing. Do not use more than 12 puffs in 24 hours. May use Airsupra  rescue inhaler 2 puffs 5 to 15 minutes prior to strenuous physical activities. Rinse mouth after each use.  Monitor frequency of use - if you need to use it more than twice per week on a consistent basis let us  know.    Rash  Past history - patch testing Positive to balsam of [place], imidazolidinyl urea, and 4-phenylenediamine base (done by derm) and off Dupixent  now.  Interim history - increased itching and red little bumps on face and neck.  Current rash does not resemble typical eczema. Continue proper skin care. Start medrol  pak. If you notice significant improvement in your rash within a few days let me know. Keep track of rashes and take pictures. Keep dermatology appointment in June.  I'm going to email you a safe list.  Make sure you use items from the safelist. Consider patch testing at our office in the future - we will be having the NAC-80 patches available in 2025.  Don't use the honey cream. Take hydroxyzine  25mg  1 hour before bedtime as needed for itching.  Assessment and Plan  No follow-ups on file.  No orders of the defined types were placed in this encounter.  Lab Orders  No laboratory test(s) ordered today    Diagnostics: Spirometry:  Tracings reviewed. Tricia Potts effort: {Blank single:19197::"Good reproducible efforts.","It was hard to get consistent efforts and there is a question as to whether this reflects a maximal maneuver.","Poor effort, data can not be interpreted."} FVC: ***L FEV1:  ***L, ***% predicted FEV1/FVC ratio: ***% Interpretation: {Blank single:19197::"Spirometry consistent with mild obstructive disease","Spirometry consistent with moderate obstructive disease","Spirometry consistent with severe obstructive disease","Spirometry consistent with possible restrictive disease","Spirometry consistent with mixed obstructive and restrictive disease","Spirometry uninterpretable due to technique","Spirometry consistent with normal pattern","No overt abnormalities noted given today's efforts"}.  Please see scanned spirometry results for details.  Skin Testing: {Blank single:19197::"Select foods","Environmental allergy  panel","Environmental allergy  panel and select foods","Food allergy  panel","None","Deferred due to recent antihistamines use"}. *** Results discussed with patient/family.   Medication List:  Current Outpatient Medications  Medication Sig Dispense Refill  . albuterol  (VENTOLIN  HFA) 108 (90 Base) MCG/ACT inhaler Inhale 2 puffs into the lungs every 6 (six) hours as needed for wheezing or shortness of breath. 18 g 2  . Albuterol -Budesonide  (AIRSUPRA ) 90-80 MCG/ACT AERO Inhale 2 puffs into the lungs every 4 (four) hours as needed (coughing, wheezing, chest tightness). Do not exceed 12 puffs in 24 hours. 10.7 g 2  . ALPRAZolam  (XANAX ) 0.25 MG tablet Take 1 tablet by mouth 2 (two) times daily as needed for anxiety. 10 tablet 0  . atorvastatin  (LIPITOR) 20 MG tablet Take 1 tablet (20 mg) by mouth daily x 5 days a week and take 2 tablets (40 mg) daily the other 2 days 120 tablet 1  . Azelaic Acid  15 % gel After skin is thoroughly washed and patted dry, gently but thoroughly massage a thin film of azelaic acid  cream into the affected area twice daily, in the morning and evening. With metronidazole/ivermectin 1%/1% 30 g 0  . Brimonidine  Tartrate (LUMIFY ) 0.025 % SOLN Place 1 drop into both eyes 2 (two) times daily as needed (eye irritation.). 7.5 mL 5  . calcium  carbonate  (OSCAL) 1500 (600 Ca) MG TABS tablet Take 600 mg of elemental calcium  by mouth daily with breakfast.    . Cholecalciferol  (VITAMIN D ) 50 MCG (2000 UT) CAPS Take 1 capsule (2,000 Units total) by mouth in the morning. 30 capsule 5  . clindamycin  (CLEOCIN  T) 1 % external solution Apply a small amount to skin 2 - 3 times a week for acne (Patient taking differently: Apply 1 Application topically daily as needed (acne).) 60 mL 5  . Dermatological Products, Misc. [facility]) lotion Apply 1 Application topically in the morning and at bedtime.    . EPINEPHrine  0.3 mg/0.3 mL IJ SOAJ injection Inject 0.3 mg into the muscle as needed for anaphylaxis. 2 each 1  . estradiol  (CLIMARA  - DOSED IN MG/24 HR) 0.025 mg/24hr patch Place 1 patch (0.025 mg total) onto the skin once a week. 12 patch 1  . famotidine  (PEPCID ) 40 MG tablet Take 1 tablet (40 mg total) by mouth at bedtime. 90 tablet 1  . hydrocortisone  2.5 % ointment Apply 1 Application topically 2 (two) times daily as needed. 28.35 g 2  . hydroxypropyl methylcellulose / hypromellose (ISOPTO TEARS / GONIOVISC) 2.5 % ophthalmic solution Place 1 drop into both eyes 3 (three) times daily as needed for dry eyes.    . hydrOXYzine  (ATARAX ) 25 MG tablet Take 1 tablet (25 mg total) by mouth at bedtime as needed for itching. 30  tablet 3  . ibuprofen (ADVIL,MOTRIN) 200 MG tablet Take 400-800 mg by mouth every 6 (six) hours as needed for headache or mild pain.    . medroxyPROGESTERone  (PROVERA ) 5 MG tablet Take 1 tablet by mouth daily x 10 days each month 30 tablet 1  . minocycline  (MINOCIN ) 50 MG capsule Take 1 capsule (50 mg total) by mouth 2 (two) times daily. 30 capsule 1  . montelukast  (SINGULAIR ) 10 MG tablet Take 1 tablet (10 mg total) by mouth at bedtime as needed. 90 tablet 3  . Multiple Vitamin (MULTI-VITAMIN DAILY PO) Take 1 tablet by mouth in the morning.    . naphazoline-pheniramine (ALLERGY  EYE) 0.025-0.3 % ophthalmic solution Place 1 drop into both eyes 3  (three) times daily as needed for eye irritation or allergies.    . Niacinamide-Tacrolimus 4-0.1 % CREA Apply 1 Dose topically 2 (two) times daily as needed.    . Olopatadine -Mometasone  (RYALTRIS ) 665-25 MCG/ACT SUSP Place 1-2 sprays into the nose in the morning and at bedtime. 29 g 5  . venlafaxine  XR (EFFEXOR  XR) 37.5 MG 24 hr capsule Take 1 capsule (37.5 mg total) by mouth daily with breakfast. 90 capsule 1   No current facility-administered medications for this visit.   Allergies: No Known Allergies I reviewed Tricia Potts past medical history, social history, family history, and environmental history and no significant changes have been reported from Tricia Potts previous visit.  Review of Systems  Constitutional:  Negative for appetite change, chills, fever and unexpected weight change.  HENT:  Negative for congestion, postnasal drip, rhinorrhea and sneezing.   Eyes:  Negative for itching.  Respiratory:  Negative for cough, chest tightness, shortness of breath and wheezing.   Cardiovascular:  Negative for chest pain.  Gastrointestinal:  Negative for abdominal pain.  Genitourinary:  Negative for difficulty urinating.  Skin:  Positive for rash.  Allergic/Immunologic: Positive for environmental allergies.  Neurological:  Negative for headaches.   Objective: LMP  (LMP Unknown)  There is no height or weight on file to calculate BMI. Physical Exam Vitals and nursing note reviewed.  Constitutional:      Appearance: Normal appearance. She is well-developed.  HENT:     Head: Normocephalic and atraumatic.     Right Ear: Tympanic membrane and external ear normal.     Left Ear: Tympanic membrane and external ear normal.     Nose: Nose normal.     Mouth/Throat:     Mouth: Mucous membranes are moist.     Pharynx: Oropharynx is clear.  Eyes:     Conjunctiva/sclera: Conjunctivae normal.  Cardiovascular:     Rate and Rhythm: Normal rate and regular rhythm.     Heart sounds: Normal heart sounds. No  murmur heard.    No friction rub. No gallop.  Pulmonary:     Effort: Pulmonary effort is normal.     Breath sounds: Normal breath sounds. No wheezing, rhonchi or rales.  Musculoskeletal:     Cervical back: Neck supple.  Skin:    General: Skin is warm.     Findings: Rash present.     Comments: Erythematous hue on the face. Dry skin with excoriations on upper back. Scattered papular rash on hairline.   Neurological:     Mental Status: She is alert and oriented to person, place, and time.  Psychiatric:        Behavior: Behavior normal.  Previous notes and tests were reviewed. The plan was reviewed with the patient/family, and all questions/concerned were addressed.  It was my pleasure to see Tricia Potts today and participate in Tricia Potts care. Please feel free to contact me with any questions or concerns.  Sincerely,  Eudelia Hero, DO Allergy  & Immunology  [facility]  [place] office: [phone] [facility] office: [phone]

## 2024-05-25 ENCOUNTER — Other Ambulatory Visit (HOSPITAL_COMMUNITY): Payer: Self-pay

## 2024-05-25 ENCOUNTER — Encounter: Payer: Self-pay | Admitting: Allergy

## 2024-05-25 ENCOUNTER — Ambulatory Visit: Payer: 59 | Admitting: Allergy

## 2024-05-25 ENCOUNTER — Ambulatory Visit: Payer: 59 | Admitting: Dermatology

## 2024-05-25 ENCOUNTER — Encounter: Payer: Self-pay | Admitting: Dermatology

## 2024-05-25 ENCOUNTER — Other Ambulatory Visit: Payer: Self-pay

## 2024-05-25 VITALS — BP 131/89

## 2024-05-25 VITALS — BP 134/82 | HR 72 | Temp 98.1°F | Resp 18 | Ht 63.0 in | Wt 167.9 lb

## 2024-05-25 DIAGNOSIS — J4520 Mild intermittent asthma, uncomplicated: Secondary | ICD-10-CM

## 2024-05-25 DIAGNOSIS — J3081 Allergic rhinitis due to animal (cat) (dog) hair and dander: Secondary | ICD-10-CM

## 2024-05-25 DIAGNOSIS — L309 Dermatitis, unspecified: Secondary | ICD-10-CM

## 2024-05-25 DIAGNOSIS — X32XXXA Exposure to sunlight, initial encounter: Secondary | ICD-10-CM

## 2024-05-25 DIAGNOSIS — L568 Other specified acute skin changes due to ultraviolet radiation: Secondary | ICD-10-CM | POA: Diagnosis not present

## 2024-05-25 DIAGNOSIS — B079 Viral wart, unspecified: Secondary | ICD-10-CM

## 2024-05-25 DIAGNOSIS — J301 Allergic rhinitis due to pollen: Secondary | ICD-10-CM | POA: Diagnosis not present

## 2024-05-25 DIAGNOSIS — R21 Rash and other nonspecific skin eruption: Secondary | ICD-10-CM

## 2024-05-25 DIAGNOSIS — L219 Seborrheic dermatitis, unspecified: Secondary | ICD-10-CM

## 2024-05-25 DIAGNOSIS — J309 Allergic rhinitis, unspecified: Secondary | ICD-10-CM

## 2024-05-25 DIAGNOSIS — L729 Follicular cyst of the skin and subcutaneous tissue, unspecified: Secondary | ICD-10-CM

## 2024-05-25 DIAGNOSIS — L720 Epidermal cyst: Secondary | ICD-10-CM | POA: Diagnosis not present

## 2024-05-25 DIAGNOSIS — J3089 Other allergic rhinitis: Secondary | ICD-10-CM | POA: Diagnosis not present

## 2024-05-25 DIAGNOSIS — H1013 Acute atopic conjunctivitis, bilateral: Secondary | ICD-10-CM

## 2024-05-25 MED ORDER — CLOBETASOL PROPIONATE 0.05 % EX SOLN
1.0000 | Freq: Two times a day (BID) | CUTANEOUS | 2 refills | Status: AC
  Filled 2024-05-25: qty 50, 15d supply, fill #0

## 2024-05-25 NOTE — Patient Instructions (Addendum)
 Environmental allergies 2023 skin testing positive to grass, weed, ragweed, trees, cat, dog, mold. Continue environmental control measures. Use over the counter antihistamines such as Zyrtec (cetirizine), Claritin (loratadine), Allegra  (fexofenadine ), or Xyzal (levocetirizine) daily as needed. May take twice a day during allergy  flares. May switch antihistamines every few months. May take twice a day the of injections and the day after injections.  Continue Singulair  (montelukast ) 10mg  daily at night. Continue Ryaltris  (olopatadine  + mometasone  nasal spray combination) 1-2 sprays per nostril twice a day.  Nasal saline spray (i.e., Simply Saline) or nasal saline lavage (i.e., NeilMed) is recommended as needed and prior to medicated nasal sprays. May use over the counter eye drops as needed. Continue allergy  injections - give red 0.05cc today. Come next week for red 0.1cc. Freeze at red 0.1cc every 2 weeks. Will plan on increasing the dose in November.  Asthma May use Airsupra  rescue inhaler 2 puffs every 4 to 6 hours as needed for shortness of breath, chest tightness, coughing, and wheezing. Do not use more than 12 puffs in 24 hours. May use Airsupra  rescue inhaler 2 puffs 5 to 15 minutes prior to strenuous physical activities. Rinse mouth after each use.  Monitor frequency of use - if you need to use it more than twice per week on a consistent basis let us  know.  Breathing control goals:  Full participation in all desired activities (may need albuterol  before activity) Albuterol  use two times or less a week on average (not counting use with activity) Cough interfering with sleep two times or less a month Oral steroids no more than once a year No hospitalizations   Skin  Continue proper skin care. Keep track of rashes and take pictures. Continue follow up with dermatology.  Take hydroxyzine  25mg  1 hour before bedtime as needed for itching.   Follow up in 5 months or sooner if needed.    Skin care recommendations  Bath time: Always use lukewarm water. AVOID very hot or cold water. Keep bathing time to 5-10 minutes. Do NOT use bubble bath. Use a mild soap and use just enough to wash the dirty areas. Do NOT scrub skin vigorously.  After bathing, pat dry your skin with a towel. Do NOT rub or scrub the skin.  Moisturizers and prescriptions:  ALWAYS apply moisturizers immediately after bathing (within 3 minutes). This helps to lock-in moisture. Use the moisturizer several times a day over the whole body. Good summer moisturizers include: Aveeno, CeraVe, Cetaphil. Good winter moisturizers include: Aquaphor, Vaseline, Cerave, Cetaphil, Eucerin, Vanicream. When using moisturizers along with medications, the moisturizer should be applied about one hour after applying the medication to prevent diluting effect of the medication or moisturize around where you applied the medications. When not using medications, the moisturizer can be continued twice daily as maintenance.  Laundry and clothing: Avoid laundry products with added color or perfumes. Use unscented hypo-allergenic laundry products such as Tide free, Cheer free & gentle, and All free and clear.  If the skin still seems dry or sensitive, you can try double-rinsing the clothes. Avoid tight or scratchy clothing such as wool. Do not use fabric softeners or dyer sheets.

## 2024-05-25 NOTE — Progress Notes (Signed)
 New Patient Visit   Subjective  Tricia Potts is a 53 y.o. female who presents for the following: New Pt - Skin Concerns   Patient states she has multiple warts on her R hand, B/L foot and a cyst on her labia that she would like evaluated. Pt stated that the warts on have been there about 3 years with some being treated with cryo. The cyst on her labia has been present for 1 year and has been seen by GYN in regard. She does not shave this area but she did get waxed a few weeks before it came up. Patient reports she has previously been treated for these areas by [facility]. She also had some itching of her chest and back while she was in the [place]. She used some cortisone cream. She has had patch testing with Dr Martina Sledge in the past and had several allergies as well as an allergy  to the adhesive from the patches. Patient denied Hx of bx. Patient denied family history of skin cancer(s).   The following portions of the chart were reviewed this encounter and updated as appropriate: medications, allergies, medical history  Review of Systems:  No other skin or systemic complaints except as noted in HPI or Assessment and Plan.  Objective  Well appearing patient in no apparent distress; mood and affect are within normal limits.   A focused examination was performed of the following areas: hands, feet, groin   Relevant exam findings are noted in the Assessment and Plan.            Bilateral feet, right hand (17) Verrucous papules   Assessment & Plan   1. Epidermal inclusion cyst - Assessment:  Patient presents with a 4-5 mm non-painful cystic nodule in the superior aspect of the left labia majora. The cyst appears to be a sebaceous cyst which is benign in nature. It has been present for approximately a year and seems to be growing slightly. The patient reports no pain unless manipulated and no discharge. Given its location and the patient's tendency to manipulate it,  surgical removal is being considered despite its small size.  - Plan:    Verbally Consulted with Dr. Pasi regarding potential surgical removal of the cyst and she's agreed to remove it    Advise patient against self-manipulation to avoid introducing bacteria and potential abscess formation  2. Verruca vulgaris (warts) - Assessment:  Patient presents with multiple warts on bilateral feet, greater than 16 in number. Patient has a history of wart removal in the past. The warts appear to be typical verruca vulgaris lesions.  - Plan:    Perform cryotherapy on visible warts today    Recommend daily zinc supplementation to boost innate immune system    Prescribe cimetidine (Tagamet) to help clear wart virus faster     - Continue for 6 weeks after warts are visually gone    Advise patient to keep hands moisturized to maintain skin barrier    Follow up in 8 weeks for potential touch-up treatment  3. Photodermatitis - Assessment:  Patient reports recent episode of intense itching and skin irritation on chest and back of neck after sun exposure in the [place]. The reaction improved with over-the-counter cortisone. Patient also notes ongoing flaky and red areas on face. Given the patient's history of multiple skin sensitivities and possible eczema, this could represent a photodermatitis or exacerbation of underlying eczema.  - Plan:    Prescribe triamcinolone  for use up  to twice daily for 2 weeks in affected areas if symptoms recur    Recommend use of mineral sunscreens (zinc oxide, titanium dioxide) for sun protection    Suggest Avne Tolerance line of skincare products due to lack of preservatives    Recommend Vanicream products, including their mineral sunscreen, for sensitive skin  4. Seborrheic dermatitis - Assessment:  Patient reports intermittent scalp itching, which is likely seborrheic dermatitis given the absence of visible psoriasis and the fluctuating nature of symptoms.  -  Plan:    Prescribe clobetasol solution for use up to twice daily as needed for scalp itching    Recommend switching to Seen shampoo and conditioner to minimize potential for breakouts   VIRAL WARTS, UNSPECIFIED TYPE (17) Bilateral feet, right hand (17) Destruction of lesion - Bilateral feet, right hand (17) Complexity: simple   Destruction method: cryotherapy   Informed consent: discussed and consent obtained   Timeout:  patient name, date of birth, surgical site, and procedure verified Lesion destroyed using liquid nitrogen: Yes   Region frozen until ice ball extended beyond lesion: Yes   Outcome: patient tolerated procedure well with no complications   Post-procedure details: wound care instructions given    SEBORRHEIC DERMATITIS Exam:    Seborrheic Dermatitis is a chronic persistent rash characterized by pinkness and scaling most commonly of the mid face but also can occur on the scalp (dandruff), ears; mid chest, mid back and groin.  It tends to be exacerbated by stress and cooler weather.  People who have neurologic disease may experience new onset or exacerbation of existing seborrheic dermatitis.  The condition is not curable but treatable and can be controlled.  Treatment Plan: Clobetasol solution twice daily as needed for itch.  Recommend Zinc shampoo - Cerave sample given today.   IRRITANT CONTACT DERMATITIS Exam:    Treatment Plan: Triamcinolone  0.1% cream twice daily to affected areas of rash x 2 weeks as needed -patient has TMC at home.  Recommend Avene Tolerance line. Recommend mineral sunscreens.  Return in about 8 weeks (around 07/20/2024) for Wart Follow up.    Documentation: I have reviewed the above documentation for accuracy and completeness, and I agree with the above.  I, Shirron Louanne Roussel, CMA, am acting as scribe for [employer], DO.   Louana Roup, DO

## 2024-05-25 NOTE — Patient Instructions (Addendum)
 Date: Mon May 25 2024  Hello Tricia Potts,  Thank you for visiting today. Here is a summary of the key instructions:  - Medications:   - Take zinc supplement daily to boost your immune system   - Switch from Pepcid  to Tagamet (cimetidine) until warts are gone, then continue for 6 weeks after   - Use clobetasol solution on scalp when itchy, up to twice daily  - Skin Care:   - Use triamcinolone  cream for skin irritation, twice daily for up to 2 weeks   - Try Avne Tolerance line products for sensitive skin   - Use mineral sunscreens with zinc oxide or titanium dioxide   - Try Vanicream, Sunbum Spray Mineral, or Aveeno Sensitive Skin sunscreens  - Wart Treatment:   - Avoid shaving over warts to prevent spreading   - Keep hands moisturized to prevent wart spread  - Follow-up:   - Return in 8 weeks for wart check and possible treatment  We look forward to seeing you at your next visit. If you have any questions or concerns before then, please do not hesitate to contact our office.  Warm regards,  Dr. Louana Roup, Dermatology     Cryotherapy Aftercare  Wash gently with soap and water everyday.   Apply Vaseline or Aquaphor daily until healed.           Important Information  Due to recent changes in healthcare laws, you may see results of your pathology and/or laboratory studies on MyChart before the doctors have had a chance to review them. We understand that in some cases there may be results that are confusing or concerning to you. Please understand that not all results are received at the same time and often the doctors may need to interpret multiple results in order to provide you with the best plan of care or course of treatment. Therefore, we ask that you please give us  2 business days to thoroughly review all your results before contacting the office for clarification. Should we see a critical lab result, you will be contacted sooner.   If You Need Anything After Your  Visit  If you have any questions or concerns for your doctor, please call our main line at [phone] If no one answers, please leave a voicemail as directed and we will return your call as soon as possible. Messages left after 4 pm will be answered the following business day.   You may also send us  a message via MyChart. We typically respond to MyChart messages within 1-2 business days.  For prescription refills, please ask your pharmacy to contact our office. Our fax number is [phone].  If you have an urgent issue when the clinic is closed that cannot wait until the next business day, you can page your doctor at the number below.    Please note that while we do our best to be available for urgent issues outside of office hours, we are not available 24/7.   If you have an urgent issue and are unable to reach us , you may choose to seek medical care at your doctor's office, retail clinic, urgent care center, or emergency room.  If you have a medical emergency, please immediately call 911 or go to the emergency department. In the event of inclement weather, please call our main line at [phone] for an update on the status of any delays or closures.  Dermatology Medication Tips: Please keep the boxes that topical medications come in in order to help  keep track of the instructions about where and how to use these. Pharmacies typically print the medication instructions only on the boxes and not directly on the medication tubes.   If your medication is too expensive, please contact our office at [phone] or send us  a message through MyChart.   We are unable to tell what your co-pay for medications will be in advance as this is different depending on your insurance coverage. However, we may be able to find a substitute medication at lower cost or fill out paperwork to get insurance to cover a needed medication.   If a prior authorization is required to get your medication covered by  your insurance company, please allow us  1-2 business days to complete this process.  Drug prices often vary depending on where the prescription is filled and some pharmacies may offer cheaper prices.  The website www.goodrx.com contains coupons for medications through different pharmacies. The prices here do not account for what the cost may be with help from insurance (it may be cheaper with your insurance), but the website can give you the price if you did not use any insurance.  - You can print the associated coupon and take it with your prescription to the pharmacy.  - You may also stop by our office during regular business hours and pick up a GoodRx coupon card.  - If you need your prescription sent electronically to a different pharmacy, notify our office through [facility] or by phone at [phone]

## 2024-05-28 ENCOUNTER — Encounter: Payer: Self-pay | Admitting: Family Medicine

## 2024-06-04 ENCOUNTER — Ambulatory Visit (INDEPENDENT_AMBULATORY_CARE_PROVIDER_SITE_OTHER): Payer: Self-pay

## 2024-06-04 DIAGNOSIS — J309 Allergic rhinitis, unspecified: Secondary | ICD-10-CM | POA: Diagnosis not present

## 2024-06-23 ENCOUNTER — Ambulatory Visit (INDEPENDENT_AMBULATORY_CARE_PROVIDER_SITE_OTHER): Payer: Self-pay

## 2024-06-23 DIAGNOSIS — J309 Allergic rhinitis, unspecified: Secondary | ICD-10-CM | POA: Diagnosis not present

## 2024-06-29 ENCOUNTER — Encounter: Payer: Self-pay | Admitting: Family Medicine

## 2024-07-06 ENCOUNTER — Other Ambulatory Visit (HOSPITAL_COMMUNITY): Payer: Self-pay

## 2024-07-07 ENCOUNTER — Ambulatory Visit (INDEPENDENT_AMBULATORY_CARE_PROVIDER_SITE_OTHER)

## 2024-07-07 DIAGNOSIS — J309 Allergic rhinitis, unspecified: Secondary | ICD-10-CM

## 2024-07-09 ENCOUNTER — Encounter: Payer: Self-pay | Admitting: Dermatology

## 2024-07-15 ENCOUNTER — Other Ambulatory Visit (HOSPITAL_COMMUNITY): Payer: Self-pay

## 2024-07-15 ENCOUNTER — Ambulatory Visit: Admitting: Dermatology

## 2024-07-15 ENCOUNTER — Encounter: Payer: Self-pay | Admitting: Dermatology

## 2024-07-15 VITALS — BP 125/79 | HR 63 | Temp 98.4°F

## 2024-07-15 DIAGNOSIS — L720 Epidermal cyst: Secondary | ICD-10-CM | POA: Diagnosis not present

## 2024-07-15 DIAGNOSIS — D485 Neoplasm of uncertain behavior of skin: Secondary | ICD-10-CM

## 2024-07-15 DIAGNOSIS — D492 Neoplasm of unspecified behavior of bone, soft tissue, and skin: Secondary | ICD-10-CM

## 2024-07-15 MED ORDER — MUPIROCIN 2 % EX OINT
1.0000 | TOPICAL_OINTMENT | Freq: Two times a day (BID) | CUTANEOUS | 3 refills | Status: AC
  Filled 2024-07-15: qty 22, 11d supply, fill #0

## 2024-07-15 NOTE — Patient Instructions (Signed)

## 2024-07-15 NOTE — Progress Notes (Signed)
   Follow-Up Visit   Subjective  Tricia Potts is a 53 y.o. 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 Alm.   The following portions of the chart were reviewed this encounter and updated as appropriate: medications, allergies, medical history  Review of Systems:  No other skin or systemic complaints except as noted in HPI or Assessment and Plan.  Objective  Well appearing patient in no apparent distress; mood and affect are within normal limits.  A focused examination was performed of the following areas: Left labia majora Relevant physical exam findings are noted in the Assessment and Plan.     Assessment & Plan   NEOPLASM OF UNCERTAIN BEHAVIOR OF SKIN left labia majora Skin excision  Excision method:  elliptical Lesion length (cm):  1 Lesion width (cm):  0.6 Margin per side (cm):  0.1 Total excision diameter (cm):  1.2 Informed consent: discussed and consent obtained   Timeout: patient name, date of birth, surgical site, and procedure verified   Procedure prep:  Patient was prepped and draped in usual sterile fashion Prep type:  Chlorhexidine  Anesthesia: the lesion was anesthetized in a standard fashion   Anesthetic:  1% lidocaine  w/ epinephrine  1-100,000 buffered w/ 8.4% NaHCO3 Instrument used: #15 blade   Hemostasis achieved with: suture, pressure and electrodesiccation   Outcome: patient tolerated procedure well with no complications   Post-procedure details: sterile dressing applied and wound care instructions given   Dressing type: bandage, pressure dressing and petrolatum    Specimen 1 - Surgical pathology Differential Diagnosis: R/O cyst vs lipoma vs other  Check Margins: No   Return in about 4 weeks (around 08/12/2024) for Follow up for second intention .  I, Berwyn Lesches, Surg Tech III, am acting as scribe for RUFUS CHRISTELLA HOLY, MD.   Documentation: I have reviewed the above documentation for  accuracy and completeness, and I agree with the above.  RUFUS CHRISTELLA HOLY, MD

## 2024-07-16 LAB — SURGICAL PATHOLOGY

## 2024-07-18 ENCOUNTER — Ambulatory Visit: Payer: Self-pay | Admitting: Dermatology

## 2024-07-20 ENCOUNTER — Ambulatory Visit: Admitting: Dermatology

## 2024-07-21 ENCOUNTER — Other Ambulatory Visit (HOSPITAL_COMMUNITY): Payer: Self-pay

## 2024-07-28 ENCOUNTER — Ambulatory Visit (INDEPENDENT_AMBULATORY_CARE_PROVIDER_SITE_OTHER)

## 2024-07-28 DIAGNOSIS — J309 Allergic rhinitis, unspecified: Secondary | ICD-10-CM | POA: Diagnosis not present

## 2024-08-11 ENCOUNTER — Other Ambulatory Visit (HOSPITAL_COMMUNITY): Payer: Self-pay

## 2024-08-19 ENCOUNTER — Other Ambulatory Visit (HOSPITAL_COMMUNITY): Payer: Self-pay

## 2024-08-28 ENCOUNTER — Other Ambulatory Visit (HOSPITAL_COMMUNITY): Payer: Self-pay

## 2024-09-01 ENCOUNTER — Other Ambulatory Visit (HOSPITAL_COMMUNITY): Payer: Self-pay

## 2024-09-03 ENCOUNTER — Other Ambulatory Visit (HOSPITAL_COMMUNITY): Payer: Self-pay

## 2024-09-07 ENCOUNTER — Other Ambulatory Visit (HOSPITAL_COMMUNITY): Payer: Self-pay

## 2024-09-08 DIAGNOSIS — J301 Allergic rhinitis due to pollen: Secondary | ICD-10-CM

## 2024-09-08 DIAGNOSIS — J302 Other seasonal allergic rhinitis: Secondary | ICD-10-CM

## 2024-09-08 DIAGNOSIS — J3081 Allergic rhinitis due to animal (cat) (dog) hair and dander: Secondary | ICD-10-CM

## 2024-09-08 NOTE — Progress Notes (Signed)
 VIALS MADE 09-08-24

## 2024-09-09 ENCOUNTER — Other Ambulatory Visit (HOSPITAL_COMMUNITY): Payer: Self-pay

## 2024-09-09 ENCOUNTER — Encounter (HOSPITAL_COMMUNITY): Payer: Self-pay

## 2024-09-30 ENCOUNTER — Other Ambulatory Visit (HOSPITAL_COMMUNITY): Payer: Self-pay

## 2024-09-30 ENCOUNTER — Encounter: Payer: Self-pay | Admitting: Family Medicine

## 2024-09-30 ENCOUNTER — Other Ambulatory Visit: Payer: Self-pay | Admitting: Family

## 2024-09-30 MED ORDER — ATORVASTATIN CALCIUM 20 MG PO TABS
ORAL_TABLET | ORAL | 1 refills | Status: DC
  Filled 2024-09-30: qty 120, 90d supply, fill #0

## 2024-10-01 ENCOUNTER — Other Ambulatory Visit (HOSPITAL_COMMUNITY): Payer: Self-pay

## 2024-10-01 ENCOUNTER — Encounter: Payer: 59 | Admitting: Family Medicine

## 2024-10-01 MED ORDER — ATORVASTATIN CALCIUM 20 MG PO TABS: ORAL_TABLET | ORAL | 1 refills | Status: AC

## 2024-10-26 ENCOUNTER — Ambulatory Visit: Admitting: Allergy

## 2024-10-29 ENCOUNTER — Encounter: Payer: Self-pay | Admitting: Family Medicine

## 2024-10-30 ENCOUNTER — Other Ambulatory Visit: Payer: Self-pay | Admitting: Family

## 2024-10-30 MED ORDER — VENLAFAXINE HCL ER 37.5 MG PO CP24: 37.5000 mg | ORAL_CAPSULE | Freq: Every day | ORAL | 2 refills | Status: DC

## 2025-01-18 ENCOUNTER — Other Ambulatory Visit: Payer: Self-pay

## 2025-01-25 ENCOUNTER — Other Ambulatory Visit: Payer: Self-pay | Admitting: Family
